# Patient Record
Sex: Female | Born: 1980
Health system: Southern US, Community
[De-identification: ages and names within clinical notes are randomized; demographics above are authoritative.]

## PROBLEM LIST (undated history)

## (undated) ENCOUNTER — Inpatient Hospital Stay (HOSPITAL_COMMUNITY): Payer: Self-pay

## (undated) DIAGNOSIS — B1920 Unspecified viral hepatitis C without hepatic coma: Secondary | ICD-10-CM

## (undated) DIAGNOSIS — O09529 Supervision of elderly multigravida, unspecified trimester: Secondary | ICD-10-CM

## (undated) DIAGNOSIS — J45909 Unspecified asthma, uncomplicated: Secondary | ICD-10-CM

## (undated) DIAGNOSIS — A4902 Methicillin resistant Staphylococcus aureus infection, unspecified site: Secondary | ICD-10-CM

## (undated) DIAGNOSIS — Z954 Presence of other heart-valve replacement: Secondary | ICD-10-CM

## (undated) DIAGNOSIS — S065X9A Traumatic subdural hemorrhage with loss of consciousness of unspecified duration, initial encounter: Secondary | ICD-10-CM

## (undated) DIAGNOSIS — D649 Anemia, unspecified: Secondary | ICD-10-CM

## (undated) DIAGNOSIS — Z3009 Encounter for other general counseling and advice on contraception: Secondary | ICD-10-CM

## (undated) DIAGNOSIS — F1990 Other psychoactive substance use, unspecified, uncomplicated: Secondary | ICD-10-CM

## (undated) DIAGNOSIS — T4145XA Adverse effect of unspecified anesthetic, initial encounter: Secondary | ICD-10-CM

## (undated) DIAGNOSIS — F1120 Opioid dependence, uncomplicated: Secondary | ICD-10-CM

## (undated) HISTORY — DX: Unspecified viral hepatitis C without hepatic coma: B19.20

## (undated) HISTORY — DX: Adverse effect of unspecified anesthetic, initial encounter: T41.45XA

## (undated) HISTORY — PX: CARDIAC SURGERY: SHX584

## (undated) HISTORY — PX: BUNIONECTOMY: SHX129

## (undated) HISTORY — PX: TUBAL LIGATION: SHX77

## (undated) HISTORY — DX: Presence of other heart-valve replacement: Z95.4

## (undated) HISTORY — DX: Methicillin resistant Staphylococcus aureus infection, unspecified site: A49.02

## (undated) HISTORY — DX: Other psychoactive substance use, unspecified, uncomplicated: F19.90

## (undated) HISTORY — DX: Traumatic subdural hemorrhage with loss of consciousness of unspecified duration, initial encounter: S06.5X9A

---
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
## 2004-10-28 ENCOUNTER — Emergency Department: Payer: Self-pay | Admitting: General Practice

## 2005-07-30 ENCOUNTER — Emergency Department: Payer: Self-pay | Admitting: Emergency Medicine

## 2005-08-05 ENCOUNTER — Emergency Department: Payer: Self-pay | Admitting: Emergency Medicine

## 2008-03-17 ENCOUNTER — Emergency Department: Payer: Self-pay | Admitting: Emergency Medicine

## 2008-06-14 ENCOUNTER — Emergency Department: Payer: Self-pay | Admitting: Emergency Medicine

## 2008-06-19 ENCOUNTER — Ambulatory Visit: Payer: Self-pay | Admitting: Unknown Physician Specialty

## 2008-06-25 ENCOUNTER — Ambulatory Visit: Payer: Self-pay | Admitting: Unknown Physician Specialty

## 2008-11-20 ENCOUNTER — Emergency Department: Payer: Self-pay | Admitting: Emergency Medicine

## 2008-12-18 ENCOUNTER — Emergency Department: Payer: Self-pay | Admitting: Emergency Medicine

## 2009-06-15 ENCOUNTER — Emergency Department: Payer: Self-pay | Admitting: Emergency Medicine

## 2009-06-25 ENCOUNTER — Emergency Department: Payer: Self-pay | Admitting: Emergency Medicine

## 2010-10-26 ENCOUNTER — Emergency Department: Payer: Self-pay | Admitting: Internal Medicine

## 2011-02-06 ENCOUNTER — Inpatient Hospital Stay: Payer: Self-pay | Admitting: Psychiatry

## 2011-04-26 ENCOUNTER — Observation Stay: Payer: Self-pay | Admitting: Obstetrics and Gynecology

## 2011-05-05 ENCOUNTER — Ambulatory Visit: Payer: Self-pay

## 2011-05-06 ENCOUNTER — Inpatient Hospital Stay: Payer: Self-pay

## 2011-07-19 ENCOUNTER — Emergency Department: Payer: Self-pay | Admitting: Emergency Medicine

## 2011-10-21 ENCOUNTER — Emergency Department: Payer: Self-pay | Admitting: Emergency Medicine

## 2012-06-06 ENCOUNTER — Emergency Department: Payer: Self-pay | Admitting: Emergency Medicine

## 2012-06-06 LAB — URINALYSIS, COMPLETE
Bacteria: NONE SEEN
Bilirubin,UR: NEGATIVE
Glucose,UR: NEGATIVE mg/dL (ref 0–75)
Ketone: NEGATIVE
Nitrite: NEGATIVE
Ph: 6 (ref 4.5–8.0)
Protein: 30
RBC,UR: 45 /HPF (ref 0–5)
Specific Gravity: 1.023 (ref 1.003–1.030)
Squamous Epithelial: 1
Transitional Epi: <1
WBC UR: 80 /HPF (ref 0–5)

## 2012-06-06 LAB — PREGNANCY, URINE: Pregnancy Test, Urine: NEGATIVE m[IU]/mL

## 2012-06-08 LAB — URINE CULTURE

## 2012-09-11 ENCOUNTER — Emergency Department: Payer: Self-pay | Admitting: Emergency Medicine

## 2012-10-01 ENCOUNTER — Emergency Department: Payer: Self-pay | Admitting: Emergency Medicine

## 2012-12-19 ENCOUNTER — Emergency Department: Payer: Self-pay | Admitting: Emergency Medicine

## 2013-02-16 ENCOUNTER — Emergency Department: Payer: Self-pay | Admitting: Internal Medicine

## 2013-02-16 LAB — URINALYSIS, COMPLETE
Bacteria: NONE SEEN
Bilirubin,UR: NEGATIVE
Glucose,UR: NEGATIVE mg/dL (ref 0–75)
Ketone: NEGATIVE
Leukocyte Esterase: NEGATIVE
Nitrite: NEGATIVE
Ph: 6 (ref 4.5–8.0)
Protein: 30
RBC,UR: 19 /HPF (ref 0–5)
Specific Gravity: 1.024 (ref 1.003–1.030)
Squamous Epithelial: 8
WBC UR: 1 /HPF (ref 0–5)

## 2013-02-16 LAB — CBC
HCT: 46.6 % (ref 35.0–47.0)
HGB: 15.6 g/dL (ref 12.0–16.0)
MCH: 32.9 pg (ref 26.0–34.0)
MCHC: 33.6 g/dL (ref 32.0–36.0)
MCV: 98 fL (ref 80–100)
Platelet: 253 10*3/uL (ref 150–440)
RBC: 4.74 10*6/uL (ref 3.80–5.20)
RDW: 13.9 % (ref 11.5–14.5)
WBC: 13.4 10*3/uL — ABNORMAL HIGH (ref 3.6–11.0)

## 2013-02-16 LAB — HCG, QUANTITATIVE, PREGNANCY: Beta Hcg, Quant.: 206 m[IU]/mL — ABNORMAL HIGH

## 2013-10-24 ENCOUNTER — Encounter (HOSPITAL_COMMUNITY): Payer: Self-pay | Admitting: Emergency Medicine

## 2013-10-24 ENCOUNTER — Emergency Department (INDEPENDENT_AMBULATORY_CARE_PROVIDER_SITE_OTHER)
Admission: EM | Admit: 2013-10-24 | Discharge: 2013-10-24 | Disposition: A | Discharge status: Home / Self Care | Payer: Self-pay | Source: Home / Self Care | Attending: Emergency Medicine | Admitting: Emergency Medicine

## 2013-10-24 DIAGNOSIS — L0291 Cutaneous abscess, unspecified: Secondary | ICD-10-CM

## 2013-10-24 DIAGNOSIS — L0390 Cellulitis, unspecified: Secondary | ICD-10-CM

## 2013-10-24 DIAGNOSIS — N390 Urinary tract infection, site not specified: Secondary | ICD-10-CM

## 2013-10-24 LAB — POCT URINALYSIS DIP (DEVICE)
Bilirubin Urine: NEGATIVE
Glucose, UA: NEGATIVE mg/dL
Ketones, ur: NEGATIVE mg/dL
Nitrite: NEGATIVE
Protein, ur: NEGATIVE mg/dL
Specific Gravity, Urine: 1.025 (ref 1.005–1.030)
Urobilinogen, UA: 0.2 mg/dL (ref 0.0–1.0)
pH: 6.5 (ref 5.0–8.0)

## 2013-10-24 MED ORDER — FLUCONAZOLE 150 MG PO TABS: 150.0000 mg | ORAL_TABLET | Freq: Once | ORAL | Status: DC

## 2013-10-24 MED ORDER — SULFAMETHOXAZOLE-TMP DS 800-160 MG PO TABS: 2.0000 | ORAL_TABLET | Freq: Two times a day (BID) | ORAL | Status: DC

## 2013-10-24 NOTE — ED Notes (Signed)
C/o uti symptoms x 2 days.  Pt is also concerned about a knot on above the right antecubital space.  States that 5 days ago she was injecting cocaine for the first time.  Area is red and swollen, hot to the touch.  Pain/tenderness radiates up arm.

## 2013-10-24 NOTE — Discharge Instructions (Signed)

## 2013-10-24 NOTE — ED Provider Notes (Signed)
Chief Complaint   Chief Complaint  Patient presents with  . Urinary Tract Infection  . Abscess    History of Present Illness   Tiffany Salazar is a 33 year old female who a week ago injected some cocaine into her right antecubital fossa. Ever since then there has been redness, swelling, and pain at the injection site. It has not drained any pus and she's not had any fever or chills. The patient states that she does not usually use cocaine and this was a one-time only thing for her. She has had issues with drug dependence however, and is on Suboxone. The second problem has been a two-day history of urinary frequency and dysuria. She denies any hematuria, back pain, or fever. She has had UTIs before. She denies any GYN complaints. She does not think she needs referral to a drug treatment program.  Review of Systems   Other than as noted above, the patient denies any of the following symptoms: Systemic:  No fever, chills or sweats. Skin:  No rash or itching.  PMFSH   Past medical history, family history, social history, meds, and allergies were reviewed.  No history of diabetes or prior history of abscesses or MRSA.   Physical Examination     Vital signs:  BP 134/69  Pulse 67  Temp(Src) 98.8 F (37.1 C) (Oral)  Resp 16  SpO2 100%  LMP 10/11/2013 Skin:  There is a 2 cm raised, red, tender, fluctuant mass in the right antecubital fossa.  Skin exam was otherwise normal.  No rash. Ext:  Distal pulses were full, patient has full ROM of all joints.   Labs   Results for orders placed during the hospital encounter of 10/24/13  POCT URINALYSIS DIP (DEVICE)      Result Value Ref Range   Glucose, UA NEGATIVE  NEGATIVE mg/dL   Bilirubin Urine NEGATIVE  NEGATIVE   Ketones, ur NEGATIVE  NEGATIVE mg/dL   Specific Gravity, Urine 1.025  1.005 - 1.030   Hgb urine dipstick TRACE (*) NEGATIVE   pH 6.5  5.0 - 8.0   Protein, ur NEGATIVE  NEGATIVE mg/dL   Urobilinogen, UA 0.2  0.0 - 1.0 mg/dL   Nitrite NEGATIVE  NEGATIVE   Leukocytes, UA SMALL (*) NEGATIVE   Urine was cultured.  Results are pending at this time and we will call about any positive results.  Procedure   Verbal informed consent was obtained.  The patient was informed of the risks and benefits of the procedure and understands and accepts.  A time out was called and the identity of the patient and correct procedure was confirmed.   The abscess area described above was prepped with Betadine and alcohol and anesthetized with ethyl chloride spray than 5 mL of 2% Xylocaine with epinephrine.  Using a #11 scalpel blade, a singe straight incision was made into the area of fluctulence, yielding a moderate amount of prurulent drainage.  Routine cultures were obtained.  Blunt dissection was used to break up loculations and the resulting wound cavity was packed with 1/4 inch Iodoform gauze.  A sterile pressure dressing was applied.  Assessment   The primary encounter diagnosis was Abscess. A diagnosis of UTI (lower urinary tract infection) was also pertinent to this visit.  Plan     1.  Meds:  The following meds were prescribed:   Discharge Medication List as of 10/24/2013  9:07 PM    START taking these medications   Details  fluconazole (DIFLUCAN) 150 MG tablet  Take 1 tablet (150 mg total) by mouth once., Starting 10/24/2013, Normal    sulfamethoxazole-trimethoprim (BACTRIM DS) 800-160 MG per tablet Take 2 tablets by mouth 2 (two) times daily., Starting 10/24/2013, Until Discontinued, Normal        2.  Patient Education/Counseling:  The patient was given appropriate handouts, self care instructions, and instructed in symptomatic relief.    3.  Follow up:  The patient was instructed to leave the dressing in place and return here again in 48 hours for packing removal, or sooner if becoming worse in any way, and given some red flag symptoms such as fever which would prompt immediate return.       Reuben Likes, MD 10/24/13  [id number]

## 2013-10-26 ENCOUNTER — Encounter (HOSPITAL_COMMUNITY): Payer: Self-pay | Admitting: Emergency Medicine

## 2013-10-26 ENCOUNTER — Emergency Department (INDEPENDENT_AMBULATORY_CARE_PROVIDER_SITE_OTHER)
Admission: EM | Admit: 2013-10-26 | Discharge: 2013-10-26 | Disposition: A | Discharge status: Home / Self Care | Payer: Self-pay | Source: Home / Self Care | Attending: Family Medicine | Admitting: Family Medicine

## 2013-10-26 DIAGNOSIS — L02419 Cutaneous abscess of limb, unspecified: Secondary | ICD-10-CM

## 2013-10-26 DIAGNOSIS — IMO0002 Reserved for concepts with insufficient information to code with codable children: Secondary | ICD-10-CM

## 2013-10-26 MED ORDER — IBUPROFEN 800 MG PO TABS
800.0000 mg | ORAL_TABLET | Freq: Once | ORAL | Status: AC
  Administered 2013-10-26: 800 mg via ORAL

## 2013-10-26 MED ORDER — IBUPROFEN 800 MG PO TABS
ORAL_TABLET | ORAL | Status: AC
  Filled 2013-10-26: qty 1

## 2013-10-26 NOTE — ED Provider Notes (Signed)
Tiffany Salazar is a 33 y.o. female who presents to Urgent Care today for abscess followup. Patient was seen on March 2 for right antecubital fossa abscess. This results in about one week after an attempted IV cocaine use. She was seen and had incision drainage packing and culture of the abscess. She was placed on empiric Bactrim antibiotics. She's here today notes that she feels somewhat better. No fevers or chills nausea vomiting or diarrhea. No further cocaine use.   History reviewed. No pertinent past medical history. History  Substance Use Topics  . Smoking status: Current Every Day Smoker -- 1.00 packs/day    Types: Cigarettes  . Smokeless tobacco: Not on file  . Alcohol Use: Yes   ROS as above Medications: No current facility-administered medications for this encounter.   Current Outpatient Prescriptions  Medication Sig Dispense Refill  . fluconazole (DIFLUCAN) 150 MG tablet Take 1 tablet (150 mg total) by mouth once.  1 tablet  0  . sulfamethoxazole-trimethoprim (BACTRIM DS) 800-160 MG per tablet Take 2 tablets by mouth 2 (two) times daily.  40 tablet  0    Exam:  BP 100/53  Pulse 70  Temp(Src) 97.7 F (36.5 C)  Resp 14  SpO2 100%  LMP 10/11/2013 Gen: Well NAD SKIN: Well appearing abscess with intact packing. Minimal surrounding erythema or induration. No expressible pus. The packing was removed. No streaking erythema.   Assessment and Plan: 33 y.o. female with followup abscess. Appears to be doing well. Not currently fully resolved. Plan to continue Bactrim antibiotics. Awaiting culture results. No growth to date.  Discussed warning signs or symptoms. Please see discharge instructions. Patient expresses understanding.    Rodolph BongEvan S Macarius Ruark, MD 10/26/13 [phone]

## 2013-10-26 NOTE — Discharge Instructions (Signed)
Thank you for coming in today. Continue the antibiotics until they run out. In 2 days if not better please return. If getting worse go to the emergency room. If getting dramatically better or you do not need to come back.  Please do not inject drugs into your body anymore.  Abscess An abscess is an infected area that contains a collection of pus and debris.It can occur in almost any part of the body. An abscess is also known as a furuncle or boil. CAUSES  An abscess occurs when tissue gets infected. This can occur from blockage of oil or sweat glands, infection of hair follicles, or a minor injury to the skin. As the body tries to fight the infection, pus collects in the area and creates pressure under the skin. This pressure causes pain. People with weakened immune systems have difficulty fighting infections and get certain abscesses more often.  SYMPTOMS Usually an abscess develops on the skin and becomes a painful mass that is red, warm, and tender. If the abscess forms under the skin, you may feel a moveable soft area under the skin. Some abscesses break open (rupture) on their own, but most will continue to get worse without care. The infection can spread deeper into the body and eventually into the bloodstream, causing you to feel ill.  DIAGNOSIS  Your caregiver will take your medical history and perform a physical exam. A sample of fluid may also be taken from the abscess to determine what is causing your infection. TREATMENT  Your caregiver may prescribe antibiotic medicines to fight the infection. However, taking antibiotics alone usually does not cure an abscess. Your caregiver may need to make a small cut (incision) in the abscess to drain the pus. In some cases, gauze is packed into the abscess to reduce pain and to continue draining the area. HOME CARE INSTRUCTIONS   Only take over-the-counter or prescription medicines for pain, discomfort, or fever as directed by your  caregiver.  If you were prescribed antibiotics, take them as directed. Finish them even if you start to feel better.  If gauze is used, follow your caregiver's directions for changing the gauze.  To avoid spreading the infection:  Keep your draining abscess covered with a bandage.  Wash your hands well.  Do not share personal care items, towels, or whirlpools with others.  Avoid skin contact with others.  Keep your skin and clothes clean around the abscess.  Keep all follow-up appointments as directed by your caregiver. SEEK MEDICAL CARE IF:   You have increased pain, swelling, redness, fluid drainage, or bleeding.  You have muscle aches, chills, or a general ill feeling.  You have a fever. MAKE SURE YOU:   Understand these instructions.  Will watch your condition.  Will get help right away if you are not doing well or get worse. Document Released: 05/21/2005 Document Revised: 02/10/2012 Document Reviewed: 10/24/2011 [facility] Patient Information 2014 [place], [place].

## 2013-10-26 NOTE — ED Notes (Signed)
Recheck of wound right AC area, had I&D of abscess. Pt right handed , and reportedly had [facility] , resulting in infection. Area remains swollen, packing in place, no drainage at present

## 2013-10-31 LAB — CULTURE, ROUTINE-ABSCESS
Gram Stain: NONE SEEN
Special Requests: NORMAL

## 2013-12-26 ENCOUNTER — Emergency Department: Payer: Self-pay | Admitting: Emergency Medicine

## 2013-12-26 ENCOUNTER — Encounter (HOSPITAL_COMMUNITY): Payer: Self-pay | Admitting: Emergency Medicine

## 2013-12-26 ENCOUNTER — Inpatient Hospital Stay (HOSPITAL_COMMUNITY)
Admission: EM | Admit: 2013-12-26 | Discharge: 2013-12-27 | DRG: 963 | Discharge status: Against Medical Advice | Payer: Self-pay | Attending: General Surgery | Admitting: General Surgery

## 2013-12-26 DIAGNOSIS — S065X9A Traumatic subdural hemorrhage with loss of consciousness of unspecified duration, initial encounter: Secondary | ICD-10-CM

## 2013-12-26 DIAGNOSIS — S065XAA Traumatic subdural hemorrhage with loss of consciousness status unknown, initial encounter: Secondary | ICD-10-CM

## 2013-12-26 DIAGNOSIS — R4182 Altered mental status, unspecified: Secondary | ICD-10-CM

## 2013-12-26 DIAGNOSIS — IMO0002 Reserved for concepts with insufficient information to code with codable children: Secondary | ICD-10-CM

## 2013-12-26 DIAGNOSIS — F1410 Cocaine abuse, uncomplicated: Secondary | ICD-10-CM | POA: Diagnosis present

## 2013-12-26 DIAGNOSIS — F1990 Other psychoactive substance use, unspecified, uncomplicated: Secondary | ICD-10-CM | POA: Diagnosis present

## 2013-12-26 DIAGNOSIS — J9383 Other pneumothorax: Secondary | ICD-10-CM | POA: Diagnosis present

## 2013-12-26 DIAGNOSIS — S3600XA Unspecified injury of spleen, initial encounter: Secondary | ICD-10-CM | POA: Diagnosis present

## 2013-12-26 DIAGNOSIS — T07XXXA Unspecified multiple injuries, initial encounter: Secondary | ICD-10-CM | POA: Diagnosis present

## 2013-12-26 DIAGNOSIS — F1210 Cannabis abuse, uncomplicated: Secondary | ICD-10-CM | POA: Diagnosis present

## 2013-12-26 DIAGNOSIS — F1910 Other psychoactive substance abuse, uncomplicated: Secondary | ICD-10-CM

## 2013-12-26 DIAGNOSIS — G9340 Encephalopathy, unspecified: Secondary | ICD-10-CM | POA: Diagnosis present

## 2013-12-26 DIAGNOSIS — S20219A Contusion of unspecified front wall of thorax, initial encounter: Secondary | ICD-10-CM | POA: Diagnosis present

## 2013-12-26 DIAGNOSIS — F17200 Nicotine dependence, unspecified, uncomplicated: Secondary | ICD-10-CM | POA: Diagnosis present

## 2013-12-26 DIAGNOSIS — I514 Myocarditis, unspecified: Secondary | ICD-10-CM | POA: Diagnosis present

## 2013-12-26 DIAGNOSIS — S2691XA Contusion of heart, unspecified with or without hemopericardium, initial encounter: Secondary | ICD-10-CM | POA: Diagnosis present

## 2013-12-26 DIAGNOSIS — T148XXA Other injury of unspecified body region, initial encounter: Secondary | ICD-10-CM

## 2013-12-26 HISTORY — DX: Traumatic subdural hemorrhage with loss of consciousness status unknown, initial encounter: S06.5XAA

## 2013-12-26 HISTORY — DX: Traumatic subdural hemorrhage with loss of consciousness of unspecified duration, initial encounter: S06.5X9A

## 2013-12-26 LAB — URINALYSIS, COMPLETE
Bacteria: NONE SEEN
Bilirubin,UR: NEGATIVE
Glucose,UR: NEGATIVE mg/dL (ref 0–75)
Ketone: NEGATIVE
Leukocyte Esterase: NEGATIVE
Nitrite: NEGATIVE
Ph: 8 (ref 4.5–8.0)
Protein: NEGATIVE
RBC,UR: 16 /HPF (ref 0–5)
Specific Gravity: 1.055 (ref 1.003–1.030)
Squamous Epithelial: 2
WBC UR: NONE SEEN /HPF (ref 0–5)

## 2013-12-26 LAB — CBC WITH DIFFERENTIAL/PLATELET
Basophils Absolute: 0 10*3/uL (ref 0.0–0.1)
Basophils Relative: 0 % (ref 0–1)
Eosinophils Absolute: 0 10*3/uL (ref 0.0–0.7)
Eosinophils Relative: 0 % (ref 0–5)
HCT: 43.7 % (ref 36.0–46.0)
Hemoglobin: 14.3 g/dL (ref 12.0–15.0)
Lymphocytes Relative: 12 % (ref 12–46)
Lymphs Abs: 1.7 10*3/uL (ref 0.7–4.0)
MCH: 32.2 pg (ref 26.0–34.0)
MCHC: 32.7 g/dL (ref 30.0–36.0)
MCV: 98.4 fL (ref 78.0–100.0)
Monocytes Absolute: 0.6 10*3/uL (ref 0.1–1.0)
Monocytes Relative: 4 % (ref 3–12)
Neutro Abs: 11.5 10*3/uL — ABNORMAL HIGH (ref 1.7–7.7)
Neutrophils Relative %: 84 % — ABNORMAL HIGH (ref 43–77)
Platelets: 178 10*3/uL (ref 150–400)
RBC: 4.44 MIL/uL (ref 3.87–5.11)
RDW: 14.2 % (ref 11.5–15.5)
WBC: 13.8 10*3/uL — ABNORMAL HIGH (ref 4.0–10.5)

## 2013-12-26 LAB — COMPREHENSIVE METABOLIC PANEL
ALT: 23 U/L (ref 0–35)
AST: 29 U/L (ref 0–37)
Albumin: 3.5 g/dL (ref 3.4–5.0)
Albumin: 3.7 g/dL (ref 3.5–5.2)
Alkaline Phosphatase: 74 U/L
Alkaline Phosphatase: 67 U/L (ref 39–117)
Anion Gap: 5 — ABNORMAL LOW (ref 7–16)
BUN: 16 mg/dL (ref 7–18)
BUN: 13 mg/dL (ref 6–23)
Bilirubin,Total: 0.2 mg/dL (ref 0.2–1.0)
CO2: 23 mEq/L (ref 19–32)
Calcium, Total: 8.6 mg/dL (ref 8.5–10.1)
Calcium: 9.1 mg/dL (ref 8.4–10.5)
Chloride: 108 mmol/L — ABNORMAL HIGH (ref 98–107)
Chloride: 102 mEq/L (ref 96–112)
Co2: 28 mmol/L (ref 21–32)
Creatinine, Ser: 0.79 mg/dL (ref 0.50–1.10)
Creatinine: 1.08 mg/dL (ref 0.60–1.30)
EGFR (African American): >60
EGFR (Non-African Amer.): >60
GFR calc Af Amer: >90 mL/min (ref >90)
GFR calc non Af Amer: >90 mL/min (ref >90)
Glucose, Bld: 126 mg/dL — ABNORMAL HIGH (ref 70–99)
Glucose: 116 mg/dL — ABNORMAL HIGH (ref 65–99)
Osmolality: 283 (ref 275–301)
Potassium: 4 mmol/L (ref 3.5–5.1)
Potassium: 3.9 mEq/L (ref 3.7–5.3)
SGOT(AST): 39 U/L — ABNORMAL HIGH (ref 15–37)
SGPT (ALT): 30 U/L (ref 12–78)
Sodium: 141 mmol/L (ref 136–145)
Sodium: 140 mEq/L (ref 137–147)
Total Bilirubin: 0.3 mg/dL (ref 0.3–1.2)
Total Protein: 6.9 g/dL (ref 6.4–8.2)
Total Protein: 7.1 g/dL (ref 6.0–8.3)

## 2013-12-26 LAB — CBC
HCT: 43.3 % (ref 35.0–47.0)
HGB: 13.8 g/dL (ref 12.0–16.0)
MCH: 31.6 pg (ref 26.0–34.0)
MCHC: 31.9 g/dL — ABNORMAL LOW (ref 32.0–36.0)
MCV: 99 fL (ref 80–100)
Platelet: 172 10*3/uL (ref 150–440)
RBC: 4.38 10*6/uL (ref 3.80–5.20)
RDW: 14.6 % — ABNORMAL HIGH (ref 11.5–14.5)
WBC: 12.1 10*3/uL — ABNORMAL HIGH (ref 3.6–11.0)

## 2013-12-26 LAB — URINALYSIS, ROUTINE W REFLEX MICROSCOPIC
Bilirubin Urine: NEGATIVE
Glucose, UA: NEGATIVE mg/dL
Ketones, ur: NEGATIVE mg/dL
Leukocytes, UA: NEGATIVE
Nitrite: NEGATIVE
Protein, ur: NEGATIVE mg/dL
Specific Gravity, Urine: 1.01 (ref 1.005–1.030)
Urobilinogen, UA: 0.2 mg/dL (ref 0.0–1.0)
pH: 8.5 — ABNORMAL HIGH (ref 5.0–8.0)

## 2013-12-26 LAB — RAPID URINE DRUG SCREEN, HOSP PERFORMED
Amphetamines: NOT DETECTED
Barbiturates: NOT DETECTED
Benzodiazepines: NOT DETECTED
Cocaine: POSITIVE — AB
Opiates: POSITIVE — AB
Tetrahydrocannabinol: POSITIVE — AB

## 2013-12-26 LAB — ETHANOL
Ethanol %: <0.003 % (ref 0.000–0.080)
Ethanol: <3 mg/dL

## 2013-12-26 LAB — HCG, QUANTITATIVE, PREGNANCY: Beta Hcg, Quant.: <1 m[IU]/mL — ABNORMAL LOW

## 2013-12-26 LAB — DRUG SCREEN, URINE
Amphetamines, Ur Screen: NEGATIVE (ref ?–1000)
Barbiturates, Ur Screen: NEGATIVE (ref ?–200)
Benzodiazepine, Ur Scrn: NEGATIVE (ref ?–200)
Cannabinoid 50 Ng, Ur ~~LOC~~: POSITIVE (ref ?–50)
Cocaine Metabolite,Ur ~~LOC~~: POSITIVE (ref ?–300)
MDMA (Ecstasy)Ur Screen: NEGATIVE (ref ?–500)
Methadone, Ur Screen: NEGATIVE (ref ?–300)
Opiate, Ur Screen: POSITIVE (ref ?–300)
Phencyclidine (PCP) Ur S: NEGATIVE (ref ?–25)
Tricyclic, Ur Screen: NEGATIVE (ref ?–1000)

## 2013-12-26 LAB — MRSA PCR SCREENING: MRSA by PCR: NEGATIVE

## 2013-12-26 LAB — URINE MICROSCOPIC-ADD ON

## 2013-12-26 LAB — I-STAT CG4 LACTIC ACID, ED: Lactic Acid, Venous: 1.97 mmol/L (ref 0.5–2.2)

## 2013-12-26 LAB — POC URINE PREG, ED: Preg Test, Ur: NEGATIVE

## 2013-12-26 MED ORDER — BISACODYL 10 MG RE SUPP: 10.0000 mg | Freq: Every day | RECTAL | Status: DC | PRN

## 2013-12-26 MED ORDER — ONDANSETRON HCL 4 MG PO TABS: 4.0000 mg | ORAL_TABLET | Freq: Four times a day (QID) | ORAL | Status: DC | PRN

## 2013-12-26 MED ORDER — POTASSIUM CHLORIDE IN NACL 20-0.9 MEQ/L-% IV SOLN
INTRAVENOUS | Status: DC
  Administered 2013-12-26: 16:00:00 via INTRAVENOUS
  Filled 2013-12-26 (×3): qty 1000

## 2013-12-26 MED ORDER — FENTANYL CITRATE 0.05 MG/ML IJ SOLN
50.0000 ug | Freq: Once | INTRAMUSCULAR | Status: AC
  Administered 2013-12-26: 50 ug via INTRAVENOUS
  Filled 2013-12-26: qty 2

## 2013-12-26 MED ORDER — ONDANSETRON HCL 4 MG/2ML IJ SOLN: 4.0000 mg | Freq: Four times a day (QID) | INTRAMUSCULAR | Status: DC | PRN

## 2013-12-26 MED ORDER — LACTATED RINGERS IV BOLUS (SEPSIS)
1000.0000 mL | Freq: Once | INTRAVENOUS | Status: AC
  Administered 2013-12-26: 1000 mL via INTRAVENOUS

## 2013-12-26 MED ORDER — METHOCARBAMOL 750 MG PO TABS
1500.0000 mg | ORAL_TABLET | Freq: Four times a day (QID) | ORAL | Status: DC
  Administered 2013-12-26 – 2013-12-27 (×3): 1500 mg via ORAL
  Filled 2013-12-26 (×7): qty 2

## 2013-12-26 MED ORDER — FENTANYL CITRATE 0.05 MG/ML IJ SOLN
50.0000 ug | INTRAMUSCULAR | Status: DC | PRN
  Administered 2013-12-26 – 2013-12-27 (×3): 50 ug via INTRAVENOUS
  Filled 2013-12-26 (×3): qty 2

## 2013-12-26 MED ORDER — DOCUSATE SODIUM 100 MG PO CAPS
100.0000 mg | ORAL_CAPSULE | Freq: Two times a day (BID) | ORAL | Status: DC
  Administered 2013-12-26 – 2013-12-27 (×2): 100 mg via ORAL
  Filled 2013-12-26 (×5): qty 1

## 2013-12-26 NOTE — ED Notes (Signed)
Surgery PA at bedside.  

## 2013-12-26 NOTE — ED Notes (Signed)
Lactic acid results called to primary nurse Thayer Ohmhris on Pod A
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
## 2013-12-26 NOTE — ED Notes (Signed)
Dr. Nanivati at bedside  
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
## 2013-12-26 NOTE — ED Notes (Signed)
Pt refusing to wear collar, Pt took collar off herself.
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
## 2013-12-26 NOTE — H&P (Signed)
I have seen and examined the patient and agree with the assessment and plans. Will consult neurosurg  Binyamin Nelis A. Magnus IvanBlackman  MD, FACS
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
## 2013-12-27 ENCOUNTER — Observation Stay (HOSPITAL_COMMUNITY): Payer: Self-pay

## 2013-12-27 LAB — COMPREHENSIVE METABOLIC PANEL
ALT: 18 U/L (ref 0–35)
AST: 19 U/L (ref 0–37)
Albumin: 3.4 g/dL — ABNORMAL LOW (ref 3.5–5.2)
Alkaline Phosphatase: 66 U/L (ref 39–117)
BUN: 7 mg/dL (ref 6–23)
CO2: 24 mEq/L (ref 19–32)
Calcium: 8.8 mg/dL (ref 8.4–10.5)
Chloride: 108 mEq/L (ref 96–112)
Creatinine, Ser: 0.73 mg/dL (ref 0.50–1.10)
GFR calc Af Amer: >90 mL/min (ref >90)
GFR calc non Af Amer: >90 mL/min (ref >90)
Glucose, Bld: 114 mg/dL — ABNORMAL HIGH (ref 70–99)
Potassium: 4.1 mEq/L (ref 3.7–5.3)
Sodium: 144 mEq/L (ref 137–147)
Total Bilirubin: 0.5 mg/dL (ref 0.3–1.2)
Total Protein: 6.8 g/dL (ref 6.0–8.3)

## 2013-12-27 LAB — CBC
HCT: 40.5 % (ref 36.0–46.0)
Hemoglobin: 13.5 g/dL (ref 12.0–15.0)
MCH: 32.1 pg (ref 26.0–34.0)
MCHC: 33.3 g/dL (ref 30.0–36.0)
MCV: 96.2 fL (ref 78.0–100.0)
Platelets: 169 10*3/uL (ref 150–400)
RBC: 4.21 MIL/uL (ref 3.87–5.11)
RDW: 14.4 % (ref 11.5–15.5)
WBC: 11 10*3/uL — ABNORMAL HIGH (ref 4.0–10.5)

## 2013-12-27 MED ORDER — MUPIROCIN 2 % EX OINT
TOPICAL_OINTMENT | CUTANEOUS | Status: AC
  Filled 2013-12-27: qty 22

## 2013-12-27 MED ORDER — OXYCODONE-ACETAMINOPHEN 5-325 MG PO TABS
1.0000 | ORAL_TABLET | ORAL | Status: DC | PRN
  Administered 2013-12-27: 1 via ORAL
  Filled 2013-12-27: qty 1

## 2013-12-27 NOTE — Progress Notes (Signed)
Trauma Service Note  Subjective: Patient conversant and wants to consider some type of drug rehab program.  Objective: Vital signs in last 24 hours: Temp:  [97.9 F (36.6 C)-99.5 F (37.5 C)] 98.3 F (36.8 C) (05/05 0700) Pulse Rate:  [51-85] 51 (05/05 0700) Resp:  [12-34] 22 (05/05 0700) BP: (112-142)/(59-106) 119/77 mmHg (05/05 0700) SpO2:  [95 %-100 %] 95 % (05/05 0700) Weight:  [59.1 kg (130 lb 4.7 oz)] 59.1 kg (130 lb 4.7 oz) (05/04 1500)    Intake/Output from previous day: 05/04 0701 - 05/05 0700 In: 978.8 [I.V.:978.8] Out: -  Intake/Output this shift:    General: No acute distress  Lungs: Clear to auscultation  Abd: Benign  Extremities: No changes  Neuro: Intact  Lab Results: CBC   Recent Labs  12/26/13 0918 12/27/13 0210  WBC 13.8* 11.0*  HGB 14.3 13.5  HCT 43.7 40.5  PLT 178 169   BMET  Recent Labs  12/26/13 0918 12/27/13 0210  NA 140 144  K 3.9 4.1  CL 102 108  CO2 23 24  GLUCOSE 126* 114*  BUN 13 7  CREATININE 0.79 0.73  CALCIUM 9.1 8.8   PT/INR No results found for this basename: LABPROT, INR,  in the last 72 hours ABG No results found for this basename: PHART, PCO2, PO2, HCO3,  in the last 72 hours  Studies/Results: Ct Head Wo Contrast  12/27/2013   CLINICAL DATA:  Recheck subdural hematoma.  EXAM: CT HEAD WITHOUT CONTRAST  TECHNIQUE: Contiguous axial images were obtained from the base of the skull through the vertex without intravenous contrast.  COMPARISON:  12/26/2013  FINDINGS: Re- demonstrated chronic lucency in the right petrous apex, likely arrested development of air cells. Adenoid tonsil enlargement, usually reactive. No calvarial fracture. Previously seen left tentorial subdural hematoma is less visible, consistent with clearing. No acute hemorrhage, large territory infarct, hydrocephalus, mass lesion, are shift.  IMPRESSION: Clearing left tentorial subdural hematoma.  No acute changes.   Electronically Signed   By: Tiburcio PeaJonathan   Watts M.D.   On: 12/27/2013 05:03    Anti-infectives: Anti-infectives   None      Assessment/Plan: s/p  Advance diet Transfer out of the unit Will have our social worker talk with the patient about outpatient drug rehab programs.  LOS: 1 day   Marta LamasJames O. Gae BonWyatt, III, MD, FACS [phone] Trauma Surgeon 12/27/2013

## 2013-12-27 NOTE — Progress Notes (Signed)
Stable overnight. Patient denies headache this morning. States that she is hungry.  Afebrile. Vital stable. Awake hour. Mostly appropriate. Cranial nerve function intact. Motor and sensory function intact in both upper and lower extremities.  Followup head CT scan with minimal if any left-sided residual tentorial subdural hematoma. No other issues visualized on the scan.  Status post mild closed head injury. Patient may be mobilized without restriction. Okay for discharge from my standpoint.

## 2013-12-27 NOTE — Progress Notes (Signed)
UR Completed.  Tiffany Salazar [name] [phone] 12/27/2013  

## 2013-12-27 NOTE — Progress Notes (Signed)
Patient unwilling to wait for transfer and eventual discharge orders.  Tiffany Salazar, CSW, as well as Tiffany Salazar, AD, have spoken with patient regarding need to wait for orders.  Patient heard advisement but still would like to leave.  She has signed AMA form and is leaving unit.   Tiffany Salazar

## 2013-12-30 DIAGNOSIS — F1990 Other psychoactive substance use, unspecified, uncomplicated: Secondary | ICD-10-CM | POA: Diagnosis present
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
## 2013-12-30 NOTE — Discharge Summary (Signed)
This patient left the hospital against medical advice early in that she would have been likely discharged later that day.  This patient has been seen and I agree with the findings and treatment plan.  Marta LamasJames O. Gae BonWyatt, III, MD, FACS [phone] (pager) [phone] (direct pager) Trauma Surgeon

## 2014-02-07 ENCOUNTER — Emergency Department (INDEPENDENT_AMBULATORY_CARE_PROVIDER_SITE_OTHER)
Admission: EM | Admit: 2014-02-07 | Discharge: 2014-02-07 | Disposition: A | Discharge status: Home / Self Care | Payer: Self-pay | Source: Home / Self Care

## 2014-02-07 ENCOUNTER — Encounter (HOSPITAL_COMMUNITY): Payer: Self-pay | Admitting: Emergency Medicine

## 2014-02-07 DIAGNOSIS — L0100 Impetigo, unspecified: Secondary | ICD-10-CM

## 2014-02-07 DIAGNOSIS — D899 Disorder involving the immune mechanism, unspecified: Secondary | ICD-10-CM

## 2014-02-07 DIAGNOSIS — F1110 Opioid abuse, uncomplicated: Secondary | ICD-10-CM

## 2014-02-07 DIAGNOSIS — F1410 Cocaine abuse, uncomplicated: Secondary | ICD-10-CM

## 2014-02-07 DIAGNOSIS — L0889 Other specified local infections of the skin and subcutaneous tissue: Secondary | ICD-10-CM

## 2014-02-07 MED ORDER — PERMETHRIN 5 % EX CREA: TOPICAL_CREAM | CUTANEOUS | Status: DC

## 2014-02-07 MED ORDER — CEPHALEXIN 500 MG PO CAPS: 500.0000 mg | ORAL_CAPSULE | Freq: Four times a day (QID) | ORAL | Status: DC

## 2014-02-07 MED ORDER — MUPIROCIN CALCIUM 2 % EX CREA: 1.0000 "application " | TOPICAL_CREAM | Freq: Two times a day (BID) | CUTANEOUS | Status: DC

## 2014-02-07 MED ORDER — FLUCONAZOLE 150 MG PO TABS
ORAL_TABLET | ORAL | Status: DC
Start: 2014-02-07 — End: 2014-03-31

## 2014-02-07 NOTE — ED Provider Notes (Signed)
CSN: [id number]     Arrival date & time 02/07/14  1017 History   First MD Initiated Contact with Patient 02/07/14 1049     Chief Complaint  Patient presents with  . Rash   (Consider location/radiation/quality/duration/timing/severity/associated sxs/prior Treatment) HPI Comments: [age] -year- female presents with several "sores" on her extremities. They started approximately one week ago. She believes that some of the lesions may be caused by scabies. Her lifestyle of chronic and recurrent drug use of cocaine and heroine supports poor hygiene, poor living conditions and susceptibility to infections. She believes she has seen bugs crawling under her skin as well as what she believed to be a tick in one area. She has pulled off several scabs and placed him in a plastic bag. She states these are probably some type of bug. She has continued to squeeze her lesions and scratched them and not allowing it to heal. In addition she further contaminates the wounds.    History reviewed. No pertinent past medical history. Past Surgical History  Procedure Laterality Date  . Cesarean section  x 2   No family history on file. History  Substance Use Topics  . Smoking status: Current Every Day Smoker -- 1.00 packs/day    Types: Cigarettes  . Smokeless tobacco: Not on file  . Alcohol Use: Yes   OB History   Grav Para Term Preterm Abortions TAB SAB Ect Mult Living                 Review of Systems  Constitutional: Negative for fever, chills and fatigue.  HENT: Negative.   Respiratory: Negative.   Gastrointestinal: Negative.   Musculoskeletal: Negative.   Skin: Positive for rash and wound.  Neurological: Negative.   Psychiatric/Behavioral: Positive for self-injury and agitation. The patient is hyperactive.     Allergies  Review of patient's allergies indicates no known allergies.  Home Medications   Prior to Admission medications   Medication Sig Start Date End Date Taking? Authorizing Provider   cephALEXin (KEFLEX) 500 MG capsule Take 1 capsule (500 mg total) by mouth 4 (four) times daily. 02/07/14   Hayden Rasmussenavid Mabe, NP  mupirocin cream (BACTROBAN) 2 % Apply 1 application topically 2 (two) times daily. Apply to sores on the skin tid 02/07/14   Hayden Rasmussenavid Mabe, NP  permethrin (ELIMITE) 5 % cream Apply to affected areas from face to feet. Rinse in 8 hours. Do not get into eyes. 02/07/14   Hayden Rasmussenavid Mabe, NP   BP 122/82  Pulse 86  Temp(Src) 98.4 F (36.9 C) (Oral)  Resp 14  SpO2 100%  LMP 01/16/2014 Physical Exam  Nursing note and vitals reviewed. Constitutional: No distress.  Thin young female with pale skin. Agitated with rapid pressured speech. Does not appear acutely ill.   Eyes: EOM are normal.  Neck: Normal range of motion. Neck supple.  Cardiovascular: Normal rate.   Pulmonary/Chest: Effort normal. No respiratory distress.  Musculoskeletal: She exhibits no edema and no tenderness.  Neurological: She is alert. She exhibits normal muscle tone.  Skin: Skin is warm and dry.  There are 3 similar lesions to the L proximal inner thigh. Annular, approx 3 mm in diameter, crater-like and healing by secondary retention. Evidence of scratching and manipulation. Multiple skin lesions to the face and upper extremities i various stages of healing, Some moist and open, some scaling and scabbing. Various areas of erythema. These occurs as solitary lesions and others in short and long streaky/linear patterns following venous anatomy.  Psychiatric: Her  mood appears anxious. Her affect is labile. Her speech is rapid and/or pressured and tangential. She is agitated. Thought content is paranoid.    ED Course  Procedures (including critical care time) Labs Review Labs Reviewed - No data to display  Imaging Review No results found.   MDM   1. Other specified local infections of skin and subcutaneous tissue   2. Impetigo   3. Drug abuse, opioid type   4. Drug abuse, cocaine type   5. Disorder of immune  system     Use your meds as instructed several times Do not pick or squeeze the lesions Soap and water cleansing bid Mupirocin cream, keflex and Elimite (risk for scabies, some lesions are consistent)      Hayden Rasmussenavid Mabe, NP 02/07/14 1124

## 2014-02-07 NOTE — Discharge Instructions (Signed)
Impetigo Impetigo is an infection of the skin, most common in babies and children.  CAUSES  It is caused by staphylococcal or streptococcal germs (bacteria). Impetigo can start after any damage to the skin. The damage to the skin may be from things like:   Chickenpox.  Scrapes.  Scratches.  Insect bites (common when children scratch the bite).  Cuts.  Nail biting or chewing. Impetigo is contagious. It can be spread from one person to another. Avoid close skin contact, or sharing towels or clothing. SYMPTOMS  Impetigo usually starts out as small blisters or pustules. Then they turn into tiny yellow-crusted sores (lesions).  There may also be:  Large blisters.  Itching or pain.  Pus.  Swollen lymph glands. With scratching, irritation, or non-treatment, these small areas may get larger. Scratching can cause the germs to get under the fingernails; then scratching another part of the skin can cause the infection to be spread there. DIAGNOSIS  Diagnosis of impetigo is usually made by a physical exam. A skin culture (test to grow bacteria) may be done to prove the diagnosis or to help decide the best treatment.  TREATMENT  Mild impetigo can be treated with prescription antibiotic cream. Oral antibiotic medicine may be used in more severe cases. Medicines for itching may be used. HOME CARE INSTRUCTIONS   To avoid spreading impetigo to other body areas:  Keep fingernails short and clean.  Avoid scratching.  Cover infected areas if necessary to keep from scratching.  Gently wash the infected areas with antibiotic soap and water.  Soak crusted areas in warm soapy water using antibiotic soap.  Gently rub the areas to remove crusts. Do not scrub.  Wash hands often to avoid spread this infection.  Keep children with impetigo home from school or daycare until they have used an antibiotic cream for 48 hours (2 days) or oral antibiotic medicine for 24 hours (1 day), and their skin  shows significant improvement.  Children may attend school or daycare if they only have a few sores and if the sores can be covered by a bandage or clothing. SEEK MEDICAL CARE IF:   More blisters or sores show up despite treatment.  Other family members get sores.  Rash is not improving after 48 hours (2 days) of treatment. SEEK IMMEDIATE MEDICAL CARE IF:   You see spreading redness or swelling of the skin around the sores.  You see red streaks coming from the sores.  Your child develops a fever of 100.4 F (37.2 C) or higher.  Your child develops a sore throat.  Your child is acting ill (lethargic, sick to their stomach). Document Released: 08/08/2000 Document Revised: 11/03/2011 Document Reviewed: 06/07/2008 [facility] Patient Information 2014 [place], [place].  Opiate Dependence The above names are all different names used for opiates. Opiates are any medication made from the poppy plant. It is a medication which produces a calming, sleepy effect. Because achieving this effect requires more and more of this drug to get the same result, opiates become addictive. A family history of addiction or an addictive personality increases the risk. When drug use is interfering with normal living activities, it has become abuse. This includes problems with family, friends, and your job. Psychological dependence has developed when your mind tells you that the drug is needed. This emotional dependence is the craving for the "high" that some drugs cause. Emotional addicts always want this high instead of the way they are feeling when not using the drug. This is difficult  to overcome. This is usually followed by physical dependence that has developed when continuing increases of drug are required to get the same feeling or "high". This is known as addiction or chemical dependency.  SIGNS OF CHEMICAL DEPENDENCY  Friends and family tell you there is a problem.  Fighting when using drugs.  Mood  swings and insomnia.  Forgetfulness.  Not remembering what you do while using (blackouts).  Feeling sick from using drugs but you continue using.  Lie about use or amounts of drugs (chemicals) used.  Need chemicals to get you going.  Suffer in work International aid/development worker or school because of drug use.  Need drugs to relate to people or feel comfortable in social situations.  Use drugs to forget problems.  Difficulty with attention.  Neglecting obligations. If you answered "yes" to any or some of the above signs of chemical dependency, you may have a problem. The longer the use of drugs continues, the greater the problems will become. Do not experiment with drugs.  SIGNS AND SYMPTOMS OF PHYSICAL DEPENDENCE  Sudden stopping of the narcotic is uncomfortable when tolerance has developed. Physical problems will develop. This is called withdrawal.  How bad the withdrawal is varies from person to person. Some of the smaller problems are:  Tremors in the hands or shakes and jitters.  A fast heart rate and rapid breathing.  An increase in temperature.  Anxiety and panic attacks with bad dreams.  Muscle aches and pains. You may have more serious problems. These can include:  Feeling sick to your stomach or throwing up.  Dehydration develops if you cannot keep fluids down.  Tremors and chills or fever with sweating and anxiety.  Hallucinations and cravings.  Body aching with restlessness and insomnia.  Seizures or convulsions. These problems can last for months. These uncomfortable feeling can cause you to use drugs again just to feel better. OTHER HEALTH RISKS OF NARCOTICS USE INCLUDE:  The increased possibility of getting AIDS, hepatitis, other infectious or sexually transmitted diseases.  Unplanned pregnancy and having a baby born addicted to narcotics. You then put your baby through painful withdrawal symptoms including: shaking, jerking, and crying in pain. Many babies die. Other  babies have lifelong disabilities and learning problems. TREATMENT  Effective treatment and management of narcotic addiction requires a multi-faceted, team approach that includes:  Medications to minimize the symptoms of narcotic withdrawal.  Medications to reduce the need for continued narcotic use.  Medical management of unrelated medical problems.  Pain management.  Social services.  Psychological treatment.  Behavioral therapies. Stopping your dependence is hard but may save your life. If you continue using drugs, the only possible outcome is loss of self respect and esteem, violence, and eventually prison or death. To stop abuse, you must first realize you have a problem. You control your behavior. Once you realize this, commit to quitting. Addiction is a disease. You need medical help to get well. Your caregiver can counsel you or refer you for counseling. The best way to do this is to seek out an organization for help. These include Alcoholics Anonymous, Narcotics Anonymous, or the [employer] on Alcoholism and Drug Dependence. HOW TO STAY CLEAN WHEN YOU HAVE QUIT USING  Develop healthy activities.  Form friendships with those who do not use drugs.  Stay away from all drugs. Alcohol will lessen your ability to say no.  Have ready excuses available about why you cannot use. If that is difficult, stay away from people who knew you  used. HOME CARE INSTRUCTIONS   Both prescription medicines and over-the-counter medicines are used as part of the treatment. It is critical to follow the recommendations of your caregiver at home. Any additional over-the-counter medications or changes in the recommended treatment plan should be discussed with your caregiver first.  It is important to keep fluids down. Juices, soda, Gatorade, or a mixture will help prevent dehydration.  Be prepared for the emotional swings of quitting.  Call your local emergency services if seizures (convulsions)  occur or if you are unable to keep liquids down.  Keep a written record of medications you take and times given. Overcoming addictions takes years. Over time you will have a lessening of the craving for narcotics. Talk to your caregiver or a member of your support group if you need more help.  Addiction cannot be cured but it can be stopped. Treatment centers are listed in the yellow pages under: Cocaine, Narcotics, and Alcoholics Anonymous. Most hospitals and clinics can refer you to a specialized care center. Document Released: 06/08/2007 Document Revised: 11/03/2011 Document Reviewed: 06/08/2007 [facility] Patient Information 2014 [place], [place].  Cocaine Abuse and Chemical Dependency WHEN IS DRUG USE A PROBLEM? Anytime drug use is interfering with normal living activities it has become abuse. This includes problems with family and friends. Psychological dependence has developed when your mind tells you that the drug is needed. This is usually followed by physical dependence which has developed when continuing increases of drug are required to get the same feeling or "high". This is known as addiction or chemical dependency. A person's risk is much higher if there is a history of chemical dependency in the family. SIGNS OF CHEMICAL DEPENDENCY:  Been told by friends or family that drugs have become a problem.  Fighting when using drugs.  Having blackouts (not remembering what you do while using).  Feel sick from using drugs but continue using.  Lie about use or amounts of drugs (chemicals) used.  Need chemicals to get you going.  Suffer in work International aid/development workerperformance or school because of drug use.  Get sick from use of drugs but continue to use anyway.  Need drugs to relate to people or feel comfortable in social situations.  Use drugs to forget problems. Yes answered to any of the above signs of chemical dependency indicates there are problems. The longer the use of drugs continues, the  greater the problems will become. If there is a family history of drug or alcohol use it is best not to experiment with these drugs. Experimentation leads to tolerance and needing to use more of the drug to get the same feeling. This is followed by addiction where drugs become the most important part of life. It becomes more important to take drugs than participate in the other usual activities of life including relating to friends and family. Addiction is followed by dependency where drugs are now needed not just to get high but to feel normal. Addiction cannot be cured but it can be stopped. This often requires outside help and the care of professionals. Treatment centers are listed in the yellow pages under: Cocaine, Narcotics, and Alcoholics Anonymous. Most hospitals and clinics can refer you to a specialized care center. WHAT IS COCAINE? Cocaine is a strong nervous system stimulant which speeds up the body and gives the user the feeling that they have increased energy, loss of appetite and feelings of great pleasure. This "high" which begins within several minutes and lasts for less than an hour  is followed by a "crash". The crash and depressed feelings that come with it cause a craving for the drug to regain the high. HOW IS COCANINE USED? Cocaine is snorted, injected, and smoked as free- base or crack. Because smoking the drug produces a greater high it is also associated with a greater low. It is therefore more rapidly addicting. WHAT ARE THE EFFECTS OF COCAINE? It is an anesthetic (pain killer) and a stimulant (it causes a high which gives a false feeling of well being). It increases heart and breathing rates with increases in body temperature and blood pressure. It removes appetite. It causes seizures (convulsions) along with nausea (feeling sick to your stomach), vomiting and stomach pain. This dangerous combination can lead to death. Trying to keep the high feeling leads to greater and greater  drug use and this leads to addiction. Addiction can only be helped by stopping use of all chemicals. This is hard but may save your life. If the addiction is continued, the only possible outcome is loss of self respect and self esteem, violence, death, and eventually prison if the addict is fortunate enough to be caught and able to receive help prior to this last life ending event. OTHER HEALTH RISKS OF COCAINE AND ALL DRUG USE ARE:  The increased possibility of getting AIDS or hepatitis (liver inflammation).  Having a baby born which is addicted to cocaine and must go through painful withdrawal including shaking, jerking, and crying in pain. Many of the babies die. Other babies go through life with lifelong disabilities and learning problems. HOW TO STAY DRUG FREE ONCE YOU HAVE QUIT USING:  Develop healthy activities and form friends who do not use drugs.  Stay away from the drug scene.  Tell the pusher or former friend you have other better things to do.  Have ready excuses available about why you cannot use. For more help or information contact your local physician, clinic, hospital or dial 1-800-cocaine (786)824-9489([phone]). Document Released: 08/08/2000 Document Revised: 11/03/2011 Document Reviewed: 03/29/2008 [facility] Patient Information 2014 [place], [place].

## 2014-02-07 NOTE — ED Notes (Signed)
Pt c/o rash/scabs all over body onset 1 week She believes it may be scabies She denies fevers, cold sx, new hygiene/meds Hx of cocaine and heroin abuse on a daily basis Reports scabs start out as a small bump and she'll pick them and become scabs Alert w/no signs of acute distress.

## 2014-02-10 NOTE — ED Provider Notes (Signed)
Medical screening examination/treatment/procedure(s) were performed by resident physician or non-physician practitioner and as supervising physician I was immediately available for consultation/collaboration.   KINDL,JAMES DOUGLAS MD.   James D Kindl, MD 02/10/14 1644 

## 2014-03-31 ENCOUNTER — Emergency Department (HOSPITAL_COMMUNITY)
Admission: EM | Admit: 2014-03-31 | Discharge: 2014-03-31 | Disposition: A | Discharge status: Home / Self Care | Payer: No Typology Code available for payment source | Attending: Emergency Medicine | Admitting: Emergency Medicine

## 2014-03-31 ENCOUNTER — Telehealth (HOSPITAL_BASED_OUTPATIENT_CLINIC_OR_DEPARTMENT_OTHER): Payer: Self-pay | Admitting: Emergency Medicine

## 2014-03-31 ENCOUNTER — Encounter (HOSPITAL_COMMUNITY): Payer: Self-pay | Admitting: Emergency Medicine

## 2014-03-31 DIAGNOSIS — L259 Unspecified contact dermatitis, unspecified cause: Secondary | ICD-10-CM | POA: Insufficient documentation

## 2014-03-31 DIAGNOSIS — L309 Dermatitis, unspecified: Secondary | ICD-10-CM

## 2014-03-31 DIAGNOSIS — F1410 Cocaine abuse, uncomplicated: Secondary | ICD-10-CM

## 2014-03-31 DIAGNOSIS — R21 Rash and other nonspecific skin eruption: Secondary | ICD-10-CM

## 2014-03-31 DIAGNOSIS — Z3202 Encounter for pregnancy test, result negative: Secondary | ICD-10-CM | POA: Insufficient documentation

## 2014-03-31 DIAGNOSIS — F17200 Nicotine dependence, unspecified, uncomplicated: Secondary | ICD-10-CM | POA: Insufficient documentation

## 2014-03-31 DIAGNOSIS — F1110 Opioid abuse, uncomplicated: Secondary | ICD-10-CM

## 2014-03-31 DIAGNOSIS — L98499 Non-pressure chronic ulcer of skin of other sites with unspecified severity: Secondary | ICD-10-CM | POA: Insufficient documentation

## 2014-03-31 LAB — COMPREHENSIVE METABOLIC PANEL
ALT: 11 U/L (ref 0–35)
AST: 19 U/L (ref 0–37)
Albumin: 3.4 g/dL — ABNORMAL LOW (ref 3.5–5.2)
Alkaline Phosphatase: 71 U/L (ref 39–117)
Anion gap: 11 (ref 5–15)
BUN: 16 mg/dL (ref 6–23)
CO2: 25 mEq/L (ref 19–32)
Calcium: 8.7 mg/dL (ref 8.4–10.5)
Chloride: 106 mEq/L (ref 96–112)
Creatinine, Ser: 0.93 mg/dL (ref 0.50–1.10)
GFR calc Af Amer: >90 mL/min (ref >90)
GFR calc non Af Amer: 81 mL/min — ABNORMAL LOW (ref >90)
Glucose, Bld: 105 mg/dL — ABNORMAL HIGH (ref 70–99)
Potassium: 4.2 mEq/L (ref 3.7–5.3)
Sodium: 142 mEq/L (ref 137–147)
Total Bilirubin: 0.2 mg/dL — ABNORMAL LOW (ref 0.3–1.2)
Total Protein: 6.5 g/dL (ref 6.0–8.3)

## 2014-03-31 LAB — CBC WITH DIFFERENTIAL/PLATELET
Basophils Absolute: 0 10*3/uL (ref 0.0–0.1)
Basophils Relative: 1 % (ref 0–1)
Eosinophils Absolute: 0.5 10*3/uL (ref 0.0–0.7)
Eosinophils Relative: 9 % — ABNORMAL HIGH (ref 0–5)
HCT: 38.8 % (ref 36.0–46.0)
Hemoglobin: 12.4 g/dL (ref 12.0–15.0)
Lymphocytes Relative: 50 % — ABNORMAL HIGH (ref 12–46)
Lymphs Abs: 2.7 10*3/uL (ref 0.7–4.0)
MCH: 31.7 pg (ref 26.0–34.0)
MCHC: 32 g/dL (ref 30.0–36.0)
MCV: 99.2 fL (ref 78.0–100.0)
Monocytes Absolute: 0.3 10*3/uL (ref 0.1–1.0)
Monocytes Relative: 7 % (ref 3–12)
Neutro Abs: 1.7 10*3/uL (ref 1.7–7.7)
Neutrophils Relative %: 33 % — ABNORMAL LOW (ref 43–77)
Platelets: 167 10*3/uL (ref 150–400)
RBC: 3.91 MIL/uL (ref 3.87–5.11)
RDW: 13.8 % (ref 11.5–15.5)
WBC: 5.3 10*3/uL (ref 4.0–10.5)

## 2014-03-31 LAB — RAPID URINE DRUG SCREEN, HOSP PERFORMED
Amphetamines: POSITIVE — AB
Barbiturates: NOT DETECTED
Benzodiazepines: NOT DETECTED
Cocaine: POSITIVE — AB
Opiates: POSITIVE — AB
Tetrahydrocannabinol: POSITIVE — AB

## 2014-03-31 LAB — TROPONIN I: Troponin I: <0.3 ng/mL (ref <0.30)

## 2014-03-31 LAB — POC URINE PREG, ED: Preg Test, Ur: NEGATIVE

## 2014-03-31 MED ORDER — HYDROCORTISONE 1 % EX CREA: TOPICAL_CREAM | CUTANEOUS | Status: DC

## 2014-03-31 MED ORDER — SODIUM CHLORIDE 0.9 % IV BOLUS (SEPSIS): 1000.0000 mL | Freq: Once | INTRAVENOUS | Status: DC

## 2014-03-31 NOTE — ED Notes (Addendum)
Pt. Refuses IV unless it is to treat a major infection. PA-C made aware.

## 2014-03-31 NOTE — ED Provider Notes (Signed)
CSN: [id number]     Arrival date & time 03/31/14  0356 History   First MD Initiated Contact with Patient 03/31/14 [id number]     Chief Complaint  Patient presents with  . Skin Ulcer     (Consider location/radiation/quality/duration/timing/severity/associated sxs/prior Treatment) The history is provided by the patient. No language interpreter was used.  Tiffany Salazar is a 33 y/o F with no known significant PMHx presenting to the ED with scabbed over lesions noted to her skin. Patient reported that she has been having this rash for approximately 2 months. Patient reported that a month [facility] she was started on an antibiotics regimen of Bactrim with minimal relief. Patient reported that the lesions start as red dots on the skin that itch mildly and that with time they turn into scabs. Patient reported that she has been placing peroxide on the lesions and neosporin. Patient reported that she does participate in IV drug abuse - stated that uses cocaine mainly and stated that she does use clean needles. Denied bleeding, drainage, numbness, tingling, weakness, chills, chest pain, shortness of breath, difficulty breathing, abdominal pain, nausea, vomiting. PCP none   History reviewed. No pertinent past medical history. Past Surgical History  Procedure Laterality Date  . Cesarean section  x 2   History reviewed. No pertinent family history. History  Substance Use Topics  . Smoking status: Current Every Day Smoker -- 1.00 packs/day    Types: Cigarettes  . Smokeless tobacco: Not on file  . Alcohol Use: No   OB History   Grav Para Term Preterm Abortions TAB SAB Ect Mult Living                 Review of Systems  Constitutional: Negative for fever and chills.  Respiratory: Negative for chest tightness and shortness of breath.   Cardiovascular: Negative for chest pain.  Gastrointestinal: Negative for nausea, vomiting, abdominal pain and diarrhea.   Skin: Positive for rash.  Neurological: Negative for dizziness, weakness and headaches.      Allergies  Review of patient's allergies indicates no known allergies.  Home Medications   Prior to Admission medications   Medication Sig Start Date End Date Taking? Authorizing Provider  hydrocortisone cream 1 % Apply to affected area 2 times daily 03/31/14   Kylene Zamarron, PA-C   BP 95/63  Pulse 83  Temp(Src) 98 F (36.7 C) (Oral)  Resp 13  Ht 5\' 7"  (1.702 m)  Wt 130 lb (58.968 kg)  BMI 20.36 kg/m2  SpO2 96%  LMP 03/25/2014 Physical Exam  Nursing note and vitals reviewed. Constitutional: She is oriented to person, place, and time. She appears well-developed and well-nourished.  Patient appears lethargic  HENT:  Head: Normocephalic and atraumatic.  Mouth/Throat: Oropharynx is clear and moist. No oropharyngeal exudate.  Scabbed over lesions with negative active drainage or bleeding noted   Eyes: Conjunctivae and EOM are normal. Pupils are equal, round, and reactive to light. Right eye exhibits no discharge. Left eye exhibits no discharge.  Neck: Normal range of motion. Neck supple. No tracheal deviation present.  Negative neck stiffness Negative nuchal rigidity  Negative cervical lymphadenopathy  Negative meningeal signs   Cardiovascular: Normal rate, regular rhythm and normal heart sounds.  Exam reveals no friction rub.   No murmur heard. Cap refill < 3 seconds Negative swelling or pitting edema noted to the lower extremities bilaterally   Pulmonary/Chest: Effort normal and breath sounds normal. No respiratory  distress. She has no wheezes. She has no rales. She exhibits no tenderness.  Patient is able to speak in full sentences without difficulty  Negative use of accessory muscles Negative stridor  Musculoskeletal: Normal range of motion.  Full ROM to upper and lower extremities without difficulty noted, negative ataxia noted.  Lymphadenopathy:    She has no cervical  adenopathy.  Neurological: She is alert and oriented to person, place, and time. No cranial nerve deficit. She exhibits normal muscle tone. Coordination normal.  Cranial nerves III-XII grossly intact Strength 5+/5+ to upper and lower extremities bilaterally with resistance applied, equal distribution noted Patient follows commands well  Skin: She is not diaphoretic.  Scabbed over lesions noted to the arms and legs bilaterally, as well as face. Negative active drainage or bleeding noted. Negative cellulitic findings. Negative red streaks. Track marks noted bilaterally with negative inflammation, swelling. Negative findings of phlebitis.   Psychiatric: She has a normal mood and affect. Her behavior is normal. Thought content normal.  Slurred speech     ED Course  Procedures (including critical care time)  This provider wanted to start IV on patient and give IV fluids. Patient declined and did not want an IV to be started unless it was absolutely necessary.   6:48 AM Attending physician at bedside assessing patient, Dr. Leeann Must.  Results for orders placed during the hospital encounter of 03/31/14  CBC WITH DIFFERENTIAL      Result Value Ref Range   WBC 5.3  4.0 - 10.5 K/uL   RBC 3.91  3.87 - 5.11 MIL/uL   Hemoglobin 12.4  12.0 - 15.0 g/dL   HCT 16.1  09.6 - 04.5 %   MCV 99.2  78.0 - 100.0 fL   MCH 31.7  26.0 - 34.0 pg   MCHC 32.0  30.0 - 36.0 g/dL   RDW 40.9  81.1 - 91.4 %   Platelets 167  150 - 400 K/uL   Neutrophils Relative % 33 (*) 43 - 77 %   Neutro Abs 1.7  1.7 - 7.7 K/uL   Lymphocytes Relative 50 (*) 12 - 46 %   Lymphs Abs 2.7  0.7 - 4.0 K/uL   Monocytes Relative 7  3 - 12 %   Monocytes Absolute 0.3  0.1 - 1.0 K/uL   Eosinophils Relative 9 (*) 0 - 5 %   Eosinophils Absolute 0.5  0.0 - 0.7 K/uL   Basophils Relative 1  0 - 1 %   Basophils Absolute 0.0  0.0 - 0.1 K/uL  COMPREHENSIVE METABOLIC PANEL      Result Value Ref Range   Sodium 142  137 - 147 mEq/L   Potassium 4.2   3.7 - 5.3 mEq/L   Chloride 106  96 - 112 mEq/L   CO2 25  19 - 32 mEq/L   Glucose, Bld 105 (*) 70 - 99 mg/dL   BUN 16  6 - 23 mg/dL   Creatinine, Ser 7.82  0.50 - 1.10 mg/dL   Calcium 8.7  8.4 - 95.6 mg/dL   Total Protein 6.5  6.0 - 8.3 g/dL   Albumin 3.4 (*) 3.5 - 5.2 g/dL   AST 19  0 - 37 U/L   ALT 11  0 - 35 U/L   Alkaline Phosphatase 71  39 - 117 U/L   Total Bilirubin 0.2 (*) 0.3 - 1.2 mg/dL   GFR calc non Af Amer 81 (*) >90 mL/min   GFR calc Af Amer >90  >90  mL/min   Anion gap 11  5 - 15  URINE RAPID DRUG SCREEN (HOSP PERFORMED)      Result Value Ref Range   Opiates POSITIVE (*) NONE DETECTED   Cocaine POSITIVE (*) NONE DETECTED   Benzodiazepines NONE DETECTED  NONE DETECTED   Amphetamines POSITIVE (*) NONE DETECTED   Tetrahydrocannabinol POSITIVE (*) NONE DETECTED   Barbiturates NONE DETECTED  NONE DETECTED  TROPONIN I      Result Value Ref Range   Troponin I <0.30  <0.30 ng/mL  POC URINE PREG, ED      Result Value Ref Range   Preg Test, Ur NEGATIVE  NEGATIVE    Labs Review Labs Reviewed  CBC WITH DIFFERENTIAL - Abnormal; Notable for the following:    Neutrophils Relative % 33 (*)    Lymphocytes Relative 50 (*)    Eosinophils Relative 9 (*)    All other components within normal limits  COMPREHENSIVE METABOLIC PANEL - Abnormal; Notable for the following:    Glucose, Bld 105 (*)    Albumin 3.4 (*)    Total Bilirubin 0.2 (*)    GFR calc non Af Amer 81 (*)    All other components within normal limits  URINE RAPID DRUG SCREEN (HOSP PERFORMED) - Abnormal; Notable for the following:    Opiates POSITIVE (*)    Cocaine POSITIVE (*)    Amphetamines POSITIVE (*)    Tetrahydrocannabinol POSITIVE (*)    All other components within normal limits  TROPONIN I  POC URINE PREG, ED    Imaging Review No results found.   EKG Interpretation None      MDM   Final diagnoses:  Rash and nonspecific skin eruption  Dermatitis  Cocaine abuse  Opioid abuse    Filed  Vitals:   03/31/14 0403 03/31/14 0630 03/31/14 0730  BP: 100/65 94/59 95/63   Pulse: 81 85 83  Temp: 98 F (36.7 C)    TempSrc: Oral    Resp: 16 18 13   Height: 5\' 7"  (1.702 m)    Weight: 130 lb (58.968 kg)    SpO2: 96% 96% 96%   CBC negative findings-negative elevated white blood cell count left shift. CMP unremarkable. Troponin negative elevation. Urine pregnancy negative. Urinalysis positive for opiates, cocaine, amphetamines, cannabis.Patient refused EKG.  Definitive etiology of rash unknown. Suspicion to be secondary to IV drug abuse. Doubt acute hepatitis. Doubt levamisole rash. Doubt shingles. Negative findings of cellulitic infections. Negative findings of phlebitis. Patient seen and assessed by attending physician who cleared patient for discharge - recommended patient to be placed on hydrocortisone cream. Referred to [facility] wellness center and dermatologist. Discussed with patient dangers and consequences of IV drug abuse. Discussed with patient to closely monitor symptoms and if symptoms are to worsen or change to report back to the ED - strict return instructions given.  Patient agreed to plan of care, understood, all questions answered.   Raymon MuttonMarissa Batool Majid, PA-C 03/31/14 1946

## 2014-03-31 NOTE — ED Notes (Signed)
Pt reports having a ride home, pt falls asleep very easily, pt alert to verbal stimuli

## 2014-03-31 NOTE — ED Notes (Signed)
Pt refuses EKG, [place], PA informed

## 2014-03-31 NOTE — ED Notes (Signed)
Patient here with complaint of ulcerations of the skin secondary to IV drug use. States there are a few spots on right arm, left arm, left leg, face, and ear.

## 2014-03-31 NOTE — Discharge Instructions (Signed)
Please call your doctor for a followup appointment within 24-48 hours. When you talk to your doctor please let them know that you were seen in the emergency department and have them acquire all of your records so that they can discuss the findings with you and formulate a treatment plan to fully care for your new and ongoing problems. Please call and set-up an appointment with [facility] Please rest and stay hydrated  Please refrain from using IV drugs  Please use medications as prescribed Please continue to monitor symptoms closely and if symptoms are to worsen or change (fever greater than 101, chills, sweating, nausea, vomiting, chest pain, shortness of breath, difficulty breathing, weakness, numbness, tingling, facial drooping, swelling, throat closing sensation) please report back to the ED immediately   Drug Abuse and Addiction in Sports There are many types of drugs that one may become addicted to including illegal drugs (marijuana, cocaine, amphetamines, hallucinogens, and narcotics), prescription drugs (hydrocodone, codeine, and alprazolam), and other chemicals such as alcohol or nicotine. Two types of addiction exist: physical and emotional. Physical addiction usually occurs after prolonged use of a drug. However, some drugs may only take a couple uses before addiction can occur. Physical addiction is marked by withdrawal symptoms in which the person experiences negative symptoms such as sweat, anxiety, tremors, hallucinations, or cravings in the absence of using the drug. Emotional dependence is the psychological desire for the "high" that the drugs produce when taken. SYMPTOMS   Inattentiveness.  Negligence.  Forgetfulness.  Insomnia.  Mood swings. RISK INCREASES WITH:   Family history of addiction.  Personal history of addictive personality. Studies have shown that risk takers, which many athletes are, have a higher risk of addiction. PREVENTION The only  adequate prevention of drug abuse is abstinence from drugs. TREATMENT  The first step in quitting substance abuse is recognizing the problem and realizing that one has the power to change. Quitting requires a plan and support from others. It is often necessary to seek medical assistance. Caregivers are available to offer counseling, and for certain cases, medicine to diminish the physical symptoms of withdrawal. Many organizations exist such as Alcoholics Anonymous, Narcotics Anonymous, or the [employer] that offer support for individuals who have chosen to quit their habits. Document Released: 08/11/2005 Document Revised: 12/26/2013 Document Reviewed: 11/23/2008 [facility] Patient Information 2015 [employer] [place]. This information is not intended to replace advice given to you by your health care provider. Make sure you discuss any questions you have with your health care provider.

## 2014-04-03 NOTE — ED Provider Notes (Signed)
Medical screening examination/treatment/procedure(s) were conducted as a shared visit with non-physician practitioner(s) and myself.  I personally evaluated the patient during the encounter.  Pt w hx substance abuse, c/o scabbed over lesions on trunk and extremities. No palms or soles. No mm involvement. Does not feel ill or sick. No fever or chills.      Suzi RootsKevin E Kvon Mcilhenny, MD 04/03/14 [phone]

## 2014-07-14 ENCOUNTER — Emergency Department (HOSPITAL_COMMUNITY)
Admission: EM | Admit: 2014-07-14 | Discharge: 2014-07-14 | Disposition: A | Discharge status: Home / Self Care | Payer: No Typology Code available for payment source | Attending: Emergency Medicine | Admitting: Emergency Medicine

## 2014-07-14 ENCOUNTER — Encounter (HOSPITAL_COMMUNITY): Payer: Self-pay | Admitting: Emergency Medicine

## 2014-07-14 DIAGNOSIS — L02414 Cutaneous abscess of left upper limb: Secondary | ICD-10-CM

## 2014-07-14 DIAGNOSIS — L02413 Cutaneous abscess of right upper limb: Secondary | ICD-10-CM

## 2014-07-14 DIAGNOSIS — L02415 Cutaneous abscess of right lower limb: Secondary | ICD-10-CM | POA: Insufficient documentation

## 2014-07-14 DIAGNOSIS — Z7952 Long term (current) use of systemic steroids: Secondary | ICD-10-CM | POA: Insufficient documentation

## 2014-07-14 DIAGNOSIS — R000 Tachycardia, unspecified: Secondary | ICD-10-CM | POA: Insufficient documentation

## 2014-07-14 DIAGNOSIS — L02416 Cutaneous abscess of left lower limb: Secondary | ICD-10-CM | POA: Insufficient documentation

## 2014-07-14 DIAGNOSIS — Z720 Tobacco use: Secondary | ICD-10-CM | POA: Insufficient documentation

## 2014-07-14 DIAGNOSIS — H00013 Hordeolum externum right eye, unspecified eyelid: Secondary | ICD-10-CM

## 2014-07-14 MED ORDER — LIDOCAINE-EPINEPHRINE (PF) 2 %-1:200000 IJ SOLN
20.0000 mL | Freq: Once | INTRAMUSCULAR | Status: AC
  Administered 2014-07-14: 20 mL
  Filled 2014-07-14: qty 20

## 2014-07-14 MED ORDER — FLUCONAZOLE 200 MG PO TABS: 200.0000 mg | ORAL_TABLET | Freq: Every day | ORAL | Status: AC

## 2014-07-14 MED ORDER — DOXYCYCLINE HYCLATE 100 MG PO CAPS: 100.0000 mg | ORAL_CAPSULE | Freq: Two times a day (BID) | ORAL | Status: DC

## 2014-07-14 MED ORDER — IBUPROFEN 200 MG PO TABS
600.0000 mg | ORAL_TABLET | Freq: Once | ORAL | Status: AC
  Administered 2014-07-14: 600 mg via ORAL
  Filled 2014-07-14: qty 3

## 2014-07-14 MED ORDER — IBUPROFEN 600 MG PO TABS: 600.0000 mg | ORAL_TABLET | Freq: Three times a day (TID) | ORAL | Status: DC | PRN

## 2014-07-14 NOTE — ED Notes (Signed)
Pt. reports stye at right eye and abscess ar right forearm onset last week , denies drainage / no fever or chills.

## 2014-07-14 NOTE — Discharge Instructions (Signed)

## 2014-07-14 NOTE — ED Provider Notes (Signed)
CSN: [id number]     Arrival date & time 07/14/14  0343 History   First MD Initiated Contact with Patient 07/14/14 [phone]     Chief Complaint  Patient presents with  . Abscess  . Stye      HPI Patient presents to the emergency department complaining of bilateral forearm abscesses.  She injects heroin.  She states she is currently in rehabilitation.  She also reports stye to her right upper eyelid.  She reports all the symptoms become present for the past 3-5 days.  Denies fevers or chills.  Denies abdominal pain.  No myalgias.    Past Medical History  Diagnosis Date  . Polysubstance abuse   . IV drug user    Past Surgical History  Procedure Laterality Date  . Cesarean section  x 2   No family history on file. History  Substance Use Topics  . Smoking status: Current Every Day Smoker -- 1.00 packs/day    Types: Cigarettes  . Smokeless tobacco: Not on file  . Alcohol Use: No   OB History    No data available     Review of Systems  All other systems reviewed and are negative.     Allergies  Review of patient's allergies indicates no known allergies.  Home Medications   Prior to Admission medications   Medication Sig Start Date End Date Taking? Authorizing Provider  SUBOXONE 8-2 MG FILM Place 1 Film under the tongue daily.  07/07/14  Yes Historical Provider, MD  doxycycline (VIBRAMYCIN) 100 MG capsule Take 1 capsule (100 mg total) by mouth 2 (two) times daily. 07/14/14   Lyanne CoKevin M Delara Shepheard, MD  hydrocortisone cream 1 % Apply to affected area 2 times daily 03/31/14   Marissa Sciacca, PA-C  ibuprofen (ADVIL,MOTRIN) 600 MG tablet Take 1 tablet (600 mg total) by mouth every 8 (eight) hours as needed. 07/14/14   Lyanne CoKevin M Jerry Haugen, MD   BP 109/63 mmHg  Pulse 112  Temp(Src) 99 F (37.2 C) (Oral)  Resp 17  Ht 5\' 7"  (1.702 m)  Wt 130 lb (58.968 kg)  BMI 20.36 kg/m2  SpO2 93%  LMP 06/07/2014 Physical Exam  Constitutional: She is oriented to person, place, and time. She appears  well-developed and well-nourished. No distress.  HENT:  Head: Normocephalic and atraumatic.  Small stye to the right upper eyelid without surrounding erythema.  Eyes: EOM are normal.  Neck: Normal range of motion.  Cardiovascular: Regular rhythm and normal heart sounds.   Tachycardia  Pulmonary/Chest: Effort normal and breath sounds normal.  Abdominal: Soft. She exhibits no distension. There is no tenderness.  Musculoskeletal: Normal range of motion.  Right posterior mid forearm with abscess with small surrounding erythema warmth.  There is fluctuance.  No drainage.  Left mid posterior forearm with small abscess with fluctuance and no drainage.  No surrounding erythema around the left forearm abscess.  Neurological: She is alert and oriented to person, place, and time.  Skin: Skin is warm and dry.  Psychiatric: She has a normal mood and affect. Judgment normal.  Nursing note and vitals reviewed.   ED Course  Procedures   INCISION AND DRAINAGE Performed by: Lyanne CoAMPOS,Chen Holzman M Consent: Verbal consent obtained. Risks and benefits: risks, benefits and alternatives were discussed Time out performed prior to procedure Type: abscess Body area: left forearm  Anesthesia: local infiltration Incision was made with a scalpel. Local anesthetic: lidocaine 2% with epinephrine Anesthetic total: 4 ml Complexity: complex Blunt dissection to break up loculations Drainage: purulent  Drainage amount: large Packing material: none Patient tolerance: Patient tolerated the procedure well with no immediate complications.   INCISION AND DRAINAGE Performed by: Lyanne CoAMPOS,Simmone Cape M Consent: Verbal consent obtained. Risks and benefits: risks, benefits and alternatives were discussed Time out performed prior to procedure Type: abscess Body area: right forearm Anesthesia: local infiltration Incision was made with a scalpel. Local anesthetic: lidocaine 2% with epinephrine Anesthetic total: 4 ml Complexity:  complex Blunt dissection to break up loculations Drainage: purulent Drainage amount: moderate Packing material: none Patient tolerance: Patient tolerated the procedure well with no immediate complications.    Labs Review Labs Reviewed - No data to display  Imaging Review No results found.   EKG Interpretation None      MDM   Final diagnoses:  Abscess of right forearm  Abscess of left forearm  Stye external, right    Tachycardia improving while in the emergency department without therapy.  I think this is secondary more to anxiety.  I do not believe she is toxic.  Drainage of her bilateral forearm abscesses.  Patient was placed on Doxil if she has a small amount of cellulitis surrounding her right forearm.    Lyanne CoKevin M Vear Staton, MD 07/14/14 [phone]

## 2014-07-14 NOTE — ED Notes (Signed)
Lidocaine placed at bedside.

## 2014-07-19 ENCOUNTER — Encounter (HOSPITAL_COMMUNITY): Payer: Self-pay | Admitting: Emergency Medicine

## 2014-07-19 ENCOUNTER — Emergency Department (HOSPITAL_COMMUNITY)
Admission: EM | Admit: 2014-07-19 | Discharge: 2014-07-19 | Disposition: A | Discharge status: Home / Self Care | Payer: Self-pay | Attending: Emergency Medicine | Admitting: Emergency Medicine

## 2014-07-19 DIAGNOSIS — Z792 Long term (current) use of antibiotics: Secondary | ICD-10-CM | POA: Insufficient documentation

## 2014-07-19 DIAGNOSIS — Z720 Tobacco use: Secondary | ICD-10-CM | POA: Insufficient documentation

## 2014-07-19 DIAGNOSIS — Z79899 Other long term (current) drug therapy: Secondary | ICD-10-CM | POA: Insufficient documentation

## 2014-07-19 DIAGNOSIS — Z7952 Long term (current) use of systemic steroids: Secondary | ICD-10-CM | POA: Insufficient documentation

## 2014-07-19 DIAGNOSIS — F1910 Other psychoactive substance abuse, uncomplicated: Secondary | ICD-10-CM | POA: Insufficient documentation

## 2014-07-19 DIAGNOSIS — L02413 Cutaneous abscess of right upper limb: Secondary | ICD-10-CM | POA: Insufficient documentation

## 2014-07-19 MED ORDER — LIDOCAINE-EPINEPHRINE (PF) 2 %-1:200000 IJ SOLN
20.0000 mL | Freq: Once | INTRAMUSCULAR | Status: AC
  Administered 2014-07-19: 20 mL via INTRADERMAL
  Filled 2014-07-19: qty 20

## 2014-07-19 NOTE — ED Notes (Signed)
Pt right forearm dressed by EMT. Bosie ClosJudith.

## 2014-07-19 NOTE — ED Notes (Signed)
Pt. reports persistent right forearm abscess onset last week , currently taking Doxycycline oral antibiotic prescribed here 5 days ago for abscess.

## 2014-07-19 NOTE — ED Provider Notes (Signed)
  Physical Exam  BP 95/54 mmHg  Pulse 75  Temp(Src) 98.1 F (36.7 C) (Oral)  Resp 20  SpO2 98%  LMP 06/07/2014  Physical Exam  ED Course  INCISION AND DRAINAGE Date/Time: 07/19/2014 7:18 AM Performed by: Mirian MoGENTRY, Karliah Kowalchuk Authorized by: Mirian MoGENTRY, Autrey Human Consent: Verbal consent obtained. Risks and benefits: risks, benefits and alternatives were discussed Type: abscess Body area: upper extremity Location details: right arm Anesthesia: local infiltration Local anesthetic: lidocaine 1% with epinephrine Scalpel size: 11 Incision type: dual. Complexity: complex Drainage: purulent Drainage amount: copious Wound treatment: wound left open and  drain placed Packing material: vessel loop. Patient tolerance: Patient tolerated the procedure well with no immediate complications   Assumed care of pt from Dr. Loretha StaplerWofford in stable condition.  Pt has recurrent R forearm abscess at site of recently drained abscess with continuing IVDA.  She has been compliant with doxycycline.  Abscesses have been improving with medication. Feel this is appropriate to continue, now that source control is obtained after drainage as above.  I strongly encouraged her to return for fu in 2 days for wound recheck.  She has no signs of sepsis at this time, although her BP is mildly low, this is likely due to current use of IVD and she is non septic appearing here.  Stable for dc home.   1. Polysubstance abuse   2. Abscess of forearm, right        Mirian MoMatthew Hiep Ollis, MD 07/19/14 [phone]

## 2014-07-19 NOTE — ED Provider Notes (Signed)
CSN: [id number]     Arrival date & time 07/19/14  0403 History   First MD Initiated Contact with Patient 07/19/14 0542     Chief Complaint  Patient presents with  . Abscess     (Consider location/radiation/quality/duration/timing/severity/associated sxs/prior Treatment) Patient is a 33 y.o. female presenting with abscess.  Abscess Location:  Shoulder/arm Shoulder/arm abscess location:  R forearm Size:  4cm x 2cm Abscess quality: fluctuance, painful, redness and warmth   Abscess quality: not draining   Duration:  10 days Progression:  Unchanged Pain details:    Severity:  Moderate   Timing:  Constant   Progression:  Unchanged Chronicity:  New Relieved by:  Nothing Ineffective treatments: I and D'd several days ago, on abx now. Associated symptoms: no fever   Risk factors comment:  Injects drugs   Past Medical History  Diagnosis Date  . Polysubstance abuse   . IV drug user   . Abscess    Past Surgical History  Procedure Laterality Date  . Cesarean section  x 2   No family history on file. History  Substance Use Topics  . Smoking status: Current Every Day Smoker -- 1.00 packs/day    Types: Cigarettes  . Smokeless tobacco: Not on file  . Alcohol Use: No   OB History    No data available     Review of Systems  Constitutional: Negative for fever.  All other systems reviewed and are negative.     Allergies  Review of patient's allergies indicates no known allergies.  Home Medications   Prior to Admission medications   Medication Sig Start Date End Date Taking? Authorizing Provider  doxycycline (VIBRAMYCIN) 100 MG capsule Take 1 capsule (100 mg total) by mouth 2 (two) times daily. 07/14/14  Yes Lyanne CoKevin M Campos, MD  ibuprofen (ADVIL,MOTRIN) 600 MG tablet Take 1 tablet (600 mg total) by mouth every 8 (eight) hours as needed. 07/14/14  Yes Lyanne CoKevin M Campos, MD  SUBOXONE 8-2 MG FILM Place 1 Film under the tongue daily.  07/07/14  Yes Historical Provider, MD    fluconazole (DIFLUCAN) 200 MG tablet Take 1 tablet (200 mg total) by mouth daily. 07/14/14 07/21/14  Lyanne CoKevin M Campos, MD  hydrocortisone cream 1 % Apply to affected area 2 times daily Patient not taking: Reported on 07/19/2014 03/31/14   Marissa Sciacca, PA-C   BP 106/58 mmHg  Pulse 74  Temp(Src) 98.2 F (36.8 C) (Oral)  Resp 20  SpO2 96%  LMP 06/07/2014 Physical Exam  Constitutional: She is oriented to person, place, and time. She appears well-developed and well-nourished. No distress.  HENT:  Head: Normocephalic and atraumatic.  Eyes: Conjunctivae are normal. No scleral icterus.  Neck: Neck supple.  Cardiovascular: Normal rate and intact distal pulses.   Pulmonary/Chest: Effort normal. No stridor. No respiratory distress.  Abdominal: Normal appearance. She exhibits no distension.  Neurological: She is alert and oriented to person, place, and time.  Skin: Skin is warm and dry. No rash noted.     Psychiatric: She has a normal mood and affect. Her behavior is normal.  Nursing note and vitals reviewed.   ED Course  Procedures (including critical care time) Labs Review Labs Reviewed - No data to display  Imaging Review No results found.   EKG Interpretation None      MDM   Final diagnoses:  Polysubstance abuse  Abscess of forearm, right    Right forearm abscess.  She reports that her other abscess got better, but this area  did not improve.    Care transferred to Dr. Littie DeedsGentry.    Warnell Foresterrey Decklyn Hyder, MD 07/19/14 [phone]

## 2014-07-19 NOTE — Discharge Instructions (Signed)

## 2014-07-21 ENCOUNTER — Telehealth (HOSPITAL_BASED_OUTPATIENT_CLINIC_OR_DEPARTMENT_OTHER): Payer: Self-pay | Admitting: Emergency Medicine

## 2014-07-21 LAB — WOUND CULTURE

## 2014-07-21 NOTE — Telephone Encounter (Signed)
Chart handoff to EDP + MRSA wound culture, being treated with doxycycline bid since 11/20, duration of treatment not noted

## 2014-07-24 ENCOUNTER — Telehealth (HOSPITAL_COMMUNITY): Payer: Self-pay

## 2014-07-24 NOTE — Telephone Encounter (Signed)
Post ED Visit - Positive Culture Follow-up  Culture report reviewed by antimicrobial stewardship pharmacist: []  Tiffany Salazar, Pharm.D., BCPS []  Tiffany Salazar, Pharm.D., BCPS []  Tiffany Salazar, Pharm.D., BCPS [x]  [place]h Salazar, [place]., BCPS, AAHIVP []  Tiffany Salazar, Pharm.D., BCPS, AAHIVP []  Tiffany Salazar, [address]   Positive Wound culture Pt on Doxycycline , organism sensitive to the same and no further patient follow-up is required at this time.  Tiffany Salazar, Tiffany Salazar Dorn 07/24/2014, 5:58 AM

## 2014-07-25 ENCOUNTER — Telehealth (HOSPITAL_BASED_OUTPATIENT_CLINIC_OR_DEPARTMENT_OTHER): Payer: Self-pay | Admitting: Emergency Medicine

## 2014-07-25 NOTE — Telephone Encounter (Signed)
Wound culture + MRSA , doxycycline d/c 'd, Clindamycin 300mg  po tid x 10 days to be called in, [facility]

## 2014-07-25 NOTE — Telephone Encounter (Deleted)
Post ED Visit - Positive Culture Follow-up: Successful Patient Follow-Up  Culture assessed and recommendations reviewed by: []  Wes Dulaney, Pharm.D., BCPS []  Celedonio MiyamotoJeremy Frens, Pharm.D., BCPS []  Georgina PillionElizabeth Martin, [address]Ph[address]D., BCPS []  [place]bernMinh Pham, [address]Ph[address]., BCPS, AAHIVP []  Estella HuskMichelle Turner, Pharm.D., BCPS, AAHIVP []  Red ChristiansSamson Lee, Pharm.D. []  Tennis Mustassie Stewart, [place]arm[place]D.  Positive *** culture  []  Patient discharged without antimicrobial prescription and treatment is now indicated []  Organism is resistant to prescribed ED discharge antimicrobial []  Patient with positive blood cultures  Changes discussed with ED provider: *** New antibiotic prescription *** Called to ***  Contacted patient, date ***, time ***   Berle MullMiller, Santos Hardwick 07/25/2014, 10:21 AM

## 2014-08-13 ENCOUNTER — Emergency Department (HOSPITAL_COMMUNITY)
Admission: EM | Admit: 2014-08-13 | Discharge: 2014-08-13 | Disposition: A | Discharge status: Home / Self Care | Payer: No Typology Code available for payment source | Attending: Emergency Medicine | Admitting: Emergency Medicine

## 2014-08-13 ENCOUNTER — Encounter (HOSPITAL_COMMUNITY): Payer: Self-pay | Admitting: Emergency Medicine

## 2014-08-13 ENCOUNTER — Emergency Department (HOSPITAL_COMMUNITY): Payer: No Typology Code available for payment source

## 2014-08-13 DIAGNOSIS — L03113 Cellulitis of right upper limb: Secondary | ICD-10-CM | POA: Insufficient documentation

## 2014-08-13 DIAGNOSIS — Z7952 Long term (current) use of systemic steroids: Secondary | ICD-10-CM | POA: Insufficient documentation

## 2014-08-13 DIAGNOSIS — Z8614 Personal history of Methicillin resistant Staphylococcus aureus infection: Secondary | ICD-10-CM | POA: Insufficient documentation

## 2014-08-13 DIAGNOSIS — Z792 Long term (current) use of antibiotics: Secondary | ICD-10-CM | POA: Insufficient documentation

## 2014-08-13 DIAGNOSIS — Z720 Tobacco use: Secondary | ICD-10-CM | POA: Insufficient documentation

## 2014-08-13 DIAGNOSIS — M79641 Pain in right hand: Secondary | ICD-10-CM

## 2014-08-13 LAB — CBC
HCT: 42.9 % (ref 36.0–46.0)
Hemoglobin: 13.9 g/dL (ref 12.0–15.0)
MCH: 31 pg (ref 26.0–34.0)
MCHC: 32.4 g/dL (ref 30.0–36.0)
MCV: 95.8 fL (ref 78.0–100.0)
Platelets: 185 10*3/uL (ref 150–400)
RBC: 4.48 MIL/uL (ref 3.87–5.11)
RDW: 14.2 % (ref 11.5–15.5)
WBC: 9.8 10*3/uL (ref 4.0–10.5)

## 2014-08-13 LAB — BASIC METABOLIC PANEL
Anion gap: 12 (ref 5–15)
BUN: 10 mg/dL (ref 6–23)
CO2: 27 mEq/L (ref 19–32)
Calcium: 9.1 mg/dL (ref 8.4–10.5)
Chloride: 102 mEq/L (ref 96–112)
Creatinine, Ser: 0.84 mg/dL (ref 0.50–1.10)
GFR calc Af Amer: >90 mL/min (ref >90)
GFR calc non Af Amer: >90 mL/min (ref >90)
Glucose, Bld: 83 mg/dL (ref 70–99)
Potassium: 4.6 mEq/L (ref 3.7–5.3)
Sodium: 141 mEq/L (ref 137–147)

## 2014-08-13 LAB — SEDIMENTATION RATE: Sed Rate: 2 mm/hr (ref 0–22)

## 2014-08-13 LAB — C-REACTIVE PROTEIN: CRP: 0.9 mg/dL — ABNORMAL HIGH (ref <0.60)

## 2014-08-13 MED ORDER — CLINDAMYCIN HCL 150 MG PO CAPS: 300.0000 mg | ORAL_CAPSULE | Freq: Four times a day (QID) | ORAL | Status: DC

## 2014-08-13 MED ORDER — CLINDAMYCIN PHOSPHATE 600 MG/50ML IV SOLN
600.0000 mg | Freq: Once | INTRAVENOUS | Status: AC
  Administered 2014-08-13: 600 mg via INTRAVENOUS
  Filled 2014-08-13: qty 50

## 2014-08-13 MED ORDER — KETOROLAC TROMETHAMINE 30 MG/ML IJ SOLN
30.0000 mg | Freq: Once | INTRAMUSCULAR | Status: AC
  Administered 2014-08-13: 30 mg via INTRAVENOUS
  Filled 2014-08-13: qty 1

## 2014-08-13 MED ORDER — IBUPROFEN 600 MG PO TABS: 600.0000 mg | ORAL_TABLET | Freq: Four times a day (QID) | ORAL | Status: DC | PRN

## 2014-08-13 MED ORDER — HYDROMORPHONE HCL 1 MG/ML IJ SOLN
1.0000 mg | Freq: Once | INTRAMUSCULAR | Status: AC
  Administered 2014-08-13: 1 mg via INTRAVENOUS
  Filled 2014-08-13: qty 1

## 2014-08-13 NOTE — ED Notes (Signed)
Pt appears to be asleep as well as her visitor sitting in a chair with his feet propped up in another chair by pt's stretcher; no needs at this time

## 2014-08-13 NOTE — ED Notes (Signed)
PA at bedside; stated will contact Child psychotherapistocial Worker for patient.

## 2014-08-13 NOTE — ED Notes (Signed)
Sore popped up on right hand the other day.  States it was a little sore but now it is worse.  It hurts just to breathe.  States I have had a lot of these areas and they cultured them and said it was MRSA.

## 2014-08-13 NOTE — ED Notes (Signed)
Pt resting, no needs at this time; turned lights down for pt to be more comfortable

## 2014-08-13 NOTE — ED Notes (Signed)
Boyfriend back in the ED at this time.  Patient and boyfriend together in the bathroom at this time.  Acting very suspicious.

## 2014-08-13 NOTE — ED Notes (Signed)
In room to start iv and draw blood.  States I need to use the bathroom first and I have to wait for my boyfriend to bring me a tampon.  Radiology staff here to take for xray.

## 2014-08-13 NOTE — ED Provider Notes (Signed)
CSN: [id number]     Arrival date & time 08/13/14  [id number] History   First MD Initiated Contact with Patient 08/13/14 [phone]     Chief Complaint  Patient presents with  . Arm Pain    right hand into right arm     (Consider location/radiation/quality/duration/timing/severity/associated sxs/prior Treatment) HPI  Pt is a 33yo female with hx of polysubstance abuse, IV drug use and abscesses, presenting to ED with c/o gradually worsening severe right hand pain radiating up her right arm.  Pain was initially sore and aching but now it is sharp and throbbing, 10/10, hurts to move. Pt states "it even hurts to breathe" because it causes her right arm to move, then causes pain. Denies fever, chills, n/v/d. Pt does report having multiple abscesses since March of this year. She had them cultured and was advised it was MRSA and prescribed an antibiotic but states she was unable to fill yesterday.  Last meal was around 11PM last night. No other significant PMH.   Pt states she is planning on going [facility] [place]. Reports her Kateri McUncle is sending her to detox.  Past Medical History  Diagnosis Date  . Polysubstance abuse   . IV drug user   . Abscess    Past Surgical History  Procedure Laterality Date  . Cesarean section  x 2   No family history on file. History  Substance Use Topics  . Smoking status: Current Every Day Smoker -- 1.00 packs/day    Types: Cigarettes  . Smokeless tobacco: Not on file  . Alcohol Use: No   OB History    No data available     Review of Systems  Constitutional: Negative for fever and chills.  Gastrointestinal: Negative for nausea, vomiting, abdominal pain and diarrhea.  Musculoskeletal: Positive for myalgias, joint swelling and arthralgias.       Right hand, wrist, and forearm  Skin: Positive for color change and wound. Negative for pallor and rash.  Neurological: Positive for weakness and numbness.  All other systems  reviewed and are negative.     Allergies  Review of patient's allergies indicates no known allergies.  Home Medications   Prior to Admission medications   Medication Sig Start Date End Date Taking? Authorizing Provider  clindamycin (CLEOCIN) 150 MG capsule Take 2 capsules (300 mg total) by mouth 4 (four) times daily. For 5 days 08/13/14   Junius FinnerErin O'Malley, PA-C  doxycycline (VIBRAMYCIN) 100 MG capsule Take 1 capsule (100 mg total) by mouth 2 (two) times daily. Patient not taking: Reported on 08/13/2014 07/14/14   Lyanne CoKevin M Campos, MD  hydrocortisone cream 1 % Apply to affected area 2 times daily Patient not taking: Reported on 07/19/2014 03/31/14   Marissa Sciacca, PA-C  ibuprofen (ADVIL,MOTRIN) 600 MG tablet Take 1 tablet (600 mg total) by mouth every 6 (six) hours as needed for moderate pain. 08/13/14   Junius FinnerErin O'Malley, PA-C   BP 118/70 mmHg  Pulse 81  Temp(Src) 98.4 F (36.9 C) (Oral)  Resp 20  Ht 5\' 7"  (1.702 m)  Wt 130 lb (58.968 kg)  BMI 20.36 kg/m2  SpO2 100%  LMP 06/07/2014 Physical Exam  Constitutional: She appears well-developed and well-nourished. No distress.  HENT:  Head: Normocephalic and atraumatic.  Eyes: Conjunctivae are normal. No scleral icterus.  Neck: Normal range of motion.  Cardiovascular: Normal rate, regular rhythm and normal heart sounds.   Pulses:      Radial pulses are 2+ on  the right side.  Right hand: cap refill <3 seconds  Pulmonary/Chest: Effort normal and breath sounds normal. No respiratory distress. She has no wheezes. She has no rales. She exhibits no tenderness.  Abdominal: Soft. Bowel sounds are normal. She exhibits no distension and no mass. There is no tenderness. There is no rebound and no guarding.  Musculoskeletal: She exhibits edema and tenderness.  Right hand: mild to moderate edema, worst at base of thumb. Limited ROM of wright and fingers due to severe pain.  Pt able to wiggle fingers but unable to make a fist or oppose her thumb and  fingers. Compartments are soft.   Right forearm: diffuse tenderness with palpation, compartments are soft. FROM right elbow.  No tenderness or edema or elbow.  Neurological: She is alert.  Right hand, limited movement and strength due to severe pain. Sensation in tact.  Skin: Skin is warm and dry. She is not diaphoretic. There is erythema.  Diffuse dried scaling lesion c/w small healing abscesses on bilateral hands, arms, and on face.  Nursing note and vitals reviewed.         ED Course  Procedures (including critical care time) Labs Review Labs Reviewed  CBC  BASIC METABOLIC PANEL  SEDIMENTATION RATE  C-REACTIVE PROTEIN    Imaging Review Dg Wrist Complete Right  08/13/2014   CLINICAL DATA:  Acute onset of right hand and wrist pain, with swelling along the posterior aspect of the wrist and mild bruising. Initial encounter.  EXAM: RIGHT WRIST - COMPLETE 3+ VIEW  COMPARISON:  Right wrist radiographs performed 09/11/2012  FINDINGS: There is no evidence of fracture or dislocation. No osseous erosions are seen. The carpal rows are intact, and demonstrate normal alignment. The joint spaces are preserved.  Mild dorsal soft tissue swelling is noted at the wrist. No radiopaque foreign bodies are seen.  IMPRESSION: No evidence of fracture or dislocation. No osseous erosions identified.   Electronically Signed   By: Roanna RaiderJeffery  Chang M.D.   On: 08/13/2014 07:02   Dg Hand Complete Right  08/13/2014   CLINICAL DATA:  Acute onset of right hand and wrist pain, with swelling along the posterior aspect of the wrist and mild bruising. Initial encounter.  EXAM: RIGHT HAND - COMPLETE 3+ VIEW  COMPARISON:  Right wrist radiographs performed 09/11/2012  FINDINGS: There is no evidence of fracture or dislocation. No osseous erosions are seen.  The joint spaces are preserved; mild diffuse soft tissue swelling is noted along the dorsum of the wrist and hand. The carpal rows are intact, and demonstrate normal  alignment. A metallic ring is noted overlying the third finger.  IMPRESSION: No evidence of fracture or dislocation.   Electronically Signed   By: Roanna RaiderJeffery  Chang M.D.   On: 08/13/2014 07:01     EKG Interpretation None      MDM   Final diagnoses:  Cellulitis of right hand  Right hand pain    Pt with hx of IV drug use and recent diagnosis of MRSA due to various abscesses developing on her body since March of this year, presenting to ED with c/o gradually worsening, severe right hand pain.  Right hand is mild to moderately edematous with erythema extending into right wrist.  Pt given IV dilaudid due to severity of pain, however, do not plan on discharging pt home on narcotics.  Due to severe pain with movement, erythema, edema, and tenderness, concern for septic joint of 1st MCP. However, right hand is neurovascularly in tact, compartments  are soft. Not concerned for compartment syndrome at this time.  Labs: unremarkable.  No leukocytosis, Sed rate- WNL at 2.   No evidence of emergent process taking place at this time.  Will tx for cellulitis.  IV clindamycin as well as toradol given.    Consulted with social work, pt will be able to purchase her antibiotics at [facility] or [facility] for no more than $13  9:55 AM Went to discuss coupons for Clindamycin with pt, pt sleeping comfortably in exam bed, NAD. Advised pt to f/u tomorrow with her PCP, urgent care or ED for recheck of right hand cellulitis. Stressed the importance of starting her Clindamycin ASAP as she needs antibiotics in order to treat her infection which will then help with the pain.  Pt was placed in a wrist splint for comfort. Home care instructions provided. Return precautions provided. Provided 2 coupons for Clindamycin, one for [facility] for $12.29 and a second for [facility] for $11.79 as pt reports plan to start rehab on [facility] tomorrow.    Discussed pt with Dr. Radford Pax who also examined pt, agrees with assessment and  plan.       Junius Finner, PA-C 08/13/14 1207  Nelia Shi, MD 08/13/14 [phone]

## 2014-08-29 ENCOUNTER — Telehealth (HOSPITAL_COMMUNITY): Payer: Self-pay

## 2014-12-19 ENCOUNTER — Encounter (HOSPITAL_COMMUNITY): Payer: Self-pay | Admitting: Physical Medicine and Rehabilitation

## 2014-12-19 ENCOUNTER — Emergency Department (HOSPITAL_COMMUNITY)
Admission: EM | Admit: 2014-12-19 | Discharge: 2014-12-19 | Disposition: A | Discharge status: Home / Self Care | Payer: Self-pay | Attending: Emergency Medicine | Admitting: Emergency Medicine

## 2014-12-19 DIAGNOSIS — Z872 Personal history of diseases of the skin and subcutaneous tissue: Secondary | ICD-10-CM | POA: Insufficient documentation

## 2014-12-19 DIAGNOSIS — Z720 Tobacco use: Secondary | ICD-10-CM | POA: Insufficient documentation

## 2014-12-19 DIAGNOSIS — L089 Local infection of the skin and subcutaneous tissue, unspecified: Secondary | ICD-10-CM | POA: Insufficient documentation

## 2014-12-19 MED ORDER — CLINDAMYCIN PHOSPHATE 1 % EX GEL: Freq: Two times a day (BID) | CUTANEOUS | Status: DC

## 2014-12-19 MED ORDER — CLINDAMYCIN HCL 150 MG PO CAPS: 300.0000 mg | ORAL_CAPSULE | Freq: Three times a day (TID) | ORAL | Status: DC

## 2014-12-19 NOTE — ED Provider Notes (Signed)
CSN: [id number]     Arrival date & time 12/19/14  1153 History  This chart was scribed for non-physician practitioner, Emilia Beck, working with Samuel Jester, DO by Richarda Overlie, ED Scribe. This patient was seen in room TR07C/TR07C and the patient's care was started at 12:34 PM.  Chief Complaint  Patient presents with  . Rash   The history is provided by the patient. No language interpreter was used.   HPI Comments: Tiffany Salazar is a 34 y.o. female with a history of polysubstance abuse who presents to the Emergency Department complaining of a facial rash around her mouth and jaw for the last 2 weeks. She reports that she did have other rashes on arms, ears, torso and legs. She states that she was dx with MRSA in 12/15 and was treated with clindamycin with relief. Pt states she was using IV drugs then detoxed herself about 2 weeks ago. She states that her rash flare up occurred at that time. She reports that she is currently using IV drugs but is planning on going to rehab in the near future. Pt reports no alleviating or exacerbating factors at this time.   Past Medical History  Diagnosis Date  . Polysubstance abuse   . IV drug user   . Abscess    Past Surgical History  Procedure Laterality Date  . Cesarean section  x 2   No family history on file. History  Substance Use Topics  . Smoking status: Current Every Day Smoker -- 1.00 packs/day    Types: Cigarettes  . Smokeless tobacco: Not on file  . Alcohol Use: No   OB History    No data available     Review of Systems  Skin: Positive for rash.  All other systems reviewed and are negative.  Allergies  Review of patient's allergies indicates no known allergies.  Home Medications   Prior to Admission medications   Medication Sig Start Date End Date Taking? Authorizing Provider  clindamycin (CLEOCIN) 150 MG capsule Take 2 capsules (300 mg total) by mouth 4 (four) times daily. For 5 days 08/13/14   Junius Finner,  PA-C  doxycycline (VIBRAMYCIN) 100 MG capsule Take 1 capsule (100 mg total) by mouth 2 (two) times daily. Patient not taking: Reported on 08/13/2014 07/14/14   Azalia Bilis, MD  hydrocortisone cream 1 % Apply to affected area 2 times daily Patient not taking: Reported on 07/19/2014 03/31/14   Marissa Sciacca, PA-C  ibuprofen (ADVIL,MOTRIN) 600 MG tablet Take 1 tablet (600 mg total) by mouth every 6 (six) hours as needed for moderate pain. 08/13/14   Junius Finner, PA-C   BP 136/78 mmHg  Pulse 85  Temp(Src) 98.8 F (37.1 C) (Oral)  Resp 18  SpO2 99%   Physical Exam  Constitutional: She is oriented to person, place, and time. She appears well-developed and well-nourished.  HENT:  Head: Normocephalic and atraumatic.  Eyes: Right eye exhibits no discharge. Left eye exhibits no discharge.  Neck: Neck supple. No tracheal deviation present.  Cardiovascular: Normal rate.   Pulmonary/Chest: Effort normal. No respiratory distress.  Abdominal: She exhibits no distension. There is no tenderness. There is no rebound.  Neurological: She is alert and oriented to person, place, and time. Coordination normal.  Skin: Skin is warm and dry.  Scabbed lesions with surrounding erythema of bilateral jaw line.   Psychiatric: She has a normal mood and affect. Her behavior is normal.  Nursing note and vitals reviewed.   ED Course  Procedures  DIAGNOSTIC STUDIES: Oxygen Saturation is 99% on RA, normal by my interpretation.    COORDINATION OF CARE: 12:31 PM Discussed treatment plan with pt at bedside and pt agreed to plan.   Labs Review Labs Reviewed - No data to display  Imaging Review No results found.   EKG Interpretation None      MDM   Final diagnoses:  Skin infection   Patient will have clindamycin PO and clindamycin gel. Patient advised to stop using drugs. Vitals stable and patient afebrile.   I personally performed the services described in this documentation, which was scribed in  my presence. The recorded information has been reviewed and is accurate.      Emilia BeckKaitlyn Leina Babe, PA-C 12/19/14 1306  Samuel JesterKathleen McManus, DO 12/20/14 [phone]

## 2014-12-19 NOTE — ED Notes (Addendum)
Flare up when she "detoxed myself about 2 weeks ago from heroin".  Patient admits to daily cocaine.  Patient states she wants to be admitted for antibiotic treatment.

## 2014-12-19 NOTE — Discharge Instructions (Signed)
Take clindamycin as directed until gone. Use Clindamycin gel as directed. Return to the ED with worsening or concerning symptoms.

## 2014-12-19 NOTE — ED Notes (Signed)
Pt states she was seen here in Dec rash and treated with Clindamycin . States had "blood drawn and told she had MRSA". States the rash returned 1 week ago. Pt has large open sores on her face.

## 2014-12-19 NOTE — ED Notes (Signed)
Pt to department for evaluation of rash to face. Reports history of MRSA. Currently using cocaine, history of IV drug abuse. Pt is alert and oriented x4. No signs of distress noted.

## 2015-01-20 ENCOUNTER — Emergency Department (HOSPITAL_COMMUNITY)
Admission: EM | Admit: 2015-01-20 | Discharge: 2015-01-20 | Disposition: A | Discharge status: Home / Self Care | Payer: Self-pay | Attending: Emergency Medicine | Admitting: Emergency Medicine

## 2015-01-20 ENCOUNTER — Encounter (HOSPITAL_COMMUNITY): Payer: Self-pay | Admitting: *Deleted

## 2015-01-20 DIAGNOSIS — Z8614 Personal history of Methicillin resistant Staphylococcus aureus infection: Secondary | ICD-10-CM | POA: Insufficient documentation

## 2015-01-20 DIAGNOSIS — L0201 Cutaneous abscess of face: Secondary | ICD-10-CM

## 2015-01-20 DIAGNOSIS — Z720 Tobacco use: Secondary | ICD-10-CM | POA: Insufficient documentation

## 2015-01-20 DIAGNOSIS — Z792 Long term (current) use of antibiotics: Secondary | ICD-10-CM | POA: Insufficient documentation

## 2015-01-20 MED ORDER — FLUCONAZOLE 200 MG PO TABS: 200.0000 mg | ORAL_TABLET | Freq: Every day | ORAL | Status: DC

## 2015-01-20 MED ORDER — DOXYCYCLINE HYCLATE 100 MG PO CAPS: 100.0000 mg | ORAL_CAPSULE | Freq: Two times a day (BID) | ORAL | Status: DC

## 2015-01-20 NOTE — ED Notes (Signed)
PT was seen here 1.5 years ago and she had these spots on her and she told them she used drug and was told she had MRSA.  Pt was given another antibiotic and it was spreading on face and this did not help.  She is back again with it on her face.  Pt has an abscess to left lower face that is draining.

## 2015-01-20 NOTE — ED Provider Notes (Signed)
CSN: [id number]     Arrival date & time 01/20/15  1441 History  This chart was scribed for non-physician practitioner, Junius Finner, PA-C working with Donnetta Hutching, MD, by Jarvis Morgan, ED Scribe. This patient was seen in room TR09C/TR09C and the patient's care was started at 4:12 PM.    Chief Complaint  Patient presents with  . Abscess    The history is provided by the patient. No language interpreter was used.    HPI Comments: Tiffany Salazar is a 34 y.o. female who presents to the Emergency Department complaining of an red, swollen, abscess to her left jaw for 2 months. Pt admits she is a former substance abuse user and believes she contracted MRSA during that time. She notes she has a h/o of abscesses in the past. Pt claims she has been treated previously with an antibiotic, per medical records, pt had clindamycin last month, and she reports that helped to resolve the abscess but then it came back. She states the area has been draining yellow pus. Pt denies any pain to the area. Pt states she has been using Bactrim spray with mild relief. She denies any fevers, nausea, vomiting, diarrhea.   Past Medical History  Diagnosis Date  . Polysubstance abuse   . IV drug user   . Abscess   . History of MRSA infection    Past Surgical History  Procedure Laterality Date  . Cesarean section  x 2   No family history on file. History  Substance Use Topics  . Smoking status: Current Every Day Smoker -- 1.00 packs/day    Types: Cigarettes  . Smokeless tobacco: Not on file  . Alcohol Use: No   OB History    No data available     Review of Systems  Constitutional: Negative for fever and chills.  Gastrointestinal: Negative for nausea, vomiting and diarrhea.  Skin: Positive for color change (redness to abscess) and wound (abscess to left side of jaw).      Allergies  Review of patient's allergies indicates no known allergies.  Home Medications   Prior to Admission medications    Medication Sig Start Date End Date Taking? Authorizing Provider  clindamycin (CLEOCIN) 150 MG capsule Take 2 capsules (300 mg total) by mouth 3 (three) times daily. May dispense as  capsules 12/19/14   Kaitlyn Szekalski, PA-C  clindamycin (CLINDAGEL) 1 % gel Apply topically 2 (two) times daily. 12/19/14   Emilia Beck, PA-C  doxycycline (VIBRAMYCIN) 100 MG capsule Take 1 capsule (100 mg total) by mouth 2 (two) times daily. Patient not taking: Reported on 08/13/2014 07/14/14   Azalia Bilis, MD  doxycycline (VIBRAMYCIN) 100 MG capsule Take 1 capsule (100 mg total) by mouth 2 (two) times daily. One po bid x 7 days 01/20/15   Junius Finner, PA-C  fluconazole (DIFLUCAN) 200 MG tablet Take 1 tablet (200 mg total) by mouth daily. Take 1 pill one time per yeast infection 01/20/15   Junius Finner, PA-C  hydrocortisone cream 1 % Apply to affected area 2 times daily Patient not taking: Reported on 07/19/2014 03/31/14   Marissa Sciacca, PA-C  ibuprofen (ADVIL,MOTRIN) 600 MG tablet Take 1 tablet (600 mg total) by mouth every 6 (six) hours as needed for moderate pain. 08/13/14   Junius Finner, PA-C   Triage Vitals: BP 119/82 mmHg  Pulse 104  Temp(Src) 98.5 F (36.9 C) (Oral)  Resp 18  SpO2 98%  LMP 01/06/2015  Physical Exam  Constitutional: She is oriented to person,  place, and time. She appears well-developed and well-nourished. No distress.  HENT:  Head: Normocephalic and atraumatic.  Eyes: Conjunctivae and EOM are normal.  Neck: Neck supple. No tracheal deviation present.  Cardiovascular: Normal rate.   Pulmonary/Chest: Effort normal. No respiratory distress.  Musculoskeletal: Normal range of motion.  Neurological: She is alert and oriented to person, place, and time.  Skin: Skin is warm and dry.  1 cm circular area of erythema, centralized opening, actively draining scant yellow pus, mild tenderness to touch  Psychiatric: She has a normal mood and affect. Her behavior is normal.  Nursing  note and vitals reviewed.   ED Course  Procedures (including critical care time)  DIAGNOSTIC STUDIES: Oxygen Saturation is 98% on RA, normal by my interpretation.    COORDINATION OF CARE: 4:22 PM- Will prescribe pt with doxycycline. Pt advised of plan for treatment and pt agrees.  Labs Review Labs Reviewed - No data to display  Imaging Review No results found.   EKG Interpretation None      MDM   Final diagnoses:  Facial abscess    Pt is a 34yo female presenting to ED with draining recurrent abscess to left side of face.  Abscess is actively draining.  No indication for additional I&D at this time. Pt was on clindamycin last month. Will place pt on doxycycline. Encouraged to keep cleaning wound as she has.  Also advised to f/u with PCP and likely f/u with dermatology as abscesses keep recurring. Return precautions provided. Pt verbalized understanding and agreement with tx plan.   I personally performed the services described in this documentation, which was scribed in my presence. The recorded information has been reviewed and is accurate.     Junius Finnerrin O'Malley, PA-C 01/20/15 2142  Donnetta HutchingBrian Cook, MD 01/20/15 [phone]

## 2015-01-20 NOTE — ED Notes (Addendum)
Pt c/o abscess to L jaw for unknown amount of time. Pt denies pain since "I pulled all the black s--- out." Pt with hx of MRSA, reports  "wanting the the strongest antibiotic you've got." Dime size area noted to be open, red, swollen, with yellow drainage

## 2015-03-02 ENCOUNTER — Inpatient Hospital Stay (HOSPITAL_COMMUNITY)
Admission: AD | Admit: 2015-03-02 | Discharge: 2015-03-02 | Disposition: A | Discharge status: Home / Self Care | Payer: Medicaid Other | Source: Ambulatory Visit | Attending: Obstetrics and Gynecology | Admitting: Obstetrics and Gynecology

## 2015-03-02 ENCOUNTER — Encounter (HOSPITAL_COMMUNITY): Payer: Self-pay

## 2015-03-02 DIAGNOSIS — Z3201 Encounter for pregnancy test, result positive: Secondary | ICD-10-CM

## 2015-03-02 DIAGNOSIS — N926 Irregular menstruation, unspecified: Secondary | ICD-10-CM

## 2015-03-02 DIAGNOSIS — Z3200 Encounter for pregnancy test, result unknown: Secondary | ICD-10-CM | POA: Diagnosis present

## 2015-03-02 LAB — RAPID URINE DRUG SCREEN, HOSP PERFORMED
Amphetamines: NOT DETECTED
Barbiturates: NOT DETECTED
Benzodiazepines: NOT DETECTED
Cocaine: POSITIVE — AB
Opiates: POSITIVE — AB
Tetrahydrocannabinol: POSITIVE — AB

## 2015-03-02 LAB — POCT PREGNANCY, URINE: Preg Test, Ur: POSITIVE — AB

## 2015-03-02 NOTE — MAU Provider Note (Signed)
Subjective:  [name] is a 34 y.o. female G1P0 at 632w3d presenting for a pregnancy verification letter. She denies pain or bleeding at this time.    Objective:  GENERAL: Well-developed, well-nourished female in no acute distress.  LUNGS: Effort normal SKIN: Warm, dry and without erythema PSYCH: Normal mood and affect  Filed Vitals:   03/02/15 1418  BP: 103/68  Pulse: 87  Temp: 98.3 F (36.8 C)  Resp: 16   Results for orders placed or performed during the hospital encounter of 03/02/15 (from the past 48 hour(s))  Pregnancy, urine POC     Status: Abnormal   Collection Time: 03/02/15  2:29 PM  Result Value Ref Range   Preg Test, Ur POSITIVE (A) NEGATIVE    Comment:        THE SENSITIVITY OF THIS METHODOLOGY IS >24 mIU/mL     Assessment:  1. Missed period   2. Encounter for pregnancy test, result positive     Plan:  Discharge home in stable condition First trimester warning signs discussed Start prenatal care ASAP    Tiffany LopeJennifer I Rasch, NP 03/02/2015 3:05 PM

## 2015-03-02 NOTE — MAU Note (Signed)
Pt states was told by [facility] to come here for verification letter of pregnancy. Denies bleeding or abnormal vaginal discharge.

## 2015-03-20 ENCOUNTER — Other Ambulatory Visit: Payer: Self-pay | Admitting: Obstetrics & Gynecology

## 2015-03-20 DIAGNOSIS — Z3490 Encounter for supervision of normal pregnancy, unspecified, unspecified trimester: Secondary | ICD-10-CM

## 2015-03-20 NOTE — Progress Notes (Signed)
Made initial prenatal visit for pt to be seen at [facility] 03/21/15.

## 2015-03-20 NOTE — Progress Notes (Signed)
Pt under care at [facility].  Need viability [place].  Will order and patient to seek prenatal care [facility]?  Pt lives in [place].  [facility] or [facility] will arrange.

## 2015-03-21 ENCOUNTER — Ambulatory Visit (INDEPENDENT_AMBULATORY_CARE_PROVIDER_SITE_OTHER): Payer: Medicaid Other | Admitting: Family Medicine

## 2015-03-21 ENCOUNTER — Encounter: Payer: Self-pay | Admitting: Family Medicine

## 2015-03-21 ENCOUNTER — Other Ambulatory Visit (HOSPITAL_COMMUNITY)
Admission: RE | Admit: 2015-03-21 | Discharge: 2015-03-21 | Disposition: A | Discharge status: Home / Self Care | Payer: Medicaid Other | Source: Ambulatory Visit | Attending: Family Medicine | Admitting: Family Medicine

## 2015-03-21 VITALS — BP 108/64 | HR 83 | Wt 141.0 lb

## 2015-03-21 DIAGNOSIS — O34219 Maternal care for unspecified type scar from previous cesarean delivery: Secondary | ICD-10-CM | POA: Insufficient documentation

## 2015-03-21 DIAGNOSIS — Z113 Encounter for screening for infections with a predominantly sexual mode of transmission: Secondary | ICD-10-CM | POA: Insufficient documentation

## 2015-03-21 DIAGNOSIS — F1120 Opioid dependence, uncomplicated: Secondary | ICD-10-CM | POA: Insufficient documentation

## 2015-03-21 DIAGNOSIS — O3421 Maternal care for scar from previous cesarean delivery: Secondary | ICD-10-CM | POA: Diagnosis not present

## 2015-03-21 DIAGNOSIS — A4902 Methicillin resistant Staphylococcus aureus infection, unspecified site: Secondary | ICD-10-CM | POA: Diagnosis not present

## 2015-03-21 DIAGNOSIS — B373 Candidiasis of vulva and vagina: Secondary | ICD-10-CM | POA: Diagnosis not present

## 2015-03-21 DIAGNOSIS — Z1151 Encounter for screening for human papillomavirus (HPV): Secondary | ICD-10-CM | POA: Diagnosis present

## 2015-03-21 DIAGNOSIS — O99321 Drug use complicating pregnancy, first trimester: Secondary | ICD-10-CM

## 2015-03-21 DIAGNOSIS — B3731 Acute candidiasis of vulva and vagina: Secondary | ICD-10-CM

## 2015-03-21 DIAGNOSIS — O0990 Supervision of high risk pregnancy, unspecified, unspecified trimester: Secondary | ICD-10-CM | POA: Insufficient documentation

## 2015-03-21 DIAGNOSIS — Z01419 Encounter for gynecological examination (general) (routine) without abnormal findings: Secondary | ICD-10-CM | POA: Insufficient documentation

## 2015-03-21 DIAGNOSIS — O99320 Drug use complicating pregnancy, unspecified trimester: Secondary | ICD-10-CM

## 2015-03-21 DIAGNOSIS — O0991 Supervision of high risk pregnancy, unspecified, first trimester: Secondary | ICD-10-CM

## 2015-03-21 HISTORY — DX: Methicillin resistant Staphylococcus aureus infection, unspecified site: A49.02

## 2015-03-21 MED ORDER — CLINDAMYCIN HCL 150 MG PO CAPS: 300.0000 mg | ORAL_CAPSULE | Freq: Three times a day (TID) | ORAL | Status: DC

## 2015-03-21 MED ORDER — TERCONAZOLE 0.4 % VA CREA: 1.0000 | TOPICAL_CREAM | Freq: Every day | VAGINAL | Status: DC

## 2015-03-21 MED ORDER — CLINDAMYCIN PHOSPHATE 1 % EX GEL: Freq: Two times a day (BID) | CUTANEOUS | Status: DC

## 2015-03-21 NOTE — Patient Instructions (Signed)
First Trimester of Pregnancy The first trimester of pregnancy is from week 1 until the end of week 12 (months 1 through 3). A week after a sperm fertilizes an egg, the egg will implant on the wall of the uterus. This embryo will begin to develop into a baby. Genes from you and your partner are forming the baby. The female genes determine whether the baby is a boy or a girl. At 6-8 weeks, the eyes and face are formed, and the heartbeat can be seen on ultrasound. At the end of 12 weeks, all the baby's organs are formed.  Now that you are pregnant, you will want to do everything you can to have a healthy baby. Two of the most important things are to get good prenatal care and to follow your health care provider's instructions. Prenatal care is all the medical care you receive before the baby's birth. This care will help prevent, find, and treat any problems during the pregnancy and childbirth. BODY CHANGES Your body goes through many changes during pregnancy. The changes vary from woman to woman.   You may gain or lose a couple of pounds at first.  You may feel sick to your stomach (nauseous) and throw up (vomit). If the vomiting is uncontrollable, call your health care provider.  You may tire easily.  You may develop headaches that can be relieved by medicines approved by your health care provider.  You may urinate more often. Painful urination may mean you have a bladder infection.  You may develop heartburn as a result of your pregnancy.  You may develop constipation because certain hormones are causing the muscles that push waste through your intestines to slow down.  You may develop hemorrhoids or swollen, bulging veins (varicose veins).  Your breasts may begin to grow larger and become tender. Your nipples may stick out more, and the tissue that surrounds them (areola) may become darker.  Your gums may bleed and may be sensitive to brushing and flossing.  Dark spots or blotches  (chloasma, mask of pregnancy) may develop on your face. This will likely fade after the baby is born.  Your menstrual periods will stop.  You may have a loss of appetite.  You may develop cravings for certain kinds of food.  You may have changes in your emotions from day to day, such as being excited to be pregnant or being concerned that something may go wrong with the pregnancy and baby.  You may have more vivid and strange dreams.  You may have changes in your hair. These can include thickening of your hair, rapid growth, and changes in texture. Some women also have hair loss during or after pregnancy, or hair that feels dry or thin. Your hair will most likely return to normal after your baby is born. WHAT TO EXPECT AT YOUR PRENATAL VISITS During a routine prenatal visit:  You will be weighed to make sure you and the baby are growing normally.  Your blood pressure will be taken.  Your abdomen will be measured to track your baby's growth.  The fetal heartbeat will be listened to starting around week 10 or 12 of your pregnancy.  Test results from any previous visits will be discussed. Your health care provider may ask you:  How you are feeling.  If you are feeling the baby move.  If you have had any abnormal symptoms, such as leaking fluid, bleeding, severe headaches, or abdominal cramping.  If you have any questions. Other tests   that may be performed during your first trimester include:  Blood tests to find your blood type and to check for the presence of any previous infections. They will also be used to check for low iron levels (anemia) and Rh antibodies. Later in the pregnancy, blood tests for diabetes will be done along with other tests if problems develop.  Urine tests to check for infections, diabetes, or protein in the urine.  An ultrasound to confirm the proper growth and development of the baby.  An amniocentesis to check for possible genetic problems.  Fetal  screens for spina bifida and Down syndrome.  You may need other tests to make sure you and the baby are doing well. HOME CARE INSTRUCTIONS  Medicines  Follow your health care provider's instructions regarding medicine use. Specific medicines may be either safe or unsafe to take during pregnancy.  Take your prenatal vitamins as directed.  If you develop constipation, try taking a stool softener if your health care provider approves. Diet  Eat regular, well-balanced meals. Choose a variety of foods, such as meat or vegetable-based protein, fish, milk and low-fat dairy products, vegetables, fruits, and whole grain breads and cereals. Your health care provider will help you determine the amount of weight gain that is right for you.  Avoid raw meat and uncooked cheese. These carry germs that can cause birth defects in the baby.  Eating four or five small meals rather than three large meals a day may help relieve nausea and vomiting. If you start to feel nauseous, eating a few soda crackers can be helpful. Drinking liquids between meals instead of during meals also seems to help nausea and vomiting.  If you develop constipation, eat more high-fiber foods, such as fresh vegetables or fruit and whole grains. Drink enough fluids to keep your urine clear or pale yellow. Activity and Exercise  Exercise only as directed by your health care provider. Exercising will help you:  Control your weight.  Stay in shape.  Be prepared for labor and delivery.  Experiencing pain or cramping in the lower abdomen or low back is a good sign that you should stop exercising. Check with your health care provider before continuing normal exercises.  Try to avoid standing for long periods of time. Move your legs often if you must stand in one place for a long time.  Avoid heavy lifting.  Wear low-heeled shoes, and practice good posture.  You may continue to have sex unless your health care provider directs you  otherwise. Relief of Pain or Discomfort  Wear a good support bra for breast tenderness.   Take warm sitz baths to soothe any pain or discomfort caused by hemorrhoids. Use hemorrhoid cream if your health care provider approves.   Rest with your legs elevated if you have leg cramps or low back pain.  If you develop varicose veins in your legs, wear support hose. Elevate your feet for 15 minutes, 3-4 times a day. Limit salt in your diet. Prenatal Care  Schedule your prenatal visits by the twelfth week of pregnancy. They are usually scheduled monthly at first, then more often in the last 2 months before delivery.  Write down your questions. Take them to your prenatal visits.  Keep all your prenatal visits as directed by your health care provider. Safety  Wear your seat belt at all times when driving.  Make a list of emergency phone numbers, including numbers for family, friends, the hospital, and police and fire departments. General Tips    Ask your health care provider for a referral to a local prenatal education class. Begin classes no later than at the beginning of month 6 of your pregnancy.  Ask for help if you have counseling or nutritional needs during pregnancy. Your health care provider can offer advice or refer you to specialists for help with various needs.  Do not use hot tubs, steam rooms, or saunas.  Do not douche or use tampons or scented sanitary pads.  Do not cross your legs for long periods of time.  Avoid cat litter boxes and soil used by cats. These carry germs that can cause birth defects in the baby and possibly loss of the fetus by miscarriage or stillbirth.  Avoid all smoking, herbs, alcohol, and medicines not prescribed by your health care provider. Chemicals in these affect the formation and growth of the baby.  Schedule a dentist appointment. At home, brush your teeth with a soft toothbrush and be gentle when you floss. SEEK MEDICAL CARE IF:   You have  dizziness.  You have mild pelvic cramps, pelvic pressure, or nagging pain in the abdominal area.  You have persistent nausea, vomiting, or diarrhea.  You have a bad smelling vaginal discharge.  You have pain with urination.  You notice increased swelling in your face, hands, legs, or ankles. SEEK IMMEDIATE MEDICAL CARE IF:   You have a fever.  You are leaking fluid from your vagina.  You have spotting or bleeding from your vagina.  You have severe abdominal cramping or pain.  You have rapid weight gain or loss.  You vomit blood or material that looks like coffee grounds.  You are exposed to German measles and have never had them.  You are exposed to fifth disease or chickenpox.  You develop a severe headache.  You have shortness of breath.  You have any kind of trauma, such as from a fall or a car accident. Document Released: 08/05/2001 Document Revised: 12/26/2013 Document Reviewed: 06/21/2013 [employer] Patient Information 2015 [employer]. This information is not intended to replace advice given to you by your health care provider. Make sure you discuss any questions you have with your health care provider.  Breastfeeding Deciding to breastfeed is one of the best choices you can make for you and your baby. A change in hormones during pregnancy causes your breast tissue to grow and increases the number and size of your milk ducts. These hormones also allow proteins, sugars, and fats from your blood supply to make breast milk in your milk-producing glands. Hormones prevent breast milk from being released before your baby is born as well as prompt milk flow after birth. Once breastfeeding has begun, thoughts of your baby, as well as his or her sucking or crying, can stimulate the release of milk from your milk-producing glands.  BENEFITS OF BREASTFEEDING For Your Baby  Your first milk (colostrum) helps your baby's digestive system function better.   There are antibodies  in your milk that help your baby fight off infections.   Your baby has a lower incidence of asthma, allergies, and sudden infant death syndrome.   The nutrients in breast milk are better for your baby than infant formulas and are designed uniquely for your baby's needs.   Breast milk improves your baby's brain development.   Your baby is less likely to develop other conditions, such as childhood obesity, asthma, or type 2 diabetes mellitus.  For You   Breastfeeding helps to create a very special bond between   you and your baby.   Breastfeeding is convenient. Breast milk is always available at the correct temperature and costs nothing.   Breastfeeding helps to burn calories and helps you lose the weight gained during pregnancy.   Breastfeeding makes your uterus contract to its prepregnancy size faster and slows bleeding (lochia) after you give birth.   Breastfeeding helps to lower your risk of developing type 2 diabetes mellitus, osteoporosis, and breast or ovarian cancer later in life. SIGNS THAT YOUR BABY IS HUNGRY Early Signs of Hunger  Increased alertness or activity.  Stretching.  Movement of the head from side to side.  Movement of the head and opening of the mouth when the corner of the mouth or cheek is stroked (rooting).  Increased sucking sounds, smacking lips, cooing, sighing, or squeaking.  Hand-to-mouth movements.  Increased sucking of fingers or hands. Late Signs of Hunger  Fussing.  Intermittent crying. Extreme Signs of Hunger Signs of extreme hunger will require calming and consoling before your baby will be able to breastfeed successfully. Do not wait for the following signs of extreme hunger to occur before you initiate breastfeeding:   Restlessness.  A loud, strong cry.   Screaming. BREASTFEEDING BASICS Breastfeeding Initiation  Find a comfortable place to sit or lie down, with your neck and back well supported.  Place a pillow or  rolled up blanket under your baby to bring him or her to the level of your breast (if you are seated). Nursing pillows are specially designed to help support your arms and your baby while you breastfeed.  Make sure that your baby's abdomen is facing your abdomen.   Gently massage your breast. With your fingertips, massage from your chest wall toward your nipple in a circular motion. This encourages milk flow. You may need to continue this action during the feeding if your milk flows slowly.  Support your breast with 4 fingers underneath and your thumb above your nipple. Make sure your fingers are well away from your nipple and your baby's mouth.   Stroke your baby's lips gently with your finger or nipple.   When your baby's mouth is open wide enough, quickly bring your baby to your breast, placing your entire nipple and as much of the colored area around your nipple (areola) as possible into your baby's mouth.   More areola should be visible above your baby's upper lip than below the lower lip.   Your baby's tongue should be between his or her lower gum and your breast.   Ensure that your baby's mouth is correctly positioned around your nipple (latched). Your baby's lips should create a seal on your breast and be turned out (everted).  It is common for your baby to suck about 2-3 minutes in order to start the flow of breast milk. Latching Teaching your baby how to latch on to your breast properly is very important. An improper latch can cause nipple pain and decreased milk supply for you and poor weight gain in your baby. Also, if your baby is not latched onto your nipple properly, he or she may swallow some air during feeding. This can make your baby fussy. Burping your baby when you switch breasts during the feeding can help to get rid of the air. However, teaching your baby to latch on properly is still the best way to prevent fussiness from swallowing air while breastfeeding. Signs  that your baby has successfully latched on to your nipple:    Silent tugging or silent   sucking, without causing you pain.   Swallowing heard between every 3-4 sucks.    Muscle movement above and in front of his or her ears while sucking.  Signs that your baby has not successfully latched on to nipple:   Sucking sounds or smacking sounds from your baby while breastfeeding.  Nipple pain. If you think your baby has not latched on correctly, slip your finger into the corner of your baby's mouth to break the suction and place it between your baby's gums. Attempt breastfeeding initiation again. Signs of Successful Breastfeeding Signs from your baby:   A gradual decrease in the number of sucks or complete cessation of sucking.   Falling asleep.   Relaxation of his or her body.   Retention of a small amount of milk in his or her mouth.   Letting go of your breast by himself or herself. Signs from you:  Breasts that have increased in firmness, weight, and size 1-3 hours after feeding.   Breasts that are softer immediately after breastfeeding.  Increased milk volume, as well as a change in milk consistency and color by the fifth day of breastfeeding.   Nipples that are not sore, cracked, or bleeding. Signs That Your Baby is Getting Enough Milk  Wetting at least 3 diapers in a 24-hour period. The urine should be clear and pale yellow by age 5 days.  At least 3 stools in a 24-hour period by age 5 days. The stool should be soft and yellow.  At least 3 stools in a 24-hour period by age 7 days. The stool should be seedy and yellow.  No loss of weight greater than 10% of birth weight during the first 3 days of age.  Average weight gain of 4-7 ounces (113-198 g) per week after age 4 days.  Consistent daily weight gain by age 5 days, without weight loss after the age of 2 weeks. After a feeding, your baby may spit up a small amount. This is common. BREASTFEEDING FREQUENCY AND  DURATION Frequent feeding will help you make more milk and can prevent sore nipples and breast engorgement. Breastfeed when you feel the need to reduce the fullness of your breasts or when your baby shows signs of hunger. This is called "breastfeeding on demand." Avoid introducing a pacifier to your baby while you are working to establish breastfeeding (the first 4-6 weeks after your baby is born). After this time you may choose to use a pacifier. Research has shown that pacifier use during the first year of a baby's life decreases the risk of sudden infant death syndrome (SIDS). Allow your baby to feed on each breast as long as he or she wants. Breastfeed until your baby is finished feeding. When your baby unlatches or falls asleep while feeding from the first breast, offer the second breast. Because newborns are often sleepy in the first few weeks of life, you may need to awaken your baby to get him or her to feed. Breastfeeding times will vary from baby to baby. However, the following rules can serve as a guide to help you ensure that your baby is properly fed:  Newborns (babies 4 weeks of age or younger) may breastfeed every 1-3 hours.  Newborns should not go longer than 3 hours during the day or 5 hours during the night without breastfeeding.  You should breastfeed your baby a minimum of 8 times in a 24-hour period until you begin to introduce solid foods to your baby at around 6   months of age. BREAST MILK PUMPING Pumping and storing breast milk allows you to ensure that your baby is exclusively fed your breast milk, even at times when you are unable to breastfeed. This is especially important if you are going back to work while you are still breastfeeding or when you are not able to be present during feedings. Your lactation consultant can give you guidelines on how long it is safe to store breast milk.  A breast pump is a machine that allows you to pump milk from your breast into a sterile bottle.  The pumped breast milk can then be stored in a refrigerator or freezer. Some breast pumps are operated by hand, while others use electricity. Ask your lactation consultant which type will work best for you. Breast pumps can be purchased, but some hospitals and breastfeeding support groups lease breast pumps on a monthly basis. A lactation consultant can teach you how to hand express breast milk, if you prefer not to use a pump.  CARING FOR YOUR BREASTS WHILE YOU BREASTFEED Nipples can become dry, cracked, and sore while breastfeeding. The following recommendations can help keep your breasts moisturized and healthy:  Avoid using soap on your nipples.   Wear a supportive bra. Although not required, special nursing bras and tank tops are designed to allow access to your breasts for breastfeeding without taking off your entire bra or top. Avoid wearing underwire-style bras or extremely tight bras.  Air dry your nipples for 3-4minutes after each feeding.   Use only cotton bra pads to absorb leaked breast milk. Leaking of breast milk between feedings is normal.   Use lanolin on your nipples after breastfeeding. Lanolin helps to maintain your skin's normal moisture barrier. If you use pure lanolin, you do not need to wash it off before feeding your baby again. Pure lanolin is not toxic to your baby. You may also hand express a few drops of breast milk and gently massage that milk into your nipples and allow the milk to air dry. In the first few weeks after giving birth, some women experience extremely full breasts (engorgement). Engorgement can make your breasts feel heavy, warm, and tender to the touch. Engorgement peaks within 3-5 days after you give birth. The following recommendations can help ease engorgement:  Completely empty your breasts while breastfeeding or pumping. You may want to start by applying warm, moist heat (in the shower or with warm water-soaked hand towels) just before feeding or  pumping. This increases circulation and helps the milk flow. If your baby does not completely empty your breasts while breastfeeding, pump any extra milk after he or she is finished.  Wear a snug bra (nursing or regular) or tank top for 1-2 days to signal your body to slightly decrease milk production.  Apply ice packs to your breasts, unless this is too uncomfortable for you.  Make sure that your baby is latched on and positioned properly while breastfeeding. If engorgement persists after 48 hours of following these recommendations, contact your health care provider or a lactation consultant. OVERALL HEALTH CARE RECOMMENDATIONS WHILE BREASTFEEDING  Eat healthy foods. Alternate between meals and snacks, eating 3 of each per day. Because what you eat affects your breast milk, some of the foods may make your baby more irritable than usual. Avoid eating these foods if you are sure that they are negatively affecting your baby.  Drink milk, fruit juice, and water to satisfy your thirst (about 10 glasses a day).   Rest   often, relax, and continue to take your prenatal vitamins to prevent fatigue, stress, and anemia.  Continue breast self-awareness checks.  Avoid chewing and smoking tobacco.  Avoid alcohol and drug use. Some medicines that may be harmful to your baby can pass through breast milk. It is important to ask your health care provider before taking any medicine, including all over-the-counter and prescription medicine as well as vitamin and herbal supplements. It is possible to become pregnant while breastfeeding. If birth control is desired, ask your health care provider about options that will be safe for your baby. SEEK MEDICAL CARE IF:   You feel like you want to stop breastfeeding or have become frustrated with breastfeeding.  You have painful breasts or nipples.  Your nipples are cracked or bleeding.  Your breasts are red, tender, or warm.  You have a swollen area on either  breast.  You have a fever or chills.  You have nausea or vomiting.  You have drainage other than breast milk from your nipples.  Your breasts do not become full before feedings by the fifth day after you give birth.  You feel sad and depressed.  Your baby is too sleepy to eat well.  Your baby is having trouble sleeping.   Your baby is wetting less than 3 diapers in a 24-hour period.  Your baby has less than 3 stools in a 24-hour period.  Your baby's skin or the white part of his or her eyes becomes yellow.   Your baby is not gaining weight by 5 days of age. SEEK IMMEDIATE MEDICAL CARE IF:   Your baby is overly tired (lethargic) and does not want to wake up and feed.  Your baby develops an unexplained fever. Document Released: 08/11/2005 Document Revised: 08/16/2013 Document Reviewed: 02/02/2013 [employer] Patient Information 2015 [employer]. This information is not intended to replace advice given to you by your health care provider. Make sure you discuss any questions you have with your health care provider.  
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
## 2015-03-22 LAB — PRENATAL PROFILE (SOLSTAS)
Antibody Screen: NEGATIVE
Basophils Absolute: 0 10*3/uL (ref 0.0–0.1)
Basophils Relative: 0 % (ref 0–1)
Eosinophils Absolute: 0.5 10*3/uL (ref 0.0–0.7)
Eosinophils Relative: 6 % — ABNORMAL HIGH (ref 0–5)
HCT: 39.2 % (ref 36.0–46.0)
HIV 1&2 Ab, 4th Generation: NONREACTIVE
Hemoglobin: 13 g/dL (ref 12.0–15.0)
Hepatitis B Surface Ag: NEGATIVE
Lymphocytes Relative: 29 % (ref 12–46)
Lymphs Abs: 2.3 10*3/uL (ref 0.7–4.0)
MCH: 31.6 pg (ref 26.0–34.0)
MCHC: 33.2 g/dL (ref 30.0–36.0)
MCV: 95.4 fL (ref 78.0–100.0)
MPV: 11 fL (ref 8.6–12.4)
Monocytes Absolute: 0.6 10*3/uL (ref 0.1–1.0)
Monocytes Relative: 7 % (ref 3–12)
Neutro Abs: 4.7 10*3/uL (ref 1.7–7.7)
Neutrophils Relative %: 58 % (ref 43–77)
Platelets: 174 10*3/uL (ref 150–400)
RBC: 4.11 MIL/uL (ref 3.87–5.11)
RDW: 13.7 % (ref 11.5–15.5)
Rh Type: POSITIVE
Rubella: 1.7 Index — ABNORMAL HIGH (ref <0.90)
WBC: 8.1 10*3/uL (ref 4.0–10.5)

## 2015-03-22 LAB — HEPATITIS PANEL, ACUTE
HCV Ab: REACTIVE — AB
Hep A IgM: NONREACTIVE
Hep B C IgM: NONREACTIVE
Hepatitis B Surface Ag: NEGATIVE

## 2015-03-23 LAB — HEPATITIS C RNA QUANTITATIVE
HCV Quantitative Log: 6.11 {Log} — ABNORMAL HIGH (ref <1.18)
HCV Quantitative: 1273980 IU/mL — ABNORMAL HIGH (ref <15)

## 2015-03-23 LAB — CYTOLOGY - PAP

## 2015-03-25 LAB — CULTURE, OB URINE: Colony Count: 30000

## 2015-03-26 ENCOUNTER — Telehealth: Payer: Self-pay | Admitting: *Deleted

## 2015-03-26 ENCOUNTER — Encounter: Payer: Self-pay | Admitting: *Deleted

## 2015-03-26 NOTE — Telephone Encounter (Signed)
Called pt to adv lab results- VM hasn't been set up so can't leave a message. Emergency contact number not in service. Will send letter to pt to call office.

## 2015-03-26 NOTE — Telephone Encounter (Signed)
Pt called back and I went over lab results. Pt expressed understanding.

## 2015-03-27 LAB — OPIATES/OPIOIDS (LC/MS-MS)
Codeine Urine: 1075 ng/mL — AB (ref <50)
Hydrocodone: NEGATIVE ng/mL (ref <50)
Hydromorphone: 427 ng/mL — AB (ref <50)
Morphine Urine: >50000 ng/mL — AB (ref <50)
Norhydrocodone, Ur: NEGATIVE ng/mL (ref <50)
Noroxycodone, Ur: NEGATIVE ng/mL (ref <50)
Oxycodone, ur: NEGATIVE ng/mL (ref <50)
Oxymorphone: NEGATIVE ng/mL (ref <50)

## 2015-03-27 LAB — COCAINE METABOLITE (GC/LC/MS), URINE: Benzoylecgonine GC/MS Conf: >75000 ng/mL — AB (ref <100)

## 2015-03-27 LAB — FENTANYL (GC/LC/MS), URINE
Fentanyl, confirm: NEGATIVE ng/mL (ref <0.5)
Norfentanyl, confirm: 23.5 ng/mL — AB (ref <0.5)

## 2015-03-28 LAB — PRESCRIPTION MONITORING PROFILE (19 PANEL)
Amphetamine/Meth: NEGATIVE ng/mL
Barbiturate Screen, Urine: NEGATIVE ng/mL
Benzodiazepine Screen, Urine: NEGATIVE ng/mL
Buprenorphine, Urine: NEGATIVE ng/mL
Cannabinoid Scrn, Ur: NEGATIVE ng/mL
Carisoprodol, Urine: NEGATIVE ng/mL
Creatinine, Urine: 237.41 mg/dL (ref >20.0)
MDMA URINE: NEGATIVE ng/mL
Meperidine, Ur: NEGATIVE ng/mL
Methadone Screen, Urine: NEGATIVE ng/mL
Methaqualone: NEGATIVE ng/mL
Nitrites, Initial: NEGATIVE ug/mL
Oxycodone Screen, Ur: NEGATIVE ng/mL
Phencyclidine, Ur: NEGATIVE ng/mL
Propoxyphene: NEGATIVE ng/mL
Tapentadol, urine: NEGATIVE ng/mL
Tramadol Scrn, Ur: NEGATIVE ng/mL
Zolpidem, Urine: NEGATIVE ng/mL
pH, Initial: 6.5 pH (ref 4.5–8.9)

## 2015-04-13 ENCOUNTER — Other Ambulatory Visit: Payer: Self-pay | Admitting: Family Medicine

## 2015-04-13 MED ORDER — FLUCONAZOLE 150 MG PO TABS: 150.0000 mg | ORAL_TABLET | Freq: Once | ORAL | Status: DC

## 2015-04-13 NOTE — Progress Notes (Signed)
Pt. On Abx for staph infection.  Has yeast infection, tried OTC monistat--needs diflucan.  Beyond 1st trimester, will send in for pt.

## 2015-04-17 ENCOUNTER — Ambulatory Visit (INDEPENDENT_AMBULATORY_CARE_PROVIDER_SITE_OTHER): Payer: Medicaid Other | Admitting: Obstetrics & Gynecology

## 2015-04-17 VITALS — BP 97/60 | HR 87 | Wt 147.0 lb

## 2015-04-17 DIAGNOSIS — B1920 Unspecified viral hepatitis C without hepatic coma: Secondary | ICD-10-CM | POA: Insufficient documentation

## 2015-04-17 DIAGNOSIS — O0991 Supervision of high risk pregnancy, unspecified, first trimester: Secondary | ICD-10-CM | POA: Diagnosis not present

## 2015-04-17 DIAGNOSIS — F1910 Other psychoactive substance abuse, uncomplicated: Secondary | ICD-10-CM | POA: Diagnosis not present

## 2015-04-17 DIAGNOSIS — O34219 Maternal care for unspecified type scar from previous cesarean delivery: Secondary | ICD-10-CM

## 2015-04-17 DIAGNOSIS — O3421 Maternal care for scar from previous cesarean delivery: Secondary | ICD-10-CM | POA: Diagnosis not present

## 2015-04-17 NOTE — Progress Notes (Signed)
Subjective:  Tiffany Salazar is a 34 y.o. SW Z6X0960 (13 and 3 yo kids) at [redacted]w[redacted]d being seen today for ongoing prenatal care.  Patient reports no complaints.   .  Vag. Bleeding: None. Movement: Absent. Denies leaking of fluid.   The following portions of the patient's history were reviewed and updated as appropriate: allergies, current medications, past family history, past medical history, past social history, past surgical history and problem list.   Objective:   Filed Vitals:   04/17/15 0951  BP: 97/60  Pulse: 87  Weight: 147 lb (66.679 kg)    Fetal Status:     Movement: Absent     General:  Alert, oriented and cooperative. Patient is in no acute distress.  Skin: Skin is warm and dry. No rash noted.   Cardiovascular: Normal heart rate noted  Respiratory: Normal respiratory effort, no problems with respiration noted  Abdomen: Soft, gravid, appropriate for gestational age. Pain/Pressure: Absent     Pelvic: Vag. Bleeding: None Vag D/C Character: Thin   Cervical exam deferred        Extremities: Normal range of motion.  Edema: None  Mental Status: Normal mood and affect. Normal behavior. Normal judgment and thought content.   Urinalysis: Urine Protein: Negative Urine Glucose: Negative  Assessment and Plan:  Pregnancy: A5W0981 at [redacted]w[redacted]d  1. Previous cesarean section complicating pregnancy, antepartum condition or complication  - repeat to be scheduled  2. Supervision of high risk pregnancy, antepartum, first trimester - refer to ID  3. Polysubstance abuse   Preterm labor symptoms and general obstetric precautions including but not limited to vaginal bleeding, contractions, leaking of fluid and fetal movement were reviewed in detail with the patient.  Please refer to After Visit Summary for other counseling recommendations.  Return in about 4 weeks (around 05/15/2015).   Allie Bossier, MD

## 2015-05-15 ENCOUNTER — Encounter: Payer: Medicaid Other | Admitting: Family Medicine

## 2015-08-31 ENCOUNTER — Encounter (HOSPITAL_COMMUNITY): Payer: Self-pay | Admitting: *Deleted

## 2015-08-31 ENCOUNTER — Emergency Department (HOSPITAL_COMMUNITY)
Admission: EM | Admit: 2015-08-31 | Discharge: 2015-08-31 | Disposition: A | Discharge status: Home / Self Care | Payer: Medicaid Other | Attending: Physician Assistant | Admitting: Physician Assistant

## 2015-08-31 DIAGNOSIS — L03116 Cellulitis of left lower limb: Secondary | ICD-10-CM | POA: Diagnosis not present

## 2015-08-31 DIAGNOSIS — Z8614 Personal history of Methicillin resistant Staphylococcus aureus infection: Secondary | ICD-10-CM | POA: Insufficient documentation

## 2015-08-31 DIAGNOSIS — Z792 Long term (current) use of antibiotics: Secondary | ICD-10-CM | POA: Diagnosis not present

## 2015-08-31 DIAGNOSIS — F17210 Nicotine dependence, cigarettes, uncomplicated: Secondary | ICD-10-CM | POA: Diagnosis not present

## 2015-08-31 DIAGNOSIS — M79605 Pain in left leg: Secondary | ICD-10-CM | POA: Diagnosis present

## 2015-08-31 MED ORDER — FLUCONAZOLE 200 MG PO TABS: 200.0000 mg | ORAL_TABLET | Freq: Once | ORAL | Status: AC

## 2015-08-31 MED ORDER — CEPHALEXIN 500 MG PO CAPS
500.0000 mg | ORAL_CAPSULE | Freq: Three times a day (TID) | ORAL | Status: DC
Start: 2015-08-31 — End: 2016-01-22

## 2015-08-31 MED ORDER — SULFAMETHOXAZOLE-TRIMETHOPRIM 800-160 MG PO TABS: 1.0000 | ORAL_TABLET | Freq: Two times a day (BID) | ORAL | Status: AC

## 2015-08-31 MED ORDER — CEPHALEXIN 250 MG PO CAPS
500.0000 mg | ORAL_CAPSULE | Freq: Once | ORAL | Status: AC
  Administered 2015-08-31: 500 mg via ORAL
  Filled 2015-08-31: qty 2

## 2015-08-31 MED ORDER — SULFAMETHOXAZOLE-TRIMETHOPRIM 800-160 MG PO TABS
1.0000 | ORAL_TABLET | Freq: Once | ORAL | Status: AC
  Administered 2015-08-31: 1 via ORAL
  Filled 2015-08-31: qty 1

## 2015-08-31 NOTE — Discharge Instructions (Signed)
Cellulitis Cellulitis is an infection of the skin and the tissue beneath it. The infected area is usually red and tender. Cellulitis occurs most often in the arms and lower legs.  CAUSES  Cellulitis is caused by bacteria that enter the skin through cracks or cuts in the skin. The most common types of bacteria that cause cellulitis are staphylococci and streptococci. SIGNS AND SYMPTOMS   Redness and warmth.  Swelling.  Tenderness or pain.  Fever. DIAGNOSIS  Your health care provider can usually determine what is wrong based on a physical exam. Blood tests may also be done. TREATMENT  Treatment usually involves taking an antibiotic medicine. HOME CARE INSTRUCTIONS   Take your antibiotic medicine as directed by your health care provider. Finish the antibiotic even if you start to feel better.  Keep the infected arm or leg elevated to reduce swelling.  Apply a warm cloth to the affected area up to 4 times per day to relieve pain.  Take medicines only as directed by your health care provider.  Keep all follow-up visits as directed by your health care provider. SEEK MEDICAL CARE IF:   You notice red streaks coming from the infected area.  Your red area gets larger or turns dark in color.  Your bone or joint underneath the infected area becomes painful after the skin has healed.  Your infection returns in the same area or another area.  You notice a swollen bump in the infected area.  You develop new symptoms.  You have a fever. SEEK IMMEDIATE MEDICAL CARE IF:   You feel very sleepy.  You develop vomiting or diarrhea.  You have a general ill feeling (malaise) with muscle aches and pains.   This information is not intended to replace advice given to you by your health care provider. Make sure you discuss any questions you have with your health care provider.   Document Released: 05/21/2005 Document Revised: 05/02/2015 Document Reviewed: 10/27/2011 Elsevier Interactive  Patient Education 2016 [employer]  Emergency Department Resource Guide 1) Find a Doctor and Pay Out of Pocket Although you won't have to find out who is covered by your insurance plan, it is a good idea to ask around and get recommendations. You will then need to call the office and see if the doctor you have chosen will accept you as a new patient and what types of options they offer for patients who are self-pay. Some doctors offer discounts or will set up payment plans for their patients who do not have insurance, but you will need to ask so you aren't surprised when you get to your appointment.  2) Contact Your Local Health Department Not all health departments have doctors that can see patients for sick visits, but many do, so it is worth a call to see if yours does. If you don't know where your local health department is, you can check in your phone book. The CDC also has a tool to help you locate your state's health department, and many state websites also have listings of all of their local health departments.  3) Find a Walk-in Clinic If your illness is not likely to be very severe or complicated, you may want to try a walk in clinic. These are popping up all over the country in pharmacies, drugstores, and shopping centers. They're usually staffed by nurse practitioners or physician assistants that have been trained to treat common illnesses and complaints. They're usually fairly quick and inexpensive. However, if you have serious  medical issues or chronic medical problems, these are probably not your best option.  No Primary Care Doctor: - Call Health Connect at  [phone] - they can help you locate a primary care doctor that  accepts your insurance, provides certain services, etc. - Physician Referral Service- [phone]  Chronic Pain Problems: Organization         Address  Phone   Notes  [facility]  [phone]) [phone] Patients need to be referred by their primary  care doctor.   Medication Assistance: Organization         Address  Phone   Notes  [facility] Medication [facility] [address] [place], [place] [address] [phone]) [phone] --Must be a resident of [place] -- Must have NO insurance coverage whatsoever (no Medicaid/ Medicare, etc.) -- The pt. MUST have a primary care doctor that directs their care regularly and follows them in the community   MedAssist  [phone]   [employer]  [phone]    Agencies that provide inexpensive medical care: Organization         Address  Phone   Notes  [facility]  [phone]   [facility]    [phone]   [facility] [address] [place], [place] [address] [phone]   [facility] [address], [place] [phone]) [phone]   Planned Parenthood    [phone]   [facility]    [phone]   [facility] and [facility]  [address], [place] Phone:  [phone]) [phone], Fax:  [phone]) [phone] Hours of Operation:  9 am - 6 pm, M-F.  Also accepts Medicaid/Medicare and self-pay.  [facility]  [address], [place] Phone: [phone](336) [phone], Fax: [phone]) [phone]. Hours of Operation:  8:30 am - 5:30 pm, M-F.  Also accepts Medicaid and self-pay.  [facility] [place] [address], [place] Phone: [phone]) [phone]   [facility] [address], [place], [place] [phone], Ext. [phone] Mondays & Thursdays: 7-9 AM.  First 15 patients are seen on a first come, first serve basis.    Medicaid-accepting [facility] Providers:  Organization         Address  Phone   Notes  [facility] [address], [place] [phone]) [phone] Also accepts self-pay patients.  [facility] [address], [place]  [phone]) [phone]   [facility] [address], [place] [phone]   [facility] [address] [place] [address], [place] [phone]) [phone]   Renaye RakersVeita Bland [address], [place]   [phone] Only accepts [place] Access [place] patients after they have their name applied to their card.   Self-Pay (no insurance) in [place] [facility]:  Organization         Address  Phone   Notes  Sickle Cell Patients, [facility] [address], [place] [phone]   [facility] [address], [place] [phone]   [facility] [place]  [address], [place] [phone]) [phone]   [facility]/Dr. Osei-Bonsu  [address] [place] [address], [place] or [id number], [place] [phone]) [phone] Phone number for both [place] and [place] locations is  the same.  [facility]gent [facility] and [facility] [address], [place] [phone]) [phone]   [facility] [place] [address] [place] [address], [place] or [address] [phone]   [facility] [address], [place] [phone](336) [phone], phone; [phone](336) [phone], fax Sees patients 1st and 3rd Saturday of every month.  Must not qualify for public or private insurance (i.e. Medicaid, Medicare, [place] Health Choice, Veterans' Benefits)  Household income should be no more than 200% of the poverty level The clinic cannot treat you if you are pregnant or think you are pregnant  Sexually transmitted diseases are not treated at the clinic.    Dental Care: Organization         Address  Phone  Notes  [facility] Department of [facility] [address], [place] [phone] Accepts children up to age 421 who are enrolled in [place]icaid or [place] Health Choice; pregnant women with a Medicaid card; and children who have applied for Medicaid or [place] Health Choice, but were declined, whose parents can pay a reduced fee at  time of service.  [facility] of [facility] [place]  [address], [place] [phone]) [phone] Accepts children up to age 33 who are enrolled in [place] or [place] Health Choice; pregnant women with a Medicaid card; and children who have applied for Medicaid or [place] Health Choice, but were declined, whose parents can pay a reduced fee at time of service.  [facility]  [address], [place] [phone]) [phone] Patients are seen by appointment only. Walk-ins are not accepted. [facility] will see patients 33 years of age and older. Monday - Tuesday (8am-5pm) Most Wednesdays (8:30-5pm) $30 per visit, cash only  [facility]  [address], [place] [facility] [phone] Patients are seen by appointment only. Walk-ins are not accepted. [facility] will see patients 33 years of age and older. One Wednesday Evening (Monthly: Volunteer Based).  $30 per visit, cash only  [facility]  [phone]) [phone] for adults; Children under age 284, call Graduate Pediatric Dentistry at [phone]) [phone]. Children aged 764-14, please call [phone] to request a pediatric application.  Dental services are provided in all areas of dental care including fillings, crowns and bridges, complete and partial dentures, implants, gum treatment, root canals, and extractions. Preventive care is also provided. Treatment is provided to both adults and children. Patients are selected via a lottery and there is often a waiting list.   [facility] [address], [place]  [phone] [email or web address]   [facility] [address], [place], [place] [phone], Ext. [phone] Second and Fourth Thursday of each month, opens at 6:30 AM; Clinic ends at 9 AM.  Patients are seen on a first-come first-served basis, and a limited number are seen during each clinic.   [facility]  [address], [place]  [place], [place] [phone]) [phone]   Eligibility Requirements You must have lived in [place], [place], or [place] counties for at least the last three months.   You cannot be eligible for state or federal sponsored National Cityhealthcare insurance, including CIGNAVeterans Administration, [place]icaid, or [employer] EntertainmentMedicare.   You generally cannot be eligible for healthcare insurance through your employer.    How to apply: Eligibility screenings are held every Tuesday and Wednesday afternoon from 1:00 pm until 4:00 pm. You do not need an appointment  for the interview!  [facility] [address] [place] [address], [place], [place]   [place]  [place] Department  [place]  [phone]    Behavioral Health Resources in the Community: Intensive Outpatient Programs Organization         Address  Phone  Notes  [place]. [address] [place], [place] [phone]   [facility] [address] [place], [place]   ADS: Alcohol & Drug S[facility]cs [address] [place], [place]   [place] [address]  [place], [place] or [phone]   Substance Abuse Resources Organization         Address  Phone  Notes  Alcohol and Drug Services  [phone]   [place]  [phone]   [facility] [place]   Chinita Pester  [phone]   Residential & Outpatient Substance Abuse Program  [phone]   Psychological Services Organization         Address  Phone  Notes  [facility] [place]  [place]  [phone]   [place] [address], [place] or [phone]    Mobile Crisis Teams Organization         Address  Phone  Notes  [facility], Mobile Crisis Care Unit  [phone]   Assertive Psychotherapeutic  Services  [address] [place], [place]   [place] Levels [address] [place] [phone]    Self-Help/Support Groups Organization         Address  Phone             Notes  [place]. of [place] - variety of support groups  [place] Call for more information  Narcotics Anonymous (NA), Caring [facility] [address], [employer] [place]  2 meetings at this location   Special educational needs teacher         Address  Phone  Notes  ASAP Residential Treatment [place],    [place]  [phone]   [facility]  [address] [place] [phone], [place], [place]   [place], [place] [phone] Admissions: 8am-3pm M-F  Incentives Substance [place] [place] [address],    [place], [place] [phone]   [facility] [address] [place], [place], [place]   [facility] [address]  [place], [place]   Insight Programs - Intensive Outpatient [address], [place], [place]   [facility] Albert.,  [place], [place] [phone] or [phone]   [facility]idential Treatment Services (RTS) [address] [place], [place] Accepts Medicaid  [facility] [address]  [place] [phone] Substance Abuse/Addiction Treatment   [facility] Organization         Address  Phone  Notes  [facility]  [phone]   Domenic Schwab, PhD [address] [place]lis [place], [place]   ([phone] or [phone]   [place], [place] [phone]   [place] [address] [place], [place] [phone] Insurance/Medicaid/sponsorship through [employer] and Families [address] [id number]  [place], [place] [phone] Therapy/tele-psych/case  °[facility] [address]  ° [place], [place] [phone]    °Dr. Arfeen  [phone]   °[facility] [place]  [employer] [place] [facility] 1) [address], [place] °2) [address], [place] °3)  [address], [place] [phone] °([phone] ° °([phone]) [phone]   °[place] Child Abuse Hotline [phone]) [phone] or ([phone] (After Hours)    ° ° ° °

## 2015-08-31 NOTE — ED Notes (Signed)
Pt reports right lower leg swelling and pain, reports redness as spread. Warm to touch.

## 2015-08-31 NOTE — ED Provider Notes (Signed)
CSN: [id number]     Arrival date & time 08/31/15  0710 History   First MD Initiated Contact with Patient 08/31/15 [phone]     Chief Complaint  Patient presents with  . Leg Pain     (Consider location/radiation/quality/duration/timing/severity/associated sxs/prior Treatment) HPI   Patient is a 35 year old IV drug user polysubstance abuse presenting today with cellulitis of her left lower extremity. Patient reports that she used some type of razor from her friend. She reports that friend has infection and abscesses all over her body. Now she has mild erythema to her left lower extremity.  She reports that she used to have a bulbous area that she  Thinks was an abscess that is since resolved and is now just warm to touch with erythema.  Past Medical History  Diagnosis Date  . Polysubstance abuse   . IV drug user   . Abscess   . History of MRSA infection    Past Surgical History  Procedure Laterality Date  . Cesarean section  x 2   History reviewed. No pertinent family history. Social History  Substance Use Topics  . Smoking status: Current Every Day Smoker -- 1.00 packs/day    Types: Cigarettes  . Smokeless tobacco: None  . Alcohol Use: No   OB History    Gravida Para Term Preterm AB TAB SAB Ectopic Multiple Living   7 2 2  4 3 1   2      Review of Systems  Constitutional: Negative for fever, activity change and fatigue.  Eyes: Negative for discharge.  Cardiovascular: Negative for chest pain.  Gastrointestinal: Negative for abdominal distention.  Genitourinary: Negative for difficulty urinating.  Skin: Negative for rash.  Allergic/Immunologic: Negative for immunocompromised state.      Allergies  Review of patient's allergies indicates no known allergies.  Home Medications   Prior to Admission medications   Medication Sig Start Date End Date Taking? Authorizing Provider  clindamycin (CLEOCIN) 150 MG capsule Take 2 capsules (300 mg total) by mouth 3 (three) times  daily. May dispense as 150mg  capsules 03/21/15   Reva Boresanya S Pratt, MD  clindamycin (CLINDAGEL) 1 % gel Apply topically 2 (two) times daily. 03/21/15   Reva Boresanya S Pratt, MD  fluconazole (DIFLUCAN) 150 MG tablet Take 1 tablet (150 mg total) by mouth once. Repeat in 24 hours if needed 04/13/15   Reva Boresanya S Pratt, MD  prenatal vitamin w/FE, FA (NATACHEW) 29-1 MG CHEW chewable tablet Chew 1 tablet by mouth daily at 12 noon.    Historical Provider, MD   BP 117/68 mmHg  Pulse 91  Temp(Src) 98.8 F (37.1 C) (Oral)  Resp 18  SpO2 98%  LMP 08/31/2015  Breastfeeding? Unknown Physical Exam  Constitutional: She is oriented to person, place, and time. She appears well-developed and well-nourished.  HENT:  Head: Normocephalic and atraumatic.  Eyes: Conjunctivae are normal. Right eye exhibits no discharge.  Neck: Neck supple.  Cardiovascular: Normal rate, regular rhythm and normal heart sounds.   No murmur heard. Musculoskeletal: Normal range of motion. She exhibits no edema.  Patient has erythema to left lower extremity. It is mild. Not well circumscribed. No palpable abscess.  Neurological: She is oriented to person, place, and time. No cranial nerve deficit.  Skin: Skin is warm and dry. No rash noted. She is not diaphoretic.  Nursing note and vitals reviewed.   ED Course  Procedures (including critical care time) Labs Review Labs Reviewed - No data to display  Imaging Review No results found.  I have personally reviewed and evaluated these images and lab results as part of my medical decision-making.   EKG Interpretation None      MDM   Final diagnoses:  None    Patient is a 35 year old female presenting with infection to left lower extremity. Patient has mild erythema to left lower extremity. It is not well circumscribed. No palpable abscess. Ultrasound was placed and no abscess was noted. We will give her Bactrim and Keflex and have her follow-up with primary care.  ULTRASOUND LIMITED SOFT  TISSUE/ MUSCULOSKELETAL:  Indication: Looking for abscesss Linear probe used to evaluate area of interest in two planes. Findings:  No abscess, + cobblestoning Performed by: Dr Corlis Leak Images saved electronically     Ulices Maack Randall An, MD 08/31/15 [phone]

## 2015-12-25 ENCOUNTER — Ambulatory Visit: Payer: Medicaid Other | Admitting: Obstetrics & Gynecology

## 2016-01-16 ENCOUNTER — Encounter: Payer: Self-pay | Admitting: Family Medicine

## 2016-01-16 ENCOUNTER — Ambulatory Visit (INDEPENDENT_AMBULATORY_CARE_PROVIDER_SITE_OTHER): Payer: Medicaid Other | Admitting: Family Medicine

## 2016-01-16 VITALS — BP 112/71 | HR 104 | Temp 98.1°F | Ht 68.0 in | Wt 140.0 lb

## 2016-01-16 DIAGNOSIS — F1910 Other psychoactive substance abuse, uncomplicated: Secondary | ICD-10-CM | POA: Diagnosis not present

## 2016-01-16 DIAGNOSIS — A4902 Methicillin resistant Staphylococcus aureus infection, unspecified site: Secondary | ICD-10-CM | POA: Diagnosis not present

## 2016-01-16 DIAGNOSIS — Z7689 Persons encountering health services in other specified circumstances: Secondary | ICD-10-CM

## 2016-01-16 DIAGNOSIS — B1920 Unspecified viral hepatitis C without hepatic coma: Secondary | ICD-10-CM | POA: Diagnosis present

## 2016-01-16 DIAGNOSIS — Z7189 Other specified counseling: Secondary | ICD-10-CM | POA: Diagnosis not present

## 2016-01-16 MED ORDER — MUPIROCIN 2 % EX OINT: 1.0000 "application " | TOPICAL_OINTMENT | Freq: Two times a day (BID) | CUTANEOUS | Status: DC
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
## 2016-01-17 ENCOUNTER — Encounter: Payer: Self-pay | Admitting: Family Medicine

## 2016-01-17 DIAGNOSIS — Z7689 Persons encountering health services in other specified circumstances: Secondary | ICD-10-CM | POA: Insufficient documentation

## 2016-01-17 NOTE — Assessment & Plan Note (Addendum)
Hx of cocaine and opiate abuse. Currently high on Heroin per patient report. She missed her last 2 Methadone doses. Currently on Methadone 40 mg daily per patient report. [facility] She states she has good support at home with her girlfriend. Discussed importance of following up with [facility] and not missing Methadone doses as this causes her to go out and use Heroin.

## 2016-01-17 NOTE — Assessment & Plan Note (Addendum)
Established care today. Uncertain of the accuracy of the review of systems as compared to baseline as patient admits she is high on heroin today. Additionally she is stating she has "pain all over", would like to wait to obtain blood work at another visit. Would like to follow up with patient as soon as possible in the future when she is not high to obtain a more accurate history.

## 2016-01-17 NOTE — Assessment & Plan Note (Addendum)
Hx of MRSA infections on face. No active lesions seen on face today. Will give prescription for Mupirocin ointment to use when she has any active lesions.

## 2016-01-17 NOTE — Assessment & Plan Note (Signed)
Hep C antibody positive on July 2016 labs with HCV quantitative [id number]. Would like to receive treatment. Will refer to Infectious Disease specialist.

## 2016-01-22 ENCOUNTER — Ambulatory Visit (INDEPENDENT_AMBULATORY_CARE_PROVIDER_SITE_OTHER): Payer: Medicaid Other | Admitting: Family Medicine

## 2016-01-22 ENCOUNTER — Encounter: Payer: Self-pay | Admitting: Family Medicine

## 2016-01-22 VITALS — BP 105/63 | HR 90 | Temp 98.0°F | Wt 158.0 lb

## 2016-01-22 DIAGNOSIS — M25421 Effusion, right elbow: Secondary | ICD-10-CM | POA: Diagnosis not present

## 2016-01-22 DIAGNOSIS — F1910 Other psychoactive substance abuse, uncomplicated: Secondary | ICD-10-CM | POA: Diagnosis not present

## 2016-01-22 DIAGNOSIS — L03113 Cellulitis of right upper limb: Secondary | ICD-10-CM

## 2016-01-22 DIAGNOSIS — M25521 Pain in right elbow: Secondary | ICD-10-CM

## 2016-01-22 MED ORDER — SULFAMETHOXAZOLE-TRIMETHOPRIM 800-160 MG PO TABS: 2.0000 | ORAL_TABLET | Freq: Two times a day (BID) | ORAL | Status: DC

## 2016-01-22 MED ORDER — CEPHALEXIN 500 MG PO CAPS: 500.0000 mg | ORAL_CAPSULE | Freq: Three times a day (TID) | ORAL | Status: DC

## 2016-01-22 NOTE — Progress Notes (Signed)
Subjective:    Patient ID: Tiffany Salazar, female    DOB: 11/16/1980, 35 y.o.   MRN: [id number]  Tiffany Salazar is a 35 y.o. female presenting on 01/22/2016 for Elbow Pain   Patient presents for a same day appointment.   HPI  RIGHT ELBOW CELLULITIS: - Reports symptoms started 4 days ago on Friday night she was unsure if "hit elbow on something" or if "insect bite", then progressive worsening over the weekend with pain and swelling, then with redness started over past 1-2 days. History of multiple MRSA abscesses on Right forearm back in Nov/Dec 2015 treated in ED with I&D and antibiotics with IV Clinda and then PO clinda (states clindamycin was not effective for her in past). Has had several cellulitis / abscess infections since then, but most recently 08/2015 treated for Left LE cellulitis with Bactrim / Keflex (seemed to be most effective antibiotic combination) - Currently on methadone, some smoking marijuana, but denies any IV drug use, past history using heroine - History of MRSA, recurrent abscess/cellulitis upper ext and lower ext - Admits subjective fever/sweaty without recorded fever - Denies any nausea, vomiting, other joint pain or redness, drainage of pus/bleeding   Social History  Substance Use Topics  . Smoking status: Current Every Day Smoker -- 1.00 packs/day    Types: Cigarettes  . Smokeless tobacco: None  . Alcohol Use: No    Review of Systems Per HPI unless specifically indicated above     Objective:    BP 105/63 mmHg  Pulse 90  Temp(Src) 98 F (36.7 C) (Oral)  Wt 158 lb (71.668 kg)  LMP 12/30/2015 (Approximate)  Wt Readings from Last 3 Encounters:  01/22/16 158 lb (71.668 kg)  01/16/16 140 lb (63.504 kg)  04/17/15 147 lb (66.679 kg)    Physical Exam  Constitutional: She is oriented to person, place, and time. She appears well-developed and well-nourished. No distress.  Well-appearing, comfortable, cooperative  HENT:  Mouth/Throat: Oropharynx is clear  and moist.  Cardiovascular: Normal rate and intact distal pulses.   Musculoskeletal: She exhibits no edema.  Right elbow - Inspection with erythema (see skin below), full active ROM elbow flex/ext not limited, no palpable joint effusion. No obvious joint deformity. No pain over olecranon or bony process.  Neurological: She is alert and oriented to person, place, and time.  Skin: Skin is warm and dry. She is not diaphoretic. There is erythema (Right dorsal elbow extending superior and inferior, length of about 10 cm as pictured).  No palpable fluctuance or induration consistent with abscess. Mild warmth, minimal tenderness. No ulceration. No drainage. Several darker hyperpigmented small scars across right upper extremity and forearm from prior abscess  Nursing note and vitals reviewed.    Right Elbow dorsal surface         Assessment & Plan:   Problem List Items Addressed This Visit    Polysubstance abuse   Pain and swelling of right elbow   Cellulitis of right upper extremity - Primary    New onset Right dorsal elbow cellulitis without evidence of purulence or abscess, spans elbow joint and extending erythema worsening x 4 days. R-elbow active ROM fully intact and no evidence joint effusion or abscess. Unlikely R-elbow septic arthritis, but remains on differential if acute worsening. - Mild systemic subjective symptoms without recorded fever or n/v, tolerating PO. Significant history with polysubstance abuse and recurrent MRSA skin infections / recurrent abscesses upper and lower ext. Last course antibiotics 08/2015 with relief bactrim/keflex, poor  results on clinda by report.  Plan: 1. Start antibiotics Bactrim-DS 2 tabs BID x 10 days (MRSA coverage) and add keflex 500 TID x 10 days (given improvement in past, but unlikely to add additional coverage) 2. Return precautions given if worsening 3. RTC 2 days to re-check R-arm cellulitis if responding to antibiotics, consider alternative  antibiotic with doxy vs may need IV antibiotics if significant worsening      Relevant Medications   cephALEXin (KEFLEX) 500 MG capsule   sulfamethoxazole-trimethoprim (BACTRIM DS,SEPTRA DS) 800-160 MG tablet      Meds ordered this encounter  Medications  . cephALEXin (KEFLEX) 500 MG capsule    Sig: Take 1 capsule (500 mg total) by mouth 3 (three) times daily. For 10 days    Dispense:  30 capsule    Refill:  0  . sulfamethoxazole-trimethoprim (BACTRIM DS,SEPTRA DS) 800-160 MG tablet    Sig: Take 2 tablets by mouth 2 (two) times daily. For 10 days    Dispense:  40 tablet    Refill:  0      Follow up plan: Return in about 2 days (around 01/24/2016) for Right elbow cellulitis.  Saralyn Pilar, DO [facility], PGY-3

## 2016-01-22 NOTE — Assessment & Plan Note (Signed)
New onset Right dorsal elbow cellulitis without evidence of purulence or abscess, spans elbow joint and extending erythema worsening x 4 days. R-elbow active ROM fully intact and no evidence joint effusion or abscess. Unlikely R-elbow septic arthritis, but remains on differential if acute worsening. - Mild systemic subjective symptoms without recorded fever or n/v, tolerating PO. Significant history with polysubstance abuse and recurrent MRSA skin infections / recurrent abscesses upper and lower ext. Last course antibiotics 08/2015 with relief bactrim/keflex, poor results on clinda by report.  Plan: 1. Start antibiotics Bactrim-DS 2 tabs BID x 10 days (MRSA coverage) and add keflex 500 TID x 10 days (given improvement in past, but unlikely to add additional coverage) 2. Return precautions given if worsening 3. RTC 2 days to re-check R-arm cellulitis if responding to antibiotics, consider alternative antibiotic with doxy vs may need IV antibiotics if significant worsening

## 2016-01-22 NOTE — Patient Instructions (Signed)
Thank you for coming in to clinic today.  1. You have early developing Cellulitis - Start with antibiotics Bactrim-double str 2 tablets twice a day for 10 days (this is best MRSA coverage for you) and Keflex 500mg  3 times a day for 10 days - May use heating pad as needed. Do not see any area that can be drained today - May take ibuprofen / tylenol as needed  If develop persistent fevers/chills,sweats, worsening redness/pain, or abscess on antibiotics then return sooner or go to ED, otherwise if unable to tolerate antibiotics with nausea / vomiting or other reasons return sooner or go to ED  Please schedule a follow-up appointment with Dr Jonathon JordanGambino within next 2-3 days to re-check Right Elbow Cellulitis to see if improving or not  If you have any other questions or concerns, please feel free to call the clinic to contact me. You may also schedule an earlier appointment if necessary.  However, if your symptoms get significantly worse, please go to the Emergency Department to seek immediate medical attention.  Saralyn PilarAlexander Karamalegos, DO [facility]

## 2016-03-12 ENCOUNTER — Other Ambulatory Visit (HOSPITAL_COMMUNITY): Payer: Self-pay | Admitting: Nurse Practitioner

## 2016-03-12 DIAGNOSIS — F1129 Opioid dependence with unspecified opioid-induced disorder: Secondary | ICD-10-CM

## 2016-03-17 ENCOUNTER — Encounter (HOSPITAL_COMMUNITY): Payer: Self-pay | Admitting: Radiology

## 2016-03-17 ENCOUNTER — Ambulatory Visit (HOSPITAL_COMMUNITY)
Admission: RE | Admit: 2016-03-17 | Discharge: 2016-03-17 | Disposition: A | Discharge status: Home / Self Care | Payer: Medicaid Other | Source: Ambulatory Visit | Attending: Nurse Practitioner | Admitting: Nurse Practitioner

## 2016-03-17 DIAGNOSIS — O3680X0 Pregnancy with inconclusive fetal viability, not applicable or unspecified: Secondary | ICD-10-CM | POA: Diagnosis not present

## 2016-03-17 DIAGNOSIS — O99320 Drug use complicating pregnancy, unspecified trimester: Secondary | ICD-10-CM | POA: Insufficient documentation

## 2016-03-17 DIAGNOSIS — F1129 Opioid dependence with unspecified opioid-induced disorder: Secondary | ICD-10-CM | POA: Diagnosis present

## 2016-03-20 ENCOUNTER — Inpatient Hospital Stay (HOSPITAL_COMMUNITY): Payer: Medicaid Other

## 2016-03-20 ENCOUNTER — Inpatient Hospital Stay (HOSPITAL_COMMUNITY)
Admission: AD | Admit: 2016-03-20 | Discharge: 2016-03-20 | Disposition: A | Discharge status: Home / Self Care | Payer: Medicaid Other | Source: Ambulatory Visit | Attending: Obstetrics and Gynecology | Admitting: Obstetrics and Gynecology

## 2016-03-20 ENCOUNTER — Encounter (HOSPITAL_COMMUNITY): Payer: Self-pay | Admitting: *Deleted

## 2016-03-20 DIAGNOSIS — Z3A01 Less than 8 weeks gestation of pregnancy: Secondary | ICD-10-CM

## 2016-03-20 DIAGNOSIS — Z3A10 10 weeks gestation of pregnancy: Secondary | ICD-10-CM | POA: Insufficient documentation

## 2016-03-20 DIAGNOSIS — A5901 Trichomonal vulvovaginitis: Secondary | ICD-10-CM | POA: Diagnosis not present

## 2016-03-20 DIAGNOSIS — B1920 Unspecified viral hepatitis C without hepatic coma: Secondary | ICD-10-CM | POA: Insufficient documentation

## 2016-03-20 DIAGNOSIS — O209 Hemorrhage in early pregnancy, unspecified: Secondary | ICD-10-CM

## 2016-03-20 DIAGNOSIS — F17210 Nicotine dependence, cigarettes, uncomplicated: Secondary | ICD-10-CM | POA: Diagnosis not present

## 2016-03-20 DIAGNOSIS — O4691 Antepartum hemorrhage, unspecified, first trimester: Secondary | ICD-10-CM | POA: Diagnosis not present

## 2016-03-20 DIAGNOSIS — O98411 Viral hepatitis complicating pregnancy, first trimester: Secondary | ICD-10-CM | POA: Insufficient documentation

## 2016-03-20 DIAGNOSIS — A599 Trichomoniasis, unspecified: Secondary | ICD-10-CM | POA: Diagnosis not present

## 2016-03-20 DIAGNOSIS — O99331 Smoking (tobacco) complicating pregnancy, first trimester: Secondary | ICD-10-CM | POA: Diagnosis not present

## 2016-03-20 DIAGNOSIS — O98311 Other infections with a predominantly sexual mode of transmission complicating pregnancy, first trimester: Secondary | ICD-10-CM | POA: Diagnosis not present

## 2016-03-20 DIAGNOSIS — O99321 Drug use complicating pregnancy, first trimester: Secondary | ICD-10-CM

## 2016-03-20 DIAGNOSIS — Z3491 Encounter for supervision of normal pregnancy, unspecified, first trimester: Secondary | ICD-10-CM

## 2016-03-20 LAB — WET PREP, GENITAL
Sperm: NONE SEEN
Yeast Wet Prep HPF POC: NONE SEEN

## 2016-03-20 LAB — URINALYSIS, ROUTINE W REFLEX MICROSCOPIC
Bilirubin Urine: NEGATIVE
Glucose, UA: NEGATIVE mg/dL
Ketones, ur: NEGATIVE mg/dL
Nitrite: NEGATIVE
Protein, ur: NEGATIVE mg/dL
Specific Gravity, Urine: 1.025 (ref 1.005–1.030)
pH: 6 (ref 5.0–8.0)

## 2016-03-20 LAB — RAPID URINE DRUG SCREEN, HOSP PERFORMED
Amphetamines: NOT DETECTED
Barbiturates: NOT DETECTED
Benzodiazepines: NOT DETECTED
Cocaine: POSITIVE — AB
Opiates: POSITIVE — AB
Tetrahydrocannabinol: POSITIVE — AB

## 2016-03-20 LAB — POCT PREGNANCY, URINE: Preg Test, Ur: POSITIVE — AB

## 2016-03-20 LAB — CBC
HCT: 35.5 % — ABNORMAL LOW (ref 36.0–46.0)
Hemoglobin: 11.7 g/dL — ABNORMAL LOW (ref 12.0–15.0)
MCH: 31.4 pg (ref 26.0–34.0)
MCHC: 33 g/dL (ref 30.0–36.0)
MCV: 95.2 fL (ref 78.0–100.0)
Platelets: 180 10*3/uL (ref 150–400)
RBC: 3.73 MIL/uL — ABNORMAL LOW (ref 3.87–5.11)
RDW: 14.3 % (ref 11.5–15.5)
WBC: 6.8 10*3/uL (ref 4.0–10.5)

## 2016-03-20 LAB — URINE MICROSCOPIC-ADD ON

## 2016-03-20 LAB — HCG, QUANTITATIVE, PREGNANCY: hCG, Beta Chain, Quant, S: 8321 m[IU]/mL — ABNORMAL HIGH (ref <5)

## 2016-03-20 MED ORDER — METRONIDAZOLE 500 MG PO TABS
2000.0000 mg | ORAL_TABLET | Freq: Once | ORAL | Status: AC
  Administered 2016-03-20: 2000 mg via ORAL
  Filled 2016-03-20: qty 4

## 2016-03-20 NOTE — MAU Note (Signed)
Pt states she is [redacted]w[redacted]d pregnant, started spotting yesterday, denies pain.  States ankles & legs are swollen, having back pain.  Pt falling asleep in triage.

## 2016-03-20 NOTE — MAU Provider Note (Signed)
History     CSN: [id number]  Arrival date and time: 03/20/16 1104  Provider at beside at 1226    Chief Complaint  Patient presents with  . Vaginal Bleeding  . Back Pain   HPI  Patient is 35 y.o. [id number] @[redacted]w[redacted]d  that presents to the MAU with vaginal bleeding in pregnancy. She is a poor historian that is xxtremely somnolent and keeps nodding off mid-sentence or not responding to questions  during interview. In the last 24-36 hours, the patient has spotted bright red to dark red blood on three panty liners. She never soaked through or filled a panty liner. Denies passing any clots, cramping, or pelvic pain. Denies hematuria, dysuria, increased frequency, vaginal pain, or discharge.  LMP: 6/18 Methadone @ [facility]  Past history of chlamydia and gonorrhea but nothing recent.   Past Medical History:  Diagnosis Date  . Abscess   . Hepatitis C   . History of MRSA infection   . IV drug user   . Polysubstance abuse     Past Surgical History:  Procedure Laterality Date  . CESAREAN SECTION  x 2    Family History  Problem Relation Age of Onset  . Cancer Mother     breast  . Thyroid disease Mother   . Alcohol abuse Father     Social History  Substance Use Topics  . Smoking status: Current Every Day Smoker    Packs/day: 1.00    Types: Cigarettes  . Smokeless tobacco: Not on file  . Alcohol use No     Comment: Pt stated she has started goint ot rehab clinic    Allergies: No Known Allergies  Prescriptions Prior to Admission  Medication Sig Dispense Refill Last Dose  . prenatal vitamin w/FE, FA (NATACHEW) 29-1 MG CHEW chewable tablet Chew 1 tablet by mouth daily at 12 noon. Reported on 01/17/2016   03/20/2016 at Unknown time  . cephALEXin (KEFLEX) 500 MG capsule Take 1 capsule (500 mg total) by mouth 3 (three) times daily. For 10 days 30 capsule 0   . clindamycin (CLINDAGEL) 1 % gel Apply topically 2 (two) times daily. (Patient not taking: Reported on 01/17/2016) 30 g 0 Not Taking   . mupirocin ointment (BACTROBAN) 2 % Place 1 application into the nose 2 (two) times daily. 22 g 0   . sulfamethoxazole-trimethoprim (BACTRIM DS,SEPTRA DS) 800-160 MG tablet Take 2 tablets by mouth 2 (two) times daily. For 10 days 40 tablet 0     Review of Systems  Constitutional: Negative.   HENT: Negative.   Respiratory: Negative.   Cardiovascular: Negative.   Gastrointestinal: Negative.   Genitourinary: Negative for dysuria, frequency, hematuria and urgency.  Skin: Negative.    All other systems negative.   Physical Exam   Blood pressure 106/67, pulse 71, temperature 97.7 F (36.5 C), temperature source Oral, resp. rate 16, height 5\' 7"  (1.702 m), weight 71.7 kg (158 lb), last menstrual period 01/10/2016, unknown if currently breastfeeding.  Physical Exam  Nursing note and vitals reviewed. Constitutional: She is oriented to person, place, and time. She appears well-developed. She appears lethargic.  Looks older than stated age and asleep on the bed when entered the room.   Cardiovascular: Normal rate, regular rhythm and normal heart sounds.   Respiratory: Effort normal and breath sounds normal.  GI: Soft. Normal appearance and bowel sounds are normal. There is no tenderness.  Genitourinary: Uterus normal. There is no rash, tenderness or lesion on the right labia. There is no rash,  tenderness or lesion on the left labia. Cervix exhibits no motion tenderness. Right adnexum displays no mass and no tenderness. Left adnexum displays no mass and no tenderness. There is bleeding in the vagina.  Genitourinary Comments: Blood noted in vaginal vault, one Fox swab used to help visualize; Cervix was not dilated, os visualized and bleeding appeared to come from os   Neurological: She is oriented to person, place, and time. She appears lethargic.  Appears under the influence   Psychiatric: Her speech is slurred. She is slowed. She is noncommunicative.    MAU Course  Procedures  Results  for orders placed or performed during the hospital encounter of 03/20/16 (from the past 24 hour(s))  Urinalysis, Routine w reflex microscopic (not at [facility])     Status: Abnormal   Collection Time: 03/20/16 11:30 AM  Result Value Ref Range   Color, Urine YELLOW YELLOW   APPearance CLEAR CLEAR   Specific Gravity, Urine 1.025 1.005 - 1.030   pH 6.0 5.0 - 8.0   Glucose, UA NEGATIVE NEGATIVE mg/dL   Hgb urine dipstick MODERATE (A) NEGATIVE   Bilirubin Urine NEGATIVE NEGATIVE   Ketones, ur NEGATIVE NEGATIVE mg/dL   Protein, ur NEGATIVE NEGATIVE mg/dL   Nitrite NEGATIVE NEGATIVE   Leukocytes, UA TRACE (A) NEGATIVE  Urine rapid drug screen (hosp performed)     Status: Abnormal   Collection Time: 03/20/16 11:30 AM  Result Value Ref Range   Opiates POSITIVE (A) NONE DETECTED   Cocaine POSITIVE (A) NONE DETECTED   Benzodiazepines NONE DETECTED NONE DETECTED   Amphetamines NONE DETECTED NONE DETECTED   Tetrahydrocannabinol POSITIVE (A) NONE DETECTED   Barbiturates NONE DETECTED NONE DETECTED  Urine microscopic-add on     Status: Abnormal   Collection Time: 03/20/16 11:30 AM  Result Value Ref Range   Squamous Epithelial / LPF 0-5 (A) NONE SEEN   WBC, UA 6-30 0 - 5 WBC/hpf   RBC / HPF 0-5 0 - 5 RBC/hpf   Bacteria, UA FEW (A) NONE SEEN  Pregnancy, urine POC     Status: Abnormal   Collection Time: 03/20/16 11:36 AM  Result Value Ref Range   Preg Test, Ur POSITIVE (A) NEGATIVE  CBC     Status: Abnormal   Collection Time: 03/20/16 12:07 PM  Result Value Ref Range   WBC 6.8 4.0 - 10.5 K/uL   RBC 3.73 (L) 3.87 - 5.11 MIL/uL   Hemoglobin 11.7 (L) 12.0 - 15.0 g/dL   HCT 16.1 (L) 09.6 - 04.5 %   MCV 95.2 78.0 - 100.0 fL   MCH 31.4 26.0 - 34.0 pg   MCHC 33.0 30.0 - 36.0 g/dL   RDW 40.9 81.1 - 91.4 %   Platelets 180 150 - 400 K/uL  hCG, quantitative, pregnancy     Status: Abnormal   Collection Time: 03/20/16 12:07 PM  Result Value Ref Range   hCG, Beta Chain, Quant, S 8,321 (H) <5 mIU/mL   Wet prep, genital     Status: Abnormal   Collection Time: 03/20/16  1:18 PM  Result Value Ref Range   Yeast Wet Prep HPF POC NONE SEEN NONE SEEN   Trich, Wet Prep PRESENT (A) NONE SEEN   Clue Cells Wet Prep HPF POC PRESENT (A) NONE SEEN   WBC, Wet Prep HPF POC MANY (A) NONE SEEN   Sperm NONE SEEN    Meds ordered this encounter  Medications  . metroNIDAZOLE (FLAGYL) tablet 2,000 mg   CLINICAL DATA:  Vaginal bleeding  in first trimester pregnancy. First trimester pregnancy with inconclusive fetal viability. Polysubstance abuse. EXAM: TRANSVAGINAL OB ULTRASOUND TECHNIQUE: Transvaginal ultrasound was performed for complete evaluation of the gestation as well as the maternal uterus, adnexal regions, and pelvic cul-de-sac. COMPARISON:  03/17/2016 FINDINGS: Intrauterine gestational sac: Single Yolk sac:  Visualized Embryo:  Not visualized MSD: 10  mm   5 w   5  d Subchorionic hemorrhage:  None visualized. Maternal uterus/adnexae: Small right ovarian corpus luteum noted. Normal appearance of left ovary. No mass or free fluid identified. IMPRESSION: Single intrauterine gestational sac measuring 5 weeks 5 days by mean sac diameter. This shows appropriate progression since recent exam. No significant maternal uterine or adnexal abnormality identified.   MDM UA, CBC, UDS collected UDS positive for cocaine, opioids, and THC Wet Prep, Chlamydia and Gonorrhea collected Transvaginal Ultrasound performed ruled out ectopic  Bleeding likely secondary to Trichomoniasis Patient stable upon discharge  Assessment and Plan  Patient is a 35 y.o. [id number]  that presents to MAU with painless, vaginal spotting x 24 hours.   Wet Prep indicated Trichomoniasis, given  2000 mg Flagyl in MAU. Bleeding likely secondary to trichomoniasis.   Patient to return to MAU if bleeding becomes heavy like a period or painful contraction like pains in the lower abdomen.  Follow-up in the clinic for  obstetric care.   Clyde Canterbury 03/20/2016, 2:05 PM

## 2016-03-20 NOTE — Discharge Instructions (Signed)
Trichomoniasis Trichomoniasis is an infection caused by an organism called Trichomonas. The infection can affect both women and men. In women, the outer female genitalia and the vagina are affected. In men, the penis is mainly affected, but the prostate and other reproductive organs can also be involved. Trichomoniasis is a sexually transmitted infection (STI) and is most often passed to another person through sexual contact.  RISK FACTORS  Having unprotected sexual intercourse.  Having sexual intercourse with an infected partner. SIGNS AND SYMPTOMS  Symptoms of trichomoniasis in women include:  Abnormal gray-green frothy vaginal discharge.  Itching and irritation of the vagina.  Itching and irritation of the area outside the vagina. Symptoms of trichomoniasis in men include:   Penile discharge with or without pain.  Pain during urination. This results from inflammation of the urethra. DIAGNOSIS  Trichomoniasis may be found during a Pap test or physical exam. Your health care provider may use one of the following methods to help diagnose this infection:  Testing the pH of the vagina with a test tape.  Using a vaginal swab test that checks for the Trichomonas organism. A test is available that provides results within a few minutes.  Examining a urine sample.  Testing vaginal secretions. Your health care provider may test you for other STIs, including HIV. TREATMENT   You may be given medicine to fight the infection. Women should inform their health care provider if they could be or are pregnant. Some medicines used to treat the infection should not be taken during pregnancy.  Your health care provider may recommend over-the-counter medicines or creams to decrease itching or irritation.  Your sexual partner will need to be treated if infected.  Your health care provider may test you for infection again 3 months after treatment. HOME CARE INSTRUCTIONS   Take medicines only as  directed by your health care provider.  Take over-the-counter medicine for itching or irritation as directed by your health care provider.  Do not have sexual intercourse while you have the infection.  Women should not douche or wear tampons while they have the infection.  Discuss your infection with your partner. Your partner may have gotten the infection from you, or you may have gotten it from your partner.  Have your sex partner get examined and treated if necessary.  Practice safe, informed, and protected sex.  See your health care provider for other STI testing. SEEK MEDICAL CARE IF:   You still have symptoms after you finish your medicine.  You develop abdominal pain.  You have pain when you urinate.  You have bleeding after sexual intercourse.  You develop a rash.  Your medicine makes you sick or makes you throw up (vomit). MAKE SURE YOU:  Understand these instructions.  Will watch your condition.  Will get help right away if you are not doing well or get worse.   This information is not intended to replace advice given to you by your health care provider. Make sure you discuss any questions you have with your health care provider.   Document Released: 02/04/2001 Document Revised: 09/01/2014 Document Reviewed: 05/23/2013 Elsevier Interactive Patient Education 2016 Elsevier Inc. Pelvic Rest Pelvic rest is sometimes recommended for women when:   The placenta is partially or completely covering the opening of the cervix (placenta previa).  There is bleeding between the uterine wall and the amniotic sac in the first trimester (subchorionic hemorrhage).  The cervix begins to open without labor starting (incompetent cervix, cervical insufficiency).  The labor  is too early (preterm labor). HOME CARE INSTRUCTIONS  Do not have sexual intercourse, stimulation, or an orgasm.  Do not use tampons, douche, or put anything in the vagina.  Do not lift anything over 10  pounds (4.5 kg).  Avoid strenuous activity or straining your pelvic muscles. SEEK MEDICAL CARE IF:  You have any vaginal bleeding during pregnancy. Treat this as a potential emergency.  You have cramping pain felt low in the stomach (stronger than menstrual cramps).  You notice vaginal discharge (watery, mucus, or bloody).  You have a low, dull backache.  There are regular contractions or uterine tightening. SEEK IMMEDIATE MEDICAL CARE IF: You have vaginal bleeding and have placenta previa.    This information is not intended to replace advice given to you by your health care provider. Make sure you discuss any questions you have with your health care provider.   Document Released: 12/06/2010 Document Revised: 11/03/2011 Document Reviewed: 02/12/2015 Elsevier Interactive Patient Education 2016 Elsevier Inc. Vaginal Bleeding During Pregnancy, First Trimester A small amount of bleeding (spotting) from the vagina is common in early pregnancy. Sometimes the bleeding is normal and is not a problem, and sometimes it is a sign of something serious. Be sure to tell your doctor about any bleeding from your vagina right away. HOME CARE  Watch your condition for any changes.  Follow your doctor's instructions about how active you can be.  If you are on bed rest:  You may need to stay in bed and only get up to use the bathroom.  You may be allowed to do some activities.  If you need help, make plans for someone to help you.  Write down:  The number of pads you use each day.  How often you change pads.  How soaked (saturated) your pads are.  Do not use tampons.  Do not douche.  Do not have sex or orgasms until your doctor says it is okay.  If you pass any tissue from your vagina, save the tissue so you can show it to your doctor.  Only take medicines as told by your doctor.  Do not take aspirin because it can make you bleed.  Keep all follow-up visits as told by your  doctor. GET HELP IF:   You bleed from your vagina.  You have cramps.  You have labor pains.  You have a fever that does not go away after you take medicine. GET HELP RIGHT AWAY IF:   You have very bad cramps in your back or belly (abdomen).  You pass large clots or tissue from your vagina.  You bleed more.  You feel light-headed or weak.  You pass out (faint).  You have chills.  You are leaking fluid or have a gush of fluid from your vagina.  You pass out while pooping (having a bowel movement). MAKE SURE YOU:  Understand these instructions.  Will watch your condition.  Will get help right away if you are not doing well or get worse.   This information is not intended to replace advice given to you by your health care provider. Make sure you discuss any questions you have with your health care provider.   Document Released: 12/26/2013 Document Reviewed: 12/26/2013 Elsevier Interactive Patient Education [employer]

## 2016-03-21 LAB — GC/CHLAMYDIA PROBE AMP (~~LOC~~) NOT AT ARMC
Chlamydia: NEGATIVE
Neisseria Gonorrhea: NEGATIVE

## 2016-03-21 LAB — HIV ANTIBODY (ROUTINE TESTING W REFLEX): HIV Screen 4th Generation wRfx: NONREACTIVE

## 2016-04-10 ENCOUNTER — Encounter: Payer: Medicaid Other | Admitting: Family

## 2016-04-19 ENCOUNTER — Encounter (HOSPITAL_COMMUNITY): Payer: Self-pay

## 2016-04-19 ENCOUNTER — Inpatient Hospital Stay (HOSPITAL_COMMUNITY): Payer: Medicaid Other

## 2016-04-19 ENCOUNTER — Inpatient Hospital Stay (HOSPITAL_COMMUNITY)
Admission: AD | Admit: 2016-04-19 | Discharge: 2016-04-19 | Disposition: A | Discharge status: Home / Self Care | Payer: Medicaid Other | Source: Ambulatory Visit | Attending: Obstetrics & Gynecology | Admitting: Obstetrics & Gynecology

## 2016-04-19 DIAGNOSIS — Z803 Family history of malignant neoplasm of breast: Secondary | ICD-10-CM | POA: Diagnosis not present

## 2016-04-19 DIAGNOSIS — A499 Bacterial infection, unspecified: Secondary | ICD-10-CM

## 2016-04-19 DIAGNOSIS — N760 Acute vaginitis: Secondary | ICD-10-CM

## 2016-04-19 DIAGNOSIS — O23591 Infection of other part of genital tract in pregnancy, first trimester: Secondary | ICD-10-CM | POA: Diagnosis not present

## 2016-04-19 DIAGNOSIS — O219 Vomiting of pregnancy, unspecified: Secondary | ICD-10-CM | POA: Diagnosis not present

## 2016-04-19 DIAGNOSIS — Z6790 Unspecified blood type, Rh positive: Secondary | ICD-10-CM | POA: Diagnosis not present

## 2016-04-19 DIAGNOSIS — O4691 Antepartum hemorrhage, unspecified, first trimester: Secondary | ICD-10-CM

## 2016-04-19 DIAGNOSIS — Z8349 Family history of other endocrine, nutritional and metabolic diseases: Secondary | ICD-10-CM | POA: Insufficient documentation

## 2016-04-19 DIAGNOSIS — O4690 Antepartum hemorrhage, unspecified, unspecified trimester: Secondary | ICD-10-CM

## 2016-04-19 DIAGNOSIS — O99321 Drug use complicating pregnancy, first trimester: Secondary | ICD-10-CM | POA: Diagnosis not present

## 2016-04-19 DIAGNOSIS — Z8619 Personal history of other infectious and parasitic diseases: Secondary | ICD-10-CM | POA: Diagnosis not present

## 2016-04-19 DIAGNOSIS — Z811 Family history of alcohol abuse and dependence: Secondary | ICD-10-CM | POA: Diagnosis not present

## 2016-04-19 DIAGNOSIS — B9689 Other specified bacterial agents as the cause of diseases classified elsewhere: Secondary | ICD-10-CM

## 2016-04-19 DIAGNOSIS — Z3A09 9 weeks gestation of pregnancy: Secondary | ICD-10-CM | POA: Insufficient documentation

## 2016-04-19 DIAGNOSIS — F17210 Nicotine dependence, cigarettes, uncomplicated: Secondary | ICD-10-CM | POA: Diagnosis not present

## 2016-04-19 DIAGNOSIS — Z3491 Encounter for supervision of normal pregnancy, unspecified, first trimester: Secondary | ICD-10-CM

## 2016-04-19 DIAGNOSIS — O99331 Smoking (tobacco) complicating pregnancy, first trimester: Secondary | ICD-10-CM | POA: Diagnosis not present

## 2016-04-19 DIAGNOSIS — O418X10 Other specified disorders of amniotic fluid and membranes, first trimester, not applicable or unspecified: Secondary | ICD-10-CM

## 2016-04-19 DIAGNOSIS — O468X1 Other antepartum hemorrhage, first trimester: Secondary | ICD-10-CM | POA: Diagnosis not present

## 2016-04-19 DIAGNOSIS — O209 Hemorrhage in early pregnancy, unspecified: Secondary | ICD-10-CM

## 2016-04-19 LAB — WET PREP, GENITAL
Sperm: NONE SEEN
Trich, Wet Prep: NONE SEEN
Yeast Wet Prep HPF POC: NONE SEEN

## 2016-04-19 LAB — CBC
HCT: 36.7 % (ref 36.0–46.0)
Hemoglobin: 12.3 g/dL (ref 12.0–15.0)
MCH: 31.6 pg (ref 26.0–34.0)
MCHC: 33.5 g/dL (ref 30.0–36.0)
MCV: 94.3 fL (ref 78.0–100.0)
Platelets: 183 10*3/uL (ref 150–400)
RBC: 3.89 MIL/uL (ref 3.87–5.11)
RDW: 13.6 % (ref 11.5–15.5)
WBC: 8.5 10*3/uL (ref 4.0–10.5)

## 2016-04-19 LAB — URINALYSIS, ROUTINE W REFLEX MICROSCOPIC
Bilirubin Urine: NEGATIVE
Glucose, UA: NEGATIVE mg/dL
Hgb urine dipstick: NEGATIVE
Ketones, ur: NEGATIVE mg/dL
Leukocytes, UA: NEGATIVE
Nitrite: NEGATIVE
Protein, ur: NEGATIVE mg/dL
Specific Gravity, Urine: >1.03 — ABNORMAL HIGH (ref 1.005–1.030)
pH: 6 (ref 5.0–8.0)

## 2016-04-19 LAB — ABO/RH: ABO/RH(D): A POS

## 2016-04-19 MED ORDER — METRONIDAZOLE 500 MG PO TABS: 500.0000 mg | ORAL_TABLET | Freq: Two times a day (BID) | ORAL | 0 refills | Status: DC

## 2016-04-19 NOTE — Discharge Instructions (Signed)
Bacterial Vaginosis Bacterial vaginosis is a vaginal infection that occurs when the normal balance of bacteria in the vagina is disrupted. It results from an overgrowth of certain bacteria. This is the most common vaginal infection in women of childbearing age. Treatment is important to prevent complications, especially in pregnant women, as it can cause a premature delivery. CAUSES  Bacterial vaginosis is caused by an increase in harmful bacteria that are normally present in smaller amounts in the vagina. Several different kinds of bacteria can cause bacterial vaginosis. However, the reason that the condition develops is not fully understood. RISK FACTORS Certain activities or behaviors can put you at an increased risk of developing bacterial vaginosis, including:  Having a new sex partner or multiple sex partners.  Douching.  Using an intrauterine device (IUD) for contraception. Women do not get bacterial vaginosis from toilet seats, bedding, swimming pools, or contact with objects around them. SIGNS AND SYMPTOMS  Some women with bacterial vaginosis have no signs or symptoms. Common symptoms include:  Grey vaginal discharge.  A fishlike odor with discharge, especially after sexual intercourse.  Itching or burning of the vagina and vulva.  Burning or pain with urination. DIAGNOSIS  Your health care provider will take a medical history and examine the vagina for signs of bacterial vaginosis. A sample of vaginal fluid may be taken. Your health care provider will look at this sample under a microscope to check for bacteria and abnormal cells. A vaginal pH test may also be done.  TREATMENT  Bacterial vaginosis may be treated with antibiotic medicines. These may be given in the form of a pill or a vaginal cream. A second round of antibiotics may be prescribed if the condition comes back after treatment. Because bacterial vaginosis increases your risk for sexually transmitted diseases, getting  treated can help reduce your risk for chlamydia, gonorrhea, HIV, and herpes. HOME CARE INSTRUCTIONS   Only take over-the-counter or prescription medicines as directed by your health care provider.  If antibiotic medicine was prescribed, take it as directed. Make sure you finish it even if you start to feel better.  Tell all sexual partners that you have a vaginal infection. They should see their health care provider and be treated if they have problems, such as a mild rash or itching.  During treatment, it is important that you follow these instructions:  Avoid sexual activity or use condoms correctly.  Do not douche.  Avoid alcohol as directed by your health care provider.  Avoid breastfeeding as directed by your health care provider. SEEK MEDICAL CARE IF:   Your symptoms are not improving after 3 days of treatment.  You have increased discharge or pain.  You have a fever. MAKE SURE YOU:   Understand these instructions.  Will watch your condition.  Will get help right away if you are not doing well or get worse. FOR MORE INFORMATION  Centers for Disease Control and Prevention, Division of STD Prevention: SolutionApps.co.zawww.cdc.gov/std American Sexual Health Association (ASHA): www.ashastd.org    This information is not intended to replace advice given to you by your health care provider. Make sure you discuss any questions you have with your health care provider.   Document Released: 08/11/2005 Document Revised: 09/01/2014 Document Reviewed: 03/23/2013 Elsevier Interactive Patient Education 2016 Elsevier Inc.  Subchorionic Hematoma A subchorionic hematoma is a gathering of blood between the outer wall of the placenta and the inner wall of the womb (uterus). The placenta is the organ that connects the fetus to the  wall of the uterus. The placenta performs the feeding, breathing (oxygen to the fetus), and waste removal (excretory work) of the fetus.  Subchorionic hematoma is the most  common abnormality found on a result from ultrasonography done during the first trimester or early second trimester of pregnancy. If there has been little or no vaginal bleeding, early small hematomas usually shrink on their own and do not affect your baby or pregnancy. The blood is gradually absorbed over 1-2 weeks. When bleeding starts later in pregnancy or the hematoma is larger or occurs in an older pregnant woman, the outcome may not be as good. Larger hematomas may get bigger, which increases the chances for miscarriage. Subchorionic hematoma also increases the risk of premature detachment of the placenta from the uterus, preterm (premature) labor, and stillbirth. HOME CARE INSTRUCTIONS Stay on bed rest if your health care provider recommends this. Although bed rest will not prevent more bleeding or prevent a miscarriage, your health care provider may recommend bed rest until you are advised otherwise. Avoid heavy lifting (more than 10 lb [4.5 kg]), exercise, sexual intercourse, or douching as directed by your health care provider. Keep track of the number of pads you use each day and how soaked (saturated) they are. Write down this information. Do not use tampons. Keep all follow-up appointments as directed by your health care provider. Your health care provider may ask you to have follow-up blood tests or ultrasound tests or both. SEEK IMMEDIATE MEDICAL CARE IF: You have severe cramps in your stomach, back, abdomen, or pelvis. You have a fever. You pass large clots or tissue. Save any tissue for your health care provider to look at. Your bleeding increases or you become lightheaded, feel weak, or have fainting episodes.   This information is not intended to replace advice given to you by your health care provider. Make sure you discuss any questions you have with your health care provider.   Document Released: 11/26/2006 Document Revised: 09/01/2014 Document Reviewed: 03/10/2013 Elsevier  Interactive Patient Education 2016 Elsevier Inc.  Subchorionic Hematoma A subchorionic hematoma is a gathering of blood between the outer wall of the placenta and the inner wall of the womb (uterus). The placenta is the organ that connects the fetus to the wall of the uterus. The placenta performs the feeding, breathing (oxygen to the fetus), and waste removal (excretory work) of the fetus.  Subchorionic hematoma is the most common abnormality found on a result from ultrasonography done during the first trimester or early second trimester of pregnancy. If there has been little or no vaginal bleeding, early small hematomas usually shrink on their own and do not affect your baby or pregnancy. The blood is gradually absorbed over 1-2 weeks. When bleeding starts later in pregnancy or the hematoma is larger or occurs in an older pregnant woman, the outcome may not be as good. Larger hematomas may get bigger, which increases the chances for miscarriage. Subchorionic hematoma also increases the risk of premature detachment of the placenta from the uterus, preterm (premature) labor, and stillbirth. HOME CARE INSTRUCTIONS  Stay on bed rest if your health care provider recommends this. Although bed rest will not prevent more bleeding or prevent a miscarriage, your health care provider may recommend bed rest until you are advised otherwise.  Avoid heavy lifting (more than 10 lb [4.5 kg]), exercise, sexual intercourse, or douching as directed by your health care provider.  Keep track of the number of pads you use each day and how soaked (  saturated) they are. Write down this information.  Do not use tampons.  Keep all follow-up appointments as directed by your health care provider. Your health care provider may ask you to have follow-up blood tests or ultrasound tests or both. SEEK IMMEDIATE MEDICAL CARE IF:  You have severe cramps in your stomach, back, abdomen, or pelvis.  You have a fever.  You pass  large clots or tissue. Save any tissue for your health care provider to look at.  Your bleeding increases or you become lightheaded, feel weak, or have fainting episodes.   This information is not intended to replace advice given to you by your health care provider. Make sure you discuss any questions you have with your health care provider.   Document Released: 11/26/2006 Document Revised: 09/01/2014 Document Reviewed: 03/10/2013 Elsevier Interactive Patient Education [employer]

## 2016-04-19 NOTE — MAU Note (Signed)
Vaginal Light pink bleeding 6 weeks ago, turned dark and then stopped.  On Thursday, bright red bleeding when wiped with weird odor. Then earlier today the blood was dark. Low abd pain dull ache x 2 days. Overly tired and nauseous.  Last vomit at 4 pm.  Just ate a corndog. Was supposed to have prenatal appt downstairs last week but was out of town and missed it.

## 2016-04-19 NOTE — MAU Provider Note (Signed)
History     CSN: [id number]  Arrival date and time: 04/19/16 11910318   None     Chief Complaint  Patient presents with  . Vaginal Bleeding  . Nausea  . Abdominal Pain   [id number] @[redacted]w[redacted]d  by LMP c/o intermittent spotting over the last several days. She describes as dark brown at times and bright red at times. She reports some odor when the blood is dark in color. She denies vaginal discharge, itching, or irritation. She denies pain. She reports daily nausea and vomiting and requests Rx for nausea. She admits to hx of drug use and recent cocaine use. She recently was on Methadone but took herself off about 6 days ago. She is planning to start Suboxone treatment in 2 days. She was seen about 1 month ago in MAU for VB and US revealed IUGS and YS but no FP, she was also treated for trich at that time. She missed her first appt for prenatal care.   OB History    Gravida Para Term Preterm AB Living   8 2 2   5 2    SAB TAB Ectopic Multiple Live Births   1 3            Past Medical History:  Diagnosis Date  . Abscess   . Hepatitis C   . History of MRSA infection   . IV drug user   . Polysubstance abuse     Past Surgical History:  Procedure Laterality Date  . CESAREAN SECTION  x 2    Family History  Problem Relation Age of Onset  . Cancer Mother     breast  . Thyroid disease Mother   . Alcohol abuse Father     Social History  Substance Use Topics  . Smoking status: Current Every Day Smoker    Packs/day: 1.00    Types: Cigarettes  . Smokeless tobacco: Never Used  . Alcohol use No     Comment: Pt stated she has started goint ot rehab clinic    Allergies: No Known Allergies  Prescriptions Prior to Admission  Medication Sig Dispense Refill Last Dose  . methadone (DOLOPHINE) 10 MG/5ML solution Take 30 mg by mouth daily.    Past Week at Unknown time  . prenatal vitamin w/FE, FA (NATACHEW) 29-1 MG CHEW chewable tablet Chew 1 tablet by mouth daily at 12 noon. Reported on  01/17/2016   04/18/2016 at Unknown time    Review of Systems  Constitutional: Negative.   Gastrointestinal: Positive for nausea and vomiting.  Genitourinary: Negative.    Physical Exam   Blood pressure 109/58, pulse 69, temperature 97.5 F (36.4 C), temperature source Oral, resp. rate 16, weight 68.2 kg (150 lb 6 oz), last menstrual period 02/10/2016, SpO2 100 %, unknown if currently breastfeeding.  Physical Exam  Constitutional: She is oriented to person, place, and time. She appears well-developed and well-nourished.  HENT:  Head: Normocephalic and atraumatic.  Neck: Normal range of motion.  Cardiovascular: Normal rate.   Respiratory: Effort normal.  GI: Soft. She exhibits no distension. There is no tenderness.  Genitourinary:  Genitourinary Comments: External: no lesions Vagina: rugated, parous, frothy brown odorous discharge Uterus: slightly enlarged, anteverted, non tender, no CMT Adnexae: no masses, no tenderness left, no tenderness right   Musculoskeletal: Normal range of motion.  Neurological: She is alert and oriented to person, place, and time.  Skin: Skin is warm and dry.  Psychiatric: She has a normal mood and affect.   Results for  orders placed or performed during the hospital encounter of 04/19/16 (from the past 24 hour(s))  Urinalysis, Routine w reflex microscopic (not at [facility])     Status: Abnormal   Collection Time: 04/19/16  3:50 AM  Result Value Ref Range   Color, Urine YELLOW YELLOW   APPearance CLEAR CLEAR   Specific Gravity, Urine >1.030 (H) 1.005 - 1.030   pH 6.0 5.0 - 8.0   Glucose, UA NEGATIVE NEGATIVE mg/dL   Hgb urine dipstick NEGATIVE NEGATIVE   Bilirubin Urine NEGATIVE NEGATIVE   Ketones, ur NEGATIVE NEGATIVE mg/dL   Protein, ur NEGATIVE NEGATIVE mg/dL   Nitrite NEGATIVE NEGATIVE   Leukocytes, UA NEGATIVE NEGATIVE  Wet prep, genital     Status: Abnormal   Collection Time: 04/19/16  4:55 AM  Result Value Ref Range   Yeast Wet Prep HPF POC  NONE SEEN NONE SEEN   Trich, Wet Prep NONE SEEN NONE SEEN   Clue Cells Wet Prep HPF POC PRESENT (A) NONE SEEN   WBC, Wet Prep HPF POC FEW (A) NONE SEEN   Sperm NONE SEEN   CBC     Status: None   Collection Time: 04/19/16  5:06 AM  Result Value Ref Range   WBC 8.5 4.0 - 10.5 K/uL   RBC 3.89 3.87 - 5.11 MIL/uL   Hemoglobin 12.3 12.0 - 15.0 g/dL   HCT 16.1 09.6 - 04.5 %   MCV 94.3 78.0 - 100.0 fL   MCH 31.6 26.0 - 34.0 pg   MCHC 33.5 30.0 - 36.0 g/dL   RDW 40.9 81.1 - 91.4 %   Platelets 183 150 - 400 K/uL  ABO/Rh     Status: None   Collection Time: 04/19/16  5:06 AM  Result Value Ref Range   ABO/RH(D) A POS    US Ob Transvaginal  Result Date: 04/19/2016 CLINICAL DATA:  Vaginal bleeding for 2 days. History of polysubstance abuse and hepatitis-C. Gestational age by prior ultrasound 5 weeks and 5 days. EXAM: TRANSVAGINAL OB ULTRASOUND TECHNIQUE: Transvaginal ultrasound was performed for complete evaluation of the gestation as well as the maternal uterus, adnexal regions, and pelvic cul-de-sac. COMPARISON:  Obstetric ultrasound March 20, 2016 FINDINGS: Intrauterine gestational sac: Present Yolk sac:  Present Embryo:  Present Cardiac Activity: Present Heart Rate: 166 bpm CRL:   28  mm   9 w 2 d                  [place] [facility]: November 20, 2016 Subchorionic hemorrhage:  Small subchorionic hemorrhage. Maternal uterus/adnexae: Normal appearance the RIGHT ovary, the LEFT ovary is not sonographically identified. No free fluid. IMPRESSION: Single live intrauterine pregnancy, gestational age by ultrasound 9 weeks and 2 days ; small subchorionic hemorrhage. Electronically Signed   By: Awilda Metro M.D.   On: 04/19/2016 05:55    MAU Course  Procedures  MDM Labs ordered and reviewed. Will treat BV. Small SCH. Stable for discharge home.   Assessment and Plan   1. Vaginal bleeding in pregnancy, first trimester   2. Vaginal bleeding in pregnancy   3. Subchorionic hemorrhage, first trimester   4. Viable  pregnancy in first trimester   5. Bacterial vaginosis   6. Rh(D) positive   7.      Nausea/vomiting of pregnancy Discharge home Flagyl 500 mg po bid x7 days Diclegis OTC regimen Avoid IC until sx resolved Bleeding precautions Follow up in [facility]-pt to call to reschedule  Donette Larry, CNM 04/19/2016, 5:00 AM

## 2016-04-21 LAB — GC/CHLAMYDIA PROBE AMP (~~LOC~~) NOT AT ARMC
Chlamydia: NEGATIVE
Neisseria Gonorrhea: NEGATIVE

## 2016-06-04 ENCOUNTER — Encounter (HOSPITAL_COMMUNITY): Payer: Self-pay | Admitting: *Deleted

## 2016-06-04 ENCOUNTER — Inpatient Hospital Stay (HOSPITAL_COMMUNITY)
Admission: AD | Admit: 2016-06-04 | Discharge: 2016-06-04 | Disposition: A | Discharge status: Home / Self Care | Payer: Medicaid Other | Source: Ambulatory Visit | Attending: Family Medicine | Admitting: Family Medicine

## 2016-06-04 DIAGNOSIS — Z3490 Encounter for supervision of normal pregnancy, unspecified, unspecified trimester: Secondary | ICD-10-CM

## 2016-06-04 DIAGNOSIS — O26892 Other specified pregnancy related conditions, second trimester: Secondary | ICD-10-CM | POA: Insufficient documentation

## 2016-06-04 DIAGNOSIS — O368120 Decreased fetal movements, second trimester, not applicable or unspecified: Secondary | ICD-10-CM | POA: Diagnosis not present

## 2016-06-04 DIAGNOSIS — Z3A18 18 weeks gestation of pregnancy: Secondary | ICD-10-CM | POA: Diagnosis not present

## 2016-06-04 DIAGNOSIS — Z3A16 16 weeks gestation of pregnancy: Secondary | ICD-10-CM | POA: Insufficient documentation

## 2016-06-04 DIAGNOSIS — Z711 Person with feared health complaint in whom no diagnosis is made: Secondary | ICD-10-CM

## 2016-06-04 NOTE — MAU Note (Signed)
Urine in lab 

## 2016-06-04 NOTE — Discharge Instructions (Signed)

## 2016-06-04 NOTE — MAU Provider Note (Signed)
S:  [name] is a 35 y.o. incarcerated female G8P2052 @ 7653w3d here with concerns about her babies heart beat. She had a Dr. Visit today and they were unable to hear the babies heart beat with doppler.   O:  GENERAL: Well-developed, well-nourished female in no acute distress.  LUNGS: Effort normal SKIN: Warm, dry and without erythema PSYCH: Normal mood and affect  Vitals:   06/04/16 1216  BP: (!) 109/52  Pulse: 79  Resp: 18  Temp: 98.4 F (36.9 C)    MDM:  + fetal hear tones via doppler without difficulty.    A:  1. Presence of fetal heart sounds, antepartum   2. Physically well but worried     P:  Discharge home in stable condition Return precautions discussed Follow up with OB as scheduled  Duane LopeJennifer I Dulcinea Kinser, NP 06/04/2016 5:30 PM

## 2016-06-04 NOTE — MAU Note (Signed)
Brought in to MAU to check FHR; has had some 1st trimester bleeding;

## 2016-06-05 ENCOUNTER — Other Ambulatory Visit (HOSPITAL_COMMUNITY)
Admission: RE | Admit: 2016-06-05 | Discharge: 2016-06-05 | Disposition: A | Discharge status: Home / Self Care | Payer: Medicaid Other | Source: Ambulatory Visit | Attending: Certified Nurse Midwife | Admitting: Certified Nurse Midwife

## 2016-06-05 ENCOUNTER — Encounter: Payer: Self-pay | Admitting: Certified Nurse Midwife

## 2016-06-05 ENCOUNTER — Encounter: Payer: Medicaid Other | Admitting: Certified Nurse Midwife

## 2016-06-05 ENCOUNTER — Ambulatory Visit (INDEPENDENT_AMBULATORY_CARE_PROVIDER_SITE_OTHER): Payer: Medicaid Other | Admitting: Certified Nurse Midwife

## 2016-06-05 VITALS — BP 96/63 | HR 67 | Temp 98.0°F

## 2016-06-05 DIAGNOSIS — O99322 Drug use complicating pregnancy, second trimester: Secondary | ICD-10-CM | POA: Diagnosis not present

## 2016-06-05 DIAGNOSIS — O219 Vomiting of pregnancy, unspecified: Secondary | ICD-10-CM

## 2016-06-05 DIAGNOSIS — F1120 Opioid dependence, uncomplicated: Secondary | ICD-10-CM

## 2016-06-05 DIAGNOSIS — Z01419 Encounter for gynecological examination (general) (routine) without abnormal findings: Secondary | ICD-10-CM | POA: Diagnosis present

## 2016-06-05 DIAGNOSIS — Z1151 Encounter for screening for human papillomavirus (HPV): Secondary | ICD-10-CM | POA: Diagnosis present

## 2016-06-05 DIAGNOSIS — Z113 Encounter for screening for infections with a predominantly sexual mode of transmission: Secondary | ICD-10-CM | POA: Diagnosis present

## 2016-06-05 DIAGNOSIS — N760 Acute vaginitis: Secondary | ICD-10-CM | POA: Diagnosis present

## 2016-06-05 DIAGNOSIS — O0990 Supervision of high risk pregnancy, unspecified, unspecified trimester: Secondary | ICD-10-CM

## 2016-06-05 MED ORDER — VITAFOL GUMMIES 3.33-0.333-34.8 MG PO CHEW: 3.0000 | CHEWABLE_TABLET | Freq: Every day | ORAL | 12 refills | Status: DC

## 2016-06-05 MED ORDER — ONDANSETRON HCL 8 MG PO TABS: 8.0000 mg | ORAL_TABLET | Freq: Three times a day (TID) | ORAL | 2 refills | Status: DC | PRN

## 2016-06-05 NOTE — Progress Notes (Addendum)
Subjective:    Tiffany Salazar is being seen today for her first obstetrical visit.  This is not a planned pregnancy. She is at [redacted]w[redacted]d gestation. Her obstetrical history is significant for on methadone for about a year, hx of cocaine abuse, recent heroin use.  Is in methadone clinic at [facility]. Relationship with FOB: not involved. Patient does intend to breast feed. Pregnancy history fully reviewed.  Current incarceration.  Kids are with family.    The information documented in the HPI was reviewed and verified.  Menstrual History: OB History    Gravida Para Term Preterm AB Living   8 2 2   5 2    SAB TAB Ectopic Multiple Live Births   1 53            Menarche age: 10 years age.  Patient's last menstrual period was 01/31/2016.    Past Medical History:  Diagnosis Date  . Abscess   . Concussion   . Hepatitis C   . History of MRSA infection   . IV drug user   . Polysubstance abuse     Past Surgical History:  Procedure Laterality Date  . CESAREAN SECTION  x 2     (Not in a hospital admission) No Known Allergies  Social History  Substance Use Topics  . Smoking status: Current Some Day Smoker    Packs/day: 1.00    Types: Cigarettes  . Smokeless tobacco: Current User  . Alcohol use No     Comment: Pt stated she has started goint ot rehab clinic    Family History  Problem Relation Age of Onset  . Cancer Mother     breast  . Thyroid disease Mother   . Alcohol abuse Father      Review of Systems Constitutional: negative for weight loss Gastrointestinal: + for vomiting, + nausea Genitourinary:negative for genital lesions and vaginal discharge and dysuria, + hx of vaginal bleeding Musculoskeletal:negative for back pain Behavioral/Psych: negative for abusive relationship, depression, illegal drug usage and tobacco use    Objective:    BP 96/63   Pulse 67   Temp 98 F (36.7 C)   LMP 01/31/2016  General Appearance:    Alert, cooperative, no distress, appears stated age   Head:    Normocephalic, without obvious abnormality, atraumatic  Eyes:    PERRL, conjunctiva/corneas clear, EOM's intact, fundi    benign, both eyes  Ears:    Normal TM's and external ear canals, both ears  Nose:   Nares normal, septum midline, mucosa normal, no drainage    or sinus tenderness  Throat:   Lips, mucosa, and tongue normal; teeth and gums normal  Neck:   Supple, symmetrical, trachea midline, no adenopathy;    thyroid:  no enlargement/tenderness/nodules; no carotid   bruit or JVD  Back:     Symmetric, no curvature, ROM normal, no CVA tenderness  Lungs:     Clear to auscultation bilaterally, respirations unlabored  Chest Wall:    No tenderness or deformity   Heart:    Regular rate and rhythm, S1 and S2 normal, no murmur, rub   or gallop  Breast Exam:    No tenderness, masses, or nipple abnormality  Abdomen:     Soft, non-tender, bowel sounds active all four quadrants,    no masses, no organomegaly  Genitalia:    Normal female without lesion, discharge or tenderness  Extremities:   Extremities normal, atraumatic, no cyanosis or edema  Pulses:   2+ and symmetric  all extremities  Skin:   Skin color, texture, turgor normal, no rashes or lesions  Lymph nodes:   Cervical, supraclavicular, and axillary nodes normal  Neurologic:   CNII-XII intact, normal strength, sensation and reflexes    throughout     Cervix: long, thick, closed and posterior.  FHR: 145 by doppler    FH: @U .      Lab Review Urine pregnancy test Labs reviewed yes Radiologic studies reviewed yes Assessment:    Pregnancy at [redacted]w[redacted]d weeks   Incarceration  Polysubstance abuse  H/O Hep C.  H/O wound cellulitis  TOLAC: consent form signed 06/05/16   Plan:     Prenatal vitamins.  Counseling provided regarding continued use of seat belts, cessation of alcohol consumption, smoking or use of illicit drugs; infection precautions i.e., influenza/TDAP immunizations, toxoplasmosis,CMV, parvovirus, listeria and  varicella; workplace safety, exercise during pregnancy; routine dental care, safe medications, sexual activity, hot tubs, saunas, pools, travel, caffeine use, fish and methlymercury, potential toxins, hair treatments, varicose veins Weight gain recommendations per IOM guidelines reviewed: underweight/BMI< 18.5--> gain 28 - 40 lbs; normal weight/BMI 18.5 - 24.9--> gain 25 - 35 lbs; overweight/BMI 25 - 29.9--> gain 15 - 25 lbs; obese/BMI >30->gain  11 - 20 lbs Problem list reviewed and updated. FIRST/CF mutation testing/NIPT/QUAD SCREEN/fragile X/Ashkenazi Jewish population testing/Spinal muscular atrophy discussed: ordered. Role of ultrasound in pregnancy discussed; fetal survey: ordered. Amniocentesis discussed: not indicated. VBAC calculator score: VBAC consent form provided Meds ordered this encounter  Medications  . DISCONTD: ondansetron (ZOFRAN) 8 MG tablet    Sig: Take 1 tablet (8 mg total) by mouth every 8 (eight) hours as needed for nausea or vomiting.    Dispense:  40 tablet    Refill:  2  . Prenatal Vit-Fe Phos-FA-Omega (VITAFOL GUMMIES) 3.33-0.333-34.8 MG CHEW    Sig: Chew 3 tablets by mouth daily.    Dispense:  90 tablet    Refill:  12  . ondansetron (ZOFRAN) 8 MG tablet    Sig: Take 1 tablet (8 mg total) by mouth every 8 (eight) hours as needed for nausea or vomiting.    Dispense:  40 tablet    Refill:  2   Orders Placed This Encounter  Procedures  . Culture, OB Urine  . [place] MFM OB DETAIL +14 WK    Standing Status:   Future    Standing Expiration Date:   08/05/2017    Order Specific Question:   Reason for Exam (SYMPTOM  OR DIAGNOSIS REQUIRED)    Answer:   fetal anatomy scan    Order Specific Question:   Preferred imaging location?    Answer:   [facility]  . TSH  . HIV antibody  . Hemoglobinopathy evaluation  . Varicella zoster antibody, IgG  . Prenatal Profile I  . ToxASSURE Select 13 (MW), Urine  . Hemoglobin A1c  . MaterniT Genome    Order Specific Question:    Is the patient insulin dependent?    Answer:   No    Order Specific Question:   Please enter gestational age. This should be expressed as weeks AND days, i.e. 16w 6d. Enter weeks here. Enter days in next question.    Answer:   58    Order Specific Question:   Please enter gestational age. This should be expressed as weeks AND days, i.e. 16w 6d. Enter days here. Enter weeks in previous question.    Answer:   0    Order Specific Question:   How was gestational  age calculated?    Answer:   Ultrasound    Order Specific Question:   Please give the date of LMP OR Ultrasound OR Estimated date of delivery.    Answer:   11/06/2016    Order Specific Question:   Number of Fetuses (Type of Pregnancy):    Answer:   1    Order Specific Question:   Indications for performing the test? (please choose all that apply):    Answer:   Routine screening    Order Specific Question:   Other Indications? (Y=Yes, N=No)    Answer:   Y    Order Specific Question:   Please specify other indications, if any:    Answer:   polysubstance abuse, hep C    Order Specific Question:   If this is a repeat specimen, please indicate the reason:    Answer:   Not indicated    Order Specific Question:   Please specify the patient's race: (C=White/Caucasion, B=Black, I=Native American, A=Asian, H=Hispanic, O=Other, U=Unknown)    Answer:   C    Order Specific Question:   Donor Egg - indicate if the egg was obtained from in vitro fertilization.    Answer:   N    Order Specific Question:   Age of Egg Donor.    Answer:   [age]    Order Specific Question:   Prior Down Syndrome/ONTD screening during current pregnancy.    Answer:   N    Order Specific Question:   Prior First Trimester Testing    Answer:   N    Order Specific Question:   Prior Second Trimester Testing    Answer:   N    Order Specific Question:   Family History of Neural Tube Defects    Answer:   N    Order Specific Question:   Prior Pregnancy with Down Syndrome     Answer:   N    Order Specific Question:   Please give the patient's weight (in pounds)    Answer:   150  . AMB referral to maternal fetal medicine    Referral Priority:   Urgent    Referral Type:   Consultation    Referral Reason:   Specialty Services Required    Number of Visits Requested:   1    Follow up in 4 weeks. 50% of 30 min visit spent on counseling and coordination of care.

## 2016-06-05 NOTE — Addendum Note (Signed)
Addended by: Samantha CrimesENNEY, Kayvion Arneson ANNE on: 06/05/2016 04:48 PM   Modules accepted: Orders

## 2016-06-07 LAB — CULTURE, OB URINE

## 2016-06-07 LAB — URINE CULTURE, OB REFLEX

## 2016-06-09 ENCOUNTER — Encounter: Payer: Medicaid Other | Admitting: Obstetrics and Gynecology

## 2016-06-09 LAB — CERVICOVAGINAL ANCILLARY ONLY
Chlamydia: NEGATIVE
Neisseria Gonorrhea: NEGATIVE
Trichomonas: NEGATIVE

## 2016-06-10 LAB — CYTOLOGY - PAP
Diagnosis: NEGATIVE
HPV: NOT DETECTED

## 2016-06-11 ENCOUNTER — Encounter (HOSPITAL_COMMUNITY): Payer: Self-pay | Admitting: Certified Nurse Midwife

## 2016-06-11 LAB — CERVICOVAGINAL ANCILLARY ONLY
Bacterial vaginitis: POSITIVE — AB
Candida vaginitis: NEGATIVE

## 2016-06-12 LAB — PRENATAL PROFILE I(LABCORP)
Antibody Screen: NEGATIVE
Basophils Absolute: 0 10*3/uL (ref 0.0–0.2)
Basos: 0 %
EOS (ABSOLUTE): 0.2 10*3/uL (ref 0.0–0.4)
Eos: 4 %
Hematocrit: 36 % (ref 34.0–46.6)
Hemoglobin: 11.7 g/dL (ref 11.1–15.9)
Hepatitis B Surface Ag: NEGATIVE
Immature Grans (Abs): 0 10*3/uL (ref 0.0–0.1)
Immature Granulocytes: 0 %
Lymphocytes Absolute: 2.1 10*3/uL (ref 0.7–3.1)
Lymphs: 38 %
MCH: 31.7 pg (ref 26.6–33.0)
MCHC: 32.5 g/dL (ref 31.5–35.7)
MCV: 98 fL — ABNORMAL HIGH (ref 79–97)
Monocytes Absolute: 0.6 10*3/uL (ref 0.1–0.9)
Monocytes: 11 %
Neutrophils Absolute: 2.5 10*3/uL (ref 1.4–7.0)
Neutrophils: 47 %
Platelets: 153 10*3/uL (ref 150–379)
RBC: 3.69 x10E6/uL — ABNORMAL LOW (ref 3.77–5.28)
RDW: 13.4 % (ref 12.3–15.4)
RPR Ser Ql: NONREACTIVE
Rh Factor: POSITIVE
Rubella Antibodies, IGG: 1.82 index (ref >0.99)
WBC: 5.4 10*3/uL (ref 3.4–10.8)

## 2016-06-12 LAB — HEMOGLOBIN A1C
Est. average glucose Bld gHb Est-mCnc: 108 mg/dL
Hgb A1c MFr Bld: 5.4 % (ref 4.8–5.6)

## 2016-06-12 LAB — HEMOGLOBINOPATHY EVALUATION
HGB C: 0 %
HGB S: 0 %
Hemoglobin A2 Quantitation: 2.6 % (ref 0.7–3.1)
Hemoglobin F Quantitation: 0 % (ref 0.0–2.0)
Hgb A: 97.4 % (ref 94.0–98.0)

## 2016-06-12 LAB — TOXASSURE SELECT 13 (MW), URINE

## 2016-06-12 LAB — HIV ANTIBODY (ROUTINE TESTING W REFLEX): HIV Screen 4th Generation wRfx: NONREACTIVE

## 2016-06-12 LAB — VARICELLA ZOSTER ANTIBODY, IGG: Varicella zoster IgG: 1040 {index}

## 2016-06-12 LAB — TSH: TSH: 4.36 u[IU]/mL (ref 0.450–4.500)

## 2016-06-17 ENCOUNTER — Other Ambulatory Visit: Payer: Self-pay | Admitting: Certified Nurse Midwife

## 2016-06-17 DIAGNOSIS — B9689 Other specified bacterial agents as the cause of diseases classified elsewhere: Secondary | ICD-10-CM

## 2016-06-17 DIAGNOSIS — N760 Acute vaginitis: Secondary | ICD-10-CM

## 2016-06-17 LAB — MATERNIT GENOME: PDF: 0

## 2016-06-17 MED ORDER — METRONIDAZOLE 500 MG PO TABS: 500.0000 mg | ORAL_TABLET | Freq: Two times a day (BID) | ORAL | 0 refills | Status: DC

## 2016-06-18 ENCOUNTER — Telehealth: Payer: Self-pay | Admitting: *Deleted

## 2016-06-18 ENCOUNTER — Other Ambulatory Visit: Payer: Self-pay | Admitting: *Deleted

## 2016-06-18 DIAGNOSIS — B9689 Other specified bacterial agents as the cause of diseases classified elsewhere: Secondary | ICD-10-CM

## 2016-06-18 DIAGNOSIS — N760 Acute vaginitis: Secondary | ICD-10-CM

## 2016-06-18 MED ORDER — METRONIDAZOLE 500 MG PO TABS: 500.0000 mg | ORAL_TABLET | Freq: Two times a day (BID) | ORAL | 0 refills | Status: DC

## 2016-06-18 NOTE — Telephone Encounter (Signed)
Attempt to contact pt regarding lab results. Pt needs to be made aware that Flagyl has been sent to her pharmacy for +BV.

## 2016-06-20 ENCOUNTER — Ambulatory Visit (HOSPITAL_COMMUNITY): Admission: RE | Admit: 2016-06-20 | Payer: Medicaid Other | Source: Ambulatory Visit

## 2016-06-20 ENCOUNTER — Ambulatory Visit (HOSPITAL_COMMUNITY): Payer: Medicaid Other

## 2016-07-01 ENCOUNTER — Ambulatory Visit (HOSPITAL_COMMUNITY)
Admission: RE | Admit: 2016-07-01 | Discharge: 2016-07-01 | Disposition: A | Discharge status: Home / Self Care | Payer: Medicaid Other | Source: Ambulatory Visit | Attending: Certified Nurse Midwife | Admitting: Certified Nurse Midwife

## 2016-07-01 ENCOUNTER — Other Ambulatory Visit: Payer: Self-pay | Admitting: Certified Nurse Midwife

## 2016-07-01 ENCOUNTER — Encounter (HOSPITAL_COMMUNITY): Payer: Self-pay

## 2016-07-01 DIAGNOSIS — Z3A19 19 weeks gestation of pregnancy: Secondary | ICD-10-CM | POA: Insufficient documentation

## 2016-07-01 DIAGNOSIS — O0932 Supervision of pregnancy with insufficient antenatal care, second trimester: Secondary | ICD-10-CM | POA: Diagnosis not present

## 2016-07-01 DIAGNOSIS — F1910 Other psychoactive substance abuse, uncomplicated: Secondary | ICD-10-CM

## 2016-07-01 DIAGNOSIS — O0990 Supervision of high risk pregnancy, unspecified, unspecified trimester: Secondary | ICD-10-CM

## 2016-07-01 DIAGNOSIS — O99322 Drug use complicating pregnancy, second trimester: Secondary | ICD-10-CM

## 2016-07-01 DIAGNOSIS — Z98891 History of uterine scar from previous surgery: Secondary | ICD-10-CM

## 2016-07-01 DIAGNOSIS — O34219 Maternal care for unspecified type scar from previous cesarean delivery: Secondary | ICD-10-CM | POA: Insufficient documentation

## 2016-07-01 DIAGNOSIS — O365920 Maternal care for other known or suspected poor fetal growth, second trimester, not applicable or unspecified: Secondary | ICD-10-CM

## 2016-07-01 NOTE — Consult Note (Signed)
Maternal Fetal Medicine Consultation  Requesting Provider(s): Valentina Lucksachelle Denny, CNM  Reason for consultation: Patient on methadone, hx of cocaine abuse  HPI: Tiffany Salazar is a 35 yo [id number], EDD 11/21/2015 (by early ultrasound) who is seen for consultation due to drug abuse.  The patient is currently incarcerated.  Ms. Courtney ParisGodwin reports a history of cocaine abuse in early pregnancy - none since her incarceration (last 2 months).  She reports a previous history of Heroine use - she is currently on Methadone 120 mg daily - [facility]  The patient reports that she was recently diagnosed with Hepatitis C - was scheduled to be evaluated and treated by Infectious disease when she found out that she was pregnant.  She is otherwise without complaints today.  OB History: OB History    Gravida Para Term Preterm AB Living   6 2 2   3 2    SAB TAB Ectopic Multiple Live Births   1 2            PMH:  Past Medical History:  Diagnosis Date  . Abscess   . Concussion   . Hepatitis C   . History of MRSA infection   . IV drug user   . Polysubstance abuse     PSH:  Past Surgical History:  Procedure Laterality Date  . CESAREAN SECTION  x 2   Meds:  Current Outpatient Prescriptions on File Prior to Encounter  Medication Sig Dispense Refill  . methadone (DOLOPHINE) 10 MG/5ML solution Take 30 mg by mouth daily.     . metroNIDAZOLE (FLAGYL) 500 MG tablet Take 1 tablet (500 mg total) by mouth 2 (two) times daily. (Patient not taking: Reported on 07/01/2016) 14 tablet 0  . ondansetron (ZOFRAN) 8 MG tablet Take 1 tablet (8 mg total) by mouth every 8 (eight) hours as needed for nausea or vomiting. 40 tablet 2  . Prenatal Vit-Fe Phos-FA-Omega (VITAFOL GUMMIES) 3.33-0.333-34.8 MG CHEW Chew 3 tablets by mouth daily. 90 tablet 12  . prenatal vitamin w/FE, FA (NATACHEW) 29-1 MG CHEW chewable tablet Chew 1 tablet by mouth daily at 12 noon. Reported on 01/17/2016     No current  facility-administered medications on file prior to encounter.    Allergies: No Known Allergies   FH:  Family History  Problem Relation Age of Onset  . Cancer Mother     breast  . Thyroid disease Mother   . Alcohol abuse Father   Denies family history of birth defects or hereditary disorders  Soc:  Social History   Social History  . Marital status: Single    Spouse name: N/A  . Number of children: N/A  . Years of education: N/A   Occupational History  . unemployed    Social History Main Topics  . Smoking status: Current Some Day Smoker    Packs/day: 1.00    Types: Cigarettes  . Smokeless tobacco: Current User  . Alcohol use No     Comment: Pt stated she has started goint ot rehab clinic  . Drug use:     Types: IV, Cocaine, Heroin, Marijuana     Comment: Cocaine last use was 04/17/2016. Heroin last use was 04/18/2016. Methadone last use was 04/12/2016. Marijuana 04/18/2016.  Marland Kitchen. Sexual activity: Yes    Partners: Female, Female    Birth control/ protection: None     Comment: Heroin    Other Topics Concern  . Not on file   Social History Narrative  .  No narrative on file    Review of Systems: no vaginal bleeding or cramping/contractions, no LOF, no nausea/vomiting. All other systems reviewed and are negative.   PE:  164.4 lbs, 106/57, 61  GEN: well-appearing female ABD: gravid, NT  Ultrasound: Single IUP at 19w 5d A small left choroid plexus cyst was noted (see comments) Limited views of the fetal spine, ductal arch obtained The remainder of the fetal anatomy appears normal Ultrasound measurements are consistent with early ultrasound Posterior placenta without previa Normal amniotic fluid volume   A/P: 1) Single IUP at 19w 5d  2) History of drug abuse - the patient is currently on Methadone 120 mg daily.  Had brief discussion regarding risk of fetal abstinence syndrome - risk of neonatal withdrawal and possible prolonged NICU admission.  Would like to avoid  withdrawal symptoms during pregnancy, but would try to maintain the lowest possible dose needed.  The patient reports that she is "clean" and off cocaine since her incarceration and plans to enroll in a rehab program once she is released from prison. Recommend serial ultrasounds for growth every 4-6 weeks throughout the course of pregnancy.  3) Hepatitis C -  Hepatitis C is associated with a 5-6% risk of maternal to fetal transmission rate, but at this time, treatment is not recommended during pregnancy.  The patient will require referral and treatment after delivery.  If not yet performed, recommend baseline LFTs given the patient's history.    Thank you for the opportunity to be a part of the care of Tiffany Shepherdracy N Thompson. Please contact our office if we can be of further assistance.   I spent approximately 30 minutes with this patient with over 50% of time spent in face-to-face counseling.  Alpha GulaPaul Berlie Hatchel, MD Maternal Fetal Medicine

## 2016-07-02 ENCOUNTER — Other Ambulatory Visit (HOSPITAL_COMMUNITY): Payer: Self-pay | Admitting: *Deleted

## 2016-07-02 DIAGNOSIS — O99320 Drug use complicating pregnancy, unspecified trimester: Principal | ICD-10-CM

## 2016-07-02 DIAGNOSIS — F1120 Opioid dependence, uncomplicated: Secondary | ICD-10-CM

## 2016-07-03 ENCOUNTER — Encounter: Payer: Medicaid Other | Admitting: Certified Nurse Midwife

## 2016-07-03 NOTE — Telephone Encounter (Signed)
Busy #- letter mailed

## 2016-07-07 ENCOUNTER — Ambulatory Visit (INDEPENDENT_AMBULATORY_CARE_PROVIDER_SITE_OTHER): Payer: Medicaid Other | Admitting: Certified Nurse Midwife

## 2016-07-07 VITALS — BP 105/60 | HR 60 | Wt 164.8 lb

## 2016-07-07 DIAGNOSIS — N760 Acute vaginitis: Secondary | ICD-10-CM

## 2016-07-07 DIAGNOSIS — O23592 Infection of other part of genital tract in pregnancy, second trimester: Secondary | ICD-10-CM

## 2016-07-07 DIAGNOSIS — B9689 Other specified bacterial agents as the cause of diseases classified elsewhere: Secondary | ICD-10-CM | POA: Diagnosis not present

## 2016-07-07 DIAGNOSIS — Z23 Encounter for immunization: Secondary | ICD-10-CM

## 2016-07-07 DIAGNOSIS — O0992 Supervision of high risk pregnancy, unspecified, second trimester: Secondary | ICD-10-CM

## 2016-07-07 DIAGNOSIS — O0990 Supervision of high risk pregnancy, unspecified, unspecified trimester: Secondary | ICD-10-CM

## 2016-07-07 MED ORDER — METRONIDAZOLE 500 MG PO TABS: 500.0000 mg | ORAL_TABLET | Freq: Two times a day (BID) | ORAL | 0 refills | Status: DC
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
## 2016-07-10 ENCOUNTER — Other Ambulatory Visit: Payer: Self-pay | Admitting: Certified Nurse Midwife

## 2016-07-15 ENCOUNTER — Other Ambulatory Visit: Payer: Self-pay | Admitting: Certified Nurse Midwife

## 2016-07-15 DIAGNOSIS — O0990 Supervision of high risk pregnancy, unspecified, unspecified trimester: Secondary | ICD-10-CM

## 2016-07-15 LAB — MATERNIT GENOME

## 2016-07-28 ENCOUNTER — Encounter (HOSPITAL_COMMUNITY): Payer: Self-pay

## 2016-07-29 ENCOUNTER — Ambulatory Visit (HOSPITAL_COMMUNITY)
Admission: RE | Admit: 2016-07-29 | Discharge: 2016-07-29 | Disposition: A | Discharge status: Home / Self Care | Payer: Medicaid Other | Source: Ambulatory Visit | Attending: Certified Nurse Midwife | Admitting: Certified Nurse Midwife

## 2016-07-29 ENCOUNTER — Other Ambulatory Visit (HOSPITAL_COMMUNITY): Payer: Self-pay | Admitting: Maternal and Fetal Medicine

## 2016-07-29 ENCOUNTER — Encounter (HOSPITAL_COMMUNITY): Payer: Self-pay

## 2016-07-29 DIAGNOSIS — O0932 Supervision of pregnancy with insufficient antenatal care, second trimester: Secondary | ICD-10-CM | POA: Insufficient documentation

## 2016-07-29 DIAGNOSIS — O34211 Maternal care for low transverse scar from previous cesarean delivery: Secondary | ICD-10-CM | POA: Insufficient documentation

## 2016-07-29 DIAGNOSIS — F1910 Other psychoactive substance abuse, uncomplicated: Secondary | ICD-10-CM | POA: Insufficient documentation

## 2016-07-29 DIAGNOSIS — O99320 Drug use complicating pregnancy, unspecified trimester: Secondary | ICD-10-CM

## 2016-07-29 DIAGNOSIS — O34219 Maternal care for unspecified type scar from previous cesarean delivery: Secondary | ICD-10-CM

## 2016-07-29 DIAGNOSIS — Z3A23 23 weeks gestation of pregnancy: Secondary | ICD-10-CM

## 2016-07-29 DIAGNOSIS — F1120 Opioid dependence, uncomplicated: Secondary | ICD-10-CM

## 2016-07-29 DIAGNOSIS — Z362 Encounter for other antenatal screening follow-up: Secondary | ICD-10-CM | POA: Insufficient documentation

## 2016-07-29 DIAGNOSIS — O99322 Drug use complicating pregnancy, second trimester: Secondary | ICD-10-CM | POA: Insufficient documentation

## 2016-08-04 ENCOUNTER — Other Ambulatory Visit: Payer: Medicaid Other

## 2016-08-04 ENCOUNTER — Encounter: Payer: Medicaid Other | Admitting: Obstetrics & Gynecology

## 2016-08-04 ENCOUNTER — Encounter: Payer: Self-pay | Admitting: Obstetrics & Gynecology

## 2016-08-04 DIAGNOSIS — O98419 Viral hepatitis complicating pregnancy, unspecified trimester: Secondary | ICD-10-CM | POA: Insufficient documentation

## 2016-08-04 DIAGNOSIS — B182 Chronic viral hepatitis C: Secondary | ICD-10-CM | POA: Insufficient documentation

## 2016-08-22 ENCOUNTER — Other Ambulatory Visit: Payer: Medicaid Other

## 2016-08-22 ENCOUNTER — Encounter: Payer: Medicaid Other | Admitting: Obstetrics

## 2016-11-13 DIAGNOSIS — F329 Major depressive disorder, single episode, unspecified: Secondary | ICD-10-CM | POA: Insufficient documentation

## 2016-11-13 DIAGNOSIS — F32A Depression, unspecified: Secondary | ICD-10-CM | POA: Insufficient documentation

## 2017-01-23 ENCOUNTER — Encounter (HOSPITAL_COMMUNITY): Payer: Self-pay

## 2017-06-11 ENCOUNTER — Emergency Department (HOSPITAL_COMMUNITY)
Admission: EM | Admit: 2017-06-11 | Discharge: 2017-06-11 | Disposition: A | Discharge status: Home / Self Care | Payer: Self-pay | Attending: Emergency Medicine | Admitting: Emergency Medicine

## 2017-06-11 ENCOUNTER — Encounter (HOSPITAL_COMMUNITY): Payer: Self-pay | Admitting: Emergency Medicine

## 2017-06-11 DIAGNOSIS — F17210 Nicotine dependence, cigarettes, uncomplicated: Secondary | ICD-10-CM | POA: Insufficient documentation

## 2017-06-11 DIAGNOSIS — Z8614 Personal history of Methicillin resistant Staphylococcus aureus infection: Secondary | ICD-10-CM | POA: Insufficient documentation

## 2017-06-11 DIAGNOSIS — L989 Disorder of the skin and subcutaneous tissue, unspecified: Secondary | ICD-10-CM | POA: Insufficient documentation

## 2017-06-11 DIAGNOSIS — Z87898 Personal history of other specified conditions: Secondary | ICD-10-CM | POA: Insufficient documentation

## 2017-06-11 MED ORDER — DOXYCYCLINE HYCLATE 100 MG PO CAPS: 100.0000 mg | ORAL_CAPSULE | Freq: Two times a day (BID) | ORAL | 0 refills | Status: AC

## 2017-06-11 MED ORDER — DOXYCYCLINE HYCLATE 100 MG PO CAPS
100.0000 mg | ORAL_CAPSULE | Freq: Two times a day (BID) | ORAL | 0 refills | Status: DC
Start: 2017-06-11 — End: 2017-06-11
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
## 2017-06-11 NOTE — ED Notes (Signed)
See providers notes

## 2017-06-11 NOTE — Discharge Instructions (Signed)
Your lesions may be from superficial skin infection.   Take antibiotic as prescribed.   Warm compresses twice daily, clean water/soap washes twice daily and topical bacitracin or any antibacterial ointment will help.   Monitor for signs of worsening infection including increased swelling, pain, redness, pus, fevers.

## 2017-06-11 NOTE — ED Triage Notes (Signed)
Pt to ed for evaluation of red, raised skin lesion on the back of her neck. Denies pain there but says it itches. Notes bloody drainage prior. No drainage noted at this time, skin warm, non-tender. Pt also endorses red raised lesion on rue states as painful, non tender, non itching. Appeared yesterday.

## 2017-10-28 ENCOUNTER — Emergency Department (HOSPITAL_COMMUNITY): Payer: Self-pay

## 2017-10-28 ENCOUNTER — Emergency Department (HOSPITAL_COMMUNITY)
Admission: EM | Admit: 2017-10-28 | Discharge: 2017-10-28 | Disposition: A | Discharge status: Home / Self Care | Payer: Self-pay | Attending: Emergency Medicine | Admitting: Emergency Medicine

## 2017-10-28 ENCOUNTER — Other Ambulatory Visit: Payer: Self-pay

## 2017-10-28 ENCOUNTER — Encounter (HOSPITAL_COMMUNITY): Payer: Self-pay | Admitting: Emergency Medicine

## 2017-10-28 DIAGNOSIS — K5901 Slow transit constipation: Secondary | ICD-10-CM | POA: Insufficient documentation

## 2017-10-28 DIAGNOSIS — F17210 Nicotine dependence, cigarettes, uncomplicated: Secondary | ICD-10-CM | POA: Insufficient documentation

## 2017-10-28 DIAGNOSIS — N3000 Acute cystitis without hematuria: Secondary | ICD-10-CM | POA: Insufficient documentation

## 2017-10-28 DIAGNOSIS — M549 Dorsalgia, unspecified: Secondary | ICD-10-CM | POA: Insufficient documentation

## 2017-10-28 LAB — COMPREHENSIVE METABOLIC PANEL
ALT: 10 U/L — ABNORMAL LOW (ref 14–54)
AST: 16 U/L (ref 15–41)
Albumin: 3.3 g/dL — ABNORMAL LOW (ref 3.5–5.0)
Alkaline Phosphatase: 70 U/L (ref 38–126)
Anion gap: 12 (ref 5–15)
BUN: 5 mg/dL — ABNORMAL LOW (ref 6–20)
CO2: 24 mmol/L (ref 22–32)
Calcium: 8.9 mg/dL (ref 8.9–10.3)
Chloride: 100 mmol/L — ABNORMAL LOW (ref 101–111)
Creatinine, Ser: 0.8 mg/dL (ref 0.44–1.00)
GFR calc Af Amer: >60 mL/min (ref >60)
GFR calc non Af Amer: >60 mL/min (ref >60)
Glucose, Bld: 111 mg/dL — ABNORMAL HIGH (ref 65–99)
Potassium: 3.9 mmol/L (ref 3.5–5.1)
Sodium: 136 mmol/L (ref 135–145)
Total Bilirubin: 0.5 mg/dL (ref 0.3–1.2)
Total Protein: 7.7 g/dL (ref 6.5–8.1)

## 2017-10-28 LAB — URINALYSIS, ROUTINE W REFLEX MICROSCOPIC
Bilirubin Urine: NEGATIVE
Glucose, UA: NEGATIVE mg/dL
Ketones, ur: NEGATIVE mg/dL
Nitrite: POSITIVE — AB
Protein, ur: 30 mg/dL — AB
Specific Gravity, Urine: 1.013 (ref 1.005–1.030)
pH: 5 (ref 5.0–8.0)

## 2017-10-28 LAB — CBC
HCT: 40 % (ref 36.0–46.0)
Hemoglobin: 12.6 g/dL (ref 12.0–15.0)
MCH: 28.7 pg (ref 26.0–34.0)
MCHC: 31.5 g/dL (ref 30.0–36.0)
MCV: 91.1 fL (ref 78.0–100.0)
Platelets: 368 10*3/uL (ref 150–400)
RBC: 4.39 MIL/uL (ref 3.87–5.11)
RDW: 16.3 % — ABNORMAL HIGH (ref 11.5–15.5)
WBC: 10.6 10*3/uL — ABNORMAL HIGH (ref 4.0–10.5)

## 2017-10-28 LAB — LIPASE, BLOOD: Lipase: 22 U/L (ref 11–51)

## 2017-10-28 LAB — I-STAT BETA HCG BLOOD, ED (MC, WL, AP ONLY): I-stat hCG, quantitative: <5 m[IU]/mL (ref <5)

## 2017-10-28 MED ORDER — HYDROMORPHONE HCL 1 MG/ML IJ SOLN
1.0000 mg | Freq: Once | INTRAMUSCULAR | Status: AC
  Administered 2017-10-28: 1 mg via INTRAMUSCULAR
  Filled 2017-10-28: qty 1

## 2017-10-28 MED ORDER — CEPHALEXIN 500 MG PO CAPS: 500.0000 mg | ORAL_CAPSULE | Freq: Two times a day (BID) | ORAL | 0 refills | Status: DC

## 2017-10-28 MED ORDER — LACTULOSE 10 GM/15ML PO SOLN: 10.0000 g | Freq: Two times a day (BID) | ORAL | 0 refills | Status: DC
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
## 2017-10-28 NOTE — ED Triage Notes (Signed)
Pt c/o 10/10 abd pain and lower back pain, pt using suppository, laxative, castor oil and nothing is helping her to have a bowel movement. No fever or chills.

## 2018-03-20 ENCOUNTER — Inpatient Hospital Stay (HOSPITAL_COMMUNITY)
Admission: EM | Admit: 2018-03-20 | Discharge: 2018-05-10 | DRG: 219 | Disposition: A | Discharge status: Home / Self Care | Payer: Medicaid Other | Attending: Surgery | Admitting: Surgery

## 2018-03-20 ENCOUNTER — Emergency Department (HOSPITAL_COMMUNITY): Payer: Medicaid Other

## 2018-03-20 ENCOUNTER — Encounter (HOSPITAL_COMMUNITY): Payer: Self-pay | Admitting: Nurse Practitioner

## 2018-03-20 DIAGNOSIS — N760 Acute vaginitis: Secondary | ICD-10-CM | POA: Diagnosis present

## 2018-03-20 DIAGNOSIS — Z811 Family history of alcohol abuse and dependence: Secondary | ICD-10-CM

## 2018-03-20 DIAGNOSIS — E860 Dehydration: Secondary | ICD-10-CM | POA: Diagnosis present

## 2018-03-20 DIAGNOSIS — I339 Acute and subacute endocarditis, unspecified: Secondary | ICD-10-CM | POA: Diagnosis not present

## 2018-03-20 DIAGNOSIS — M25512 Pain in left shoulder: Secondary | ICD-10-CM | POA: Diagnosis not present

## 2018-03-20 DIAGNOSIS — K0530 Chronic periodontitis, unspecified: Secondary | ICD-10-CM | POA: Diagnosis not present

## 2018-03-20 DIAGNOSIS — R7881 Bacteremia: Secondary | ICD-10-CM | POA: Diagnosis present

## 2018-03-20 DIAGNOSIS — D62 Acute posthemorrhagic anemia: Secondary | ICD-10-CM | POA: Diagnosis not present

## 2018-03-20 DIAGNOSIS — T50905D Adverse effect of unspecified drugs, medicaments and biological substances, subsequent encounter: Secondary | ICD-10-CM | POA: Diagnosis not present

## 2018-03-20 DIAGNOSIS — Z23 Encounter for immunization: Secondary | ICD-10-CM

## 2018-03-20 DIAGNOSIS — F1190 Opioid use, unspecified, uncomplicated: Secondary | ICD-10-CM | POA: Diagnosis not present

## 2018-03-20 DIAGNOSIS — I079 Rheumatic tricuspid valve disease, unspecified: Secondary | ICD-10-CM | POA: Diagnosis not present

## 2018-03-20 DIAGNOSIS — B9562 Methicillin resistant Staphylococcus aureus infection as the cause of diseases classified elsewhere: Secondary | ICD-10-CM | POA: Diagnosis not present

## 2018-03-20 DIAGNOSIS — I368 Other nonrheumatic tricuspid valve disorders: Secondary | ICD-10-CM | POA: Diagnosis not present

## 2018-03-20 DIAGNOSIS — G479 Sleep disorder, unspecified: Secondary | ICD-10-CM | POA: Diagnosis present

## 2018-03-20 DIAGNOSIS — G8918 Other acute postprocedural pain: Secondary | ICD-10-CM | POA: Diagnosis not present

## 2018-03-20 DIAGNOSIS — I330 Acute and subacute infective endocarditis: Principal | ICD-10-CM | POA: Diagnosis present

## 2018-03-20 DIAGNOSIS — F1110 Opioid abuse, uncomplicated: Secondary | ICD-10-CM | POA: Diagnosis present

## 2018-03-20 DIAGNOSIS — I749 Embolism and thrombosis of unspecified artery: Secondary | ICD-10-CM | POA: Diagnosis not present

## 2018-03-20 DIAGNOSIS — N179 Acute kidney failure, unspecified: Secondary | ICD-10-CM | POA: Diagnosis not present

## 2018-03-20 DIAGNOSIS — N898 Other specified noninflammatory disorders of vagina: Secondary | ICD-10-CM | POA: Diagnosis not present

## 2018-03-20 DIAGNOSIS — L270 Generalized skin eruption due to drugs and medicaments taken internally: Secondary | ICD-10-CM | POA: Diagnosis present

## 2018-03-20 DIAGNOSIS — I38 Endocarditis, valve unspecified: Secondary | ICD-10-CM | POA: Diagnosis not present

## 2018-03-20 DIAGNOSIS — Z952 Presence of prosthetic heart valve: Secondary | ICD-10-CM | POA: Diagnosis not present

## 2018-03-20 DIAGNOSIS — I071 Rheumatic tricuspid insufficiency: Secondary | ICD-10-CM | POA: Diagnosis not present

## 2018-03-20 DIAGNOSIS — Z881 Allergy status to other antibiotic agents status: Secondary | ICD-10-CM | POA: Diagnosis not present

## 2018-03-20 DIAGNOSIS — N182 Chronic kidney disease, stage 2 (mild): Secondary | ICD-10-CM | POA: Diagnosis present

## 2018-03-20 DIAGNOSIS — B373 Candidiasis of vulva and vagina: Secondary | ICD-10-CM | POA: Diagnosis not present

## 2018-03-20 DIAGNOSIS — K089 Disorder of teeth and supporting structures, unspecified: Secondary | ICD-10-CM

## 2018-03-20 DIAGNOSIS — A599 Trichomoniasis, unspecified: Secondary | ICD-10-CM | POA: Diagnosis present

## 2018-03-20 DIAGNOSIS — B182 Chronic viral hepatitis C: Secondary | ICD-10-CM | POA: Diagnosis present

## 2018-03-20 DIAGNOSIS — F1910 Other psychoactive substance abuse, uncomplicated: Secondary | ICD-10-CM | POA: Diagnosis present

## 2018-03-20 DIAGNOSIS — T368X5A Adverse effect of other systemic antibiotics, initial encounter: Secondary | ICD-10-CM | POA: Diagnosis present

## 2018-03-20 DIAGNOSIS — T368X5D Adverse effect of other systemic antibiotics, subsequent encounter: Secondary | ICD-10-CM | POA: Diagnosis not present

## 2018-03-20 DIAGNOSIS — R110 Nausea: Secondary | ICD-10-CM | POA: Diagnosis not present

## 2018-03-20 DIAGNOSIS — Z954 Presence of other heart-valve replacement: Secondary | ICD-10-CM

## 2018-03-20 DIAGNOSIS — Z09 Encounter for follow-up examination after completed treatment for conditions other than malignant neoplasm: Secondary | ICD-10-CM

## 2018-03-20 DIAGNOSIS — R001 Bradycardia, unspecified: Secondary | ICD-10-CM | POA: Diagnosis not present

## 2018-03-20 DIAGNOSIS — I361 Nonrheumatic tricuspid (valve) insufficiency: Secondary | ICD-10-CM | POA: Diagnosis present

## 2018-03-20 DIAGNOSIS — R079 Chest pain, unspecified: Secondary | ICD-10-CM | POA: Diagnosis not present

## 2018-03-20 DIAGNOSIS — K029 Dental caries, unspecified: Secondary | ICD-10-CM | POA: Diagnosis not present

## 2018-03-20 DIAGNOSIS — Z8614 Personal history of Methicillin resistant Staphylococcus aureus infection: Secondary | ICD-10-CM

## 2018-03-20 DIAGNOSIS — K5900 Constipation, unspecified: Secondary | ICD-10-CM | POA: Diagnosis present

## 2018-03-20 DIAGNOSIS — R7989 Other specified abnormal findings of blood chemistry: Secondary | ICD-10-CM | POA: Diagnosis not present

## 2018-03-20 DIAGNOSIS — R768 Other specified abnormal immunological findings in serum: Secondary | ICD-10-CM

## 2018-03-20 DIAGNOSIS — F17210 Nicotine dependence, cigarettes, uncomplicated: Secondary | ICD-10-CM | POA: Diagnosis present

## 2018-03-20 DIAGNOSIS — I2690 Septic pulmonary embolism without acute cor pulmonale: Secondary | ICD-10-CM | POA: Diagnosis present

## 2018-03-20 DIAGNOSIS — B1920 Unspecified viral hepatitis C without hepatic coma: Secondary | ICD-10-CM | POA: Diagnosis not present

## 2018-03-20 DIAGNOSIS — F1990 Other psychoactive substance use, unspecified, uncomplicated: Secondary | ICD-10-CM | POA: Diagnosis not present

## 2018-03-20 DIAGNOSIS — I76 Septic arterial embolism: Secondary | ICD-10-CM | POA: Diagnosis not present

## 2018-03-20 DIAGNOSIS — M25519 Pain in unspecified shoulder: Secondary | ICD-10-CM | POA: Diagnosis not present

## 2018-03-20 DIAGNOSIS — N390 Urinary tract infection, site not specified: Secondary | ICD-10-CM | POA: Diagnosis not present

## 2018-03-20 DIAGNOSIS — F419 Anxiety disorder, unspecified: Secondary | ICD-10-CM | POA: Diagnosis present

## 2018-03-20 DIAGNOSIS — B999 Unspecified infectious disease: Secondary | ICD-10-CM

## 2018-03-20 DIAGNOSIS — R0989 Other specified symptoms and signs involving the circulatory and respiratory systems: Secondary | ICD-10-CM | POA: Diagnosis not present

## 2018-03-20 DIAGNOSIS — Z01810 Encounter for preprocedural cardiovascular examination: Secondary | ICD-10-CM | POA: Diagnosis not present

## 2018-03-20 LAB — COMPREHENSIVE METABOLIC PANEL
ALT: 55 U/L — ABNORMAL HIGH (ref 0–44)
AST: 71 U/L — ABNORMAL HIGH (ref 15–41)
Albumin: 2.2 g/dL — ABNORMAL LOW (ref 3.5–5.0)
Alkaline Phosphatase: 68 U/L (ref 38–126)
Anion gap: 9 (ref 5–15)
BUN: 19 mg/dL (ref 6–20)
CO2: 25 mmol/L (ref 22–32)
Calcium: 8.2 mg/dL — ABNORMAL LOW (ref 8.9–10.3)
Chloride: 106 mmol/L (ref 98–111)
Creatinine, Ser: 0.99 mg/dL (ref 0.44–1.00)
GFR calc Af Amer: >60 mL/min (ref >60)
GFR calc non Af Amer: >60 mL/min (ref >60)
Glucose, Bld: 99 mg/dL (ref 70–99)
Potassium: 4.3 mmol/L (ref 3.5–5.1)
Sodium: 140 mmol/L (ref 135–145)
Total Bilirubin: 0.4 mg/dL (ref 0.3–1.2)
Total Protein: 7.3 g/dL (ref 6.5–8.1)

## 2018-03-20 LAB — URINALYSIS, ROUTINE W REFLEX MICROSCOPIC
Bilirubin Urine: NEGATIVE
Glucose, UA: NEGATIVE mg/dL
Hgb urine dipstick: NEGATIVE
Ketones, ur: NEGATIVE mg/dL
Nitrite: NEGATIVE
Protein, ur: NEGATIVE mg/dL
Specific Gravity, Urine: 1.024 (ref 1.005–1.030)
pH: 5 (ref 5.0–8.0)

## 2018-03-20 LAB — CBC WITH DIFFERENTIAL/PLATELET
Basophils Absolute: 0 10*3/uL (ref 0.0–0.1)
Basophils Relative: 0 %
Eosinophils Absolute: 0.2 10*3/uL (ref 0.0–0.7)
Eosinophils Relative: 1 %
HCT: 29 % — ABNORMAL LOW (ref 36.0–46.0)
Hemoglobin: 9.6 g/dL — ABNORMAL LOW (ref 12.0–15.0)
Lymphocytes Relative: 17 %
Lymphs Abs: 3 10*3/uL (ref 0.7–4.0)
MCH: 30.7 pg (ref 26.0–34.0)
MCHC: 33.1 g/dL (ref 30.0–36.0)
MCV: 92.7 fL (ref 78.0–100.0)
Monocytes Absolute: 0.9 10*3/uL (ref 0.1–1.0)
Monocytes Relative: 5 %
Neutro Abs: 13.3 10*3/uL — ABNORMAL HIGH (ref 1.7–7.7)
Neutrophils Relative %: 77 %
Platelets: 621 10*3/uL — ABNORMAL HIGH (ref 150–400)
RBC: 3.13 MIL/uL — ABNORMAL LOW (ref 3.87–5.11)
RDW: 16.1 % — ABNORMAL HIGH (ref 11.5–15.5)
WBC: 17.4 10*3/uL — ABNORMAL HIGH (ref 4.0–10.5)

## 2018-03-20 LAB — PROTIME-INR
INR: 1.03
Prothrombin Time: 13.4 s (ref 11.4–15.2)

## 2018-03-20 LAB — I-STAT CG4 LACTIC ACID, ED: Lactic Acid, Venous: 1.37 mmol/L (ref 0.5–1.9)

## 2018-03-20 LAB — I-STAT BETA HCG BLOOD, ED (MC, WL, AP ONLY): I-stat hCG, quantitative: <5 m[IU]/mL

## 2018-03-20 MED ORDER — LORAZEPAM 1 MG PO TABS
1.00 | ORAL_TABLET | ORAL | Status: DC
Start: ? — End: 2018-03-20

## 2018-03-20 MED ORDER — GENERIC EXTERNAL MEDICATION
100.00 | Status: DC
Start: ? — End: 2018-03-20

## 2018-03-20 MED ORDER — CHLORDIAZEPOXIDE HCL 25 MG PO CAPS
25.00 | ORAL_CAPSULE | ORAL | Status: DC
Start: ? — End: 2018-03-20

## 2018-03-20 MED ORDER — BISACODYL 10 MG RE SUPP
10.00 | RECTAL | Status: DC
Start: ? — End: 2018-03-20

## 2018-03-20 MED ORDER — ONDANSETRON HCL 4 MG PO TABS: 4.0000 mg | ORAL_TABLET | Freq: Four times a day (QID) | ORAL | Status: DC | PRN

## 2018-03-20 MED ORDER — MAGNESIUM SULFATE 2 GM/50ML IV SOLN
2.00 | INTRAVENOUS | Status: DC
Start: ? — End: 2018-03-20

## 2018-03-20 MED ORDER — SODIUM CHLORIDE 0.9 % IV SOLN
1000.00 | INTRAVENOUS | Status: DC
Start: ? — End: 2018-03-20

## 2018-03-20 MED ORDER — NICOTINE 14 MG/24HR TD PT24
14.0000 mg | MEDICATED_PATCH | Freq: Every day | TRANSDERMAL | Status: DC
  Administered 2018-03-20 – 2018-03-28 (×9): 14 mg via TRANSDERMAL
  Filled 2018-03-20 (×10): qty 1

## 2018-03-20 MED ORDER — GENERIC EXTERNAL MEDICATION
15.00 | Status: DC
Start: ? — End: 2018-03-20

## 2018-03-20 MED ORDER — ACETAMINOPHEN 325 MG PO TABS
650.00 | ORAL_TABLET | ORAL | Status: DC
Start: ? — End: 2018-03-20

## 2018-03-20 MED ORDER — ACETAMINOPHEN 500 MG PO TABS
1000.00 | ORAL_TABLET | ORAL | Status: DC
Start: ? — End: 2018-03-20

## 2018-03-20 MED ORDER — LIDOCAINE-EPINEPHRINE 1 %-1:100000 IJ SOLN
INTRAMUSCULAR | Status: DC
Start: ? — End: 2018-03-20

## 2018-03-20 MED ORDER — LORAZEPAM 2 MG/ML IJ SOLN
2.00 | INTRAMUSCULAR | Status: DC
Start: ? — End: 2018-03-20

## 2018-03-20 MED ORDER — GENERIC EXTERNAL MEDICATION
1250.00 | Status: DC
Start: ? — End: 2018-03-20

## 2018-03-20 MED ORDER — DAPTOMYCIN 500 MG IV SOLR
8.0000 mg/kg | Freq: Every day | INTRAVENOUS | Status: DC
  Administered 2018-03-20: 544 mg via INTRAVENOUS
  Filled 2018-03-20: qty 10.88

## 2018-03-20 MED ORDER — MAGNESIUM SULFATE 4 GM/100ML IV SOLN
4.00 | INTRAVENOUS | Status: DC
Start: ? — End: 2018-03-20

## 2018-03-20 MED ORDER — DIPHENHYDRAMINE HCL 50 MG/ML IJ SOLN
25.0000 mg | Freq: Once | INTRAMUSCULAR | Status: AC
  Administered 2018-03-20: 25 mg via INTRAVENOUS
  Filled 2018-03-20: qty 1

## 2018-03-20 MED ORDER — MAGNESIUM HYDROXIDE 400 MG/5ML PO SUSP
30.00 | ORAL | Status: DC
Start: ? — End: 2018-03-20

## 2018-03-20 MED ORDER — DIPHENHYDRAMINE HCL 25 MG PO CAPS: 25.0000 mg | ORAL_CAPSULE | ORAL | Status: DC

## 2018-03-20 MED ORDER — GENERIC EXTERNAL MEDICATION
1000.00 | Status: DC
Start: ? — End: 2018-03-20

## 2018-03-20 MED ORDER — NICOTINE 14 MG/24HR TD PT24
14.00 | MEDICATED_PATCH | TRANSDERMAL | Status: DC
Start: ? — End: 2018-03-20

## 2018-03-20 MED ORDER — POTASSIUM CHLORIDE CRYS ER 20 MEQ PO TBCR
20.00 | EXTENDED_RELEASE_TABLET | ORAL | Status: DC
Start: ? — End: 2018-03-20

## 2018-03-20 MED ORDER — DAPTOMYCIN 500 MG IV SOLR
400.00 | INTRAVENOUS | Status: DC
Start: ? — End: 2018-03-20

## 2018-03-20 MED ORDER — LORAZEPAM 2 MG/ML IJ SOLN
1.00 | INTRAMUSCULAR | Status: DC
Start: ? — End: 2018-03-20

## 2018-03-20 MED ORDER — BISACODYL 5 MG PO TBEC
5.00 | DELAYED_RELEASE_TABLET | ORAL | Status: DC
Start: ? — End: 2018-03-20

## 2018-03-20 MED ORDER — GENERIC EXTERNAL MEDICATION
3.38 | Status: DC
Start: ? — End: 2018-03-20

## 2018-03-20 MED ORDER — PANTOPRAZOLE SODIUM 40 MG PO TBEC
40.0000 mg | DELAYED_RELEASE_TABLET | Freq: Every day | ORAL | Status: DC
  Administered 2018-03-21 – 2018-03-28 (×7): 40 mg via ORAL
  Filled 2018-03-20 (×8): qty 1

## 2018-03-20 MED ORDER — METHYLPREDNISOLONE SODIUM SUCC 40 MG IJ SOLR
40.00 | INTRAMUSCULAR | Status: DC
Start: ? — End: 2018-03-20

## 2018-03-20 MED ORDER — ENSURE ENLIVE PO LIQD: 237.0000 mL | Freq: Two times a day (BID) | ORAL | Status: DC

## 2018-03-20 MED ORDER — MUPIROCIN 2 % EX OINT
TOPICAL_OINTMENT | CUTANEOUS | Status: DC
Start: ? — End: 2018-03-20

## 2018-03-20 MED ORDER — SODIUM CHLORIDE 0.9 % IV SOLN
2000.00 | INTRAVENOUS | Status: DC
Start: ? — End: 2018-03-20

## 2018-03-20 MED ORDER — SALINE NASAL SPRAY 0.65 % NA SOLN
1.00 | NASAL | Status: DC
Start: ? — End: 2018-03-20

## 2018-03-20 MED ORDER — GENERIC EXTERNAL MEDICATION
90.00 | Status: DC
Start: ? — End: 2018-03-20

## 2018-03-20 MED ORDER — TRAMADOL HCL 50 MG PO TABS
50.00 | ORAL_TABLET | ORAL | Status: DC
Start: ? — End: 2018-03-20

## 2018-03-20 MED ORDER — SODIUM CHLORIDE 0.9 % IV SOLN
1950.00 | INTRAVENOUS | Status: DC
Start: ? — End: 2018-03-20

## 2018-03-20 MED ORDER — CLONIDINE HCL 0.1 MG PO TABS
0.10 | ORAL_TABLET | ORAL | Status: DC
Start: ? — End: 2018-03-20

## 2018-03-20 MED ORDER — SODIUM CHLORIDE 0.9 % IV SOLN
INTRAVENOUS | Status: DC
Start: ? — End: 2018-03-20

## 2018-03-20 MED ORDER — IPRATROPIUM-ALBUTEROL 0.5-2.5 (3) MG/3ML IN SOLN
3.00 | RESPIRATORY_TRACT | Status: DC
Start: ? — End: 2018-03-20

## 2018-03-20 MED ORDER — PANTOPRAZOLE SODIUM 40 MG PO TBEC
40.00 | DELAYED_RELEASE_TABLET | ORAL | Status: DC
Start: ? — End: 2018-03-20

## 2018-03-20 MED ORDER — GENERIC EXTERNAL MEDICATION
2000.00 | Status: DC
Start: ? — End: 2018-03-20

## 2018-03-20 MED ORDER — VANCOMYCIN HCL 10 G IV SOLR
1500.0000 mg | Freq: Once | INTRAVENOUS | Status: DC
  Filled 2018-03-20: qty 1500

## 2018-03-20 MED ORDER — ACETAMINOPHEN 325 MG PO TABS
650.0000 mg | ORAL_TABLET | Freq: Once | ORAL | Status: AC
  Administered 2018-03-20: 650 mg via ORAL
  Filled 2018-03-20: qty 2

## 2018-03-20 MED ORDER — METHYLPREDNISOLONE SODIUM SUCC 40 MG IJ SOLR
40.0000 mg | Freq: Every day | INTRAMUSCULAR | Status: DC
  Administered 2018-03-20 – 2018-03-21 (×2): 40 mg via INTRAVENOUS
  Filled 2018-03-20 (×2): qty 1

## 2018-03-20 MED ORDER — GENERIC EXTERNAL MEDICATION
4.50 | Status: DC
Start: ? — End: 2018-03-20

## 2018-03-20 MED ORDER — MORPHINE SULFATE (PF) 4 MG/ML IV SOLN
4.00 | INTRAVENOUS | Status: DC
Start: ? — End: 2018-03-20

## 2018-03-20 MED ORDER — METHYLPREDNISOLONE SODIUM SUCC 40 MG IJ SOLR: 40.0000 mg | INTRAMUSCULAR | Status: DC

## 2018-03-20 MED ORDER — METHADONE HCL 10 MG PO TABS
10.00 | ORAL_TABLET | ORAL | Status: DC
Start: ? — End: 2018-03-20

## 2018-03-20 MED ORDER — SODIUM CHLORIDE 0.9 % IJ SOLN
3.00 | INTRAMUSCULAR | Status: DC
Start: ? — End: 2018-03-20

## 2018-03-20 MED ORDER — METOPROLOL TARTRATE 5 MG/5ML IV SOLN
5.00 | INTRAVENOUS | Status: DC
Start: ? — End: 2018-03-20

## 2018-03-20 MED ORDER — GENERIC EXTERNAL MEDICATION
40.00 | Status: DC
Start: ? — End: 2018-03-20

## 2018-03-20 MED ORDER — HYDROCORTISONE 1 % EX CREA
TOPICAL_CREAM | CUTANEOUS | Status: DC
Start: ? — End: 2018-03-20

## 2018-03-20 MED ORDER — ONDANSETRON HCL 4 MG/2ML IJ SOLN
4.0000 mg | Freq: Four times a day (QID) | INTRAMUSCULAR | Status: DC | PRN
  Administered 2018-03-21 – 2018-03-26 (×4): 4 mg via INTRAVENOUS
  Filled 2018-03-20 (×4): qty 2

## 2018-03-20 MED ORDER — DEXTROMETHORPHAN-GUAIFENESIN 10-100 MG/5ML PO SYRP
10.00 | ORAL_SOLUTION | ORAL | Status: DC
Start: ? — End: 2018-03-20

## 2018-03-20 MED ORDER — TRAMADOL HCL 50 MG PO TABS
50.0000 mg | ORAL_TABLET | Freq: Four times a day (QID) | ORAL | Status: DC | PRN
  Administered 2018-03-20 – 2018-03-27 (×13): 50 mg via ORAL
  Filled 2018-03-20 (×13): qty 1

## 2018-03-20 MED ORDER — DIPHENHYDRAMINE HCL 25 MG PO CAPS
25.00 | ORAL_CAPSULE | ORAL | Status: DC
Start: ? — End: 2018-03-20

## 2018-03-20 MED ORDER — DIPHENHYDRAMINE HCL 25 MG PO CAPS
25.0000 mg | ORAL_CAPSULE | Freq: Four times a day (QID) | ORAL | Status: DC | PRN
  Administered 2018-03-21: 25 mg via ORAL
  Filled 2018-03-20: qty 1

## 2018-03-20 MED ORDER — ACETAMINOPHEN 325 MG PO TABS
650.0000 mg | ORAL_TABLET | Freq: Four times a day (QID) | ORAL | Status: DC | PRN
  Administered 2018-03-25 – 2018-03-26 (×3): 650 mg via ORAL
  Filled 2018-03-20 (×3): qty 2

## 2018-03-20 MED ORDER — CALCIUM GLUCONATE 10 % IV SOLN
2.00 | INTRAVENOUS | Status: DC
Start: ? — End: 2018-03-20

## 2018-03-20 MED ORDER — IOPAMIDOL (ISOVUE-300) INJECTION 61%
20.00 | INTRAVENOUS | Status: DC
Start: ? — End: 2018-03-20

## 2018-03-20 MED ORDER — METHADONE HCL 5 MG PO TABS
5.00 | ORAL_TABLET | ORAL | Status: DC
Start: ? — End: 2018-03-20

## 2018-03-20 MED ORDER — ACETAMINOPHEN 650 MG RE SUPP: 650.0000 mg | Freq: Four times a day (QID) | RECTAL | Status: DC | PRN

## 2018-03-20 MED ORDER — SODIUM CHLORIDE 0.9 % IV SOLN
500.00 | INTRAVENOUS | Status: DC
Start: ? — End: 2018-03-20

## 2018-03-20 MED ORDER — IOPAMIDOL (ISOVUE-300) INJECTION 61%
40.00 | INTRAVENOUS | Status: DC
Start: ? — End: 2018-03-20

## 2018-03-20 MED ORDER — SODIUM CHLORIDE 0.9 % IV SOLN
8.0000 mg/kg | Freq: Every day | INTRAVENOUS | Status: DC
  Filled 2018-03-20: qty 10.88

## 2018-03-20 NOTE — Progress Notes (Signed)
Pharmacy Antibiotic Note  Tiffany Salazar is a 37 y.o. female admitted on 03/20/2018 with endocarditis.  Pharmacy has been consulted for daptomycin dosing. [place]17 to 7-26 for tricuspid endocarditis, MRSA bacteremia and pulmonary septic emboli. On 7/14 she developed a rash that was thought to be caused by Vancomycin and was changed to Daptomycin at that time.  [facility]   Plan: Daptomycin 544 mg IV q24h (~ 8 mg/kg) ID will see in am F/u scr/cultures  Height: 5\' 7"  (170.2 cm) Weight: 150 lb (68 kg) IBW/kg (Calculated) : 61.6  Temp (24hrs), Avg:98.2 F (36.8 C), Min:98.2 F (36.8 C), Max:98.2 F (36.8 C)  Recent Labs  Lab 03/20/18 1828 03/20/18 1833  WBC 17.4*  --   CREATININE 0.99  --   LATICACIDVEN  --  1.37    Estimated Creatinine Clearance: 77.1 mL/min (by C-G formula based on SCr of 0.99 mg/dL).    Allergies  Allergen Reactions  . Gentamicin Rash  . Vancomycin Rash    Antimicrobials this admission: 7/27 daptomycin >>    >>   Dose adjustments this admission:   Microbiology results:  BCx:   UCx:    Sputum:    MRSA PCR:   Thank you for allowing pharmacy to be a part of this patient's care.  Tiffany Salazar, Tiffany Salazar 03/20/2018 9:21 PM
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
## 2018-03-20 NOTE — ED Notes (Signed)
Charge RN asked to attempt US IV per patient request.

## 2018-03-20 NOTE — ED Triage Notes (Signed)
Pt narrates that she was travelling from [place] and for some reason had to check in an a hospital in [place] where after vigorous testing, she was admitted for a couple days with "heart infection" and "fluid in the lungs." had a PICC line placed and was started on heavy doses of antibiotics, endorses Vancomycin but reports she "broke out in hives." Pt has generalized rash consistent with an allergic reaction, she reports that she left AMA after the hospital told her she would need to be admitted for six weeks to finish the course of antibitiocs, she requested to come home and seek readmission close to her family and friends. All these checks out/is collaborated by [facility] chart review under [facility] [place] [facility] on 03/17/2018. Although pt is tearful and reports fatigue after driving over 12 hours to get here, pt is only c/o arm pain where PICC line was removed and the discomfort from the rash. Endorses hx of IVDU but states last heroine use was about a week and half ago while in [place].

## 2018-03-20 NOTE — ED Notes (Signed)
Pt given a coke and a [place] sandwich.

## 2018-03-21 ENCOUNTER — Other Ambulatory Visit: Payer: Self-pay

## 2018-03-21 DIAGNOSIS — L270 Generalized skin eruption due to drugs and medicaments taken internally: Secondary | ICD-10-CM

## 2018-03-21 DIAGNOSIS — T368X5A Adverse effect of other systemic antibiotics, initial encounter: Secondary | ICD-10-CM

## 2018-03-21 DIAGNOSIS — B9562 Methicillin resistant Staphylococcus aureus infection as the cause of diseases classified elsewhere: Secondary | ICD-10-CM

## 2018-03-21 DIAGNOSIS — I749 Embolism and thrombosis of unspecified artery: Secondary | ICD-10-CM

## 2018-03-21 DIAGNOSIS — I76 Septic arterial embolism: Secondary | ICD-10-CM

## 2018-03-21 DIAGNOSIS — F17210 Nicotine dependence, cigarettes, uncomplicated: Secondary | ICD-10-CM

## 2018-03-21 DIAGNOSIS — Z881 Allergy status to other antibiotic agents status: Secondary | ICD-10-CM

## 2018-03-21 DIAGNOSIS — B1920 Unspecified viral hepatitis C without hepatic coma: Secondary | ICD-10-CM

## 2018-03-21 LAB — HIV ANTIBODY (ROUTINE TESTING W REFLEX): HIV Screen 4th Generation wRfx: NONREACTIVE

## 2018-03-21 MED ORDER — SODIUM CHLORIDE 0.9 % IV SOLN
550.0000 mg | Freq: Every day | INTRAVENOUS | Status: DC
  Administered 2018-03-21 – 2018-03-24 (×4): 550 mg via INTRAVENOUS
  Filled 2018-03-21 (×5): qty 11

## 2018-03-21 MED ORDER — NICOTINE 14 MG/24HR TD PT24
14.0000 mg | MEDICATED_PATCH | Freq: Once | TRANSDERMAL | Status: AC
  Administered 2018-03-21: 14 mg via TRANSDERMAL
  Filled 2018-03-21: qty 1

## 2018-03-21 MED ORDER — SULFAMETHOXAZOLE-TRIMETHOPRIM 800-160 MG PO TABS
1.0000 | ORAL_TABLET | Freq: Two times a day (BID) | ORAL | Status: AC
  Administered 2018-03-21 – 2018-04-19 (×59): 1 via ORAL
  Filled 2018-03-21 (×60): qty 1
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
## 2018-03-21 NOTE — Plan of Care (Signed)
VSS, patient medicated for pain in abdomen x 1 with improvement.  No other complaints.
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
## 2018-03-22 DIAGNOSIS — I330 Acute and subacute infective endocarditis: Secondary | ICD-10-CM

## 2018-03-22 DIAGNOSIS — R768 Other specified abnormal immunological findings in serum: Secondary | ICD-10-CM

## 2018-03-22 DIAGNOSIS — I079 Rheumatic tricuspid valve disease, unspecified: Secondary | ICD-10-CM

## 2018-03-22 LAB — CBC
HCT: 33.2 % — ABNORMAL LOW (ref 36.0–46.0)
Hemoglobin: 10.9 g/dL — ABNORMAL LOW (ref 12.0–15.0)
MCH: 29.9 pg (ref 26.0–34.0)
MCHC: 32.8 g/dL (ref 30.0–36.0)
MCV: 91 fL (ref 78.0–100.0)
Platelets: 663 10*3/uL — ABNORMAL HIGH (ref 150–400)
RBC: 3.65 MIL/uL — ABNORMAL LOW (ref 3.87–5.11)
RDW: 15.9 % — ABNORMAL HIGH (ref 11.5–15.5)
WBC: 15.9 10*3/uL — ABNORMAL HIGH (ref 4.0–10.5)

## 2018-03-22 LAB — COMPREHENSIVE METABOLIC PANEL
ALT: 73 U/L — ABNORMAL HIGH (ref 0–44)
AST: 52 U/L — ABNORMAL HIGH (ref 15–41)
Albumin: 2.6 g/dL — ABNORMAL LOW (ref 3.5–5.0)
Alkaline Phosphatase: 80 U/L (ref 38–126)
Anion gap: 10 (ref 5–15)
BUN: 23 mg/dL — ABNORMAL HIGH (ref 6–20)
CO2: 27 mmol/L (ref 22–32)
Calcium: 8.7 mg/dL — ABNORMAL LOW (ref 8.9–10.3)
Chloride: 103 mmol/L (ref 98–111)
Creatinine, Ser: 1.11 mg/dL — ABNORMAL HIGH (ref 0.44–1.00)
GFR calc Af Amer: >60 mL/min (ref >60)
GFR calc non Af Amer: >60 mL/min (ref >60)
Glucose, Bld: 117 mg/dL — ABNORMAL HIGH (ref 70–99)
Potassium: 4.3 mmol/L (ref 3.5–5.1)
Sodium: 140 mmol/L (ref 135–145)
Total Bilirubin: 0.6 mg/dL (ref 0.3–1.2)
Total Protein: 7.5 g/dL (ref 6.5–8.1)

## 2018-03-22 LAB — IRON AND TIBC
Iron: 94 ug/dL (ref 28–170)
Saturation Ratios: 35 % — ABNORMAL HIGH (ref 10.4–31.8)
TIBC: 271 ug/dL (ref 250–450)
UIBC: 177 ug/dL

## 2018-03-22 LAB — FERRITIN: Ferritin: 288 ng/mL (ref 11–307)

## 2018-03-22 LAB — MRSA PCR SCREENING: MRSA by PCR: NEGATIVE

## 2018-03-22 MED ORDER — GI COCKTAIL ~~LOC~~
30.0000 mL | Freq: Three times a day (TID) | ORAL | Status: DC | PRN
  Administered 2018-03-24 – 2018-03-25 (×2): 30 mL via ORAL
  Filled 2018-03-22 (×2): qty 30

## 2018-03-22 MED ORDER — BOOST / RESOURCE BREEZE PO LIQD CUSTOM
1.0000 | Freq: Two times a day (BID) | ORAL | Status: DC
  Administered 2018-03-22: 1 via ORAL

## 2018-03-22 MED ORDER — DICYCLOMINE HCL 10 MG PO CAPS
10.0000 mg | ORAL_CAPSULE | Freq: Three times a day (TID) | ORAL | Status: DC
  Administered 2018-03-22 – 2018-03-28 (×18): 10 mg via ORAL
  Filled 2018-03-22 (×19): qty 1

## 2018-03-22 MED ORDER — TRAZODONE HCL 50 MG PO TABS
50.0000 mg | ORAL_TABLET | Freq: Every evening | ORAL | Status: DC | PRN
  Administered 2018-03-22 – 2018-03-28 (×7): 50 mg via ORAL
  Filled 2018-03-22 (×7): qty 1

## 2018-03-22 MED ORDER — SODIUM CHLORIDE 0.9 % IV SOLN
INTRAVENOUS | Status: DC
  Administered 2018-03-22: 23:00:00 via INTRAVENOUS

## 2018-03-22 MED ORDER — BUPRENORPHINE HCL-NALOXONE HCL 8-2 MG SL SUBL
1.0000 | SUBLINGUAL_TABLET | Freq: Every day | SUBLINGUAL | Status: DC
  Administered 2018-03-22 – 2018-03-30 (×8): 1 via SUBLINGUAL
  Filled 2018-03-22 (×8): qty 1

## 2018-03-22 NOTE — Progress Notes (Signed)
Patient transfer to [place] @this  time via care link in no acute episode, accompanied by family.

## 2018-03-22 NOTE — Progress Notes (Signed)
Transfer coordination:  The family medicine residency team was called by Dr. Caleb PoppNettey and have agreed to assume care for this patient 7/30 at 7am assuming she has been [facility] by that time.  If there are any hold-ups in transfer the morning team will try and coordinate for a later handoff as medically appropriate.  -Dr. Parke SimmersBland

## 2018-03-22 NOTE — Progress Notes (Addendum)
INFECTIOUS DISEASE PROGRESS NOTE  ID: Tiffany Salazar is a 37 y.o. female with  Principal Problem:   Endocarditis of tricuspid valve Active Problems:   Polysubstance abuse (HCC)   IVDU (intravenous drug user)   MRSA bacteremia   Acute bacterial endocarditis  Subjective: Tearful.   Abtx:  Anti-infectives (From admission, onward)   Start     Dose/Rate Route Frequency Ordered Stop   03/21/18 2200  DAPTOmycin (CUBICIN) 550 mg in sodium chloride 0.9 % IVPB     550 mg 222 mL/hr over 30 Minutes Intravenous Daily at bedtime 03/21/18 1015     03/21/18 1400  sulfamethoxazole-trimethoprim (BACTRIM DS,SEPTRA DS) 800-160 MG per tablet 1 tablet     1 tablet Oral Every 12 hours 03/21/18 1319     03/20/18 2215  DAPTOmycin (CUBICIN) 544 mg in sodium chloride 0.9 % IVPB  Status:  Discontinued     8 mg/kg  68 kg 221.8 mL/hr over 30 Minutes Intravenous Daily at bedtime 03/20/18 2200 03/21/18 1015   03/20/18 2200  vancomycin (VANCOCIN) 1,500 mg in sodium chloride 0.9 % 500 mL IVPB  Status:  Discontinued     1,500 mg 250 mL/hr over 120 Minutes Intravenous  Once 03/20/18 2156 03/20/18 2203   03/20/18 2130  DAPTOmycin (CUBICIN) 544 mg in sodium chloride 0.9 % IVPB  Status:  Discontinued     8 mg/kg  68 kg 221.8 mL/hr over 30 Minutes Intravenous Daily at bedtime 03/20/18 2125 03/20/18 2151      Medications:  Scheduled: . buprenorphine-naloxone  1 tablet Sublingual Daily  . dicyclomine  10 mg Oral TID AC  . feeding supplement  1 Container Oral BID BM  . nicotine  14 mg Transdermal Daily  . nicotine  14 mg Transdermal Once  . pantoprazole  40 mg Oral Daily  . sulfamethoxazole-trimethoprim  1 tablet Oral Q12H    Objective: Vital signs in last 24 hours: Temp:  [97.7 F (36.5 C)-98.7 F (37.1 C)] 98.2 F (36.8 C) (07/29 1337) Pulse Rate:  [67-76] 71 (07/29 1337) Resp:  [16-18] 16 (07/29 1337) BP: (106-116)/(76-87) 115/87 (07/29 1337) SpO2:  [99 %-100 %] 99 % (07/29 1337)   General  appearance: alert, cooperative and no distress Resp: clear to auscultation bilaterally Cardio: regular rate and rhythm GI: normal findings: bowel sounds normal and soft, non-tender Extremities: track marks L foot.   Lab Results Recent Labs    03/20/18 1828 03/22/18 1346  WBC 17.4* 15.9*  HGB 9.6* 10.9*  HCT 29.0* 33.2*  NA 140 140  K 4.3 4.3  CL 106 103  CO2 25 27  BUN 19 23*  CREATININE 0.99 1.11*   Liver Panel Recent Labs    03/20/18 1828 03/22/18 1346  PROT 7.3 7.5  ALBUMIN 2.2* 2.6*  AST 71* 52*  ALT 55* 73*  ALKPHOS 68 80  BILITOT 0.4 0.6   Sedimentation Rate No results for input(s): ESRSEDRATE in the last 72 hours. C-Reactive Protein No results for input(s): CRP in the last 72 hours.  Microbiology: Recent Results (from the past 240 hour(s))  Culture, blood (Routine x 2)     Status: None (Preliminary result)   Collection Time: 03/20/18  6:30 PM  Result Value Ref Range Status   Specimen Description   Final    BLOOD LEFT ARM Performed at [facility], [address] [place], [place] [id number]    Special Requests   Final    BOTTLES DRAWN AEROBIC AND ANAEROBIC Blood Culture adequate  volume Performed at [facility], [address] [place], [place] [address]    Culture   Final    NO GROWTH 1 DAY Performed at [facility], [address], [place], [place] [address]    Report Status PENDING  Incomplete  Culture, blood (Routine x 2)     Status: None (Preliminary result)   Collection Time: 03/21/18  5:51 AM  Result Value Ref Range Status   Specimen Description   Final    BLOOD RIGHT HAND Performed at [facility], [address] [place], [place] [address]    Special Requests   Final    BOTTLES DRAWN AEROBIC ONLY Blood Culture adequate volume Performed at [facility], [address], [place], [place] [address]    Culture   Final    NO GROWTH 1 DAY Performed at [facility], [address] [place], [place] [address]    Report Status PENDING  Incomplete    Studies/Results: Dg Chest 2 View  Result Date: 03/20/2018 CLINICAL DATA:  Chest pain EXAM: CHEST - 2 VIEW COMPARISON:  10/28/2017 chest radiograph. FINDINGS: Stable cardiomediastinal silhouette with normal heart size. No pneumothorax. No pleural effusion. There are numerous new focal patchy nodular opacities throughout both lungs, most of which demonstrate cavitary change. IMPRESSION: Numerous new focal patchy nodular opacities throughout both lungs, most of which demonstrate cavitary change, very likely to represent septic emboli in this patient with a history of IV drug abuse. Follow-up chest imaging recommended to document resolution. Chest CT with IV contrast may be obtained for further characterization. Electronically Signed   By: Delbert Phenix M.D.   On: 03/20/2018 17:54     Assessment/Plan: TV IE Septic pulmonary emboli Polysubs abuse Hep C vanco adr  Total days of antibiotics: 2 dapto/bactrim  Await TEE Check her Hep C studies HIV (-) Repeat BCx is ngtd  Watch drug rash She has prev been at [facility] She has prev been on methadone.  I've contacted [facility] with [facility] stop as well as [facility].  She needs safe landing from her hospitalization. I tried to convince her of importance of substitution therapy.   Addendum- Will give her name/number of Les Q at [facility] who supervises substitution programs [phone] ext [phone]        Johny Sax MD, FACP Infectious Diseases (pager) [phone] [email or web address] 03/22/2018, 3:45 PM  LOS: 2 days

## 2018-03-22 NOTE — Progress Notes (Signed)
    [facility] has been requested to perform a transesophageal echocardiogram on Tiffany Salazar for endocarditis and MRSA bacteremia.  After careful review of history and examination, the risks and benefits of transesophageal echocardiogram have been explained including risks of esophageal damage, perforation (1:10,000 risk), bleeding, pharyngeal hematoma as well as other potential complications associated with conscious sedation including aspiration, arrhythmia, respiratory failure and death. Alternatives to treatment were discussed, questions were answered. Patient is willing to proceed.   Georgie ChardJill Tena Linebaugh, NP  03/22/2018 1:24 PM

## 2018-03-22 NOTE — Progress Notes (Signed)
[facility] in the morning and to be put on family medicine practice. Report given to [place]. [facility] called and transportation has been set up.

## 2018-03-22 NOTE — Progress Notes (Addendum)
PROGRESS NOTE    Tiffany Salazar  ZOX:[id number] DOB: 07/07/1981 DOA: 03/20/2018 PCP: Cory Munch Team, MD   Brief Narrative: Tiffany Salazar is a 37 y.o. female with a history of female with a history of IVDU, polysubstance abuse. She presented from a [place] hospital for which she was admitted for the treatment of TV endocarditis and MRSA bacteremia with associated septic pulmonary emboli. Patient left AMA to return to [facility] for continued treatment.   Assessment & Plan:   Principal Problem:   Endocarditis of tricuspid valve Active Problems:   Polysubstance abuse (HCC)   IVDU (intravenous drug user)   MRSA bacteremia   TV endocarditis Tricuspid valve with RV vegetation and septic emboli to lungs. Secondary to IV drug use and MRSA bacteremia. -Infectious disease recommendations: Daptomycin and Bactrim. Plan for 6 weeks of treatment. -Cardiology consult in AM  Pulmonary septic emboli Secondary to endocarditis/bacteremia. Patient is s/p thoracenteses for parapneumonic effusions. On room air. Chest x-ray on admission without evidence of pleural effusions. -Management above  MRSA bacteremia -Management above -Repeat blood cultures (7/28) pending  IV drug use Polysubstance abuse Per chart review of prior hospitalization, it appears patient was found with an unidentified white powder in addition to syringes in her room. She was placed in a room with a Recruitment consultant. Discussed with nursing supervisor at OSH and confirmed she did have a sitter that was later discontinued. Some withdrawal symptoms. -COWS -Start Suboxone 8-2 mg daily  Rash Maculopapular. Thought to be secondary to IV antibiotics. Treated with steroids and benadryl. Improved. -Discontinue Solu-medrol -Continue benadryl prn  Anemia Unknown etiology. Normocytic. ?blood loss. Nothing currently. -Repeat CBC -Iron/TIBC/Ferritin   DVT prophylaxis: SCDs Code Status:   Code Status: Full Code Family Communication: None  at bedside Disposition Plan: Discharge pending completed antibiotic course   Consultants:   Infectious disease  Cardiology  Procedures:   7/17: Outside Hospital Transthoracic Echocardiogram  Summary:  -Clinical question: Sepsis  -Left ventricle: Size was normal. Systolic function was normal. Ejection  fraction was estimated in the range of 55 % to 60 %. There were no regional  wall motion abnormalities. Left ventricular diastolic function parameters were  normal.  -Mitral valve: There was no evidence for vegetation.  -Right ventricle: The size was normal. Systolic function was normal.  Systolic pressure was within the normal range. Estimated peak pressure was at  least 24 mmHg. There was a mobile mass in the ventricular cavity. It may  represent a vegetation.  -Tricuspid valve: There was trace regurgitation. There was echogenic mobile  mass on tricuspid valve leaflets and also in the right ventricular chamber  consistent with vegetations  Clinical question: Sepsis  Procedure: The procedure was performed at the bedside. This was a routine  study.  Left ventricle: Size was normal. Systolic function was normal. Ejection  fraction was estimated in the range of 55 % to 60 %. There were no regional  wall motion abnormalities. Wall thickness was normal. Doppler: Left  ventricular diastolic function parameters were normal.  Aortic valve: The valve was trileaflet. Leaflets exhibited normal thickness  and normal cuspal separation. Doppler: There was no stenosis. There was no  significant aortic valve regurgitation.  Aorta: The root exhibited normal size.  Mitral valve: Valve structure was normal. There was normal leaflet separation.  There was no evidence for vegetation. Doppler: There was no evidence for  stenosis. There was no significant regurgitation.  Left atrium: Size was normal.  Pulmonary veins: Not well visualized.  Right  ventricle: The size was  normal. Systolic function was normal. Wall  thickness was normal. There was a mobile mass in the ventricular cavity. It  may represent a vegetation. Doppler: Systolic pressure was within the normal  range. Estimated peak pressure was at least 24 mmHg.  Pulmonic valve: Not well visualized.  Tricuspid valve: The valve structure was normal. There was normal leaflet  separation. There was echogenic mobile mass on tricuspid valve leaflets and  also in the right ventricular chamber consistent with vegetations . Doppler:  There was no evidence for tricuspid stenosis. There was trace regurgitation.  Right atrium: Size was normal.  Systemic veins: IVC: The inferior vena cava was normal in size and course.  Respirophasic changes were normal.  Pericardium: There was no pericardial effusion.  Antimicrobials: Outside Hospitalization  Gentamycin   Vancomycin  Zosyn  Cefepime  Daptomycin  Current Hospitalization  Daptomycin (7/28>>  Bactrim (7/28>>   Subjective: No itching. Having some abdominal cramping.  Objective: Vitals:   03/21/18 0432 03/21/18 1319 03/21/18 2127 03/22/18 0605  BP: 117/86 114/81 116/78 106/76  Pulse: 78 72 76 67  Resp: 16 (!) 22 18 18   Temp: 98.1 F (36.7 C) 98.2 F (36.8 C) 98.7 F (37.1 C) 97.7 F (36.5 C)  TempSrc: Oral Oral Oral Oral  SpO2: 99% 98% 99% 100%  Weight:      Height:        Intake/Output Summary (Last 24 hours) at 03/22/2018 1143 Last data filed at 03/22/2018 0900 Gross per 24 hour  Intake 1071 ml  Output -  Net 1071 ml   Filed Weights   03/20/18 2119  Weight: 68 kg (150 lb)    Examination:  General exam: Appears calm and comfortable Respiratory system: Bibasilar rales. Respiratory effort normal. Cardiovascular system: S1 & S2 heard, RRR. No murmurs Gastrointestinal system: Abdomen is nondistended, soft and nontender. No organomegaly or masses felt. Normal bowel sounds heard. Central nervous system: Alert and oriented.  No focal neurological deficits. Extremities: No edema. No calf tenderness Skin: No cyanosis. Maculopapular rash on trunk Psychiatry: Judgement and insight appear normal. Mood & affect appropriate.     Data Reviewed: I have personally reviewed following labs and imaging studies  CBC: Recent Labs  Lab 03/20/18 1828  WBC 17.4*  NEUTROABS 13.3*  HGB 9.6*  HCT 29.0*  MCV 92.7  PLT 621*   Basic Metabolic Panel: Recent Labs  Lab 03/20/18 1828  NA 140  K 4.3  CL 106  CO2 25  GLUCOSE 99  BUN 19  CREATININE 0.99  CALCIUM 8.2*   GFR: Estimated Creatinine Clearance: 77.1 mL/min (by C-G formula based on SCr of 0.99 mg/dL). Liver Function Tests: Recent Labs  Lab 03/20/18 1828  AST 71*  ALT 55*  ALKPHOS 68  BILITOT 0.4  PROT 7.3  ALBUMIN 2.2*   No results for input(s): LIPASE, AMYLASE in the last 168 hours. No results for input(s): AMMONIA in the last 168 hours. Coagulation Profile: Recent Labs  Lab 03/20/18 1828  INR 1.03   Cardiac Enzymes: No results for input(s): CKTOTAL, CKMB, CKMBINDEX, TROPONINI in the last 168 hours. BNP (last 3 results) No results for input(s): PROBNP in the last 8760 hours. HbA1C: No results for input(s): HGBA1C in the last 72 hours. CBG: No results for input(s): GLUCAP in the last 168 hours. Lipid Profile: No results for input(s): CHOL, HDL, LDLCALC, TRIG, CHOLHDL, LDLDIRECT in the last 72 hours. Thyroid Function Tests: No results for input(s): TSH, T4TOTAL, FREET4, T3FREE, THYROIDAB  in the last 72 hours. Anemia Panel: No results for input(s): VITAMINB12, FOLATE, FERRITIN, TIBC, IRON, RETICCTPCT in the last 72 hours. Sepsis Labs: Recent Labs  Lab 03/20/18 1833  LATICACIDVEN 1.37    No results found for this or any previous visit (from the past 240 hour(s)).       Radiology Studies: Dg Chest 2 View  Result Date: 03/20/2018 CLINICAL DATA:  Chest pain EXAM: CHEST - 2 VIEW COMPARISON:  10/28/2017 chest radiograph. FINDINGS:  Stable cardiomediastinal silhouette with normal heart size. No pneumothorax. No pleural effusion. There are numerous new focal patchy nodular opacities throughout both lungs, most of which demonstrate cavitary change. IMPRESSION: Numerous new focal patchy nodular opacities throughout both lungs, most of which demonstrate cavitary change, very likely to represent septic emboli in this patient with a history of IV drug abuse. Follow-up chest imaging recommended to document resolution. Chest CT with IV contrast may be obtained for further characterization. Electronically Signed   By: Delbert PhenixJason A Poff M.D.   On: 03/20/2018 17:54        Scheduled Meds: . buprenorphine-naloxone  1 tablet Sublingual Daily  . dicyclomine  10 mg Oral TID AC  . feeding supplement  1 Container Oral BID BM  . nicotine  14 mg Transdermal Daily  . nicotine  14 mg Transdermal Once  . pantoprazole  40 mg Oral Daily  . sulfamethoxazole-trimethoprim  1 tablet Oral Q12H   Continuous Infusions: . DAPTOmycin (CUBICIN)  IV Stopped (03/21/18 2152)     LOS: 2 days     Tiffany Hawkingalph Cerissa Zeiger, MD [facility] 03/22/2018, 11:43 AM Pager: [phone]  If 7PM-7AM, please contact night-coverage [email or web address] 03/22/2018, 11:43 AM

## 2018-03-22 NOTE — Progress Notes (Signed)
Initial Nutrition Assessment  DOCUMENTATION CODES:   Not applicable  INTERVENTION:  - Will d/c Ensure Enlive per patient request/preference.  - Will trial order of Boost Breeze BID, each supplement provides 250 kcal and 9 grams of protein. - Continue to encourage PO intakes.   NUTRITION DIAGNOSIS:   Inadequate oral intake related to decreased appetite, acute illness as evidenced by per patient/family report.  GOAL:   Patient will meet greater than or equal to 90% of their needs  MONITOR:   PO intake, Supplement acceptance, Weight trends, Labs  REASON FOR ASSESSMENT:   Malnutrition Screening Tool  ASSESSMENT:   37 y.o. female with a history of female with a history of IVDU, polysubstance abuse. She presented from [place] hospital for which she was admitted for the treatment of TV endocarditis and MRSA bacteremia with associated septic pulmonary emboli. [facility].  BMI indicates normal weight. Per chart review, patient consumed 100% of breakfast yesterday (930 kcal, 18 grams of protein) and 50% of breakfast this AM (437 kcal, 7 grams of protein). Patient reports that over the past 1-2 weeks she has had decreased appetite d/t not feeling well. She states with [place] back from [place] she did not eat more than a few bites for a day. She is unable to provide a UBW or amount of weight lost or time frame for weight loss. Per chart review, [facility]. Based on current weight, this indicates 4 lb weight loss (2.6% body weight) in the past 5 days; this is significant for time frame but unsure if any of this was fluid related [facility] and mentioned thoracentesis.   Per Dr. Dennison NancyNettey's note yesterday afternoon: tricuspid valve with vegetation and septic emboli in lungs 2/2 IVDU and MRSA, Cardiology to be consulted in the AM (today), s/p thoracentesis(unable to determine amount of fluid aspirated).     Medications reviewed; 40 mg oral Protonix/day.  Labs reviewed from 7/27 evening; Ca: 8.2 mg/dL, LFTs elevated.      NUTRITION - FOCUSED PHYSICAL EXAM:  Completed; no muscle and no fat wasting, no edema at this time.   Diet Order:   Diet Order           Diet regular Room service appropriate? Yes; Fluid consistency: Thin  Diet effective now          EDUCATION NEEDS:   No education needs have been identified at this time  Skin:  Skin Assessment: Reviewed RN Assessment  Last BM:  7/29  Height:   Ht Readings from Last 1 Encounters:  03/20/18 5\' 7"  (1.702 m)    Weight:   Wt Readings from Last 1 Encounters:  03/20/18 150 lb (68 kg)    Ideal Body Weight:  61.36 kg  BMI:  Body mass index is 23.49 kg/m.  Estimated Nutritional Needs:   Kcal:  1700-1905 (25-28 kcal/kg)  Protein:  75-85 grams  Fluid:  >/= 2 L/day     Trenton GammonJessica Nasean Zapf, MS, RD, LDN, [facility] Inpatient Clinical Dietitian Pager # [phone] After hours/weekend pager # [phone]
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
## 2018-03-23 ENCOUNTER — Encounter (HOSPITAL_COMMUNITY): Payer: Self-pay | Admitting: Anesthesiology

## 2018-03-23 ENCOUNTER — Inpatient Hospital Stay (HOSPITAL_COMMUNITY): Payer: Medicaid Other | Admitting: Anesthesiology

## 2018-03-23 ENCOUNTER — Inpatient Hospital Stay (HOSPITAL_COMMUNITY): Payer: Medicaid Other

## 2018-03-23 ENCOUNTER — Encounter (HOSPITAL_COMMUNITY)
Admission: EM | Disposition: A | Discharge status: Home / Self Care | Payer: Self-pay | Source: Home / Self Care | Attending: Surgery

## 2018-03-23 DIAGNOSIS — T368X5D Adverse effect of other systemic antibiotics, subsequent encounter: Secondary | ICD-10-CM

## 2018-03-23 DIAGNOSIS — I38 Endocarditis, valve unspecified: Secondary | ICD-10-CM

## 2018-03-23 DIAGNOSIS — I361 Nonrheumatic tricuspid (valve) insufficiency: Secondary | ICD-10-CM

## 2018-03-23 HISTORY — PX: TEE WITHOUT CARDIOVERSION: SHX5443

## 2018-03-23 LAB — BASIC METABOLIC PANEL WITH GFR
Anion gap: 12 (ref 5–15)
BUN: 24 mg/dL — ABNORMAL HIGH (ref 6–20)
CO2: 25 mmol/L (ref 22–32)
Calcium: 8.3 mg/dL — ABNORMAL LOW (ref 8.9–10.3)
Chloride: 100 mmol/L (ref 98–111)
Creatinine, Ser: 1.14 mg/dL — ABNORMAL HIGH (ref 0.44–1.00)
GFR calc Af Amer: >60 mL/min
GFR calc non Af Amer: >60 mL/min
Glucose, Bld: 85 mg/dL (ref 70–99)
Potassium: 4.7 mmol/L (ref 3.5–5.1)
Sodium: 137 mmol/L (ref 135–145)

## 2018-03-23 LAB — CBC
HCT: 33.7 % — ABNORMAL LOW (ref 36.0–46.0)
Hemoglobin: 10.8 g/dL — ABNORMAL LOW (ref 12.0–15.0)
MCH: 29.8 pg (ref 26.0–34.0)
MCHC: 32 g/dL (ref 30.0–36.0)
MCV: 92.8 fL (ref 78.0–100.0)
Platelets: 614 K/uL — ABNORMAL HIGH (ref 150–400)
RBC: 3.63 MIL/uL — ABNORMAL LOW (ref 3.87–5.11)
RDW: 15.6 % — ABNORMAL HIGH (ref 11.5–15.5)
WBC: 19.5 K/uL — ABNORMAL HIGH (ref 4.0–10.5)

## 2018-03-23 LAB — PREGNANCY, URINE: Preg Test, Ur: NEGATIVE

## 2018-03-23 SURGERY — ECHOCARDIOGRAM, TRANSESOPHAGEAL
Anesthesia: Monitor Anesthesia Care

## 2018-03-23 MED ORDER — POLYETHYLENE GLYCOL 3350 17 G PO PACK
17.0000 g | PACK | Freq: Every day | ORAL | Status: DC
  Administered 2018-03-24 – 2018-03-28 (×4): 17 g via ORAL
  Filled 2018-03-23 (×5): qty 1

## 2018-03-23 MED ORDER — LACTATED RINGERS IV SOLN
INTRAVENOUS | Status: DC | PRN
  Administered 2018-03-23: 13:00:00 via INTRAVENOUS

## 2018-03-23 MED ORDER — PHENYLEPHRINE HCL 10 MG/ML IJ SOLN
INTRAMUSCULAR | Status: DC | PRN
  Administered 2018-03-23: 100 ug via INTRAVENOUS

## 2018-03-23 MED ORDER — PROPOFOL 500 MG/50ML IV EMUL
INTRAVENOUS | Status: DC | PRN
  Administered 2018-03-23: 100 ug/kg/min via INTRAVENOUS

## 2018-03-23 MED ORDER — PROPOFOL 10 MG/ML IV BOLUS
INTRAVENOUS | Status: DC | PRN
  Administered 2018-03-23 (×2): 20 mg via INTRAVENOUS

## 2018-03-23 MED ORDER — LACTATED RINGERS IV SOLN
INTRAVENOUS | Status: DC
  Administered 2018-03-23: 14:00:00 via INTRAVENOUS

## 2018-03-23 MED ORDER — PROPOFOL 500 MG/50ML IV EMUL: INTRAVENOUS | Status: DC | PRN

## 2018-03-23 MED ORDER — SENNA 8.6 MG PO TABS
1.0000 | ORAL_TABLET | Freq: Every day | ORAL | Status: DC
  Administered 2018-03-23 – 2018-03-25 (×3): 8.6 mg via ORAL
  Filled 2018-03-23 (×3): qty 1

## 2018-03-23 NOTE — Progress Notes (Signed)
Patient admitted to room 2c15. Friend accompanied patient.  Alert& oriented x4 denies pain or discomfort at this time.  Patient expressed that she had some skin peeling from reaction to medication given while at another location.

## 2018-03-23 NOTE — Progress Notes (Signed)
[facility]   Reason for visit: Follow up on endocarditis  Interval History: no new complaints, WBC 19.5, scheduled for TEE today; remains afebrile.    Physical Exam: Constitutional:  Vitals:   03/23/18 1132 03/23/18 1336  BP: 106/69 (!) 100/49  Pulse: 91 97  Resp: (!) 21 13  Temp: 98.6 F (37 C)   SpO2:  98%   patient appears in NAD Eyes: anicteric HENT: no thrush Respiratory: Normal respiratory effort; CTA B Cardiovascular: RRR GI: soft, nt, nd  Review of Systems: Constitutional: negative for fevers and chills Gastrointestinal: negative for nausea and diarrhea Musculoskeletal: negative for myalgias and arthralgias  Lab Results  Component Value Date   WBC 19.5 (H) 03/23/2018   HGB 10.8 (L) 03/23/2018   HCT 33.7 (L) 03/23/2018   MCV 92.8 03/23/2018   PLT 614 (H) 03/23/2018    Lab Results  Component Value Date   CREATININE 1.14 (H) 03/23/2018   BUN 24 (H) 03/23/2018   NA 137 03/23/2018   K 4.7 03/23/2018   CL 100 03/23/2018   CO2 25 03/23/2018    Lab Results  Component Value Date   ALT 73 (H) 03/22/2018   AST 52 (H) 03/22/2018   ALKPHOS 80 03/22/2018     Microbiology: Recent Results (from the past 240 hour(s))  Culture, blood (Routine x 2)     Status: None (Preliminary result)   Collection Time: 03/20/18  6:30 PM  Result Value Ref Range Status   Specimen Description   Final    BLOOD LEFT ARM Performed at [facility], [address] [place], [place] [address]    Special Requests   Final    BOTTLES DRAWN AEROBIC AND ANAEROBIC Blood Culture adequate volume Performed at [facility], [address] [place], [place] [address]    Culture   Final    NO GROWTH 1 DAY Performed at [facility], [address] [place], [place] [address]    Report Status PENDING  Incomplete  Culture, blood (Routine x 2)     Status: None (Preliminary result)   Collection Time: 03/21/18  5:51 AM    Result Value Ref Range Status   Specimen Description   Final    BLOOD RIGHT HAND Performed at [facility], [address] [place], [place] [address]    Special Requests   Final    BOTTLES DRAWN AEROBIC ONLY Blood Culture adequate volume Performed at [facility], [address] [place], [place] [address]    Culture   Final    NO GROWTH 1 DAY Performed at [facility], [address] [place], [place] [address]    Report Status PENDING  Incomplete  MRSA PCR Screening     Status: None   Collection Time: 03/22/18  5:52 PM  Result Value Ref Range Status   MRSA by PCR NEGATIVE NEGATIVE Final    Comment:        The GeneXpert MRSA Assay (FDA approved for NASAL specimens only), is one component of a comprehensive MRSA colonization surveillance program. It is not intended to diagnose MRSA infection nor to guide or monitor treatment for MRSA infections. Performed at [facility], [address] [place], [place] [address]     Impression/Plan:  1. Endocarditis - TEE today.  Will need prolonged course of daptomycin for 6 weeks.   2.  Allergy - rash to vancomcycin + gent.  Now off. Rash slowly resolving.   3.  Substance abuse - will need continued efforts at remaining drug-free.  Dr. Ninetta Lights has provided her with information regarding post hospital care.

## 2018-03-23 NOTE — Anesthesia Preprocedure Evaluation (Addendum)
Anesthesia Evaluation  Patient identified by MRN, date of birth, ID band Patient awake    Reviewed: Allergy & Precautions, NPO status , Patient's Chart, lab work & pertinent test results  Airway Mallampati: II  TM Distance: >3 FB Neck ROM: Full    Dental  (+)    Pulmonary Current Smoker,    Pulmonary exam normal breath sounds clear to auscultation       Cardiovascular negative cardio ROS Normal cardiovascular exam Rhythm:Regular Rate:Normal  ECG: NSR, rate 74   Neuro/Psych Subdural hematoma  negative psych ROS   GI/Hepatic negative GI ROS, (+)     substance abuse  , Hepatitis -, C  Endo/Other  negative endocrine ROS  Renal/GU negative Renal ROS     Musculoskeletal negative musculoskeletal ROS (+)   Abdominal   Peds  Hematology  (+) anemia ,   Anesthesia Other Findings ENDOCARDITIS  Reproductive/Obstetrics hcg negative                            Anesthesia Physical Anesthesia Plan  ASA: III  Anesthesia Plan: MAC   Post-op Pain Management:    Induction: Intravenous  PONV Risk Score and Plan: 1 and Propofol infusion and Treatment may vary due to age or medical condition  Airway Management Planned: Nasal Cannula  Additional Equipment:   Intra-op Plan:   Post-operative Plan:   Informed Consent: I have reviewed the patients History and Physical, chart, labs and discussed the procedure including the risks, benefits and alternatives for the proposed anesthesia with the patient or authorized representative who has indicated his/her understanding and acceptance.   Dental advisory given  Plan Discussed with: CRNA  Anesthesia Plan Comments:         Anesthesia Quick Evaluation

## 2018-03-23 NOTE — CV Procedure (Signed)
TRANSESOPHAGEAL ECHOCARDIOGRAM (TEE) NOTE  INDICATIONS: infective endocarditis  PROCEDURE:   Informed consent was obtained prior to the procedure. The risks, benefits and alternatives for the procedure were discussed and the patient comprehended these risks.  Risks include, but are not limited to, cough, sore throat, vomiting, nausea, somnolence, esophageal and stomach trauma or perforation, bleeding, low blood pressure, aspiration, pneumonia, infection, trauma to the teeth and death.    After a procedural time-out, the patient was given propofol per anesthesia for sedation.  The patient's heart rate, blood pressure, and oxygen saturation are monitored continuously during the procedure.The oropharynx was anesthetized 10 cc of topical 1% viscous lidocaine.  The transesophageal probe was inserted in the esophagus and stomach without difficulty and multiple views were obtained.  The patient was kept under observation until the patient left the procedure room.  The patient left the procedure room in stable condition.   Agitated microbubble saline contrast was not administered.  COMPLICATIONS:    There were no immediate complications.  Findings:  1. LEFT VENTRICLE: The left ventricular wall thickness is normal.  The left ventricular cavity is normal in size. Wall motion is normal.  LVEF is 55-60%.  2. RIGHT VENTRICLE:  The right ventricle is normal in structure and function without any thrombus or masses.    3. LEFT ATRIUM:  The left atrium is normal in size without any thrombus or masses.  There is not spontaneous echo contrast ("smoke") in the left atrium consistent with a low flow state.  4. LEFT ATRIAL APPENDAGE:  The small left atrial appendage is free of any thrombus or masses. The appendage has single lobes. Pulse doppler indicates high flow in the appendage.  5. ATRIAL SEPTUM:  The atrial septum appears intact and is free of thrombus and/or masses.  There is no evidence for  interatrial shunting by color doppler.  6. RIGHT ATRIUM:  The right atrium is normal in size and function without any thrombus or masses.  7. MITRAL VALVE:  The mitral valve is normal in structure and function with trivial regurgitation.  There were no vegetations or stenosis.  8. AORTIC VALVE:  The aortic valve is trileaflet, normal in structure and function with no regurgitation.  There were no vegetations or stenosis  9. TRICUSPID VALVE:  The tricuspid valve demonstrates multiple vegetations on the posterior and septal leaflets measuring about 0.5 x 1 cm and seen prolapsing in and out of the TV valve plane. There is "Wide" open, severe regurgitation regurgitation.  10.  PULMONIC VALVE:  The pulmonic valve is normal in structure and function with trivial regurgitation.  There were no vegetations or stenosis.   11. AORTIC ARCH, ASCENDING AND DESCENDING AORTA:  There was grade 1 Myrtis Ser(Katz et. Al, 1992) atherosclerosis of the ascending aorta, aortic arch, or proximal descending aorta.  12. PULMONARY VEINS: Anomalous pulmonary venous return was not noted.  13. PERICARDIUM: The pericardium appeared normal and non-thickened.  There is no pericardial effusion.  IMPRESSION:   1. Multiple TV vegetations with severe, wide-open tricuspid regurgitation. 2. No LAA thrombus 3. Negative for PFO by color doppler 4. LVEF 55-60%  RECOMMENDATIONS:    1. Recommend surgical evaluation for large (>1 cm) TV vegetations, history of embolization, high risk features for repeat embolization and severe associated TR.  Time Spent Directly with the Patient:  45 minutes   Chrystie NoseKenneth C. Niquan Charnley, MD, [facility], FACP  [place]  [facility]  Medical Director of the Advanced Lipid Disorders &  Cardiovascular Risk Reduction  Clinic Diplomate of the American Board of Clinical Lipidology Attending Cardiologist  Direct Dial: [phone]  Fax: [phone]  Website:  [email or web address]  Lisette Abu Keenan Dimitrov 03/23/2018, 2:21  PM

## 2018-03-23 NOTE — Care Management Note (Signed)
Case Management Note  Patient Details  Name: Tiffany Salazar MRN: [id number] Date of Birth: 09/03/1980  Subjective/Objective:   Pt admitted with endocarditis.                   Action/Plan:  PTA from home.  IVDA.  Pt will need 6 weeks of IV antibiotics.  CM will continue to follow    Expected Discharge Date:                  Expected Discharge Plan:  Home/Self Care  In-House Referral:  Clinical Social Work  Discharge planning Services  CM Consult  Post Acute Care Choice:    Choice offered to:     DME Arranged:    DME Agency:     HH Arranged:    HH Agency:     Status of Service:     If discussed at [employer] Meetings, dates discussed:    Additional Comments:  Cherylann ParrClaxton, Jeralynn Vaquera S, RN 03/23/2018, 4:12 PM

## 2018-03-23 NOTE — Progress Notes (Signed)
Family Medicine Teaching Service Daily Progress Note Intern Pager: [phone]  Patient name: Tiffany Salazar Medical record number: [id number] Date of birth: 11/25/1980 Age: 37 y.o. Gender: female  Primary Care Provider: Cory Munched, [facility], MD Consultants: cardiology, ID Code Status: full  Pt Overview and Major Events to Date:  7/17-26 In [place] hosp to endocarditis/MRSA bacteremia/PE/hemoptosis.  Reports neg reaction to vanc/zosyn and thoracentesis from PE. 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 Secondary to IV drug use and MRSA bacteremia.  Adverse reaction to vanc/zosyn. -accept transfer from [facility], Dr. Lum BabeEniola attending -ID consulted: -cardiology consulted -cont Daptomycin (7/27- 9/7?) 6wks per ID -cont Bactrim (7/27- 9/7?) 6wks per ID  -Cardiology to perform TEE 7/30 (NPO until then) -tylenol prn for fever  Pulmonary septic emboli, stable respiration on RA Secondary to endocarditis/bacteremia. Patient is s/p thoracenteses for parapneumonic effusions. Chest x-ray on admission without evidence of pleural effusions. -Management above  MRSA bacteremia -Management above -Repeat blood cultures (7/28) pending  IV drug use Polysubstance abuse, patient expresses desire to stay sober.  COWS score 11>10>0>1>2 Per chart review of prior hospitalization, it appears patient was found with an unidentified white powder in addition to syringes in her room.  Withdrawl symptoms feeling improved on suboxone, plans to move to [place] with FOB to avoid "wrong crowd" upon discharge. -COWS -cont Suboxone 8-2 mg daily -nicotine path 14mg  -tylenol prn for pain  Rash Maculopapular. Thought to be secondary to IV antibiotics. Treated with steroids and  benadryl. Improved. -Discontinue Solu-medrol -Continue benadryl prn  Anemia Unknown etiology. Normocytic. ?blood loss. Nothing currently. -Repeat CBC -Iron/TIBC/Ferritin  Sleep disorder: patient still complaining of not getting good rest, blames it on "coming off all that stuff" -cont trazadone 50mg  prn  FEN/GI: NPO pending TEE, (malox prn, zofran prn,dycylomine TID, boost, gi cocktail,  PPx: SCDs (recent hx of hemoptosis while at outside hospital, will need to determine timeframe of chemical prophylaxis)  Disposition: inpatient until likely 9/7 for supervised IV abx  Subjective:  Had a pleasant conversation with patient.  She understands the basic clinical course so far and agrees with the plan including the TEE.  Her long term sobriety plan includes moving to [place] with her sober FOB to avoid the "wrong people".  No current pain complaints, has been having BM every 2 days without discomfort, no respiratory complaints currently.  Feels withdrawls are managed appropriately for now.  Objective: Temp:  [97.7 F (36.5 C)-98.9 F (37.2 C)] 98.7 F (37.1 C) (07/30 0711) Pulse Rate:  [71-89] 85 (07/30 0711) Resp:  [16-26] 26 (07/30 0711) BP: (97-115)/(55-87) 103/70 (07/30 0711) SpO2:  [95 %-99 %] 95 % (07/30 0711) Physical Exam: General: thin, NAD, pleasant Cardiovascular: RRR, 2/6 mumur Respiratory: CTAB, no IWB on room air, no wheeze/crackles Abdomen: no pain, soft Extremities: no pitting edema, sat up without assistance, all limbs moving without deficit noted  Laboratory: Recent Labs  Lab 03/20/18 1828 03/22/18 1346  WBC 17.4* 15.9*  HGB 9.6* 10.9*  HCT 29.0* 33.2*  PLT 621* 663*   Recent Labs  Lab 03/20/18 1828 03/22/18 1346  NA 140 140  K 4.3 4.3  CL 106 103  CO2 25 27  BUN 19 23*  CREATININE 0.99 1.11*  CALCIUM 8.2* 8.7*  PROT 7.3 7.5  BILITOT 0.4 0.6  ALKPHOS 68 80  ALT 55* 73*  AST 71* 52*  GLUCOSE 99 117*    Imaging/Diagnostic Tests: Dg Chest 2  View  Result Date: 03/20/2018 CLINICAL DATA:  Chest pain EXAM: CHEST - 2 VIEW COMPARISON:  10/28/2017 chest radiograph. FINDINGS: Stable cardiomediastinal silhouette with normal heart size. No pneumothorax. No pleural effusion. There are numerous new focal patchy nodular opacities throughout both lungs, most of which demonstrate cavitary change. IMPRESSION: Numerous new focal patchy nodular opacities throughout both lungs, most of which demonstrate cavitary change, very likely to represent septic emboli in this patient with a history of IV drug abuse. Follow-up chest imaging recommended to document resolution. Chest CT with IV contrast may be obtained for further characterization. Electronically Signed   By: Delbert Phenix M.D.   On: 03/20/2018 17:54     Marthenia Rolling, DO 03/23/2018, 7:47 AM PGY-2, [place] [facility] FPTS Intern pager: [phone], text pages welcome

## 2018-03-23 NOTE — H&P (Signed)
   INTERVAL PROCEDURE H&P  History and Physical Interval Note:  03/23/2018 1:46 PM  [name] has presented today for their planned procedure. The various methods of treatment have been discussed with the patient and family. After consideration of risks, benefits and other options for treatment, the patient has consented to the procedure.  The patients' outpatient history has been reviewed, patient examined, and no change in status from most recent office note within the past 30 days. I have reviewed the patients' chart and labs and will proceed as planned. Questions were answered to the patient's satisfaction.   Chrystie NoseKenneth C. Raelynne Ludwick, MD, [facility], FACP    [facility]  Medical Director of the Advanced Lipid Disorders &  Cardiovascular Risk Reduction Clinic Diplomate of the American Board of Clinical Lipidology Attending Cardiologist  Direct Dial: [phone]  Fax: [phone]  Website:  [email or web address]  Alfreida Steffenhagen C Audreana Hancox 03/23/2018, 1:46 PM

## 2018-03-23 NOTE — Clinical Social Work Note (Signed)
Discussed case with social work team lead. Highly unlikely we would be able to find placement for IV antibiotics due to IVDA with picc, Daptomycin, and Suboxone. Will continue to follow.  Charlynn CourtSarah Nycholas Rayner, CSW [phone]

## 2018-03-23 NOTE — Transfer of Care (Signed)
Immediate Anesthesia Transfer of Care Note  Patient: Tiffany Salazar  Procedure(s) Performed: TRANSESOPHAGEAL ECHOCARDIOGRAM (TEE) (N/A )  Patient Location: PACU and Endoscopy Unit  Anesthesia Type:General  Level of Consciousness: awake  Airway & Oxygen Therapy: Patient Spontanous Breathing and Patient connected to nasal cannula oxygen  Post-op Assessment: Report given to RN  Post vital signs: Reviewed and stable  Last Vitals:  Vitals Value Taken Time  BP 55/26 03/23/2018  2:29 PM  Temp    Pulse 84 03/23/2018  2:30 PM  Resp 16 03/23/2018  2:30 PM  SpO2 97 % 03/23/2018  2:30 PM  Vitals shown include unvalidated device data.  Last Pain:  Vitals:   03/23/18 1336  TempSrc: Oral  PainSc:       Patients Stated Pain Goal: 1 (03/22/18 0919)  Complications: No apparent anesthesia complications

## 2018-03-23 NOTE — Progress Notes (Signed)
Progress Note  Patient Name: Tiffany Salazar [name] Date of Encounter: 03/23/2018  Primary Cardiologist: No primary care provider on file.   Subjective   Feeling frustrated and tired.  No chest pain or shortness of breath.  Inpatient Medications    Scheduled Meds: . buprenorphine-naloxone  1 tablet Sublingual Daily  . dicyclomine  10 mg Oral TID AC  . feeding supplement  1 Container Oral BID BM  . nicotine  14 mg Transdermal Daily  . pantoprazole  40 mg Oral Daily  . sulfamethoxazole-trimethoprim  1 tablet Oral Q12H   Continuous Infusions: . sodium chloride 20 mL/hr at 03/23/18 0630  . DAPTOmycin (CUBICIN)  IV Stopped (03/22/18 2327)   PRN Meds: acetaminophen **OR** acetaminophen, diphenhydrAMINE, gi cocktail, ondansetron **OR** ondansetron (ZOFRAN) IV, traMADol, traZODone   Vital Signs    Vitals:   03/22/18 2101 03/22/18 2300 03/23/18 0300 03/23/18 0711  BP: 108/76 104/77 (!) 97/55 103/70  Pulse:  79 89 85  Resp:  19 20 (!) 26  Temp:  98 F (36.7 C) 98.9 F (37.2 C) 98.7 F (37.1 C)  TempSrc:  Oral Oral Oral  SpO2:  98% 96% 95%  Weight:      Height:        Intake/Output Summary (Last 24 hours) at 03/23/2018 0935 Last data filed at 03/23/2018 0630 Gross per 24 hour  Intake 850.21 ml  Output -  Net 850.21 ml   Filed Weights   03/20/18 2119  Weight: 150 lb (68 kg)    Telemetry    Sinus rhythm, sinus tachycardia.  No events - Personally Reviewed  ECG    03/23/18: Sinus rhythm rate 74 bpm.  - Personally Reviewed  Physical Exam   VS:  BP 103/70 (BP Location: Right Arm)   Pulse 85   Temp 98.7 F (37.1 C) (Oral)   Resp (!) 26   Ht 5\' 7"  (1.702 m)   Wt 150 lb (68 kg)   LMP 03/15/2018   SpO2 95%   BMI 23.49 kg/m  , BMI Body mass index is 23.49 kg/m. GENERAL:  Chronically ill-appearing HEENT: Pupils equal round and reactive, fundi not visualized, oral mucosa unremarkable NECK:  No jugular venous distention, waveform within normal limits, carotid  upstroke brisk and symmetric, no bruits LUNGS:  Clear to auscultation bilaterally HEART:  RRR.  PMI not displaced or sustained,S1 and S2 within normal limits, no S3, no S4, no clicks, no rubs, no murmurs ABD:  Flat, positive bowel sounds normal in frequency in pitch, no bruits, no rebound, no guarding, no midline pulsatile mass, no hepatomegaly, no splenomegaly EXT:  2 plus pulses throughout, no edema, no cyanosis no clubbing SKIN:  Diffuse desquamation NEURO:  Cranial nerves II through XII grossly intact, motor grossly intact throughout [facility]:  Cognitively intact, oriented to person place and time   Labs    Chemistry Recent Labs  Lab 03/20/18 1828 03/22/18 1346  NA 140 140  K 4.3 4.3  CL 106 103  CO2 25 27  GLUCOSE 99 117*  BUN 19 23*  CREATININE 0.99 1.11*  CALCIUM 8.2* 8.7*  PROT 7.3 7.5  ALBUMIN 2.2* 2.6*  AST 71* 52*  ALT 55* 73*  ALKPHOS 68 80  BILITOT 0.4 0.6  GFRNONAA >60 >60  GFRAA >60 >60  ANIONGAP 9 10     Hematology Recent Labs  Lab 03/20/18 1828 03/22/18 1346 03/23/18 0852  WBC 17.4* 15.9* 19.5*  RBC 3.13* 3.65* 3.63*  HGB 9.6* 10.9* 10.8*  HCT 29.0*  33.2* 33.7*  MCV 92.7 91.0 92.8  MCH 30.7 29.9 29.8  MCHC 33.1 32.8 32.0  RDW 16.1* 15.9* 15.6*  PLT 621* 663* 614*    Cardiac EnzymesNo results for input(s): TROPONINI in the last 168 hours. No results for input(s): TROPIPOC in the last 168 hours.   BNPNo results for input(s): BNP, PROBNP in the last 168 hours.   DDimer No results for input(s): DDIMER in the last 168 hours.   Radiology    No results found.  Cardiac Studies   TEE Pending  ECHO: 03/10/2018: Summary:  -Clinical question: Sepsis  -Left ventricle: Size was normal. Systolic function was normal. Ejection  fraction was estimated in the range of 55 % to 60 %. There were no regional  wall motion abnormalities. Left ventricular diastolic function parameters were normal.  -Mitral valve: There was no evidence for  vegetation.  -Right ventricle: The size was normal. Systolic function was normal.  Systolic pressure was within the normal range. Estimated peak pressure was at  least 24 mmHg. There was a mobile mass in the ventricular cavity. It may  represent a vegetation.  -Tricuspid valve: There was trace regurgitation. There was echogenic mobile  mass on tricuspid valve leaflets and also in the right ventricular chamber  consistent with vegetations .  Pulmonary veins: Not well visualized.  Right ventricle: The size was normal. Systolic function was normal. Wall  thickness was normal. There was a mobile mass in the ventricular cavity. It  may represent a vegetation. Doppler: Systolic pressure was within the normal  range. Estimated peak pressure was at least 24 mmHg.  Pulmonic valve: Not well visualized.  Tricuspid valve: The valve structure was normal. There was normal leaflet  separation. There was echogenic mobile mass on tricuspid valve leaflets and  also in the right ventricular chamber consistent with vegetations . Doppler:  There was no evidence for tricuspid stenosis. There was trace regurgitation.  Right atrium: Size was normal.  Systemic veins: IVC: The inferior vena cava was normal in size and course.  Respirophasic changes were normal.    Patient Profile     37 y.o. female with a history of IV drug abuse here with MRSA tricuspid valve endocarditis and septic pulmonary emboli.  Assessment & Plan    # Tricuspid valve endocarditis: # Septic pulmonary emboli: Outside hospital blood cultures were positive for MRSA.  She was noted to have a vegetation on her tricuspid valve and of the right atrium.  She also has septic pulmonary emboli.  TEE is pending today.  She developed an allergic reaction to vancomycin and gentamicin and is currently on daptomycin and Bactrim.  Blood cultures remain no growth to date and nasal swab was negative for MRSA.  Plan is currently for at least 6  weeks of supervised outpatient IV antibiotics.  She currently has no clinical evidence of heart failure and EKG is without conduction abnormalities.  # IVDA: # Polysubstance abuse:  Patient eager to quit.  Started suboxone this hospitalization and she has a nicotine patch.      For questions or updates, please contact [facility] Please consult [email or web address] for contact info under Cardiology/STEMI.      Signed, Chilton Si, MD  03/23/2018, 9:35 AM

## 2018-03-23 NOTE — Anesthesia Procedure Notes (Signed)
Procedure Name: MAC Date/Time: 03/23/2018 1:50 PM Performed by: Eligha Bridegroom, CRNA Pre-anesthesia Checklist: Patient identified, Emergency Drugs available, Suction available, Patient being monitored and Timeout performed Patient Re-evaluated:Patient Re-evaluated prior to induction Oxygen Delivery Method: Nasal cannula Preoxygenation: Pre-oxygenation with 100% oxygen Induction Type: IV induction

## 2018-03-23 NOTE — Progress Notes (Signed)
  Echocardiogram Echocardiogram Transesophageal has been performed.  Delcie RochENNINGTON, Kemar Pandit 03/23/2018, 2:45 PM

## 2018-03-24 ENCOUNTER — Inpatient Hospital Stay (HOSPITAL_COMMUNITY): Payer: Medicaid Other

## 2018-03-24 ENCOUNTER — Encounter (HOSPITAL_COMMUNITY): Payer: Self-pay | Admitting: Dentistry

## 2018-03-24 ENCOUNTER — Other Ambulatory Visit: Payer: Self-pay | Admitting: *Deleted

## 2018-03-24 DIAGNOSIS — I339 Acute and subacute endocarditis, unspecified: Secondary | ICD-10-CM

## 2018-03-24 DIAGNOSIS — Z01810 Encounter for preprocedural cardiovascular examination: Secondary | ICD-10-CM

## 2018-03-24 DIAGNOSIS — F1910 Other psychoactive substance abuse, uncomplicated: Secondary | ICD-10-CM

## 2018-03-24 DIAGNOSIS — I361 Nonrheumatic tricuspid (valve) insufficiency: Secondary | ICD-10-CM

## 2018-03-24 DIAGNOSIS — I2690 Septic pulmonary embolism without acute cor pulmonale: Secondary | ICD-10-CM

## 2018-03-24 DIAGNOSIS — R7881 Bacteremia: Secondary | ICD-10-CM

## 2018-03-24 DIAGNOSIS — I071 Rheumatic tricuspid insufficiency: Secondary | ICD-10-CM

## 2018-03-24 DIAGNOSIS — M25519 Pain in unspecified shoulder: Secondary | ICD-10-CM

## 2018-03-24 DIAGNOSIS — F1990 Other psychoactive substance use, unspecified, uncomplicated: Secondary | ICD-10-CM

## 2018-03-24 DIAGNOSIS — I368 Other nonrheumatic tricuspid valve disorders: Secondary | ICD-10-CM

## 2018-03-24 DIAGNOSIS — R768 Other specified abnormal immunological findings in serum: Secondary | ICD-10-CM

## 2018-03-24 LAB — BASIC METABOLIC PANEL
Anion gap: 9 (ref 5–15)
BUN: 17 mg/dL (ref 6–20)
CO2: 26 mmol/L (ref 22–32)
Calcium: 8.2 mg/dL — ABNORMAL LOW (ref 8.9–10.3)
Chloride: 100 mmol/L (ref 98–111)
Creatinine, Ser: 1.13 mg/dL — ABNORMAL HIGH (ref 0.44–1.00)
GFR calc Af Amer: >60 mL/min (ref >60)
GFR calc non Af Amer: >60 mL/min (ref >60)
Glucose, Bld: 90 mg/dL (ref 70–99)
Potassium: 5.2 mmol/L — ABNORMAL HIGH (ref 3.5–5.1)
Sodium: 135 mmol/L (ref 135–145)

## 2018-03-24 LAB — CBC
HCT: 31.4 % — ABNORMAL LOW (ref 36.0–46.0)
Hemoglobin: 10.2 g/dL — ABNORMAL LOW (ref 12.0–15.0)
MCH: 30.2 pg (ref 26.0–34.0)
MCHC: 32.5 g/dL (ref 30.0–36.0)
MCV: 92.9 fL (ref 78.0–100.0)
Platelets: 557 10*3/uL — ABNORMAL HIGH (ref 150–400)
RBC: 3.38 MIL/uL — ABNORMAL LOW (ref 3.87–5.11)
RDW: 15.4 % (ref 11.5–15.5)
WBC: 20.3 10*3/uL — ABNORMAL HIGH (ref 4.0–10.5)

## 2018-03-24 MED ORDER — ADULT MULTIVITAMIN W/MINERALS CH
1.0000 | ORAL_TABLET | Freq: Every day | ORAL | Status: DC
  Administered 2018-03-24 – 2018-03-28 (×5): 1 via ORAL
  Filled 2018-03-24 (×5): qty 1

## 2018-03-24 MED ORDER — IOPAMIDOL (ISOVUE-370) INJECTION 76%
100.0000 mL | Freq: Once | INTRAVENOUS | Status: AC | PRN
  Administered 2018-03-24: 100 mL via INTRAVENOUS

## 2018-03-24 MED ORDER — IOPAMIDOL (ISOVUE-370) INJECTION 76%
INTRAVENOUS | Status: AC
  Filled 2018-03-24: qty 100

## 2018-03-24 NOTE — Progress Notes (Signed)
Family Medicine Teaching Service Daily Progress Note Intern Pager: [phone]  Patient name: Tiffany Salazar Medical record number: [id number] Date of birth: [birth year] Age: 37 y.o. Gender: female  Primary Care Provider: Cory Munch Team, MD Consultants: cardiology, ID, CVTS Code Status: full  Pt Overview and Major Events to Date:  7/17-26 In [place] hosp to endocarditis/MRSA bacteremia/PE/hemoptosis.  Reports neg reaction to vanc/zosyn and thoracentesis from PE. 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 Secondary to IV drug use and MRSA bacteremia.  Adverse reaction to vanc/zosyn. Echo showed multiple TV vegetations with severe, wide-open tricuspid regurg, high thrombic potential, no LAA thrombus, negative for PFO, LVEF 55-60%. Cardiology recommended surgical consult. Spoke with CVTS, ordering CT chest to evaluate for pulmonary abscess, consult Dental for orthopanogram, and tentatively take to OR next Monday for valve replacement.  -ID consulted  -cont Daptomycin (7/27- 9/7?) 6wks  -cont Bactrim (7/27- 9/7?) 6wks -cardiology consulted -tylenol prn for fever -f/u CVTS recs TV replacement 8/5 -f/u dental  -Orthopantogram/panoramic  -dental does not recommend surgery at this time -CT chest  - Multiple bilateral septic emboli.  Small pleural effusions.  - No pulmonary embolus. -will restart IVF as needed  Pulmonary septic emboli: Stable on RA Secondary to endocarditis/bacteremia. Patient is s/p thoracenteses for parapneumonic effusions. Chest x-ray evidence of minimal bilateral pleural effusions.  -Management with antibiotics above  MRSA bacteremia -Management above -Repeat blood cultures (7/28) showed NGTD  IV drug use/Polysubstance abuse -cont Suboxone 8-2 mg  daily -nicotine patch 14mg  -tylenol prn for pain -tramadol 50mg  q6 PRN for pain  Rash: Improved Maculopapular. Thought to be secondary to IV antibiotics. Treated with steroids and benadryl.  -Continuebenadryl prn  Anemia: Stable Normocytic, Hgb 10.8 this AM. -Repeat CBC -Iron/TIBC/Ferritin  Leukocytosis  WBC reduced to 15.5 this AM. Afebrile. -cont to monitor with CBC -cont antibiotics  Sleep disorder: Stable Sleeping well on trazodone. -cont trazadone 50mg  prn  FEN/GI: Heart Healthy  PPx: SCDs (recent hx of hemoptosis while at outside hospital, will need to determine timeframe of chemical prophylaxis)   Disposition: stable. continued inpatient until likely 9/7 for supervised IV abx   Subjective:  Patient states she is doing well this morning.  Complains of continuous interruptions from blood draw and medical staff.  Patient complains of left shoulder pain that began yesterday and has been constant worsened by deep breaths.  States she has had this pain before which is caused by bowel gas.  Patient admits to no bowel movements for 5 days.  Is not passing gas.  She states this is normal for her to go up to a month without bowel movement.  She was hospitalized a few months ago for small bowel obstruction.  She states obstruction resolved with medication.  She is requesting increased medication for constipation.  I recommended her to increase her activity level and prescribe a suppository.   Objective: Blood pressure 100/77, pulse (!) 103, temperature 98.7 F (37.1 C), temperature source Oral, resp. rate 15, height 5\' 7"  (1.702 m), weight 148 lb (67.1 kg), last menstrual period 03/15/2018, SpO2 94 %, unknown if currently breastfeeding.    Physical Exam: General: Appears well in no acute distress or pain Cardiovascular: Regular rate and rhythm, murmur auscultated. Respiratory: CBTA Extremities: No swelling in lower extremities.  Left shoulder not  tender to palpation.  No  bruising or deformities noted.  Full range of motion.  Back: Macular erythematous rash  Laboratory: Sodium 132 Potassium 4.7 Chloride 97 Creatinine 1.11 Calcium 8.6  Imaging/Diagnostic Tests:  2 view chest xray 7/27 Numerous new focal patchy nodular opacities throughout both lungs, most of which demonstrate cavitary change, very likely to represent septic emboli in this patient with a history of IV drug abuse. Follow-up chest imaging recommended to document resolution. Chest CT with IV contrast may be obtained for further characterization.  Orthopantogram/panoramic 03/24/2018 Results: Equivocal lucency/periapical abscess along the lateral root of tooth #3  CT angio chest 7/31 1. Multiple bilateral septic emboli.  Small pleural effusions. 2. No pulmonary embolus.  Leeroy BockAnderson, Chelsey L, DO PGY-1, [facility] FPTS Intern pager: [phone], text pages welcome

## 2018-03-24 NOTE — Progress Notes (Signed)
Nutrition Follow-up  DOCUMENTATION CODES:   Not applicable  INTERVENTION:   -D/c Boost Breeze po BID, each supplement provides 250 kcal and 9 grams of protein -MVI with minerals daily -Magic Cup BID with meals, each supplement provides 290 kcals and 9 grams protein  NUTRITION DIAGNOSIS:   Inadequate oral intake related to decreased appetite, acute illness as evidenced by per patient/family report.  Progressing  GOAL:   Patient will meet greater than or equal to 90% of their needs  Progressing  MONITOR:   PO intake, Supplement acceptance, Weight trends, Labs  REASON FOR ASSESSMENT:   Malnutrition Screening Tool    ASSESSMENT:   37 y.o. female with a history of female with a history of IVDU, polysubstance abuse. [facility] [facility] she [facility] admitted for the treatment of TV endocarditis and MRSA bacteremia with associated septic pulmonary emboli. [facility]  [facility] 7[facility]30- s/p TEE which revealed multiple vegetations with severe wide open tricuspid regurgitation  Pt sleeping soundly at time of visit. RD did not wake pt.   Meal intake remains fair to poor; PO: 50-100%. Pt has been refusing Boost Breeze supplements (also refused Ensure during prior visit).   Reviewed MD notes; per CVTS will order CT of chest for pulmonary abscess, dental consult for orthopanogram, and potential OR next Monday (03/29/18) for valve replacement. Pt will likely be hospitalized to 05/01/18 for completion of IV antibiotics.   Labs reviewed.   Diet Order:   Diet Order           Diet Heart Room service appropriate? Yes; Fluid consistency: Thin  Diet effective now          EDUCATION NEEDS:   No education needs have been identified at this time  Skin:  Skin Assessment: Reviewed RN Assessment  Last BM:  03/20/18  Height:   Ht Readings from Last 1 Encounters:  03/23/18 5\' 7"  (1.702 m)     Weight:   Wt Readings from Last 1 Encounters:  03/23/18 148 lb (67.1 kg)    Ideal Body Weight:  61.36 kg  BMI:  Body mass index is 23.18 kg/m.  Estimated Nutritional Needs:   Kcal:  1700-1905 (25-28 kcal/kg)  Protein:  75-85 grams  Fluid:  >/= 2 L/day    Messiah Rovira A. Mayford KnifeWilliams, RD, LDN, CDE Pager: [phone] After hours Pager: [phone]

## 2018-03-24 NOTE — Progress Notes (Signed)
[facility]   Reason for visit: Follow up on endocarditis  Interval History: complaint of new shoulder pain, TEE notable for multiple vegetations on TV and severe, wide-open regurg and have recommended surgery evaluation.   Day 5 daptomycin Day 4 Bactrim  Physical Exam: Constitutional:  Vitals:   03/24/18 0343 03/24/18 0757  BP: (!) 101/58 94/69  Pulse: (!) 111 (!) 105  Resp: (!) 22 (!) 24  Temp: 99.5 F (37.5 C) 99.6 F (37.6 C)  SpO2:  96%   patient appears in NAD Eyes: anicteric Respiratory: Normal respiratory effort; CTA B Cardiovascular: RRR GI: soft, nt, nd  Review of Systems: Constitutional: negative for fevers and chills Gastrointestinal: negative for nausea and diarrhea  Lab Results  Component Value Date   WBC 19.5 (H) 03/23/2018   HGB 10.8 (L) 03/23/2018   HCT 33.7 (L) 03/23/2018   MCV 92.8 03/23/2018   PLT 614 (H) 03/23/2018    Lab Results  Component Value Date   CREATININE 1.14 (H) 03/23/2018   BUN 24 (H) 03/23/2018   NA 137 03/23/2018   K 4.7 03/23/2018   CL 100 03/23/2018   CO2 25 03/23/2018    Lab Results  Component Value Date   ALT 73 (H) 03/22/2018   AST 52 (H) 03/22/2018   ALKPHOS 80 03/22/2018     Microbiology: Recent Results (from the past 240 hour(s))  Culture, blood (Routine x 2)     Status: None (Preliminary result)   Collection Time: 03/20/18  6:30 PM  Result Value Ref Range Status   Specimen Description   Final    BLOOD LEFT ARM Performed at [facility], [address] [place], [place] [address]    Special Requests   Final    BOTTLES DRAWN AEROBIC AND ANAEROBIC Blood Culture adequate volume Performed at [facility], [address] [place], [place] [address]    Culture   Final    NO GROWTH 2 DAYS Performed at [facility], [address] [place], [place] [address]    Report Status PENDING  Incomplete  Culture, blood (Routine x 2)     Status:  None (Preliminary result)   Collection Time: 03/21/18  5:51 AM  Result Value Ref Range Status   Specimen Description   Final    BLOOD RIGHT HAND Performed at [facility], [address] [place], [place] [address]    Special Requests   Final    BOTTLES DRAWN AEROBIC ONLY Blood Culture adequate volume Performed at [facility], [address] [place], [place] [address]    Culture   Final    NO GROWTH 2 DAYS Performed at [facility], [address] [place], [place] [address]    Report Status PENDING  Incomplete  MRSA PCR Screening     Status: None   Collection Time: 03/22/18  5:52 PM  Result Value Ref Range Status   MRSA by PCR NEGATIVE NEGATIVE Final    Comment:        The GeneXpert MRSA Assay (FDA approved for NASAL specimens only), is one component of a comprehensive MRSA colonization surveillance program. It is not intended to diagnose MRSA infection nor to guide or monitor treatment for MRSA infections. Performed at [facility], [address] [place], [place] [address]     Impression/Plan:  1. Endocarditis - will continue with daptomycin for a prolonged period.  Multiple vegetations noted  as well and requesting CVTS consultation.    2.  Allergy - rash to vancomcycin + gent.  Now off. Rash continues to resolve.   3.  Substance abuse - will need continued efforts at remaining drug-free.  She has been provided with information regarding post hospital care.    4. Septic pulmonary emboli - on Bactrim with decreased activity of daptomycin in lungs

## 2018-03-24 NOTE — Progress Notes (Signed)
Pre-op Cardiac Surgery  Carotid Findings:  Bilateral - No evidence of carotid stenosis. Vertebral artery flow is antegrade.  Upper Extremity Right Left  Brachial Pressures 102 Triphasic IV access Triphasic  Radial Waveforms Triphasic Triphasic  Ulnar Waveforms Triphasic Triphasic  Palmar Arch (Allen's Test) Normal Normal   Findings:  Palmar arch evaluation - Doppler waveforms remained normal bilaterally with both radial and ulnar compressions.  [employer] Kendre Sires, RVS 03/24/2018, 4:43 PM

## 2018-03-24 NOTE — Plan of Care (Signed)
  Problem: Health Behavior/Discharge Planning: Goal: Ability to manage health-related needs will improve Outcome: Progressing   Problem: Clinical Measurements: Goal: Ability to maintain clinical measurements within normal limits will improve Outcome: Progressing Goal: Will remain free from infection Outcome: Progressing Goal: Diagnostic test results will improve Outcome: Progressing Goal: Respiratory complications will improve Outcome: Progressing   Problem: Activity: Goal: Risk for activity intolerance will decrease Outcome: Progressing   Problem: Nutrition: Goal: Adequate nutrition will be maintained Outcome: Progressing   Problem: Elimination: Goal: Will not experience complications related to bowel motility Outcome: Progressing Goal: Will not experience complications related to urinary retention Outcome: Progressing   Problem: Pain Managment: Goal: General experience of comfort will improve Outcome: Progressing   Problem: Safety: Goal: Ability to remain free from injury will improve Outcome: Progressing   Problem: Skin Integrity: Goal: Risk for impaired skin integrity will decrease Outcome: Progressing

## 2018-03-24 NOTE — Progress Notes (Signed)
Progress Note  Patient Name: Tiffany Salazar [name] Date of Encounter: 03/24/2018  Primary Cardiologist: new- Dr. Duke Salviaandolph  Subjective   Some left shoulder pain, hurts more with deep breath.   Inpatient Medications    Scheduled Meds: . buprenorphine-naloxone  1 tablet Sublingual Daily  . dicyclomine  10 mg Oral TID AC  . multivitamin with minerals  1 tablet Oral Daily  . nicotine  14 mg Transdermal Daily  . pantoprazole  40 mg Oral Daily  . polyethylene glycol  17 g Oral Daily  . senna  1 tablet Oral Daily  . sulfamethoxazole-trimethoprim  1 tablet Oral Q12H   Continuous Infusions: . DAPTOmycin (CUBICIN)  IV Stopped (03/23/18 2335)  . lactated ringers Stopped (03/23/18 1501)   PRN Meds: acetaminophen **OR** acetaminophen, diphenhydrAMINE, gi cocktail, ondansetron **OR** ondansetron (ZOFRAN) IV, traMADol, traZODone   Vital Signs    Vitals:   03/23/18 2005 03/23/18 2331 03/24/18 0343 03/24/18 0757  BP: 99/61 119/67 (!) 101/58 94/69  Pulse: 88 91 (!) 111 (!) 105  Resp: 19 (!) 21 (!) 22 (!) 24  Temp: 99.2 F (37.3 C) 99.5 F (37.5 C) 99.5 F (37.5 C) 99.6 F (37.6 C)  TempSrc: Oral Oral Oral Oral  SpO2:    96%  Weight:      Height:        Intake/Output Summary (Last 24 hours) at 03/24/2018 1053 Last data filed at 03/24/2018 0800 Gross per 24 hour  Intake 944.46 ml  Output -  Net 944.46 ml   Filed Weights   03/20/18 2119 03/23/18 1336  Weight: 150 lb (68 kg) 148 lb (67.1 kg)    Telemetry    NSR without significant ventricular ectopy - Personally Reviewed  ECG    NSR without significant ST-T wave changes - Personally Reviewed  Physical Exam   GEN: No acute distress.   Neck: No JVD Cardiac: RRR, no rubs, or gallops.  3/6 systolic murmur near the RUSB Respiratory: Clear to auscultation bilaterally. GI: Soft, nontender, non-distended  MS: No edema; No deformity. Neuro:  Nonfocal  Psych: Normal affect   Labs    Chemistry Recent Labs  Lab  03/20/18 1828 03/22/18 1346 03/23/18 0852  NA 140 140 137  K 4.3 4.3 4.7  CL 106 103 100  CO2 25 27 25   GLUCOSE 99 117* 85  BUN 19 23* 24*  CREATININE 0.99 1.11* 1.14*  CALCIUM 8.2* 8.7* 8.3*  PROT 7.3 7.5  --   ALBUMIN 2.2* 2.6*  --   AST 71* 52*  --   ALT 55* 73*  --   ALKPHOS 68 80  --   BILITOT 0.4 0.6  --   GFRNONAA >60 >60 >60  GFRAA >60 >60 >60  ANIONGAP 9 10 12      Hematology Recent Labs  Lab 03/22/18 1346 03/23/18 0852 03/24/18 1019  WBC 15.9* 19.5* 20.3*  RBC 3.65* 3.63* 3.38*  HGB 10.9* 10.8* 10.2*  HCT 33.2* 33.7* 31.4*  MCV 91.0 92.8 92.9  MCH 29.9 29.8 30.2  MCHC 32.8 32.0 32.5  RDW 15.9* 15.6* 15.4  PLT 663* 614* 557*    Cardiac EnzymesNo results for input(s): TROPONINI in the last 168 hours. No results for input(s): TROPIPOC in the last 168 hours.   BNPNo results for input(s): BNP, PROBNP in the last 168 hours.   DDimer No results for input(s): DDIMER in the last 168 hours.   Radiology    No results found.  Cardiac Studies   TEE 03/23/2018 LV  EF: 55% -   60% Study Conclusions  - Left ventricle: The cavity size was normal. Wall thickness was   normal. Systolic function was normal. The estimated ejection   fraction was in the range of 55% to 60%. Wall motion was normal;   there were no regional wall motion abnormalities. - Aortic valve: No evidence of vegetation. - Mitral valve: No evidence of vegetation. - Left atrium: No evidence of thrombus in the atrial cavity or   appendage. - Right atrium: No evidence of thrombus in the atrial cavity or   appendage. - Atrial septum: No defect or patent foramen ovale was identified. - Tricuspid valve: Large 0.5 x 1 cm vegetations of the septal and   posterior leafelts (seen in 2D and 3D views) with associated   severe, wide-open TR. Peak RV-RA gradient (S): 88 mm Hg.   Regurgitant VTI: 88 cm. - Pulmonic valve: No evidence of vegetation.  Impressions:  - 2 large mobile vegetations of the  TV with severe TR and high   embolic potential. Consider CT surgical consult for debridement   and TV replacement.   Patient Profile     37 y.o. female with PMH of IVDU who was recently admitted at outside facility with tricuspid valve endocarditis and MRSA bactermia with associated septic PE. TEE obtained at [facility] shows large vegetation on the tricuspid valve. Seen by CT surgery, recommend tricuspid repair.   Assessment & Plan    1. TV endocarditis with resultant severe TR:   - followed by ID, underwent TEE on 03/23/2018 which showed Large 0.5 x 1 cm vegetations of the septal and posterior leafelts (seen in 2D and 3D views) with associated severe, wide-open TR  - evaluated by CT surgery, plan for TV repair  2. Septic PE related to #1  3. IVDU: patient is willing to quit.   4. Left shoulder pain: worse with deep inspiration, likely musculoskeletal,      For questions or updates, please contact [facility] Please consult [email or web address] for contact info under Cardiology/STEMI.      Ramond Dial, PA  03/24/2018, 10:53 AM
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
## 2018-03-24 NOTE — Progress Notes (Signed)
Pharmacy Antibiotic Note  Tiffany Salazar N Tiffany Salazar is a 37 y.o. female admitted on 03/20/2018 with endocarditis.  Pharmacy has been consulted for daptomycin dosing. [place] was [place] at [place] in [place] from 7/17 to 7-26 for tricuspid endocarditis, MRSA bacteremia and pulmonary septic emboli. On 7/14 she developed a rash that was thought to be caused by Vancomycin and was changed to Daptomycin at that time.  [facility]   Tmax 99.5, wbc 20 trending up, scr normal at 1.1. CK was ordered for yesterday but never done (cancelled in transfer?), will order for tomorrow.    Continue daptomycin for endocarditis, septra for septic pulmonary emboli. No dose adjustments warranted at this time.   Plan: Daptomycin 544 mg IV q24h (~ 8 mg/kg) CK in am then weekly F/u scr/cultures  Height: 5\' 7"  (170.2 cm) Weight: 148 lb (67.1 kg) IBW/kg (Calculated) : 61.6  Temp (24hrs), Avg:99 F (37.2 C), Min:98.2 F (36.8 C), Max:99.6 F (37.6 C)  Recent Labs  Lab 03/20/18 1828 03/20/18 1833 03/22/18 1346 03/23/18 0852 03/24/18 1019  WBC 17.4*  --  15.9* 19.5* 20.3*  CREATININE 0.99  --  1.11* 1.14*  --   LATICACIDVEN  --  1.37  --   --   --     Estimated Creatinine Clearance: 67 mL/min (A) (by C-G formula based on SCr of 1.14 mg/dL (H)).    Allergies  Allergen Reactions  . Gentamicin Rash  . Vancomycin Rash    Antimicrobials this admission: Daptomycin 7/27 >>  Bacrtim 7/28 >>    Microbiology results: 7/28 BCx: ngtd   Thank you for allowing pharmacy to be a part of this patient's care.  Sheppard CoilFrank Wilson PharmD., BCPS Clinical Pharmacist 03/24/2018 11:16 AM

## 2018-03-24 NOTE — Anesthesia Postprocedure Evaluation (Signed)
Anesthesia Post Note  Patient: ELINE GENG  Procedure(s) Performed: TRANSESOPHAGEAL ECHOCARDIOGRAM (TEE) (N/A )     Patient location during evaluation: PACU Anesthesia Type: MAC Level of consciousness: awake and alert Pain management: pain level controlled Vital Signs Assessment: post-procedure vital signs reviewed and stable Respiratory status: spontaneous breathing, nonlabored ventilation, respiratory function stable and patient connected to nasal cannula oxygen Cardiovascular status: stable and blood pressure returned to baseline Postop Assessment: no apparent nausea or vomiting Anesthetic complications: no    Last Vitals:  Vitals:   03/24/18 0343 03/24/18 0757  BP: (!) 101/58 94/69  Pulse: (!) 111 (!) 105  Resp: (!) 22 (!) 24  Temp: 37.5 C 37.6 C  SpO2:  96%    Last Pain:  Vitals:   03/24/18 0757  TempSrc: Oral  PainSc: 0-No pain                 Ryan P Ellender
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
## 2018-03-24 NOTE — Progress Notes (Addendum)
Family Medicine Teaching Service Daily Progress Note Intern Pager: [phone]  Patient name: Tiffany Salazar [name] Medical record number: [id number] Date of birth: 02/24/1981 Age: 37 y.o. Gender: female  Primary Care Provider: Cory Munched, [facility], MD Consultants: cardiology, ID Code Status: full  Pt Overview and Major Events to Date:  7/17-26 In [place] hosp to endocarditis/MRSA bacteremia/PE/hemoptosis.  Reports neg reaction to vanc/zosyn and thoracentesis from PE. 7[facility] dapt/bactrim [facility]/30 TEE   Assessment and Plan: TV endocarditis Tricuspid valve with RV vegetation and septic emboli to lungs. Secondary to IV drug use and MRSA bacteremia.  Adverse reaction to vanc/zosyn. Echo showed multiple TV vegetations with severe, wide-open tricuspid regurg, high thrombic potential, no LAA thrombus, negative for PFO, LVEF 55-60%. Cardiology recommended surgical consult. Spoke with CVTS, ordering CT chest to evaluate for pulmonary abscess, consult Dental for orthopanogram, and tentatively take to OR next Monday for valve replacement.  -ID consulted -cardiology consulted -cont Daptomycin (7/27- 9/7?) 6wks per ID -cont Bactrim (7/27- 9/7?) 6wks per ID  -tylenol prn for fever -f/u CVTS recs -f/u dental -f/u CT chest -recheck BMP now, will restart IVF as needed  Pulmonary septic emboli: Stable on RA Secondary to endocarditis/bacteremia. Patient is s/p thoracenteses for parapneumonic effusions. Chest x-ray on admission without evidence of pleural effusions. -Management above  MRSA bacteremia -Management above -Repeat blood cultures (7/28) NGTD  IV drug use/Polysubstance abuse COWS score 0>0>2 overnight.  On suboxone.  States that she is comfortable on suboxone. -COWS -cont Suboxone 8-2 mg daily -nicotine path 14mg  -tylenol prn for pain  Rash: Improved Maculopapular.  Thought to be secondary to IV antibiotics. Treated with steroids and benadryl.  -Continue benadryl prn  Anemia: Stable Normocytic, Hgb 10.8 this AM. -Repeat CBC -Iron/TIBC/Ferritin  Leukocytosis  WBC 20.3 this AM.  Afebrile. Likely 2/2 known infection. -cont to monitor with CBC -cont antibiotics  Sleep disorder: Stable Sleeping well on trazodone. -cont trazadone 50mg  prn  FEN/GI: Heart Healthy  PPx: SCDs (recent hx of hemoptosis while at outside hospital, will need to determine timeframe of chemical prophylaxis)  Disposition: continued inpatient until likely 9/7 for supervised IV abx  Subjective:  Patient notes that occasionally her chest feels "funny," but is unable to explain further.  Denies chest pain, denies shortness of breath, denies palpitations.  Notes, "this is all a lot and I'm just trying to process it.  I'm coming to terms with the fact that I've done this to myself."  Objective: Temp:  [98.2 F (36.8 C)-99.6 F (37.6 C)] 99.6 F (37.6 C) (07/31 0757) Pulse Rate:  [75-111] 105 (07/31 0757) Resp:  [13-26] 24 (07/31 0757) BP: (55-119)/(26-69) 94/69 (07/31 0757) SpO2:  [96 %-100 %] 96 % (07/31 0757) Weight:  [148 lb (67.1 kg)] 148 lb (67.1 kg) (07/30 1336)  Physical Exam: General: 37 y.o. female in NAD, pleasant Cardio: RRR, 2/6 murmur Lungs: CTAB, no wheezing, no rhonchi, no crackles Abdomen: Soft, non-tender to palpation, positive bowel sounds Skin: warm and dry, peeling, non-erythematous skin on back Extremities: No edema   Laboratory: Recent Labs  Lab 03/22/18 1346 03/23/18 0852 03/24/18 1019  WBC 15.9* 19.5* 20.3*  HGB 10.9* 10.8* 10.2*  HCT 33.2* 33.7* 31.4*  PLT 663* 614* 557*   Recent Labs  Lab 03/20/18 1828 03/22/18 1346 03/23/18 0852  NA 140 140 137  K 4.3 4.3 4.7  CL 106 103 100  CO2  25 27 25   BUN 19 23* 24*  CREATININE 0.99 1.11* 1.14*  CALCIUM 8.2* 8.7* 8.3*  PROT 7.3 7.5  --   BILITOT 0.4 0.6  --   ALKPHOS 68 80  --    ALT 55* 73*  --   AST 71* 52*  --   GLUCOSE 99 117* 85    Imaging/Diagnostic Tests: Dg Chest 2 View  Result Date: 03/20/2018 CLINICAL DATA:  Chest pain EXAM: CHEST - 2 VIEW COMPARISON:  10/28/2017 chest radiograph. FINDINGS: Stable cardiomediastinal silhouette with normal heart size. No pneumothorax. No pleural effusion. There are numerous new focal patchy nodular opacities throughout both lungs, most of which demonstrate cavitary change. IMPRESSION: Numerous new focal patchy nodular opacities throughout both lungs, most of which demonstrate cavitary change, very likely to represent septic emboli in this patient with a history of IV drug abuse. Follow-up chest imaging recommended to document resolution. Chest CT with IV contrast may be obtained for further characterization. Electronically Signed   By: Delbert Phenix M.D.   On: 03/20/2018 17:54     Devyn Griffing, Solmon Ice, DO 03/24/2018, 10:52 AM PGY-2, [facility] FPTS Intern pager: [phone], text pages welcome

## 2018-03-24 NOTE — Progress Notes (Signed)
Received call from CT that patient needs a new IV for ct scan. Put order in for IV team because patient is a difficult iv stick and ultrasound is needed. Waiting for IV team to place. Will continue to monitor.

## 2018-03-25 DIAGNOSIS — R110 Nausea: Secondary | ICD-10-CM

## 2018-03-25 DIAGNOSIS — F1910 Other psychoactive substance abuse, uncomplicated: Secondary | ICD-10-CM

## 2018-03-25 DIAGNOSIS — T50905D Adverse effect of unspecified drugs, medicaments and biological substances, subsequent encounter: Secondary | ICD-10-CM

## 2018-03-25 DIAGNOSIS — R7881 Bacteremia: Secondary | ICD-10-CM

## 2018-03-25 DIAGNOSIS — I330 Acute and subacute infective endocarditis: Principal | ICD-10-CM

## 2018-03-25 DIAGNOSIS — R768 Other specified abnormal immunological findings in serum: Secondary | ICD-10-CM

## 2018-03-25 DIAGNOSIS — F1990 Other psychoactive substance use, unspecified, uncomplicated: Secondary | ICD-10-CM

## 2018-03-25 DIAGNOSIS — K089 Disorder of teeth and supporting structures, unspecified: Secondary | ICD-10-CM

## 2018-03-25 LAB — CBC
HCT: 33.3 % — ABNORMAL LOW (ref 36.0–46.0)
Hemoglobin: 10.8 g/dL — ABNORMAL LOW (ref 12.0–15.0)
MCH: 30.1 pg (ref 26.0–34.0)
MCHC: 32.4 g/dL (ref 30.0–36.0)
MCV: 92.8 fL (ref 78.0–100.0)
Platelets: 536 10*3/uL — ABNORMAL HIGH (ref 150–400)
RBC: 3.59 MIL/uL — ABNORMAL LOW (ref 3.87–5.11)
RDW: 15.3 % (ref 11.5–15.5)
WBC: 15.5 10*3/uL — ABNORMAL HIGH (ref 4.0–10.5)

## 2018-03-25 LAB — TROPONIN I: Troponin I: <0.03 ng/mL (ref <0.03)

## 2018-03-25 LAB — BASIC METABOLIC PANEL
Anion gap: 12 (ref 5–15)
BUN: 16 mg/dL (ref 6–20)
CO2: 23 mmol/L (ref 22–32)
Calcium: 8.6 mg/dL — ABNORMAL LOW (ref 8.9–10.3)
Chloride: 97 mmol/L — ABNORMAL LOW (ref 98–111)
Creatinine, Ser: 1.11 mg/dL — ABNORMAL HIGH (ref 0.44–1.00)
GFR calc Af Amer: >60 mL/min (ref >60)
GFR calc non Af Amer: >60 mL/min (ref >60)
Glucose, Bld: 107 mg/dL — ABNORMAL HIGH (ref 70–99)
Potassium: 4.7 mmol/L (ref 3.5–5.1)
Sodium: 132 mmol/L — ABNORMAL LOW (ref 135–145)

## 2018-03-25 LAB — CK: Total CK: 18 U/L — ABNORMAL LOW (ref 38–234)

## 2018-03-25 MED ORDER — BISACODYL 10 MG RE SUPP
10.0000 mg | Freq: Once | RECTAL | Status: AC
  Administered 2018-03-25: 10 mg via RECTAL
  Filled 2018-03-25: qty 1

## 2018-03-25 MED ORDER — SODIUM CHLORIDE 0.9 % IV SOLN
460.0000 mg | INTRAVENOUS | Status: DC
  Administered 2018-03-25: 460 mg via INTRAVENOUS
  Filled 2018-03-25 (×2): qty 9.2

## 2018-03-25 MED ORDER — KETOROLAC TROMETHAMINE 15 MG/ML IJ SOLN
15.0000 mg | Freq: Once | INTRAMUSCULAR | Status: AC
  Administered 2018-03-25: 15 mg via INTRAVENOUS
  Filled 2018-03-25: qty 1

## 2018-03-25 MED ORDER — SODIUM CHLORIDE 0.9 % IV SOLN: 550.0000 mg | Freq: Every day | INTRAVENOUS | Status: DC

## 2018-03-25 MED ORDER — SODIUM CHLORIDE 0.9 % IV SOLN
500.0000 mg | Freq: Every day | INTRAVENOUS | Status: DC
  Administered 2018-03-26 – 2018-03-28 (×3): 500 mg via INTRAVENOUS
  Filled 2018-03-25 (×4): qty 10

## 2018-03-25 MED ORDER — SENNA 8.6 MG PO TABS
1.0000 | ORAL_TABLET | Freq: Two times a day (BID) | ORAL | Status: DC
  Administered 2018-03-25 – 2018-03-28 (×7): 8.6 mg via ORAL
  Filled 2018-03-25 (×7): qty 1

## 2018-03-25 MED ORDER — DOCUSATE SODIUM 100 MG PO CAPS
100.0000 mg | ORAL_CAPSULE | Freq: Every day | ORAL | Status: DC
  Administered 2018-03-25 – 2018-03-28 (×4): 100 mg via ORAL
  Filled 2018-03-25 (×4): qty 1

## 2018-03-25 MED ORDER — BUSPIRONE HCL 5 MG PO TABS
15.0000 mg | ORAL_TABLET | Freq: Two times a day (BID) | ORAL | Status: DC
  Administered 2018-03-25 – 2018-05-10 (×92): 15 mg via ORAL
  Filled 2018-03-25 (×94): qty 1

## 2018-03-25 MED ORDER — MUSCLE RUB 10-15 % EX CREA
TOPICAL_CREAM | CUTANEOUS | Status: DC | PRN
  Administered 2018-03-26 (×2): via TOPICAL
  Filled 2018-03-25: qty 85

## 2018-03-25 NOTE — Progress Notes (Signed)
Visited with patient per spiritual care consult to provide support but upon arrival patient was visiting with other family and request visit later.  Patient will have nurse page when ready.  Chaplain available as needed.  Venida Jarvis[place]lington, Meklit Cotta, [place], [facility], Pager [phone]

## 2018-03-25 NOTE — Progress Notes (Signed)
Progress Note  Patient Name: Tiffany Salazar Date of Encounter: 03/25/2018  Primary Cardiologist: Chilton Si, MD   Subjective   Complains of L shoulder pain and constipation.   Inpatient Medications    Scheduled Meds: . buprenorphine-naloxone  1 tablet Sublingual Daily  . dicyclomine  10 mg Oral TID AC  . iopamidol      . multivitamin with minerals  1 tablet Oral Daily  . nicotine  14 mg Transdermal Daily  . pantoprazole  40 mg Oral Daily  . polyethylene glycol  17 g Oral Daily  . senna  1 tablet Oral Daily  . sulfamethoxazole-trimethoprim  1 tablet Oral Q12H   Continuous Infusions: . DAPTOmycin (CUBICIN)  IV 550 mg (03/24/18 2301)  . lactated ringers Stopped (03/23/18 1501)   PRN Meds: acetaminophen **OR** acetaminophen, diphenhydrAMINE, gi cocktail, ondansetron **OR** ondansetron (ZOFRAN) IV, traMADol, traZODone   Vital Signs    Vitals:   03/24/18 1951 03/24/18 2350 03/25/18 0345 03/25/18 0755  BP: 105/83 103/73 119/65 100/77  Pulse: (!) 159 94  (!) 103  Resp: 17 (!) 21 (!) 21 15  Temp: 99.5 F (37.5 C) 99.2 F (37.3 C) 99 F (37.2 C) 98.7 F (37.1 C)  TempSrc: Oral Oral Oral Oral  SpO2:  94%  94%  Weight:      Height:        Intake/Output Summary (Last 24 hours) at 03/25/2018 0941 Last data filed at 03/24/2018 2301 Gross per 24 hour  Intake 600 ml  Output -  Net 600 ml   [facility] Weights   03/20/18 2119 03/23/18 1336  Weight: 150 lb (68 kg) 148 lb (67.1 kg)    Telemetry    Sinus rhythm, sinus tachycardia.  No events - Personally Reviewed  ECG    03/23/18: Sinus rhythm rate 74 bpm.  - Personally Reviewed  Physical Exam   VS:  BP 100/77 (BP Location: Right Wrist)   Pulse (!) 103   Temp 98.7 F (37.1 C) (Oral)   Resp 15   Ht 5\' 7"  (1.702 m)   Wt 148 lb (67.1 kg)   LMP 03/15/2018   SpO2 94%   BMI 23.18 kg/m  , BMI Body mass index is 23.18 kg/m. GENERAL:  Chronically ill-appearing HEENT: Pupils equal round and reactive, fundi not  visualized, oral mucosa unremarkable NECK:  No jugular venous distention, waveform within normal limits, carotid upstroke brisk and symmetric, no bruits LUNGS:  Clear to auscultation bilaterally HEART:  RRR.  PMI not displaced or sustained,S1 and S2 within normal limits, no S3, no S4, no clicks, no rubs, II/VI systolic murmur at LLSB. ABD:  Flat, positive bowel sounds normal in frequency in pitch, no bruits, no rebound, no guarding, no midline pulsatile mass, no hepatomegaly, no splenomegaly EXT:  2 plus pulses throughout, no edema, no cyanosis no clubbing SKIN:  Diffuse desquamation NEURO:  Cranial nerves II through XII grossly intact, motor grossly intact throughout [place]:  Cognitively intact, oriented to person place and time   Labs    Chemistry Recent Labs  Lab 03/20/18 1828 03/22/18 1346 03/23/18 0852 03/24/18 1144 03/25/18 0644  NA 140 140 137 135 132*  K 4.3 4.3 4.7 5.2* 4.7  CL 106 103 100 100 97*  CO2 25 27 25 26 23   GLUCOSE 99 117* 85 90 107*  BUN 19 23* 24* 17 16  CREATININE 0.99 1.11* 1.14* 1.13* 1.11*  CALCIUM 8.2* 8.7* 8.3* 8.2* 8.6*  PROT 7.3 7.5  --   --   --  ALBUMIN 2.2* 2.6*  --   --   --   AST 71* 52*  --   --   --   ALT 55* 73*  --   --   --   ALKPHOS 68 80  --   --   --   BILITOT 0.4 0.6  --   --   --   GFRNONAA >60 >60 >60 >60 >60  GFRAA >60 >60 >60 >60 >60  ANIONGAP 9 10 12 9 12      Hematology Recent Labs  Lab 03/23/18 0852 03/24/18 1019 03/25/18 0515  WBC 19.5* 20.3* 15.5*  RBC 3.63* 3.38* 3.59*  HGB 10.8* 10.2* 10.8*  HCT 33.7* 31.4* 33.3*  MCV 92.8 92.9 92.8  MCH 29.8 30.2 30.1  MCHC 32.0 32.5 32.4  RDW 15.6* 15.4 15.3  PLT 614* 557* 536*    Cardiac EnzymesNo results for input(s): TROPONINI in the last 168 hours. No results for input(s): TROPIPOC in the last 168 hours.   BNPNo results for input(s): BNP, PROBNP in the last 168 hours.   DDimer No results for input(s): DDIMER in the last 168 hours.   Radiology    Dg  Orthopantogram  Result Date: 03/24/2018 CLINICAL DATA:  37 year old female with endocarditis. Evaluate dentition. EXAMTresa Endo COMPARISON:  12/16/2017 CT FINDINGS: There is equivocal lucency along the lateral root of tooth #3. No other periapical lucencies identified. No gross dental caries noted. IMPRESSION: Equivocal lucency/periapical abscess along the lateral root of tooth #3 Electronically Signed   By: Harmon Pier M.D.   On: 03/24/2018 13:52   Ct Angio Chest Pe W Or Wo Contrast  Result Date: 03/24/2018 CLINICAL DATA:  Endocarditis with septic pulmonary emboli. Dyspnea. Patient reports preop for cardiac valve replacement. EXAM: CT ANGIOGRAPHY CHEST WITH CONTRAST TECHNIQUE: Multidetector CT imaging of the chest was performed using the standard protocol during bolus administration of intravenous contrast. Multiplanar CT image reconstructions and MIPs were obtained to evaluate the vascular anatomy. CONTRAST:  ISOVUE-370 IOPAMIDOL (ISOVUE-370) INJECTION 76% COMPARISON:  Chest radiograph 03/20/2018 FINDINGS: Cardiovascular: There are no filling defects within the pulmonary arteries to suggest pulmonary embolus. Thoracic aorta is normal in caliber. No aortic dissection. The heart is normal in size. No pericardial effusion. Mediastinum/Nodes: Small mediastinal nodes not enlarged by size criteria. Prominent right hilar node measures 10 mm. Prominent left axillary node measuring 10 mm short axis. Small right axillary nodes, partially obscured by dense IV contrast causing streak artifact. Esophagus is decompressed. No thyroid nodule. Lungs/Pleura: Multifocal bilateral cavitary and irregular nodules consistent with septic emboli, greater in the right lung. Some of these cavitary lesions have surrounding parenchymal ground-glass opacities that may represent hemorrhage. Small pleural effusions, right greater than left. Trachea and mainstem bronchi are patent. Upper Abdomen: No acute abnormality.  Musculoskeletal: Mild remote superior endplate L1 compression fracture. No acute osseous abnormality. Review of the MIP images confirms the above findings. IMPRESSION: 1. Multiple bilateral septic emboli.  Small pleural effusions. 2. No pulmonary embolus. Electronically Signed   By: Rubye Oaks M.D.   On: 03/24/2018 23:00    Cardiac Studies   TEE 03/23/18:  Study Conclusions  - Left ventricle: The cavity size was normal. Wall thickness was   normal. Systolic function was normal. The estimated ejection   fraction was in the range of 55% to 60%. Wall motion was normal;   there were no regional wall motion abnormalities. - Aortic valve: No evidence of vegetation. - Mitral valve: No evidence of vegetation. - Left  atrium: No evidence of thrombus in the atrial cavity or   appendage. - Right atrium: No evidence of thrombus in the atrial cavity or   appendage. - Atrial septum: No defect or patent foramen ovale was identified. - Tricuspid valve: Large 0.5 x 1 cm vegetations of the septal and   posterior leafelts (seen in 2D and 3D views) with associated   severe, wide-open TR. Peak RV-RA gradient (S): 88 mm Hg.   Regurgitant VTI: 88 cm. - Pulmonic valve: No evidence of vegetation.  Impressions:  - 2 large mobile vegetations of the TV with severe TR and high   embolic potential. Consider CT surgical consult for debridement   and TV replacement.   ECHO: 03/10/2018: Summary:  -Clinical question: Sepsis  -Left ventricle: Size was normal. Systolic function was normal. Ejection  fraction was estimated in the range of 55 % to 60 %. There were no regional  wall motion abnormalities. Left ventricular diastolic function parameters were normal.  -Mitral valve: There was no evidence for vegetation.  -Right ventricle: The size was normal. Systolic function was normal.  Systolic pressure was within the normal range. Estimated peak pressure was at  least 24 mmHg. There was a  mobile mass in the ventricular cavity. It may  represent a vegetation.  -Tricuspid valve: There was trace regurgitation. There was echogenic mobile  mass on tricuspid valve leaflets and also in the right ventricular chamber  consistent with vegetations .  Pulmonary veins: Not well visualized.  Right ventricle: The size was normal. Systolic function was normal. Wall  thickness was normal. There was a mobile mass in the ventricular cavity. It  may represent a vegetation. Doppler: Systolic pressure was within the normal  range. Estimated peak pressure was at least 24 mmHg.  Pulmonic valve: Not well visualized.  Tricuspid valve: The valve structure was normal. There was normal leaflet  separation. There was echogenic mobile mass on tricuspid valve leaflets and  also in the right ventricular chamber consistent with vegetations . Doppler:  There was no evidence for tricuspid stenosis. There was trace regurgitation.  Right atrium: Size was normal.  Systemic veins: IVC: The inferior vena cava was normal in size and course.  Respirophasic changes were normal.    Patient Profile     37 y.o. female with a history of IV drug abuse here with MRSA tricuspid valve endocarditis and septic pulmonary emboli.  Assessment & Plan    # Tricuspid valve endocarditis: # Septic pulmonary emboli: Outside hospital blood cultures were positive for MRSA.  Cultures here NGTD.  Large TV vegetations noted on TEE with severe TR.  Given this and her septic pulmonary emboli she is unlikely to clear the infection with antibiotics alone.  Plan for TV repair/replacement on 8/5.  Antibiotics per ID.  # IVDA: # Polysubstance abuse:  Patient eager to quit.  Started suboxone this hospitalization and she has a nicotine patch.      For questions or updates, please contact [facility] Please consult [email or web address] for contact info under Cardiology/STEMI.      Signed, Chilton Siiffany Seligman, MD  03/25/2018, 9:41 AM

## 2018-03-25 NOTE — Progress Notes (Signed)
[facility]   Reason for visit: Follow up on endocarditis  Interval History: some nausea, no other new complaints.  Plan for surgery 8/5.  Continues on daptomycin and bactrim.  No associated rash.   Day 6 daptomycin Day 5 Bactrim  Physical Exam: Constitutional:  Vitals:   03/25/18 0345 03/25/18 0755  BP: 119/65 100/77  Pulse:  (!) 103  Resp: (!) 21 15  Temp: 99 F (37.2 C) 98.7 F (37.1 C)  SpO2:  94%   patient appears in NAD Eyes: anicteric Respiratory: Normal respiratory effort; CTA B Cardiovascular: RRR GI: soft, nt, nd  Review of Systems: Constitutional: negative for fevers and chills Gastrointestinal: negative for nausea and diarrhea  Lab Results  Component Value Date   WBC 15.5 (H) 03/25/2018   HGB 10.8 (L) 03/25/2018   HCT 33.3 (L) 03/25/2018   MCV 92.8 03/25/2018   PLT 536 (H) 03/25/2018    Lab Results  Component Value Date   CREATININE 1.11 (H) 03/25/2018   BUN 16 03/25/2018   NA 132 (L) 03/25/2018   K 4.7 03/25/2018   CL 97 (L) 03/25/2018   CO2 23 03/25/2018    Lab Results  Component Value Date   ALT 73 (H) 03/22/2018   AST 52 (H) 03/22/2018   ALKPHOS 80 03/22/2018     Microbiology: Recent Results (from the past 240 hour(s))  Culture, blood (Routine x 2)     Status: None (Preliminary result)   Collection Time: 03/20/18  6:30 PM  Result Value Ref Range Status   Specimen Description   Final    BLOOD LEFT ARM Performed at [facility], [address] [place], [place] [address]    Special Requests   Final    BOTTLES DRAWN AEROBIC AND ANAEROBIC Blood Culture adequate volume Performed at [facility], [address], [place], [place] [address]    Culture   Final    NO GROWTH 3 DAYS Performed at [facility], [address] [place], [place] [address]    Report Status PENDING  Incomplete  Culture, blood (Routine x 2)     Status: None (Preliminary result)   Collection  Time: 03/21/18  5:51 AM  Result Value Ref Range Status   Specimen Description   Final    BLOOD RIGHT HAND Performed at [facility], [address], [place], [place] [address]    Special Requests   Final    BOTTLES DRAWN AEROBIC ONLY Blood Culture adequate volume Performed at [facility], [address] [place], [place] [address]    Culture   Final    NO GROWTH 3 DAYS Performed at [facility], [address], [place], [place] [address]    Report Status PENDING  Incomplete  MRSA PCR Screening     Status: None   Collection Time: 03/22/18  5:52 PM  Result Value Ref Range Status   MRSA by PCR NEGATIVE NEGATIVE Final    Comment:        The GeneXpert MRSA Assay (FDA approved for NASAL specimens only), is one component of a comprehensive MRSA colonization surveillance program. It is not intended to diagnose MRSA infection nor to guide or monitor treatment for MRSA infections. Performed at [facility], [address] [place], [place] [address]     Impression/Plan:  1. Endocarditis - will continue with daptomycin for a prolonged period.  Surgery next week.  Will  need to continue IV antibiotics for a prolonged period after surgery.     2.  Allergy - rash continues to improve.   3.  Substance abuse - interested in and getting suboxone therapy.  Will need to be plugged in to care at discharge.    4. Septic pulmonary emboli - on Bactrim and tolerating.

## 2018-03-25 NOTE — Progress Notes (Signed)
Complained of constipation despite senokot and colace given this am, refused miralax. MD made aware, with order.

## 2018-03-25 NOTE — Progress Notes (Signed)
FPTS Interim Progress Note  S:In reviewing note from exercise physiologist, I saw that the patient's left shoulder pain from this morning had migrated to her left breast. I went to see the patient who was laying down talking on her phone and appeared to be comfortable. She described the pain as sharp and constant under her breast. When asking specific questions of exaggeration of pain ie position and associated with breath she just kept repeating "it just hurts" without further clarification. The pain was worsened with palpation. We discussed possible causes and ruling out cardiac cause with troponins and EKG stat. She denied drawing troponins because she does not want to be stuck with needles any more.  O: BP (!) 133/111 (BP Location: Right Wrist)   Pulse (!) 118   Temp 98.7 F (37.1 C) (Oral)   Resp 19   Ht 5\' 7"  (1.702 m)   Wt 148 lb (67.1 kg)   LMP 03/15/2018   SpO2 93%   BMI 23.18 kg/m    Tenderness to palpation of left chest wall.  A/P: Left-sided chest discomfort -ordered EKG stat -canceled troponin order due to patient refusal -patient is already taking tramadol max and has tylenol PRN available  Leeroy Bocknderson, Eleonore Shippee L, DO 03/25/2018, 4:29 PM PGY-1, [facility] pager [phone]

## 2018-03-25 NOTE — Progress Notes (Signed)
Came to ambulate pt however she is crying in pain. C/o left shoulder pain that moved to her left breast. Sts it does not go away with any change in position. We gave her the [facility] booklet and care guide to begin reviewing. We will attempt to ambulate tomorrow. [phone] Ethelda ChickKristan Io Dieujuste CES, ACSM 2:06 PM 03/25/2018

## 2018-03-25 NOTE — Clinical Social Work Note (Signed)
Patient discussed in quality collaborative meeting this morning. Confirmed we will not be able to place patient in SNF for IV antibiotics.  CSW signing off. Consult again if any other social work needs arise.  Charlynn CourtSarah Ras Kollman, CSW [phone]

## 2018-03-26 ENCOUNTER — Inpatient Hospital Stay: Payer: Self-pay

## 2018-03-26 ENCOUNTER — Inpatient Hospital Stay (HOSPITAL_COMMUNITY): Payer: Medicaid Other

## 2018-03-26 DIAGNOSIS — I368 Other nonrheumatic tricuspid valve disorders: Secondary | ICD-10-CM

## 2018-03-26 LAB — BASIC METABOLIC PANEL
Anion gap: 11 (ref 5–15)
Anion gap: 9 (ref 5–15)
BUN: 22 mg/dL — ABNORMAL HIGH (ref 6–20)
BUN: 24 mg/dL — ABNORMAL HIGH (ref 6–20)
CO2: 25 mmol/L (ref 22–32)
CO2: 28 mmol/L (ref 22–32)
Calcium: 8.7 mg/dL — ABNORMAL LOW (ref 8.9–10.3)
Calcium: 8.4 mg/dL — ABNORMAL LOW (ref 8.9–10.3)
Chloride: 98 mmol/L (ref 98–111)
Chloride: 98 mmol/L (ref 98–111)
Creatinine, Ser: 1.88 mg/dL — ABNORMAL HIGH (ref 0.44–1.00)
Creatinine, Ser: 1.4 mg/dL — ABNORMAL HIGH (ref 0.44–1.00)
GFR calc Af Amer: 39 mL/min — ABNORMAL LOW (ref >60)
GFR calc Af Amer: 56 mL/min — ABNORMAL LOW (ref >60)
GFR calc non Af Amer: 34 mL/min — ABNORMAL LOW (ref >60)
GFR calc non Af Amer: 48 mL/min — ABNORMAL LOW (ref >60)
Glucose, Bld: 123 mg/dL — ABNORMAL HIGH (ref 70–99)
Glucose, Bld: 140 mg/dL — ABNORMAL HIGH (ref 70–99)
Potassium: 5.4 mmol/L — ABNORMAL HIGH (ref 3.5–5.1)
Potassium: 4.5 mmol/L (ref 3.5–5.1)
Sodium: 134 mmol/L — ABNORMAL LOW (ref 135–145)
Sodium: 135 mmol/L (ref 135–145)

## 2018-03-26 LAB — PULMONARY FUNCTION TEST
DL/VA % pred: 90 %
DL/VA: 4.65 ml/min/mmHg/L
DLCO cor % pred: 58 %
DLCO cor: 16.51 ml/min/mmHg
DLCO unc % pred: 53 %
DLCO unc: 15.02 ml/min/mmHg
FEF 25-75 Post: 1.8 L/sec
FEF 25-75 Pre: 1.15 L/sec
FEF2575-%Change-Post: 56 %
FEF2575-%Pred-Post: 51 %
FEF2575-%Pred-Pre: 33 %
FEV1-%Change-Post: 33 %
FEV1-%Pred-Post: 56 %
FEV1-%Pred-Pre: 42 %
FEV1-Post: 1.93 L
FEV1-Pre: 1.45 L
FEV1FVC-%Change-Post: 23 %
FEV1FVC-%Pred-Pre: 70 %
FEV6-%Change-Post: 0 %
FEV6-%Pred-Post: 60 %
FEV6-%Pred-Pre: 60 %
FEV6-Post: 2.47 L
FEV6-Pre: 2.46 L
FEV6FVC-%Pred-Post: 101 %
FEV6FVC-%Pred-Pre: 101 %
FVC-%Change-Post: 8 %
FVC-%Pred-Post: 64 %
FVC-%Pred-Pre: 59 %
FVC-Post: 2.67 L
FVC-Pre: 2.46 L
Post FEV1/FVC ratio: 72 %
Post FEV6/FVC ratio: 100 %
Pre FEV1/FVC ratio: 59 %
Pre FEV6/FVC Ratio: 100 %
RV % pred: 155 %
RV: 2.56 L
TLC % pred: 88 %
TLC: 4.85 L

## 2018-03-26 LAB — CULTURE, BLOOD (ROUTINE X 2)
Culture: NO GROWTH
Culture: NO GROWTH
Special Requests: ADEQUATE
Special Requests: ADEQUATE

## 2018-03-26 MED ORDER — FLEET ENEMA 7-19 GM/118ML RE ENEM
1.0000 | ENEMA | Freq: Once | RECTAL | Status: AC
  Administered 2018-03-26: 1 via RECTAL
  Filled 2018-03-26: qty 1

## 2018-03-26 MED ORDER — SODIUM CHLORIDE 0.9% FLUSH
10.0000 mL | Freq: Two times a day (BID) | INTRAVENOUS | Status: DC
  Administered 2018-03-26: 10 mL
  Administered 2018-03-26: 20 mL
  Administered 2018-03-27 – 2018-03-28 (×4): 10 mL

## 2018-03-26 MED ORDER — SODIUM CHLORIDE 0.9% FLUSH: 10.0000 mL | INTRAVENOUS | Status: DC | PRN

## 2018-03-26 MED ORDER — ALBUTEROL SULFATE (2.5 MG/3ML) 0.083% IN NEBU
2.5000 mg | INHALATION_SOLUTION | Freq: Once | RESPIRATORY_TRACT | Status: AC
  Administered 2018-03-26: 2.5 mg via RESPIRATORY_TRACT

## 2018-03-26 MED ORDER — KETOROLAC TROMETHAMINE 60 MG/2ML IM SOLN
30.0000 mg | Freq: Four times a day (QID) | INTRAMUSCULAR | Status: DC | PRN
  Administered 2018-03-26: 30 mg via INTRAMUSCULAR
  Filled 2018-03-26 (×2): qty 2

## 2018-03-26 MED ORDER — KETOROLAC TROMETHAMINE 60 MG/2ML IM SOLN
60.0000 mg | Freq: Once | INTRAMUSCULAR | Status: AC
  Administered 2018-03-26: 60 mg via INTRAMUSCULAR
  Filled 2018-03-26 (×2): qty 2

## 2018-03-26 NOTE — Progress Notes (Signed)
Progress Note  Patient Name: Tiffany Salazar Date of Encounter: 03/26/2018  Primary Cardiologist: Chilton Si, MD   Subjective   Complains of frequent lab draws and L abdominal pain.   Inpatient Medications    Scheduled Meds: . buprenorphine-naloxone  1 tablet Sublingual Daily  . busPIRone  15 mg Oral BID  . dicyclomine  10 mg Oral TID AC  . docusate sodium  100 mg Oral Daily  . multivitamin with minerals  1 tablet Oral Daily  . nicotine  14 mg Transdermal Daily  . pantoprazole  40 mg Oral Daily  . polyethylene glycol  17 g Oral Daily  . senna  1 tablet Oral BID  . sulfamethoxazole-trimethoprim  1 tablet Oral Q12H   Continuous Infusions: . DAPTOmycin (CUBICIN)  IV Stopped (03/26/18 0700)  . DAPTOmycin (CUBICIN)  IV    . lactated ringers Stopped (03/23/18 1501)   PRN Meds: acetaminophen **OR** acetaminophen, diphenhydrAMINE, gi cocktail, MUSCLE RUB, ondansetron **OR** ondansetron (ZOFRAN) IV, traMADol, traZODone   Vital Signs    Vitals:   03/25/18 2314 03/26/18 0350 03/26/18 0737 03/26/18 0746  BP: 103/74 100/70 92/61 (!) 96/52  Pulse:   94 91  Resp: 14 17 19 19   Temp: 98.7 F (37.1 C) 97.9 F (36.6 C) 98.7 F (37.1 C) 98.6 F (37 C)  TempSrc: Oral Oral Oral Oral  SpO2:   95% 96%  Weight:      Height:        Intake/Output Summary (Last 24 hours) at 03/26/2018 0955 Last data filed at 03/25/2018 2000 Gross per 24 hour  Intake 460 ml  Output -  Net 460 ml   Filed Weights   03/20/18 2119 03/23/18 1336  Weight: 150 lb (68 kg) 148 lb (67.1 kg)    Telemetry    Sinus rhythm, sinus tachycardia.  No events - Personally Reviewed  ECG    03/23/18: Sinus rhythm rate 74 bpm.  - Personally Reviewed  Physical Exam   VS:  BP (!) 96/52 (BP Location: Right Wrist)   Pulse 91   Temp 98.6 F (37 C) (Oral)   Resp 19   Ht 5\' 7"  (1.702 m)   Wt 148 lb (67.1 kg)   LMP 03/15/2018   SpO2 96%   BMI 23.18 kg/m  , BMI Body mass index is 23.18 kg/m. GENERAL:   Chronically ill-appearing HEENT: Pupils equal round and reactive, fundi not visualized, oral mucosa unremarkable NECK:  No jugular venous distention, waveform within normal limits, carotid upstroke brisk and symmetric, no bruits LUNGS:  Clear to auscultation bilaterally HEART:  RRR.  PMI not displaced or sustained,S1 and S2 within normal limits, no S3, no S4, no clicks, no rubs, II/VI systolic murmur at LLSB. ABD:  Flat, positive bowel sounds normal in frequency in pitch, no bruits, no rebound, no guarding, no midline pulsatile mass, no hepatomegaly, no splenomegaly EXT:  2 plus pulses throughout, no edema, no cyanosis no clubbing SKIN:  Diffuse desquamation NEURO:  Cranial nerves II through XII grossly intact, motor grossly intact throughout [facility]:  Cognitively intact, oriented to person place and time   Labs    Chemistry Recent Labs  Lab 03/20/18 1828 03/22/18 1346  03/24/18 1144 03/25/18 0644 03/26/18 0408  NA 140 140   < > 135 132* 134*  K 4.3 4.3   < > 5.2* 4.7 5.4*  CL 106 103   < > 100 97* 98  CO2 25 27   < > 26 23 25   GLUCOSE  99 117*   < > 90 107* 123*  BUN 19 23*   < > 17 16 22*  CREATININE 0.99 1.11*   < > 1.13* 1.11* 1.88*  CALCIUM 8.2* 8.7*   < > 8.2* 8.6* 8.7*  PROT 7.3 7.5  --   --   --   --   ALBUMIN 2.2* 2.6*  --   --   --   --   AST 71* 52*  --   --   --   --   ALT 55* 73*  --   --   --   --   ALKPHOS 68 80  --   --   --   --   BILITOT 0.4 0.6  --   --   --   --   GFRNONAA >60 >60   < > >60 >60 34*  GFRAA >60 >60   < > >60 >60 39*  ANIONGAP 9 10   < > 9 12 11    < > = values in this interval not displayed.     Hematology Recent Labs  Lab 03/23/18 0852 03/24/18 1019 03/25/18 0515  WBC 19.5* 20.3* 15.5*  RBC 3.63* 3.38* 3.59*  HGB 10.8* 10.2* 10.8*  HCT 33.7* 31.4* 33.3*  MCV 92.8 92.9 92.8  MCH 29.8 30.2 30.1  MCHC 32.0 32.5 32.4  RDW 15.6* 15.4 15.3  PLT 614* 557* 536*    Cardiac Enzymes Recent Labs  Lab 03/25/18 1919  TROPONINI <0.03    No results for input(s): TROPIPOC in the last 168 hours.   BNPNo results for input(s): BNP, PROBNP in the last 168 hours.   DDimer No results for input(s): DDIMER in the last 168 hours.   Radiology    Dg Orthopantogram  Result Date: 03/24/2018 CLINICAL DATA:  37 year old female with endocarditis. Evaluate dentition. EXAMTresa Endo COMPARISON:  12/16/2017 CT FINDINGS: There is equivocal lucency along the lateral root of tooth #3. No other periapical lucencies identified. No gross dental caries noted. IMPRESSION: Equivocal lucency/periapical abscess along the lateral root of tooth #3 Electronically Signed   By: Harmon Pier M.D.   On: 03/24/2018 13:52   Ct Angio Chest Pe W Or Wo Contrast  Result Date: 03/24/2018 CLINICAL DATA:  Endocarditis with septic pulmonary emboli. Dyspnea. Patient reports preop for cardiac valve replacement. EXAM: CT ANGIOGRAPHY CHEST WITH CONTRAST TECHNIQUE: Multidetector CT imaging of the chest was performed using the standard protocol during bolus administration of intravenous contrast. Multiplanar CT image reconstructions and MIPs were obtained to evaluate the vascular anatomy. CONTRAST:  ISOVUE-370 IOPAMIDOL (ISOVUE-370) INJECTION 76% COMPARISON:  Chest radiograph 03/20/2018 FINDINGS: Cardiovascular: There are no filling defects within the pulmonary arteries to suggest pulmonary embolus. Thoracic aorta is normal in caliber. No aortic dissection. The heart is normal in size. No pericardial effusion. Mediastinum/Nodes: Small mediastinal nodes not enlarged by size criteria. Prominent right hilar node measures 10 mm. Prominent left axillary node measuring 10 mm short axis. Small right axillary nodes, partially obscured by dense IV contrast causing streak artifact. Esophagus is decompressed. No thyroid nodule. Lungs/Pleura: Multifocal bilateral cavitary and irregular nodules consistent with septic emboli, greater in the right lung. Some of these cavitary  lesions have surrounding parenchymal ground-glass opacities that may represent hemorrhage. Small pleural effusions, right greater than left. Trachea and mainstem bronchi are patent. Upper Abdomen: No acute abnormality. Musculoskeletal: Mild remote superior endplate L1 compression fracture. No acute osseous abnormality. Review of the MIP images confirms the above findings. IMPRESSION: 1. Multiple  bilateral septic emboli.  Small pleural effusions. 2. No pulmonary embolus. Electronically Signed   By: Rubye OaksMelanie  Ehinger M.D.   On: 03/24/2018 23:00    Cardiac Studies   TEE 03/23/18:  Study Conclusions  - Left ventricle: The cavity size was normal. Wall thickness was   normal. Systolic function was normal. The estimated ejection   fraction was in the range of 55% to 60%. Wall motion was normal;   there were no regional wall motion abnormalities. - Aortic valve: No evidence of vegetation. - Mitral valve: No evidence of vegetation. - Left atrium: No evidence of thrombus in the atrial cavity or   appendage. - Right atrium: No evidence of thrombus in the atrial cavity or   appendage. - Atrial septum: No defect or patent foramen ovale was identified. - Tricuspid valve: Large 0.5 x 1 cm vegetations of the septal and   posterior leafelts (seen in 2D and 3D views) with associated   severe, wide-open TR. Peak RV-RA gradient (S): 88 mm Hg.   Regurgitant VTI: 88 cm. - Pulmonic valve: No evidence of vegetation.  Impressions:  - 2 large mobile vegetations of the TV with severe TR and high   embolic potential. Consider CT surgical consult for debridement   and TV replacement.   ECHO: 03/10/2018: Summary:  -Clinical question: Sepsis  -Left ventricle: Size was normal. Systolic function was normal. Ejection  fraction was estimated in the range of 55 % to 60 %. There were no regional  wall motion abnormalities. Left ventricular diastolic function parameters were normal.  -Mitral valve: There  was no evidence for vegetation.  -Right ventricle: The size was normal. Systolic function was normal.  Systolic pressure was within the normal range. Estimated peak pressure was at  least 24 mmHg. There was a mobile mass in the ventricular cavity. It may  represent a vegetation.  -Tricuspid valve: There was trace regurgitation. There was echogenic mobile  mass on tricuspid valve leaflets and also in the right ventricular chamber  consistent with vegetations .  Pulmonary veins: Not well visualized.  Right ventricle: The size was normal. Systolic function was normal. Wall  thickness was normal. There was a mobile mass in the ventricular cavity. It  may represent a vegetation. Doppler: Systolic pressure was within the normal  range. Estimated peak pressure was at least 24 mmHg.  Pulmonic valve: Not well visualized.  Tricuspid valve: The valve structure was normal. There was normal leaflet  separation. There was echogenic mobile mass on tricuspid valve leaflets and  also in the right ventricular chamber consistent with vegetations . Doppler:  There was no evidence for tricuspid stenosis. There was trace regurgitation.  Right atrium: Size was normal.  Systemic veins: IVC: The inferior vena cava was normal in size and course.  Respirophasic changes were normal.   Patient Profile     37 y.o. female with a history of IV drug abuse here with MRSA tricuspid valve endocarditis and septic pulmonary emboli.  Assessment & Plan    # Tricuspid valve endocarditis: # Septic pulmonary emboli: Outside hospital blood cultures were positive for MRSA.  Cultures here NGTD.  Large TV vegetations noted on TEE with severe TR.  Given this and her septic pulmonary emboli she is unlikely to clear the infection with antibiotics alone.  Plan for TV repair/replacement on 8/5.  Antibiotics per ID.  # IVDA: # Polysubstance abuse:  Patient eager to quit.  Started suboxone this hospitalization and she has  a nicotine patch.  # ?  AKI: Creatinine today was 1.88.  There have been a lot of problems with her labs clotting.  She is getting a PICC.  Repeat BMP after PICC is placed.  If still elevated would get imaging of her kidneys given her L sided pain.  She doesn't have any known L sided valvular disease and no PFO noted on TEE.  Unlikely to be embolic disease.     For questions or updates, please contact [facility] Please consult [email or web address] for contact info under Cardiology/STEMI.      Signed, Chilton Si, MD  03/26/2018, 9:55 AM

## 2018-03-26 NOTE — Progress Notes (Signed)
Family Medicine Teaching Service Daily Progress Note Intern Pager: [phone]  Patient name: Tiffany Salazar [name] Medical record number: [id number] Date of birth: 01/09/1981 Age: 37 y.o. Gender: female  Primary Care Provider: Red, [facility], MD  Code Status: full Consultations: cards, cvts, chaplan Admission date:7/27  Pt Overview and Major Events to Date:  Admitted for treatment of endocarditis with IV abx x6 weeks plus TV replacement 8/5  Subjective: Patient sitting up and energetic today.  She states she had increased shoulder chest pain overnight which is better this morning.  Improved with Toradol injection.  Her main concern is constipation.  She states she has abdominal discomfort discouraging her from walking or moving.  She denies nausea and vomiting.  She has had excessive gas from her bowels.  Sat on toilet and passed gas and suppository but she has had no bowel movement in 6 days. patient states that BuSpar medication helped with her anxiety.  She still states that she is frustrated with always getting her blood drawn.   Objective: Temp:  [97.9 F (36.6 C)-99.6 F (37.6 C)] 97.9 F (36.6 C) (08/02 0350) Pulse Rate:  [102-118] 102 (08/01 1634) Resp:  [14-24] 17 (08/02 0350) BP: (100-133)/(70-111) 100/70 (08/02 0350) SpO2:  [89 %-94 %] 89 % (08/01 1634)  Physical Exam: General: NAD.  Cardiovascular: Rhythm Respiratory: CTA BL Abdomen: Scaphoid, nondistended no masses.  Diffusely tender to palpation.  Mild bowel sounds auscultated in all 4 quadrants.  Healing macular rash diffusely present Extremities: No lower extremity swelling.  Assessment and Plan:  Endocarditis -stable -continue daptomycin (7/27-9/7) -continue Bactrim (7/27-9/7) -f/u cardiology -f/u CVTS  -TS replacement 8/5 -f/u dental -CT chest  - Multiple bilateral septic emboli. Small pleural effusions.             - No pulmonary embolus. -will restart IVF as needed  Pulmonary septic emboli: Stable on  RA Secondary to endocarditis/bacteremia. Patient is s/p thoracenteses for parapneumonic effusions. Chest x-ray evidence of minimal bilateral pleural effusions.  -Management with antibiotics above -Repeat chest x-ray (8/2) showed no changes  MRSA bacteremia -Management above -Repeat blood cultures on (7/28) showedNGTD  Constipation -colace daily -miralax PRN - senna BID -GI cocktail PRN -dicyclomine TID -suppository x1 -Enema x1  Anxiety associated with hospital stay -chaplain consult -buspar BID  IV drug use/Polysubstance abuse -cont Suboxone 8-2 mg daily -nicotine patch 14mg  -tylenol prn for pain -tramadol 50mg  q6 PRN for pain  Rash: Improved Maculopapular. Thought to be secondary to IV antibiotics. Treated with steroids and benadryl.  -Continuebenadryl prn  Anemia: Stable -Unable to draw labs later today -PICC line ordered  Leukocytosis WBC reduced to 15.5this AM. Afebrile. -cont to monitor with CBC -cont antibiotics  Sleep disorder:Stable Sleeping well on trazodone. -cont trazadone 50mg  prn  Fluids/diet: heart healthy PPx: SCDs  Disposition: stable   Laboratory: Recent Labs  Lab 03/23/18 0852 03/24/18 1019 03/25/18 0515  WBC 19.5* 20.3* 15.5*  HGB 10.8* 10.2* 10.8*  HCT 33.7* 31.4* 33.3*  PLT 614* 557* 536*   Recent Labs  Lab 03/20/18 1828 03/22/18 1346  03/24/18 1144 03/25/18 0644 03/26/18 0408  NA 140 140   < > 135 132* 134*  K 4.3 4.3   < > 5.2* 4.7 5.4*  CL 106 103   < > 100 97* 98  CO2 25 27   < > 26 23 25   BUN 19 23*   < > 17 16 22*  CREATININE 0.99 1.11*   < > 1.13* 1.11* 1.88*  CALCIUM 8.2*  8.7*   < > 8.2* 8.6* 8.7*  PROT 7.3 7.5  --   --   --   --   BILITOT 0.4 0.6  --   --   --   --   ALKPHOS 68 80  --   --   --   --   ALT 55* 73*  --   --   --   --   AST 71* 52*  --   --   --   --   GLUCOSE 99 117*   < > 90 107* 123*   < > = values in this interval not displayed.    Imaging/Diagnostic Tests:  Chest  X-ray FINDINGS: Cardiac shadows within normal limits. Patchy densities are again identified throughout both lungs with some degree of cavitation similar to that seen on the prior exam. No new focal infiltrate or effusion is seen. No bony abnormality is noted. IMPRESSION: Stable cavitary lesions bilaterally.  Pulmonary function test FEV1 pre-1.45 FEV1 post 1.93 FEV1/FVC pre-59 FEV1 FVC post 72  Procedures: PICC line placed 03/26/2018   Leeroy Bock, DO 03/26/2018, 6:40 AM PGY-1, [facility] Family Medicine FPTS Intern pager: [phone], text pages welcome

## 2018-03-26 NOTE — Progress Notes (Signed)
CARDIAC REHAB PHASE I   PRE:  Rate/Rhythm: 95 SR    BP: sitting 101/65    SaO2: 95 RA  MODE:  Ambulation: 1200 ft   POST:  Rate/Rhythm: 126 ST    BP: sitting 105/85     SaO2: 95 RA  Pt moving well. No SOB, HR up but stable. Discussed sternal precautions, mobility, booklet/handouts, and IS use. She is only able to inspire 500 mL on IS right now. Highly encouraged her to work on IS this weekend in prep for surgery. She is agreeable. Also encouraged more walking. She was receptive.  [id number]   Harriet MassonRandi Kristan Nimah Uphoff CES, ACSM 03/26/2018 11:20 AM

## 2018-03-26 NOTE — Progress Notes (Signed)
Pharmacy Antibiotic Note  Tiffany Salazar is a [age] y.o. female admitted on 03/20/2018 with endocarditis.  Pharmacy has been consulted for daptomycin dosing. [place] in [place]17 to 7-26 for tricuspid endocarditis, MRSA bacteremia and pulmonary septic emboli. On 7/14 she developed a rash that was thought to be caused by Vancomycin and was changed to Daptomycin at that time.  She [facility] on 7/26 [facility]   Patient currently continues on Daptomycin for known tricuspid valve endocarditis. SCr is up slightly today to 1.88 and patient is afebrile. Plan for valve repair/replacement on Monday 8/5. Continue Dapto/Bactrim for pulmonary emboli.        Plan: Daptomycin 500 mg IV Q 24 hours (~7.5 mg/kg) daily  Patient given 1 X dose of 460 mg on 8/1 due to supply in pharmacy  Weekly CK on Thursdays   Height: 5\' 7"  (170.2 cm) Weight: 148 lb (67.1 kg) IBW/kg (Calculated) : 61.6  Temp (24hrs), Avg:98.7 F (37.1 C), Min:97.9 F (36.6 C), Max:99.6 F (37.6 C)  Recent Labs  Lab 03/20/18 1828 03/20/18 1833 03/22/18 1346 03/23/18 0852 03/24/18 1019 03/24/18 1144 03/25/18 0515 03/25/18 0644 03/26/18 0408  WBC 17.4*  --  15.9* 19.5* 20.3*  --  15.5*  --   --   CREATININE 0.99  --  1.11* 1.14*  --  1.13*  --  1.11* 1.88*  LATICACIDVEN  --  1.37  --   --   --   --   --   --   --     Estimated Creatinine Clearance: 40.6 mL/min (A) (by C-G formula based on SCr of 1.88 mg/dL (H)).    Allergies  Allergen Reactions  . Gentamicin Rash  . Vancomycin Rash    Antimicrobials this admission: Daptomycin 7/27 >>  Bacrtim 7/28 >>    Microbiology results: 7/28 BCx: ngtd   Thank you for allowing pharmacy to be a part of this patient's care.  Sharin MonsEmily Sinclair, PharmD, BCPS Antimicrobial Stewardship Clinical Pharmacist [phone] 03/26/2018 3:34 PM

## 2018-03-26 NOTE — Progress Notes (Signed)
Peripherally Inserted Central Catheter/Midline Placement  The IV Nurse has discussed with the patient and/or persons authorized to consent for the patient, the purpose of this procedure and the potential benefits and risks involved with this procedure.  The benefits include less needle sticks, lab draws from the catheter, and the patient may be discharged home with the catheter. Risks include, but not limited to, infection, bleeding, blood clot (thrombus formation), and puncture of an artery; nerve damage and irregular heartbeat and possibility to perform a PICC exchange if needed/ordered by physician.  Alternatives to this procedure were also discussed.  Bard Power PICC patient education guide, fact sheet on infection prevention and patient information card has been provided to patient /or left at bedside.    PICC/Midline Placement Documentation  PICC Double Lumen 03/26/18 PICC Right Brachial 38 cm 0 cm (Active)  Indication for Insertion or Continuance of Line Limited venous access - need for IV therapy >5 days (PICC only) 03/26/2018 12:38 PM  Exposed Catheter (cm) 0 cm 03/26/2018 12:38 PM  Site Assessment Clean;Dry;Intact 03/26/2018 12:38 PM  Lumen #1 Status Flushed;Blood return noted 03/26/2018 12:38 PM  Lumen #2 Status Flushed;Blood return noted 03/26/2018 12:38 PM  Dressing Type Transparent 03/26/2018 12:38 PM  Dressing Status Clean;Dry;Intact;Antimicrobial disc in place 03/26/2018 12:38 PM  Dressing Intervention New dressing 03/26/2018 12:38 PM  Dressing Change Due 04/02/18 03/26/2018 12:38 PM       Jacquilyn Seldon Albarece 03/26/2018, 12:40 PM 

## 2018-03-26 NOTE — Progress Notes (Signed)
Peripherally Inserted Central Catheter/Midline Placement  The IV Nurse has discussed with the patient and/or persons authorized to consent for the patient, the purpose of this procedure and the potential benefits and risks involved with this procedure.  The benefits include less needle sticks, lab draws from the catheter, and the patient may be discharged home with the catheter. Risks include, but not limited to, infection, bleeding, blood clot (thrombus formation), and puncture of an artery; nerve damage and irregular heartbeat and possibility to perform a PICC exchange if needed/ordered by physician.  Alternatives to this procedure were also discussed.  Bard Power PICC patient education guide, fact sheet on infection prevention and patient information card has been provided to patient /or left at bedside.    PICC/Midline Placement Documentation  PICC Double Lumen 03/26/18 PICC Right Brachial 38 cm 0 cm (Active)  Indication for Insertion or Continuance of Line Limited venous access - need for IV therapy >5 days (PICC only) 03/26/2018 12:38 PM  Exposed Catheter (cm) 0 cm 03/26/2018 12:38 PM  Site Assessment Clean;Dry;Intact 03/26/2018 12:38 PM  Lumen #1 Status Flushed;Blood return noted 03/26/2018 12:38 PM  Lumen #2 Status Flushed;Blood return noted 03/26/2018 12:38 PM  Dressing Type Transparent 03/26/2018 12:38 PM  Dressing Status Clean;Dry;Intact;Antimicrobial disc in place 03/26/2018 12:38 PM  Dressing Intervention New dressing 03/26/2018 12:38 PM  Dressing Change Due 04/02/18 03/26/2018 12:38 PM       Reginia FortsLumban, Gael Delude Albarece 03/26/2018, 12:40 PM

## 2018-03-27 DIAGNOSIS — I361 Nonrheumatic tricuspid (valve) insufficiency: Secondary | ICD-10-CM

## 2018-03-27 DIAGNOSIS — R079 Chest pain, unspecified: Secondary | ICD-10-CM

## 2018-03-27 DIAGNOSIS — I071 Rheumatic tricuspid insufficiency: Secondary | ICD-10-CM

## 2018-03-27 DIAGNOSIS — I339 Acute and subacute endocarditis, unspecified: Secondary | ICD-10-CM

## 2018-03-27 LAB — CBC
HCT: 29.6 % — ABNORMAL LOW (ref 36.0–46.0)
HCT: 27.3 % — ABNORMAL LOW (ref 36.0–46.0)
Hemoglobin: 9.1 g/dL — ABNORMAL LOW (ref 12.0–15.0)
Hemoglobin: 8.5 g/dL — ABNORMAL LOW (ref 12.0–15.0)
MCH: 29.5 pg (ref 26.0–34.0)
MCH: 30.1 pg (ref 26.0–34.0)
MCHC: 30.7 g/dL (ref 30.0–36.0)
MCHC: 31.1 g/dL (ref 30.0–36.0)
MCV: 96.1 fL (ref 78.0–100.0)
MCV: 96.8 fL (ref 78.0–100.0)
Platelets: 382 10*3/uL (ref 150–400)
Platelets: 344 10*3/uL (ref 150–400)
RBC: 3.08 MIL/uL — ABNORMAL LOW (ref 3.87–5.11)
RBC: 2.82 MIL/uL — ABNORMAL LOW (ref 3.87–5.11)
RDW: 15.3 % (ref 11.5–15.5)
RDW: 15.3 % (ref 11.5–15.5)
WBC: 12.2 10*3/uL — ABNORMAL HIGH (ref 4.0–10.5)
WBC: 12 10*3/uL — ABNORMAL HIGH (ref 4.0–10.5)

## 2018-03-27 LAB — BASIC METABOLIC PANEL
Anion gap: 9 (ref 5–15)
Anion gap: 7 (ref 5–15)
BUN: 38 mg/dL — ABNORMAL HIGH (ref 6–20)
BUN: 34 mg/dL — ABNORMAL HIGH (ref 6–20)
CO2: 28 mmol/L (ref 22–32)
CO2: 26 mmol/L (ref 22–32)
Calcium: 8.3 mg/dL — ABNORMAL LOW (ref 8.9–10.3)
Calcium: 8 mg/dL — ABNORMAL LOW (ref 8.9–10.3)
Chloride: 100 mmol/L (ref 98–111)
Chloride: 103 mmol/L (ref 98–111)
Creatinine, Ser: 2.4 mg/dL — ABNORMAL HIGH (ref 0.44–1.00)
Creatinine, Ser: 2.05 mg/dL — ABNORMAL HIGH (ref 0.44–1.00)
GFR calc Af Amer: 29 mL/min — ABNORMAL LOW (ref >60)
GFR calc Af Amer: 35 mL/min — ABNORMAL LOW (ref >60)
GFR calc non Af Amer: 25 mL/min — ABNORMAL LOW (ref >60)
GFR calc non Af Amer: 30 mL/min — ABNORMAL LOW (ref >60)
Glucose, Bld: 99 mg/dL (ref 70–99)
Glucose, Bld: 99 mg/dL (ref 70–99)
Potassium: 5 mmol/L (ref 3.5–5.1)
Potassium: 5.1 mmol/L (ref 3.5–5.1)
Sodium: 137 mmol/L (ref 135–145)
Sodium: 136 mmol/L (ref 135–145)

## 2018-03-27 LAB — URINALYSIS, ROUTINE W REFLEX MICROSCOPIC
Bilirubin Urine: NEGATIVE
Glucose, UA: NEGATIVE mg/dL
Ketones, ur: NEGATIVE mg/dL
Nitrite: NEGATIVE
Protein, ur: NEGATIVE mg/dL
Specific Gravity, Urine: 1.021 (ref 1.005–1.030)
pH: 5 (ref 5.0–8.0)

## 2018-03-27 LAB — SODIUM, URINE, RANDOM: Sodium, Ur: 106 mmol/L

## 2018-03-27 LAB — CREATININE, URINE, RANDOM: Creatinine, Urine: 130.88 mg/dL

## 2018-03-27 MED ORDER — SODIUM CHLORIDE 0.9 % IV BOLUS
1000.0000 mL | Freq: Once | INTRAVENOUS | Status: AC
  Administered 2018-03-27: 1000 mL via INTRAVENOUS

## 2018-03-27 MED ORDER — SODIUM CHLORIDE 0.9 % IV SOLN
INTRAVENOUS | Status: DC
  Administered 2018-03-27: 12:00:00 via INTRAVENOUS
  Administered 2018-03-28: 1000 mL via INTRAVENOUS
  Administered 2018-03-28: 11:00:00 via INTRAVENOUS
  Administered 2018-03-28: 100 mL via INTRAVENOUS
  Administered 2018-03-29: 11:00:00 via INTRAVENOUS

## 2018-03-27 NOTE — Progress Notes (Signed)
CARDIAC REHAB PHASE I   PRE:  Rate/Rhythm: 89 SR  BP:  Sitting: 108/69   MODE:  Ambulation: 1200 ft   POST:  Rate/Rhythm: 106 ST  BP:  Sitting: 101/66     SaO2: 97% RA  1520-1535 Pt ambulated 1200 ft. Tolerated well. Steady gait.  Encouraged patient to continue to walk.    Nikki Domverett, Margretta Zamorano G, RN 03/27/2018 3:39 PM

## 2018-03-27 NOTE — Progress Notes (Signed)
Progress Note  Patient Name: Tiffany Salazar [name] Date of Encounter: 03/27/2018  Primary Cardiologist: Chilton Siiffany Graettinger, MD   Subjective   Worried about scar from surgery   Inpatient Medications    Scheduled Meds: . buprenorphine-naloxone  1 tablet Sublingual Daily  . busPIRone  15 mg Oral BID  . dicyclomine  10 mg Oral TID AC  . docusate sodium  100 mg Oral Daily  . multivitamin with minerals  1 tablet Oral Daily  . nicotine  14 mg Transdermal Daily  . pantoprazole  40 mg Oral Daily  . polyethylene glycol  17 g Oral Daily  . senna  1 tablet Oral BID  . sodium chloride flush  10-40 mL Intracatheter Q12H  . sulfamethoxazole-trimethoprim  1 tablet Oral Q12H   Continuous Infusions: . DAPTOmycin (CUBICIN)  IV Stopped (03/27/18 0700)  . lactated ringers Stopped (03/23/18 1501)   PRN Meds: acetaminophen **OR** acetaminophen, diphenhydrAMINE, gi cocktail, MUSCLE RUB, ondansetron **OR** ondansetron (ZOFRAN) IV, sodium chloride flush, traMADol, traZODone   Vital Signs    Vitals:   03/26/18 2100 03/26/18 2311 03/27/18 0327 03/27/18 0818  BP:  (!) 84/59 (!) 82/48 (!) 80/60  Pulse:  89 76 88  Resp: (!) 23 16 12 13   Temp:  98.6 F (37 C) 97.6 F (36.4 C) 97.7 F (36.5 C)  TempSrc:  Oral Oral Oral  SpO2:  96% 97% 96%  Weight:      Height:        Intake/Output Summary (Last 24 hours) at 03/27/2018 1030 Last data filed at 03/27/2018 1017 Gross per 24 hour  Intake 20 ml  Output -  Net 20 ml   Filed Weights   03/20/18 2119 03/23/18 1336  Weight: 150 lb (68 kg) 148 lb (67.1 kg)    Telemetry    Sinus rhythm, sinus tachycardia.  No events - Personally Reviewed  ECG    03/23/18: Sinus rhythm rate 74 bpm.  - Personally Reviewed  Physical Exam   VS:  BP (!) 80/60 (BP Location: Left Arm)   Pulse 88   Temp 97.7 F (36.5 C) (Oral)   Resp 13   Ht 5\' 7"  (1.702 m)   Wt 148 lb (67.1 kg)   LMP 03/15/2018   SpO2 96%   BMI 23.18 kg/m  , BMI Body mass index is 23.18  kg/m. Affect appropriate Thin female  HEENT: normal Neck supple with no adenopathy JVP normal no bruits no thyromegaly Lungs clear with no wheezing and good diaphragmatic motion Heart:  S1/S2 TR  murmur, no rub, gallop or click PMI normal Abdomen: benighn, BS positve, no tenderness, no AAA no bruit.  No HSM or HJR Distal pulses intact with no bruits No edema Neuro non-focal Skin warm and dry No muscular weakness    Labs    Chemistry Recent Labs  Lab 03/20/18 1828 03/22/18 1346  03/26/18 0408 03/26/18 1640 03/27/18 0624  NA 140 140   < > 134* 135 137  K 4.3 4.3   < > 5.4* 4.5 5.0  CL 106 103   < > 98 98 100  CO2 25 27   < > 25 28 28   GLUCOSE 99 117*   < > 123* 140* 99  BUN 19 23*   < > 22* 24* 38*  CREATININE 0.99 1.11*   < > 1.88* 1.40* 2.40*  CALCIUM 8.2* 8.7*   < > 8.7* 8.4* 8.3*  PROT 7.3 7.5  --   --   --   --  ALBUMIN 2.2* 2.6*  --   --   --   --   AST 71* 52*  --   --   --   --   ALT 55* 73*  --   --   --   --   ALKPHOS 68 80  --   --   --   --   BILITOT 0.4 0.6  --   --   --   --   GFRNONAA >60 >60   < > 34* 48* 25*  GFRAA >60 >60   < > 39* 56* 29*  ANIONGAP 9 10   < > 11 9 9    < > = values in this interval not displayed.     Hematology Recent Labs  Lab 03/24/18 1019 03/25/18 0515 03/27/18 0624  WBC 20.3* 15.5* 12.2*  RBC 3.38* 3.59* 3.08*  HGB 10.2* 10.8* 9.1*  HCT 31.4* 33.3* 29.6*  MCV 92.9 92.8 96.1  MCH 30.2 30.1 29.5  MCHC 32.5 32.4 30.7  RDW 15.4 15.3 15.3  PLT 557* 536* 382    Cardiac Enzymes Recent Labs  Lab 03/25/18 1919  TROPONINI <0.03   No results for input(s): TROPIPOC in the last 168 hours.    Radiology    Dg Chest Port 1 View  Result Date: 03/26/2018 CLINICAL DATA:  Chest pain EXAM: PORTABLE CHEST 1 VIEW COMPARISON:  03/24/2018 CT of the chest FINDINGS: Cardiac shadows within normal limits. Patchy densities are again identified throughout both lungs with some degree of cavitation similar to that seen on the prior  exam. No new focal infiltrate or effusion is seen. No bony abnormality is noted. IMPRESSION: Stable cavitary lesions bilaterally. Electronically Signed   By: Alcide Clever M.D.   On: 03/26/2018 11:34   [place] Ekg Site Rite  Result Date: 03/26/2018 If Site Rite image not attached, placement could not be confirmed due to current cardiac rhythm.   Cardiac Studies   TEE 03/23/18:  Study Conclusions  - Left ventricle: The cavity size was normal. Wall thickness was   normal. Systolic function was normal. The estimated ejection   fraction was in the range of 55% to 60%. Wall motion was normal;   there were no regional wall motion abnormalities. - Aortic valve: No evidence of vegetation. - Mitral valve: No evidence of vegetation. - Left atrium: No evidence of thrombus in the atrial cavity or   appendage. - Right atrium: No evidence of thrombus in the atrial cavity or   appendage. - Atrial septum: No defect or patent foramen ovale was identified. - Tricuspid valve: Large 0.5 x 1 cm vegetations of the septal and   posterior leafelts (seen in 2D and 3D views) with associated   severe, wide-open TR. Peak RV-RA gradient (S): 88 mm Hg.   Regurgitant VTI: 88 cm. - Pulmonic valve: No evidence of vegetation.  Impressions:  - 2 large mobile vegetations of the TV with severe TR and high   embolic potential. Consider CT surgical consult for debridement   and TV replacement.   ECHO: 03/10/2018: Summary:  -Clinical question: Sepsis  -Left ventricle: Size was normal. Systolic function was normal. Ejection  fraction was estimated in the range of 55 % to 60 %. There were no regional  wall motion abnormalities. Left ventricular diastolic function parameters were normal.  -Mitral valve: There was no evidence for vegetation.  -Right ventricle: The size was normal. Systolic function was normal.  Systolic pressure was within the normal range. Estimated peak pressure  was at  least 24 mmHg.  There was a mobile mass in the ventricular cavity. It may  represent a vegetation.  -Tricuspid valve: There was trace regurgitation. There was echogenic mobile  mass on tricuspid valve leaflets and also in the right ventricular chamber  consistent with vegetations .  Pulmonary veins: Not well visualized.  Right ventricle: The size was normal. Systolic function was normal. Wall  thickness was normal. There was a mobile mass in the ventricular cavity. It  may represent a vegetation. Doppler: Systolic pressure was within the normal  range. Estimated peak pressure was at least 24 mmHg.  Pulmonic valve: Not well visualized.  Tricuspid valve: The valve structure was normal. There was normal leaflet  separation. There was echogenic mobile mass on tricuspid valve leaflets and  also in the right ventricular chamber consistent with vegetations . Doppler:  There was no evidence for tricuspid stenosis. There was trace regurgitation.  Right atrium: Size was normal.  Systemic veins: IVC: The inferior vena cava was normal in size and course.  Respirophasic changes were normal.   Patient Profile     37 y.o. female with a history of IV drug abuse here with MRSA tricuspid valve endocarditis and septic pulmonary emboli.  Assessment & Plan    # Tricuspid valve endocarditis: # Septic pulmonary emboli: Outside hospital blood cultures were positive for MRSA.  Cultures here NGTD.  Large TV vegetations noted on TEE with severe TR.  Given this and her septic pulmonary emboli she is unlikely to clear the infection with antibiotics alone.  Plan for TV repair/replacement on Monday .  Antibiotics per ID.  # IVDA: # Polysubstance abuse:  Patient eager to quit.  Started suboxone this hospitalization and she has a nicotine patch.  # ?AKI:  Cr up to 2.4 urinating ok will hydrate and recheck in am ? Does bactrim need to be held     For questions or updates, please contact [facility] Please consult  [email or web address] for contact info under Cardiology/STEMI.      Signed, Charlton Haws, MD  03/27/2018, 10:30 AM

## 2018-03-27 NOTE — Progress Notes (Addendum)
Family Medicine Teaching Service Daily Progress Note Intern Pager: [phone]  Patient name: Tiffany Salazar Medical record number: [id number] Date of birth: 08/11/1981 Age: 37 y.o. Gender: female  Primary Care Provider: Cory Munched, [facility] Team, MD Consultants: Cardiology, CVTS, ID Code Status: Full  Pt Overview and Major Events to Date:  [place]17-26 In [place] hosp to [place]ocarditis/MRSA bacteremia/PE/hemoptosis.  Reports neg reaction to vanc/zosyn and thoracentesis from PE. 7[facility], started on dapt/bactrim 7/29 [facility] - PICC placed 8/5 - planned TV replacement  Assessment and Plan: Tiffany Salazar is a 37yo F with PMH of IVDU who is admitted for MRSA bacteremia and TV endocarditis.  TV endocarditis Tricuspid valve with RV vegetation visualized on TEE 7/30 with septic emboli to lungs visualized on CT chest. Secondary to IVDU and MRSA bacteremia. Previous adverse reaction to Vanc/Zosyn. WBC responding to current abx regimen (17.4>20.3>>12.2), afebrile. - ID, Cardiology consulted   -cont Daptomycin (7/27- 9/7?) 6wks per ID  -cont Bactrim (7/27- 9/7?) 6wks per ID  - CVTS following, planning for TVR 8/5 - tylenol prn for fever  Pulmonary septic emboli, stable respiration on RA Secondary to endocarditis/bacteremia. Patient is s/p thoracenteses for parapneumonic effusions. Chest x-ray 7/27, 8/2 without evidence of pleural effusions. Lungs CTA this am. - Management above  AKI Received 30mg  toradol yesterday for L shoulder pain, most likely etiology although Cr slightly elevated throughout admission. If not improved with NS bolus, will consider renal imaging. - NS bolus 1L - recheck pm BMP - d/c PRN toradol  Chest/L Shoulder Pain Intermittently c/o L shoulder pain, not in pain at time of exam. Most likely referred pain from pulmonary septic emboli, with stable respirations on RA. Could also have  MSK component as improves with heating pad and toradol. Serial shoulder exams without evidence of joint instability or infection. No limitations with ROM or functional use. Chest pain reproducible with palpation, previous trop neg with EKG NSR.  - heating pad, muscle rub cream prn - tylenol, tramadol PRN - encourage OOB  MRSA bacteremia -Management above -Repeat blood cultures NG>48h  IV drug use  Polysubstance abuse   Currently with PICC line in place, will require hospitalization for duration of IV antibiotics. Remains on suboxone with desires to stay sober. Became very agitated and tearful when informed of discontinuation of toradol despite not in pain at time of exam and objective exams without limitations in mobility or appearance. Will continue to monitor for abusive potential and use pain control judiciously. - COWS - 11>3>2>0 overnight - cont Suboxone 8-2 mg daily. Will need to establish with suboxone provider outpatient. - nicotine path 14mg  - tylenol, tramadol prn for pain  Constipation  Patient reports long-standing h/o constipation, likely due to long term opioid addiction. States she usually goes about once every few weeks. Reports no BM for the past 3 weeks on admission. This am, endorsed small BM s/p fleet enema yesterday evening. Will continue to monitor. - bentyl TID - colace daily - miralax daily - senna BID  Anemia Hb 9.6>10.9>9.1 this am. Normocytic with unknown etiology. Has been stable throughout admission, although will 1 point drop this morning. No known blood loss, asymptomatic. Iron panel wnl. - Repeat pm CBC  Sleep disorder On admission, patient with difficulties sleeping and stated it was because she was "coming off all that stuff." Has improved with PRN trazodone although states she is still not getting good rest because "  people keep coming in to wake [her] up." - continue trazadone 50mg  prn  Hep C reactivity Noted positive Hep C Ab in 2016. Would  likely need to establish with hepatologist as an outpatient.  FEN/GI: regular, IV LR kvo PPx: SCDs  Disposition: continue inpatient management of TV endocarditis with IV antibiotics, planned TV replacement 8/5.  Subjective:  Patient currently not in pain. When informed of discontinuing PRN toradol due to AKI, she began crying stating, "how can y'all let me sit here like this in pain?" Calmed down with reassurance that her pain is being taken seriously and will treat as appropriate.  Objective: Temp:  [97.6 F (36.4 C)-98.8 F (37.1 C)] 97.6 F (36.4 C) (08/03 0327) Pulse Rate:  [76-105] 76 (08/03 0327) Resp:  [12-23] 12 (08/03 0327) BP: (82-113)/(48-68) 82/48 (08/03 0327) SpO2:  [82 %-97 %] 97 % (08/03 0327) Physical Exam: General: tearful on exam when informed of d/cing toradol. Awake, alert, oriented. Cardiovascular: RRR, no murmur appreciated. Respiratory: CTA, no wheezes/rales/rhonchi, appropriate saturation on RA. Abdomen: soft, NTND, normoactive BS. Extremities: warm and well perfused. L shoulder with full active ROM.  Laboratory: Recent Labs  Lab 03/24/18 1019 03/25/18 0515 03/27/18 0624  WBC 20.3* 15.5* 12.2*  HGB 10.2* 10.8* 9.1*  HCT 31.4* 33.3* 29.6*  PLT 557* 536* 382   Recent Labs  Lab 03/20/18 1828 03/22/18 1346  03/25/18 0644 03/26/18 0408 03/26/18 1640  NA 140 140   < > 132* 134* 135  K 4.3 4.3   < > 4.7 5.4* 4.5  CL 106 103   < > 97* 98 98  CO2 25 27   < > 23 25 28   BUN 19 23*   < > 16 22* 24*  CREATININE 0.99 1.11*   < > 1.11* 1.88* 1.40*  CALCIUM 8.2* 8.7*   < > 8.6* 8.7* 8.4*  PROT 7.3 7.5  --   --   --   --   BILITOT 0.4 0.6  --   --   --   --   ALKPHOS 68 80  --   --   --   --   ALT 55* 73*  --   --   --   --   AST 71* 52*  --   --   --   --   GLUCOSE 99 117*   < > 107* 123* 140*   < > = values in this interval not displayed.   TEE 7/30 - LVEF 55-60%. No WMA. Large TV vegetations with severe, wide-open TR.  Imaging/Diagnostic  Tests: Dg Chest Port 1 View  Result Date: 03/26/2018 CLINICAL DATA:  Chest pain EXAM: PORTABLE CHEST 1 VIEW COMPARISON:  03/24/2018 CT of the chest FINDINGS: Cardiac shadows within normal limits. Patchy densities are again identified throughout both lungs with some degree of cavitation similar to that seen on the prior exam. No new focal infiltrate or effusion is seen. No bony abnormality is noted. IMPRESSION: Stable cavitary lesions bilaterally. Electronically Signed   By: Alcide Clever M.D.   On: 03/26/2018 11:34   [place] Ekg Site Rite  Result Date: 03/26/2018 If Site Rite image not attached, placement could not be confirmed due to current cardiac rhythm.  Ellwood Dense, DO 03/27/2018, 7:00 AM PGY-2, [facility] FPTS Intern pager: [phone], text pages welcome

## 2018-03-27 NOTE — Progress Notes (Signed)
4 Days Post-Op Procedure(s) (LRB): TRANSESOPHAGEAL ECHOCARDIOGRAM (TEE) (N/A) Subjective: No further chest pain.  Objective: Vital signs in last 24 hours: Temp:  [97.6 F (36.4 C)-98.8 F (37.1 C)] 98.8 F (37.1 C) (08/03 1516) Pulse Rate:  [55-108] 108 (08/03 1516) Cardiac Rhythm: Normal sinus rhythm (08/03 1914) Resp:  [12-23] 14 (08/03 1516) BP: (80-108)/(48-69) 108/69 (08/03 1516) SpO2:  [82 %-97 %] 96 % (08/03 1516)  Hemodynamic parameters for last 24 hours:    Intake/Output from previous day: 08/02 0701 - 08/03 0700 In: 10 [I.V.:10] Out: -  Intake/Output this shift: No intake/output data recorded.  General appearance: alert and cooperative Heart: regular rate and rhythm, S1, S2 normal, no murmur, click, rub or gallop Lungs: few rhonchi  Lab Results: Recent Labs    03/27/18 0624 03/27/18 1637  WBC 12.2* 12.0*  HGB 9.1* 8.5*  HCT 29.6* 27.3*  PLT 382 344   BMET:  Recent Labs    03/27/18 0624 03/27/18 1637  NA 137 136  K 5.0 5.1  CL 100 103  CO2 28 26  GLUCOSE 99 99  BUN 38* 34*  CREATININE 2.40* 2.05*  CALCIUM 8.3* 8.0*    PT/INR: No results for input(s): LABPROT, INR in the last 72 hours. ABG No results found for: PHART, HCO3, TCO2, ACIDBASEDEF, O2SAT CBG (last 3)  No results for input(s): GLUCAP in the last 72 hours.  Assessment/Plan:  MRSA TV endocarditis with severe TR and septic pulmonary emboli.  She remains afebrile with WBC 12.0.   Creat has risen the last few days to peak of 2.4 this am. It was 1.1 on 8/1. Unclear why that is but could be due to Bactrim, some dehydration and then CTA of chest with contrast load. Repeat this afternoon was 2.05. Encouraged fluids and will repeat tomorrow.  Plan surgery on Monday as long as creat continues to improve.   LOS: 7 days    Alleen BorneBryan K Lyndel Sarate 03/27/2018

## 2018-03-27 NOTE — Plan of Care (Signed)
  Problem: Health Behavior/Discharge Planning: Goal: Ability to manage health-related needs will improve Outcome: Progressing   Problem: Clinical Measurements: Goal: Ability to maintain clinical measurements within normal limits will improve Outcome: Progressing Goal: Will remain free from infection Outcome: Progressing Goal: Cardiovascular complication will be avoided Outcome: Progressing   Problem: Activity: Goal: Risk for activity intolerance will decrease Outcome: Progressing   Problem: Nutrition: Goal: Adequate nutrition will be maintained Outcome: Progressing   Problem: Coping: Goal: Level of anxiety will decrease Outcome: Progressing   Problem: Elimination: Goal: Will not experience complications related to urinary retention Outcome: Progressing   Problem: Safety: Goal: Ability to remain free from injury will improve Outcome: Progressing   Problem: Skin Integrity: Goal: Risk for impaired skin integrity will decrease Outcome: Progressing

## 2018-03-28 ENCOUNTER — Encounter (HOSPITAL_COMMUNITY): Payer: Self-pay | Admitting: Certified Registered Nurse Anesthetist

## 2018-03-28 DIAGNOSIS — N898 Other specified noninflammatory disorders of vagina: Secondary | ICD-10-CM

## 2018-03-28 DIAGNOSIS — R7989 Other specified abnormal findings of blood chemistry: Secondary | ICD-10-CM

## 2018-03-28 LAB — BASIC METABOLIC PANEL
Anion gap: 6 (ref 5–15)
BUN: 24 mg/dL — ABNORMAL HIGH (ref 6–20)
CO2: 24 mmol/L (ref 22–32)
Calcium: 7.8 mg/dL — ABNORMAL LOW (ref 8.9–10.3)
Chloride: 107 mmol/L (ref 98–111)
Creatinine, Ser: 1.53 mg/dL — ABNORMAL HIGH (ref 0.44–1.00)
GFR calc Af Amer: 50 mL/min — ABNORMAL LOW (ref >60)
GFR calc non Af Amer: 43 mL/min — ABNORMAL LOW (ref >60)
Glucose, Bld: 98 mg/dL (ref 70–99)
Potassium: 5.3 mmol/L — ABNORMAL HIGH (ref 3.5–5.1)
Sodium: 137 mmol/L (ref 135–145)

## 2018-03-28 LAB — CBC
HCT: 27.8 % — ABNORMAL LOW (ref 36.0–46.0)
Hemoglobin: 8.6 g/dL — ABNORMAL LOW (ref 12.0–15.0)
MCH: 30.3 pg (ref 26.0–34.0)
MCHC: 30.9 g/dL (ref 30.0–36.0)
MCV: 97.9 fL (ref 78.0–100.0)
Platelets: 363 10*3/uL (ref 150–400)
RBC: 2.84 MIL/uL — ABNORMAL LOW (ref 3.87–5.11)
RDW: 15.4 % (ref 11.5–15.5)
WBC: 10.8 10*3/uL — ABNORMAL HIGH (ref 4.0–10.5)

## 2018-03-28 LAB — SURGICAL PCR SCREEN
MRSA, PCR: NEGATIVE
Staphylococcus aureus: POSITIVE — AB

## 2018-03-28 LAB — PREPARE RBC (CROSSMATCH)

## 2018-03-28 MED ORDER — TRANEXAMIC ACID 1000 MG/10ML IV SOLN
1.5000 mg/kg/h | INTRAVENOUS | Status: AC
  Administered 2018-03-29: 1.5 mg/kg/h via INTRAVENOUS
  Filled 2018-03-28: qty 25

## 2018-03-28 MED ORDER — TRANEXAMIC ACID (OHS) BOLUS VIA INFUSION
15.0000 mg/kg | INTRAVENOUS | Status: AC
  Administered 2018-03-29: 1006.5 mg via INTRAVENOUS
  Filled 2018-03-28: qty 1007

## 2018-03-28 MED ORDER — TRANEXAMIC ACID (OHS) PUMP PRIME SOLUTION
2.0000 mg/kg | INTRAVENOUS | Status: DC
  Filled 2018-03-28: qty 1.34

## 2018-03-28 MED ORDER — POTASSIUM CHLORIDE 2 MEQ/ML IV SOLN
80.0000 meq | INTRAVENOUS | Status: DC
  Filled 2018-03-28: qty 40

## 2018-03-28 MED ORDER — EPINEPHRINE PF 1 MG/ML IJ SOLN
0.0000 ug/min | INTRAVENOUS | Status: DC
  Filled 2018-03-28 (×2): qty 4

## 2018-03-28 MED ORDER — DOPAMINE-DEXTROSE 3.2-5 MG/ML-% IV SOLN
0.0000 ug/kg/min | INTRAVENOUS | Status: DC
  Filled 2018-03-28: qty 250

## 2018-03-28 MED ORDER — SODIUM CHLORIDE 0.9 % IV SOLN
30.0000 ug/min | INTRAVENOUS | Status: AC
  Administered 2018-03-29: 20 ug/min via INTRAVENOUS
  Filled 2018-03-28: qty 2

## 2018-03-28 MED ORDER — NITROGLYCERIN IN D5W 200-5 MCG/ML-% IV SOLN
2.0000 ug/min | INTRAVENOUS | Status: AC
  Administered 2018-03-29: 16.6 ug/min via INTRAVENOUS
  Filled 2018-03-28: qty 250

## 2018-03-28 MED ORDER — CHLORHEXIDINE GLUCONATE 0.12 % MT SOLN
15.0000 mL | Freq: Once | OROMUCOSAL | Status: AC
  Administered 2018-03-29: 15 mL via OROMUCOSAL
  Filled 2018-03-28: qty 15

## 2018-03-28 MED ORDER — SODIUM CHLORIDE 0.9 % IV SOLN
1.5000 g | INTRAVENOUS | Status: AC
  Administered 2018-03-29: 1.5 g via INTRAVENOUS
  Filled 2018-03-28: qty 1.5

## 2018-03-28 MED ORDER — PLASMA-LYTE 148 IV SOLN
INTRAVENOUS | Status: DC
  Filled 2018-03-28: qty 2.5

## 2018-03-28 MED ORDER — FLUCONAZOLE 150 MG PO TABS
150.0000 mg | ORAL_TABLET | Freq: Once | ORAL | Status: AC
  Administered 2018-03-28: 150 mg via ORAL
  Filled 2018-03-28: qty 1

## 2018-03-28 MED ORDER — SODIUM CHLORIDE 0.9 % IV SOLN
750.0000 mg | INTRAVENOUS | Status: AC
  Administered 2018-03-29: .75 g via INTRAVENOUS
  Filled 2018-03-28: qty 750

## 2018-03-28 MED ORDER — CHLORHEXIDINE GLUCONATE CLOTH 2 % EX PADS
6.0000 | MEDICATED_PAD | Freq: Once | CUTANEOUS | Status: AC
  Administered 2018-03-29: 6 via TOPICAL

## 2018-03-28 MED ORDER — TEMAZEPAM 15 MG PO CAPS: 15.0000 mg | ORAL_CAPSULE | Freq: Once | ORAL | Status: DC | PRN

## 2018-03-28 MED ORDER — DEXMEDETOMIDINE HCL IN NACL 400 MCG/100ML IV SOLN
0.1000 ug/kg/h | INTRAVENOUS | Status: AC
  Administered 2018-03-29: .3 ug/kg/h via INTRAVENOUS
  Filled 2018-03-28: qty 100

## 2018-03-28 MED ORDER — VANCOMYCIN HCL 10 G IV SOLR
1250.0000 mg | INTRAVENOUS | Status: DC
  Filled 2018-03-28: qty 1250

## 2018-03-28 MED ORDER — MAGNESIUM SULFATE 50 % IJ SOLN
40.0000 meq | INTRAMUSCULAR | Status: DC
  Filled 2018-03-28: qty 9.85

## 2018-03-28 MED ORDER — METOPROLOL TARTRATE 12.5 MG HALF TABLET
12.5000 mg | ORAL_TABLET | Freq: Once | ORAL | Status: AC
  Administered 2018-03-29: 12.5 mg via ORAL
  Filled 2018-03-28: qty 1

## 2018-03-28 MED ORDER — MILRINONE LACTATE IN DEXTROSE 20-5 MG/100ML-% IV SOLN
0.1250 ug/kg/min | INTRAVENOUS | Status: DC
  Filled 2018-03-28: qty 100

## 2018-03-28 MED ORDER — CHLORHEXIDINE GLUCONATE CLOTH 2 % EX PADS
6.0000 | MEDICATED_PAD | Freq: Once | CUTANEOUS | Status: AC
  Administered 2018-03-28: 6 via TOPICAL

## 2018-03-28 MED ORDER — SODIUM CHLORIDE 0.9 % IV SOLN
INTRAVENOUS | Status: DC
  Filled 2018-03-28: qty 30

## 2018-03-28 MED ORDER — DIAZEPAM 5 MG PO TABS
5.0000 mg | ORAL_TABLET | Freq: Once | ORAL | Status: AC
  Administered 2018-03-29: 5 mg via ORAL
  Filled 2018-03-28: qty 1

## 2018-03-28 MED ORDER — SODIUM CHLORIDE 0.9 % IV SOLN
INTRAVENOUS | Status: AC
  Administered 2018-03-29: 1 [IU]/h via INTRAVENOUS
  Filled 2018-03-28: qty 1

## 2018-03-28 MED ORDER — BISACODYL 5 MG PO TBEC
5.0000 mg | DELAYED_RELEASE_TABLET | Freq: Once | ORAL | Status: AC
  Administered 2018-03-28: 5 mg via ORAL
  Filled 2018-03-28: qty 1

## 2018-03-28 NOTE — Progress Notes (Signed)
Family Medicine Teaching Service Daily Progress Note Intern Pager: [phone]  Patient name: Tiffany Salazar Medical record number: [id number] Date of birth: 03/08/1981 Age: 37 y.o. Gender: female  Primary Care Provider: Cory Munched, [facility] Team, MD Consultants: Cardiology, CVTS, ID Code Status: Full  Pt Overview and Major Events to Date:  [place] [facility] [place]/MRSA bacteremia/PE/hemoptosis.  Reports neg reaction to vanc/zosyn and thoracentesis from PE. [facility], started on dapt/bactrim 7/29 [facility] assumed care 8/2 - PICC placed 8/5 - planned TV replacement  Subjective:  No acute events overnight. Today, patient is laying comfortably and sleeping upon arrival. Easily aroused for conversation. Patient states her shoulder/chest pain has resolved. She is sleeping well. Still has not had a BM. Is increasing activity level to walking down the hall 2x/day. She has improvement in anxiety with PICC line and decreased venipuncture. She complains of frequency of urination although she is on IV fluids 14500mL/hr. She states that she has external vaginal itching and is requesting a cream. She endorses a thick white discharge. Denies dysuria or suprapubic tenderness.  Assessment and Plan: Tiffany Salazar is a 37yo F with PMH of IVDU who is admitted for MRSA bacteremia and TV endocarditis.  TV endocarditis Tricuspid valve with RV vegetation visualized on TEE 7/30 with septic emboli to lungs visualized on CT chest. Secondary to IVDU and MRSA bacteremia. Previous adverse reaction to Vanc/Zosyn. WBC responding to current abx regimen (17.4>20.3>>12.2), afebrile. - ID, Cardiology consulted   -cont Daptomycin (7/27- 9/7?) 6wks per ID  -cont Bactrim (7/27- 9/7?) 6wks per ID  - CVTS following, planning for TVR 8/5 - tylenol prn for fever  Pulmonary septic emboli, stable respiration on RA Secondary to  endocarditis/bacteremia. Patient is s/p thoracenteses for parapneumonic effusions. Chest x-ray 7/27, 8/2 without evidence of pleural effusions. Lungs CTA this am. - Management above  AKI Received toradol for L shoulder pain, most likely etiology although Cr slightly elevated throughout admission.Cr levels improved today to 1.53 from 2.05 yesterday.  - continue maintenance fluids -encourage continued oral fluid intake - recheck pm BMP -d/c fluids if creatine returns to normal limits  Chest/L Shoulder Pain resolved Serial shoulder exams without evidence of joint instability or infection. No limitations with ROM or functional use. Chest pain reproducible with palpation, previous trop neg with EKG NSR.  - heating pad, muscle rub cream prn - tylenol, tramadol PRN - encourage OOB  MRSA bacteremia -Management above -Repeat blood cultures NG>48h  IV drug use  Polysubstance abuse   Currently with PICC line in place, will require hospitalization for duration of IV antibiotics. Remains on suboxone with desires to stay sober. Will continue to monitor for abusive potential and use pain control judiciously. - cont Suboxone 8-2 mg daily. Will need to establish with suboxone provider outpatient. - nicotine patch 14mg  - tylenol, tramadol prn for pain  Constipation  Patient reports long-standing h/o constipation, likely due to long term opioid addiction. States she usually goes about once every few weeks. Reports last BM was approximately 7 days ago prior to admission. Endorsed small BM s/p fleet enema Friday evening. Denies any further BM activity. Will continue to monitor. - bentyl TID - colace daily - miralax daily - senna BID  Anemia Hb 9.6>10.9>9.1>8.6 this am. Normocytic with unknown etiology.No known blood loss, asymptomatic. Iron panel wnl. - Repeat pm CBC  Sleep disorder On admission, patient with difficulties sleeping and stated it was  because she was "coming off all that stuff."  She is getting good rest and denies trazadone benefiting.  - continue trazadone 50mg  prn  Hep C reactivity Noted positive Hep C Ab in 2016. Would likely need to establish with hepatologist as an outpatient.  FEN/GI: regular, IV NS 172mL/hr PPx: SCDs  Disposition: continue inpatient management of TV endocarditis with IV antibiotics, planned TV replacement 8/5.  Objective: Temp:  [97.7 F (36.5 C)-99.2 F (37.3 C)] 99.2 F (37.3 C) (08/04 0400) Pulse Rate:  [55-108] 90 (08/03 2353) Resp:  [13-26] 26 (08/04 0400) BP: (80-108)/(56-69) 96/56 (08/04 0400) SpO2:  [93 %-100 %] 97 % (08/04 0400)   Physical Exam: General: Awake, alert, oriented. Cardiovascular: RRR, no murmur auscultated Respiratory: CTAB, no wheezes/rales/rhonchi Abdomen: soft, mildly tender diffusely to palpation, non distended, no masses, normoactive BS. No suprapubic tenderness to palpation Extremities: warm and well perfused  Laboratory: Recent Labs  Lab 03/27/18 0624 03/27/18 1637 03/28/18 0421  WBC 12.2* 12.0* 10.8*  HGB 9.1* 8.5* 8.6*  HCT 29.6* 27.3* 27.8*  PLT 382 344 363   Recent Labs  Lab 03/22/18 1346  03/27/18 0624 03/27/18 1637 03/28/18 0421  NA 140   < > 137 136 137  K 4.3   < > 5.0 5.1 5.3*  CL 103   < > 100 103 107  CO2 27   < > 28 26 24   BUN 23*   < > 38* 34* 24*  CREATININE 1.11*   < > 2.40* 2.05* 1.53*  CALCIUM 8.7*   < > 8.3* 8.0* 7.8*  PROT 7.5  --   --   --   --   BILITOT 0.6  --   --   --   --   ALKPHOS 80  --   --   --   --   ALT 73*  --   --   --   --   AST 52*  --   --   --   --   GLUCOSE 117*   < > 99 99 98   < > = values in this interval not displayed.   TEE 7/30 - LVEF 55-60%. No WMA. Large TV vegetations with severe, wide-open TR.  Imaging/Diagnostic Tests: No results found. Leeroy Bock, DO 03/28/2018, 6:56 AM PGY-1,  Family Medicine FPTS Intern pager: [phone], text pages welcome

## 2018-03-28 NOTE — Progress Notes (Signed)
5 Days Post-Op Procedure(s) (LRB): TRANSESOPHAGEAL ECHOCARDIOGRAM (TEE) (N/A) Subjective: No complaints  Objective: Vital signs in last 24 hours: Temp:  [98.6 F (37 C)-99.2 F (37.3 C)] 98.7 F (37.1 C) (08/04 1204) Pulse Rate:  [90-108] 93 (08/04 1204) Cardiac Rhythm: Sinus tachycardia (08/04 1100) Resp:  [14-26] 23 (08/04 1204) BP: (96-108)/(56-69) 100/62 (08/04 1204) SpO2:  [94 %-100 %] 97 % (08/04 1204)  Hemodynamic parameters for last 24 hours:    Intake/Output from previous day: 08/03 0701 - 08/04 0700 In: 2916.4 [P.O.:1440; I.V.:1476.4] Out: -  Intake/Output this shift: Total I/O In: 2888.1 [P.O.:480; I.V.:487.4; IV Piggyback:1920.7] Out: -   General appearance: alert and cooperative Heart: regular rate and rhythm, S1, S2 normal, no murmur, click, rub or gallop Lungs: clear to auscultation bilaterally  Lab Results: Recent Labs    03/27/18 1637 03/28/18 0421  WBC 12.0* 10.8*  HGB 8.5* 8.6*  HCT 27.3* 27.8*  PLT 344 363   BMET:  Recent Labs    03/27/18 1637 03/28/18 0421  NA 136 137  K 5.1 5.3*  CL 103 107  CO2 26 24  GLUCOSE 99 98  BUN 34* 24*  CREATININE 2.05* 1.53*  CALCIUM 8.0* 7.8*    PT/INR: No results for input(s): LABPROT, INR in the last 72 hours. ABG No results found for: PHART, HCO3, TCO2, ACIDBASEDEF, O2SAT CBG (last 3)  No results for input(s): GLUCAP in the last 72 hours.  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  Creat improved to 1.5 today. Should be ok for surgery tomorrow.  I discussed the operative procedure with the patient and family including alternatives, benefits and risks; including but not limited to bleeding, blood transfusion, infection, stroke, myocardial infarction, graft failure, heart block requiring a permanent pacemaker, organ dysfunction, and death.  [name] understands and agrees to proceed.  We will schedule tricuspid valve replacement for tomorrow.  LOS:  8 days    Alleen BorneBryan K Bartle 03/28/2018

## 2018-03-28 NOTE — Anesthesia Preprocedure Evaluation (Addendum)
Anesthesia Evaluation  Patient identified by MRN, date of birth, ID band Patient awake    Reviewed: Allergy & Precautions, H&P , NPO status , Patient's Chart, lab work & pertinent test results  Airway Mallampati: II  TM Distance: >3 FB Neck ROM: Full    Dental no notable dental hx. (+) Teeth Intact, Dental Advisory Given   Pulmonary neg pulmonary ROS, Current Smoker,    Pulmonary exam normal breath sounds clear to auscultation       Cardiovascular Exercise Tolerance: Good + Valvular Problems/Murmurs  Rhythm:Regular Rate:Normal     Neuro/Psych negative neurological ROS  negative psych ROS   GI/Hepatic negative GI ROS, (+)     substance abuse  cocaine use and IV drug use, Hepatitis -, C  Endo/Other  negative endocrine ROS  Renal/GU negative Renal ROS  negative genitourinary   Musculoskeletal   Abdominal   Peds  Hematology negative hematology ROS (+)   Anesthesia Other Findings   Reproductive/Obstetrics negative OB ROS                            Anesthesia Physical Anesthesia Plan  ASA: IV  Anesthesia Plan: General   Post-op Pain Management:    Induction: Intravenous  PONV Risk Score and Plan: 3 and Midazolam and Treatment may vary due to age or medical condition  Airway Management Planned: Oral ETT  Additional Equipment: Arterial line, CVP, PA Cath, TEE and Ultrasound Guidance Line Placement  Intra-op Plan:   Post-operative Plan: Post-operative intubation/ventilation  Informed Consent: I have reviewed the patients History and Physical, chart, labs and discussed the procedure including the risks, benefits and alternatives for the proposed anesthesia with the patient or authorized representative who has indicated his/her understanding and acceptance.   Dental advisory given  Plan Discussed with: CRNA  Anesthesia Plan Comments:         Anesthesia Quick Evaluation

## 2018-03-28 NOTE — Progress Notes (Signed)
Progress Note  Patient Name: Tiffany Salazar Date of Encounter: 03/28/2018  Primary Cardiologist: Chilton Si, MD   Subjective   Anxiety about surgery   Inpatient Medications    Scheduled Meds: . buprenorphine-naloxone  1 tablet Sublingual Daily  . busPIRone  15 mg Oral BID  . dicyclomine  10 mg Oral TID AC  . docusate sodium  100 mg Oral Daily  . multivitamin with minerals  1 tablet Oral Daily  . nicotine  14 mg Transdermal Daily  . pantoprazole  40 mg Oral Daily  . polyethylene glycol  17 g Oral Daily  . senna  1 tablet Oral BID  . sodium chloride flush  10-40 mL Intracatheter Q12H  . sulfamethoxazole-trimethoprim  1 tablet Oral Q12H   Continuous Infusions: . sodium chloride 100 mL/hr at 03/28/18 1101  . DAPTOmycin (CUBICIN)  IV Stopped (03/27/18 2204)   PRN Meds: acetaminophen **OR** acetaminophen, diphenhydrAMINE, gi cocktail, MUSCLE RUB, ondansetron **OR** ondansetron (ZOFRAN) IV, sodium chloride flush, traMADol, traZODone   Vital Signs    Vitals:   03/27/18 1936 03/27/18 2353 03/28/18 0400 03/28/18 0725  BP: 101/68 99/65 (!) 96/56 100/61  Pulse: (!) 102 90  95  Resp: 18 18 (!) 26 15  Temp: 99 F (37.2 C) 98.6 F (37 C) 99.2 F (37.3 C) 99.1 F (37.3 C)  TempSrc: Oral  Oral Oral  SpO2: 97% 100% 97% 94%  Weight:      Height:        Intake/Output Summary (Last 24 hours) at 03/28/2018 1122 Last data filed at 03/28/2018 1101 Gross per 24 hour  Intake 5314.47 ml  Output -  Net 5314.47 ml   Filed Weights   03/20/18 2119 03/23/18 1336  Weight: 150 lb (68 kg) 148 lb (67.1 kg)    Telemetry    Sinus rhythm, sinus tachycardia.  No events - Personally Reviewed  ECG    03/23/18: Sinus rhythm rate 74 bpm.  - Personally Reviewed  Physical Exam   VS:  BP 100/61 (BP Location: Left Arm)   Pulse 95   Temp 99.1 F (37.3 C) (Oral)   Resp 15   Ht 5\' 7"  (1.702 m)   Wt 148 lb (67.1 kg)   LMP 03/15/2018   SpO2 94%   BMI 23.18 kg/m  , BMI Body mass  index is 23.18 kg/m. Affect appropriate Thin female  HEENT: normal Neck supple with no adenopathy JVP normal no bruits no thyromegaly Lungs clear with no wheezing and good diaphragmatic motion Heart:  S1/S2 TR  murmur, no rub, gallop or click PMI normal Abdomen: benighn, BS positve, no tenderness, no AAA no bruit.  No HSM or HJR Distal pulses intact with no bruits No edema Neuro non-focal Skin warm and dry No muscular weakness    Labs    Chemistry Recent Labs  Lab 03/22/18 1346  03/27/18 0624 03/27/18 1637 03/28/18 0421  NA 140   < > 137 136 137  K 4.3   < > 5.0 5.1 5.3*  CL 103   < > 100 103 107  CO2 27   < > 28 26 24   GLUCOSE 117*   < > 99 99 98  BUN 23*   < > 38* 34* 24*  CREATININE 1.11*   < > 2.40* 2.05* 1.53*  CALCIUM 8.7*   < > 8.3* 8.0* 7.8*  PROT 7.5  --   --   --   --   ALBUMIN 2.6*  --   --   --   --  AST 52*  --   --   --   --   ALT 73*  --   --   --   --   ALKPHOS 80  --   --   --   --   BILITOT 0.6  --   --   --   --   GFRNONAA >60   < > 25* 30* 43*  GFRAA >60   < > 29* 35* 50*  ANIONGAP 10   < > 9 7 6    < > = values in this interval not displayed.     Hematology Recent Labs  Lab 03/27/18 0624 03/27/18 1637 03/28/18 0421  WBC 12.2* 12.0* 10.8*  RBC 3.08* 2.82* 2.84*  HGB 9.1* 8.5* 8.6*  HCT 29.6* 27.3* 27.8*  MCV 96.1 96.8 97.9  MCH 29.5 30.1 30.3  MCHC 30.7 31.1 30.9  RDW 15.3 15.3 15.4  PLT 382 344 363    Cardiac Enzymes Recent Labs  Lab 03/25/18 1919  TROPONINI <0.03   No results for input(s): TROPIPOC in the last 168 hours.    Radiology    Dg Chest Port 1 View  Result Date: 03/26/2018 CLINICAL DATA:  Chest pain EXAM: PORTABLE CHEST 1 VIEW COMPARISON:  03/24/2018 CT of the chest FINDINGS: Cardiac shadows within normal limits. Patchy densities are again identified throughout both lungs with some degree of cavitation similar to that seen on the prior exam. No new focal infiltrate or effusion is seen. No bony abnormality is  noted. IMPRESSION: Stable cavitary lesions bilaterally. Electronically Signed   By: Alcide CleverMark  Lukens M.D.   On: 03/26/2018 11:34    Cardiac Studies   TEE 03/23/18:  Study Conclusions  - Left ventricle: The cavity size was normal. Wall thickness was   normal. Systolic function was normal. The estimated ejection   fraction was in the range of 55% to 60%. Wall motion was normal;   there were no regional wall motion abnormalities. - Aortic valve: No evidence of vegetation. - Mitral valve: No evidence of vegetation. - Left atrium: No evidence of thrombus in the atrial cavity or   appendage. - Right atrium: No evidence of thrombus in the atrial cavity or   appendage. - Atrial septum: No defect or patent foramen ovale was identified. - Tricuspid valve: Large 0.5 x 1 cm vegetations of the septal and   posterior leafelts (seen in 2D and 3D views) with associated   severe, wide-open TR. Peak RV-RA gradient (S): 88 mm Hg.   Regurgitant VTI: 88 cm. - Pulmonic valve: No evidence of vegetation.  Impressions:  - 2 large mobile vegetations of the TV with severe TR and high   embolic potential. Consider CT surgical consult for debridement   and TV replacement.   ECHO: 03/10/2018: Summary:  -Clinical question: Sepsis  -Left ventricle: Size was normal. Systolic function was normal. Ejection  fraction was estimated in the range of 55 % to 60 %. There were no regional  wall motion abnormalities. Left ventricular diastolic function parameters were normal.  -Mitral valve: There was no evidence for vegetation.  -Right ventricle: The size was normal. Systolic function was normal.  Systolic pressure was within the normal range. Estimated peak pressure was at  least 24 mmHg. There was a mobile mass in the ventricular cavity. It may  represent a vegetation.  -Tricuspid valve: There was trace regurgitation. There was echogenic mobile  mass on tricuspid valve leaflets and also in the  right ventricular chamber  consistent  with vegetations .  Pulmonary veins: Not well visualized.  Right ventricle: The size was normal. Systolic function was normal. Wall  thickness was normal. There was a mobile mass in the ventricular cavity. It  may represent a vegetation. Doppler: Systolic pressure was within the normal  range. Estimated peak pressure was at least 24 mmHg.  Pulmonic valve: Not well visualized.  Tricuspid valve: The valve structure was normal. There was normal leaflet  separation. There was echogenic mobile mass on tricuspid valve leaflets and  also in the right ventricular chamber consistent with vegetations . Doppler:  There was no evidence for tricuspid stenosis. There was trace regurgitation.  Right atrium: Size was normal.  Systemic veins: IVC: The inferior vena cava was normal in size and course.  Respirophasic changes were normal.   Patient Profile     37 y.o. female with a history of IV drug abuse here with MRSA tricuspid valve endocarditis and septic pulmonary emboli.  Assessment & Plan    # Tricuspid valve endocarditis: # Septic pulmonary emboli: Outside hospital blood cultures were positive for MRSA.  Cultures here NGTD.  Large TV vegetations noted on TEE with severe TR.  Given this and her septic pulmonary emboli she is unlikely to clear the infection with antibiotics alone.  Plan for TV repair/replacement on Monday .  Antibiotics per ID.  # IVDA: # Polysubstance abuse:  Patient eager to quit.  Started suboxone this hospitalization and she has a nicotine patch.  # ?AKI:  Cr 1.5 improved with hydration limit Toradol use      For questions or updates, please contact [facility] Please consult [email or web address] for contact info under Cardiology/STEMI.      Signed, Charlton Haws, MD  03/28/2018, 11:22 AM

## 2018-03-29 ENCOUNTER — Encounter (HOSPITAL_COMMUNITY): Payer: Self-pay | Admitting: Anesthesiology

## 2018-03-29 ENCOUNTER — Inpatient Hospital Stay (HOSPITAL_COMMUNITY): Payer: Medicaid Other | Admitting: Certified Registered Nurse Anesthetist

## 2018-03-29 ENCOUNTER — Inpatient Hospital Stay (HOSPITAL_COMMUNITY): Payer: Medicaid Other

## 2018-03-29 ENCOUNTER — Inpatient Hospital Stay (HOSPITAL_COMMUNITY)
Admission: EM | Disposition: A | Discharge status: Home / Self Care | Payer: Self-pay | Source: Home / Self Care | Attending: Surgery

## 2018-03-29 HISTORY — PX: TEE WITHOUT CARDIOVERSION: SHX5443

## 2018-03-29 HISTORY — PX: TRICUSPID VALVE REPLACEMENT: SHX816

## 2018-03-29 LAB — POCT I-STAT 3, ART BLOOD GAS (G3+)
Acid-Base Excess: 1 mmol/L (ref 0.0–2.0)
Acid-base deficit: 3 mmol/L — ABNORMAL HIGH (ref 0.0–2.0)
Acid-base deficit: 3 mmol/L — ABNORMAL HIGH (ref 0.0–2.0)
Acid-base deficit: 2 mmol/L (ref 0.0–2.0)
Acid-base deficit: 1 mmol/L (ref 0.0–2.0)
Bicarbonate: 25.7 mmol/L (ref 20.0–28.0)
Bicarbonate: 22.9 mmol/L (ref 20.0–28.0)
Bicarbonate: 22.9 mmol/L (ref 20.0–28.0)
Bicarbonate: 23.2 mmol/L (ref 20.0–28.0)
Bicarbonate: 24.2 mmol/L (ref 20.0–28.0)
O2 Saturation: 100 %
O2 Saturation: 99 %
O2 Saturation: 98 %
O2 Saturation: 99 %
O2 Saturation: 100 %
Patient temperature: 36.6
Patient temperature: 37
Patient temperature: 37.1
TCO2: 27 mmol/L (ref 22–32)
TCO2: 24 mmol/L (ref 22–32)
TCO2: 24 mmol/L (ref 22–32)
TCO2: 24 mmol/L (ref 22–32)
TCO2: 25 mmol/L (ref 22–32)
pCO2 arterial: 40 mmHg (ref 32.0–48.0)
pCO2 arterial: 41.8 mmHg (ref 32.0–48.0)
pCO2 arterial: 41 mmHg (ref 32.0–48.0)
pCO2 arterial: 40.7 mmHg (ref 32.0–48.0)
pCO2 arterial: 42.7 mmHg (ref 32.0–48.0)
pH, Arterial: 7.417 (ref 7.350–7.450)
pH, Arterial: 7.346 — ABNORMAL LOW (ref 7.350–7.450)
pH, Arterial: 7.354 (ref 7.350–7.450)
pH, Arterial: 7.363 (ref 7.350–7.450)
pH, Arterial: 7.361 (ref 7.350–7.450)
pO2, Arterial: 304 mmHg — ABNORMAL HIGH (ref 83.0–108.0)
pO2, Arterial: 173 mmHg — ABNORMAL HIGH (ref 83.0–108.0)
pO2, Arterial: 111 mmHg — ABNORMAL HIGH (ref 83.0–108.0)
pO2, Arterial: 158 mmHg — ABNORMAL HIGH (ref 83.0–108.0)
pO2, Arterial: 196 mmHg — ABNORMAL HIGH (ref 83.0–108.0)

## 2018-03-29 LAB — BASIC METABOLIC PANEL
Anion gap: 7 (ref 5–15)
BUN: 20 mg/dL (ref 6–20)
CO2: 25 mmol/L (ref 22–32)
Calcium: 8.4 mg/dL — ABNORMAL LOW (ref 8.9–10.3)
Chloride: 106 mmol/L (ref 98–111)
Creatinine, Ser: 1.46 mg/dL — ABNORMAL HIGH (ref 0.44–1.00)
GFR calc Af Amer: 53 mL/min — ABNORMAL LOW (ref >60)
GFR calc non Af Amer: 46 mL/min — ABNORMAL LOW (ref >60)
Glucose, Bld: 94 mg/dL (ref 70–99)
Potassium: 5 mmol/L (ref 3.5–5.1)
Sodium: 138 mmol/L (ref 135–145)

## 2018-03-29 LAB — POCT I-STAT, CHEM 8
BUN: 20 mg/dL (ref 6–20)
BUN: 17 mg/dL (ref 6–20)
BUN: 19 mg/dL (ref 6–20)
BUN: 17 mg/dL (ref 6–20)
BUN: 16 mg/dL (ref 6–20)
BUN: 16 mg/dL (ref 6–20)
Calcium, Ion: 1.23 mmol/L (ref 1.15–1.40)
Calcium, Ion: 1.12 mmol/L — ABNORMAL LOW (ref 1.15–1.40)
Calcium, Ion: 1.2 mmol/L (ref 1.15–1.40)
Calcium, Ion: 1.14 mmol/L — ABNORMAL LOW (ref 1.15–1.40)
Calcium, Ion: 1.12 mmol/L — ABNORMAL LOW (ref 1.15–1.40)
Calcium, Ion: 1.17 mmol/L (ref 1.15–1.40)
Chloride: 104 mmol/L (ref 98–111)
Chloride: 102 mmol/L (ref 98–111)
Chloride: 103 mmol/L (ref 98–111)
Chloride: 104 mmol/L (ref 98–111)
Chloride: 105 mmol/L (ref 98–111)
Chloride: 103 mmol/L (ref 98–111)
Creatinine, Ser: 1.3 mg/dL — ABNORMAL HIGH (ref 0.44–1.00)
Creatinine, Ser: 1.2 mg/dL — ABNORMAL HIGH (ref 0.44–1.00)
Creatinine, Ser: 1.3 mg/dL — ABNORMAL HIGH (ref 0.44–1.00)
Creatinine, Ser: 1 mg/dL (ref 0.44–1.00)
Creatinine, Ser: 1.1 mg/dL — ABNORMAL HIGH (ref 0.44–1.00)
Creatinine, Ser: 1.1 mg/dL — ABNORMAL HIGH (ref 0.44–1.00)
Glucose, Bld: 96 mg/dL (ref 70–99)
Glucose, Bld: 106 mg/dL — ABNORMAL HIGH (ref 70–99)
Glucose, Bld: 110 mg/dL — ABNORMAL HIGH (ref 70–99)
Glucose, Bld: 161 mg/dL — ABNORMAL HIGH (ref 70–99)
Glucose, Bld: 138 mg/dL — ABNORMAL HIGH (ref 70–99)
Glucose, Bld: 110 mg/dL — ABNORMAL HIGH (ref 70–99)
HCT: 25 % — ABNORMAL LOW (ref 36.0–46.0)
HCT: 23 % — ABNORMAL LOW (ref 36.0–46.0)
HCT: 25 % — ABNORMAL LOW (ref 36.0–46.0)
HCT: 25 % — ABNORMAL LOW (ref 36.0–46.0)
HCT: 26 % — ABNORMAL LOW (ref 36.0–46.0)
HCT: 24 % — ABNORMAL LOW (ref 36.0–46.0)
Hemoglobin: 8.5 g/dL — ABNORMAL LOW (ref 12.0–15.0)
Hemoglobin: 7.8 g/dL — ABNORMAL LOW (ref 12.0–15.0)
Hemoglobin: 8.5 g/dL — ABNORMAL LOW (ref 12.0–15.0)
Hemoglobin: 8.5 g/dL — ABNORMAL LOW (ref 12.0–15.0)
Hemoglobin: 8.8 g/dL — ABNORMAL LOW (ref 12.0–15.0)
Hemoglobin: 8.2 g/dL — ABNORMAL LOW (ref 12.0–15.0)
Potassium: 5 mmol/L (ref 3.5–5.1)
Potassium: 4.6 mmol/L (ref 3.5–5.1)
Potassium: 4.9 mmol/L (ref 3.5–5.1)
Potassium: 4.7 mmol/L (ref 3.5–5.1)
Potassium: 5.2 mmol/L — ABNORMAL HIGH (ref 3.5–5.1)
Potassium: 6.4 mmol/L (ref 3.5–5.1)
Sodium: 136 mmol/L (ref 135–145)
Sodium: 136 mmol/L (ref 135–145)
Sodium: 137 mmol/L (ref 135–145)
Sodium: 137 mmol/L (ref 135–145)
Sodium: 137 mmol/L (ref 135–145)
Sodium: 134 mmol/L — ABNORMAL LOW (ref 135–145)
TCO2: 24 mmol/L (ref 22–32)
TCO2: 24 mmol/L (ref 22–32)
TCO2: 27 mmol/L (ref 22–32)
TCO2: 24 mmol/L (ref 22–32)
TCO2: 22 mmol/L (ref 22–32)
TCO2: 24 mmol/L (ref 22–32)

## 2018-03-29 LAB — PROTIME-INR
INR: 1.51
Prothrombin Time: 18.1 seconds — ABNORMAL HIGH (ref 11.4–15.2)

## 2018-03-29 LAB — CBC
HCT: 29.3 % — ABNORMAL LOW (ref 36.0–46.0)
HCT: 27.4 % — ABNORMAL LOW (ref 36.0–46.0)
HCT: 25.9 % — ABNORMAL LOW (ref 36.0–46.0)
Hemoglobin: 9.1 g/dL — ABNORMAL LOW (ref 12.0–15.0)
Hemoglobin: 8.7 g/dL — ABNORMAL LOW (ref 12.0–15.0)
Hemoglobin: 8 g/dL — ABNORMAL LOW (ref 12.0–15.0)
MCH: 30.1 pg (ref 26.0–34.0)
MCH: 30.3 pg (ref 26.0–34.0)
MCH: 29.5 pg (ref 26.0–34.0)
MCHC: 31.1 g/dL (ref 30.0–36.0)
MCHC: 31.8 g/dL (ref 30.0–36.0)
MCHC: 30.9 g/dL (ref 30.0–36.0)
MCV: 97 fL (ref 78.0–100.0)
MCV: 95.5 fL (ref 78.0–100.0)
MCV: 95.6 fL (ref 78.0–100.0)
Platelets: 342 10*3/uL (ref 150–400)
Platelets: 216 10*3/uL (ref 150–400)
Platelets: 242 K/uL (ref 150–400)
RBC: 3.02 MIL/uL — ABNORMAL LOW (ref 3.87–5.11)
RBC: 2.87 MIL/uL — ABNORMAL LOW (ref 3.87–5.11)
RBC: 2.71 MIL/uL — ABNORMAL LOW (ref 3.87–5.11)
RDW: 15.2 % (ref 11.5–15.5)
RDW: 15.1 % (ref 11.5–15.5)
RDW: 15.2 % (ref 11.5–15.5)
WBC: 12 10*3/uL — ABNORMAL HIGH (ref 4.0–10.5)
WBC: 27.1 10*3/uL — ABNORMAL HIGH (ref 4.0–10.5)
WBC: 22.2 K/uL — ABNORMAL HIGH (ref 4.0–10.5)

## 2018-03-29 LAB — MAGNESIUM: Magnesium: 3.3 mg/dL — ABNORMAL HIGH (ref 1.7–2.4)

## 2018-03-29 LAB — GLUCOSE, CAPILLARY
Glucose-Capillary: 100 mg/dL — ABNORMAL HIGH (ref 70–99)
Glucose-Capillary: 105 mg/dL — ABNORMAL HIGH (ref 70–99)
Glucose-Capillary: 112 mg/dL — ABNORMAL HIGH (ref 70–99)
Glucose-Capillary: 113 mg/dL — ABNORMAL HIGH (ref 70–99)
Glucose-Capillary: 114 mg/dL — ABNORMAL HIGH (ref 70–99)
Glucose-Capillary: 126 mg/dL — ABNORMAL HIGH (ref 70–99)

## 2018-03-29 LAB — POCT I-STAT 4, (NA,K, GLUC, HGB,HCT)
Glucose, Bld: 112 mg/dL — ABNORMAL HIGH (ref 70–99)
HCT: 27 % — ABNORMAL LOW (ref 36.0–46.0)
Hemoglobin: 9.2 g/dL — ABNORMAL LOW (ref 12.0–15.0)
Potassium: 5.1 mmol/L (ref 3.5–5.1)
Sodium: 138 mmol/L (ref 135–145)

## 2018-03-29 LAB — CERVICOVAGINAL ANCILLARY ONLY
Bacterial vaginitis: NEGATIVE
Candida vaginitis: NEGATIVE
Trichomonas: POSITIVE — AB

## 2018-03-29 LAB — CREATININE, SERUM
Creatinine, Ser: 1.14 mg/dL — ABNORMAL HIGH (ref 0.44–1.00)
GFR calc Af Amer: >60 mL/min
GFR calc non Af Amer: >60 mL/min

## 2018-03-29 LAB — HEMOGLOBIN AND HEMATOCRIT, BLOOD
HCT: 24.1 % — ABNORMAL LOW (ref 36.0–46.0)
Hemoglobin: 7.3 g/dL — ABNORMAL LOW (ref 12.0–15.0)

## 2018-03-29 LAB — PLATELET COUNT: Platelets: 227 10*3/uL (ref 150–400)

## 2018-03-29 LAB — POTASSIUM: Potassium: 6.4 mmol/L (ref 3.5–5.1)

## 2018-03-29 LAB — PREPARE RBC (CROSSMATCH)

## 2018-03-29 LAB — APTT: aPTT: 35 seconds (ref 24–36)

## 2018-03-29 LAB — ABO/RH: ABO/RH(D): A POS

## 2018-03-29 SURGERY — TRICUSPID VALVE REPAIR
Anesthesia: General | Site: Chest

## 2018-03-29 MED ORDER — FENTANYL CITRATE (PF) 250 MCG/5ML IJ SOLN
INTRAMUSCULAR | Status: AC
  Filled 2018-03-29: qty 5

## 2018-03-29 MED ORDER — DOCUSATE SODIUM 100 MG PO CAPS
200.0000 mg | ORAL_CAPSULE | Freq: Every day | ORAL | Status: DC
  Administered 2018-03-30 – 2018-03-31 (×2): 200 mg via ORAL
  Filled 2018-03-29 (×2): qty 2

## 2018-03-29 MED ORDER — ACETAMINOPHEN 500 MG PO TABS
1000.0000 mg | ORAL_TABLET | Freq: Four times a day (QID) | ORAL | Status: DC
  Administered 2018-03-29 – 2018-03-31 (×6): 1000 mg via ORAL
  Filled 2018-03-29 (×6): qty 2

## 2018-03-29 MED ORDER — MIDAZOLAM HCL 2 MG/2ML IJ SOLN
2.0000 mg | INTRAMUSCULAR | Status: DC | PRN
  Administered 2018-03-29: 2 mg via INTRAVENOUS
  Filled 2018-03-29: qty 2

## 2018-03-29 MED ORDER — ROCURONIUM BROMIDE 10 MG/ML (PF) SYRINGE
PREFILLED_SYRINGE | INTRAVENOUS | Status: DC | PRN
  Administered 2018-03-29: 20 mg via INTRAVENOUS
  Administered 2018-03-29 (×2): 50 mg via INTRAVENOUS
  Administered 2018-03-29: 100 mg via INTRAVENOUS
  Administered 2018-03-29: 30 mg via INTRAVENOUS

## 2018-03-29 MED ORDER — ORAL CARE MOUTH RINSE
15.0000 mL | OROMUCOSAL | Status: DC
  Administered 2018-03-29: 15 mL via OROMUCOSAL

## 2018-03-29 MED ORDER — BISACODYL 10 MG RE SUPP: 10.0000 mg | Freq: Every day | RECTAL | Status: DC

## 2018-03-29 MED ORDER — FENTANYL CITRATE (PF) 250 MCG/5ML IJ SOLN
INTRAMUSCULAR | Status: AC
Start: 2018-03-29 — End: ?
  Filled 2018-03-29: qty 5

## 2018-03-29 MED ORDER — SODIUM CHLORIDE 0.9 % IV SOLN
1.5000 g | Freq: Two times a day (BID) | INTRAVENOUS | Status: DC
  Administered 2018-03-29 – 2018-03-30 (×3): 1.5 g via INTRAVENOUS
  Filled 2018-03-29 (×4): qty 1.5

## 2018-03-29 MED ORDER — MIDAZOLAM HCL 5 MG/5ML IJ SOLN
INTRAMUSCULAR | Status: DC | PRN
  Administered 2018-03-29 (×2): 3 mg via INTRAVENOUS
  Administered 2018-03-29 (×3): 2 mg via INTRAVENOUS

## 2018-03-29 MED ORDER — METOPROLOL TARTRATE 5 MG/5ML IV SOLN: 2.5000 mg | INTRAVENOUS | Status: DC | PRN

## 2018-03-29 MED ORDER — INSULIN ASPART 100 UNIT/ML ~~LOC~~ SOLN
0.0000 [IU] | SUBCUTANEOUS | Status: DC
  Administered 2018-03-29 – 2018-03-30 (×2): 2 [IU] via SUBCUTANEOUS

## 2018-03-29 MED ORDER — 0.9 % SODIUM CHLORIDE (POUR BTL) OPTIME
TOPICAL | Status: DC | PRN
  Administered 2018-03-29: 4000 mL

## 2018-03-29 MED ORDER — THROMBIN 5000 UNITS EX SOLR
CUTANEOUS | Status: DC | PRN
  Administered 2018-03-29 (×2): 5000 [IU] via TOPICAL

## 2018-03-29 MED ORDER — MORPHINE SULFATE (PF) 2 MG/ML IV SOLN
2.0000 mg | INTRAVENOUS | Status: DC | PRN
  Administered 2018-03-30 (×10): 4 mg via INTRAVENOUS
  Filled 2018-03-29: qty 2
  Filled 2018-03-29 (×3): qty 1
  Filled 2018-03-29 (×8): qty 2

## 2018-03-29 MED ORDER — ARTIFICIAL TEARS OPHTHALMIC OINT
TOPICAL_OINTMENT | OPHTHALMIC | Status: DC | PRN
  Administered 2018-03-29: 1 via OPHTHALMIC

## 2018-03-29 MED ORDER — DEXMEDETOMIDINE HCL IN NACL 200 MCG/50ML IV SOLN
0.0000 ug/kg/h | INTRAVENOUS | Status: DC
  Administered 2018-03-29: 0.7 ug/kg/h via INTRAVENOUS
  Administered 2018-03-29: 0.3 ug/kg/h via INTRAVENOUS
  Filled 2018-03-29 (×3): qty 50

## 2018-03-29 MED ORDER — ASPIRIN 81 MG PO CHEW: 324.0000 mg | CHEWABLE_TABLET | Freq: Every day | ORAL | Status: DC

## 2018-03-29 MED ORDER — ORAL CARE MOUTH RINSE
15.0000 mL | Freq: Two times a day (BID) | OROMUCOSAL | Status: DC
  Administered 2018-03-29 – 2018-03-30 (×2): 15 mL via OROMUCOSAL

## 2018-03-29 MED ORDER — BISACODYL 5 MG PO TBEC
10.0000 mg | DELAYED_RELEASE_TABLET | Freq: Every day | ORAL | Status: DC
  Administered 2018-03-30 – 2018-03-31 (×2): 10 mg via ORAL
  Filled 2018-03-29 (×2): qty 2

## 2018-03-29 MED ORDER — SUCCINYLCHOLINE CHLORIDE 200 MG/10ML IV SOSY
PREFILLED_SYRINGE | INTRAVENOUS | Status: AC
  Filled 2018-03-29: qty 10

## 2018-03-29 MED ORDER — SODIUM BICARBONATE 8.4 % IV SOLN
50.0000 meq | Freq: Once | INTRAVENOUS | Status: AC
  Administered 2018-03-29: 50 meq via INTRAVENOUS

## 2018-03-29 MED ORDER — FENTANYL CITRATE (PF) 250 MCG/5ML IJ SOLN
INTRAMUSCULAR | Status: AC
  Filled 2018-03-29: qty 20

## 2018-03-29 MED ORDER — MORPHINE SULFATE (PF) 2 MG/ML IV SOLN
1.0000 mg | INTRAVENOUS | Status: AC | PRN
  Administered 2018-03-29: 4 mg via INTRAVENOUS
  Administered 2018-03-29: 2 mg via INTRAVENOUS
  Filled 2018-03-29: qty 2

## 2018-03-29 MED ORDER — PROPOFOL 10 MG/ML IV BOLUS
INTRAVENOUS | Status: AC
  Filled 2018-03-29: qty 20

## 2018-03-29 MED ORDER — METOPROLOL TARTRATE 12.5 MG HALF TABLET
12.5000 mg | ORAL_TABLET | Freq: Two times a day (BID) | ORAL | Status: DC
  Administered 2018-03-30 – 2018-03-31 (×3): 12.5 mg via ORAL
  Filled 2018-03-29 (×3): qty 1

## 2018-03-29 MED ORDER — NITROGLYCERIN IN D5W 200-5 MCG/ML-% IV SOLN: 0.0000 ug/min | INTRAVENOUS | Status: DC

## 2018-03-29 MED ORDER — SODIUM CHLORIDE 0.9 % IV SOLN: 250.0000 mL | INTRAVENOUS | Status: DC

## 2018-03-29 MED ORDER — FAMOTIDINE IN NACL 20-0.9 MG/50ML-% IV SOLN
20.0000 mg | Freq: Two times a day (BID) | INTRAVENOUS | Status: DC
  Administered 2018-03-29: 20 mg via INTRAVENOUS

## 2018-03-29 MED ORDER — MAGNESIUM SULFATE 4 GM/100ML IV SOLN
4.0000 g | Freq: Once | INTRAVENOUS | Status: AC
  Administered 2018-03-29: 4 g via INTRAVENOUS
  Filled 2018-03-29: qty 100

## 2018-03-29 MED ORDER — LACTATED RINGERS IV SOLN
INTRAVENOUS | Status: DC | PRN
  Administered 2018-03-29: 07:00:00 via INTRAVENOUS

## 2018-03-29 MED ORDER — ASPIRIN EC 325 MG PO TBEC
325.0000 mg | DELAYED_RELEASE_TABLET | Freq: Every day | ORAL | Status: DC
  Administered 2018-03-30 – 2018-03-31 (×2): 325 mg via ORAL
  Filled 2018-03-29 (×2): qty 1

## 2018-03-29 MED ORDER — SODIUM CHLORIDE 0.9% FLUSH
10.0000 mL | INTRAVENOUS | Status: DC | PRN
  Administered 2018-04-07 – 2018-05-03 (×6): 10 mL
  Filled 2018-03-29 (×6): qty 40

## 2018-03-29 MED ORDER — TRAZODONE HCL 50 MG PO TABS
50.0000 mg | ORAL_TABLET | Freq: Every evening | ORAL | Status: DC | PRN
  Administered 2018-03-30 – 2018-05-09 (×40): 50 mg via ORAL
  Filled 2018-03-29 (×41): qty 1

## 2018-03-29 MED ORDER — PROTAMINE SULFATE 10 MG/ML IV SOLN
INTRAVENOUS | Status: DC | PRN
  Administered 2018-03-29: 25 mg via INTRAVENOUS

## 2018-03-29 MED ORDER — ACETAMINOPHEN 160 MG/5ML PO SOLN: 1000.0000 mg | Freq: Four times a day (QID) | ORAL | Status: DC

## 2018-03-29 MED ORDER — ACETAMINOPHEN 650 MG RE SUPP
650.0000 mg | Freq: Once | RECTAL | Status: AC
  Administered 2018-03-29: 650 mg via RECTAL

## 2018-03-29 MED ORDER — PROTAMINE SULFATE 10 MG/ML IV SOLN
INTRAVENOUS | Status: AC
  Filled 2018-03-29: qty 25

## 2018-03-29 MED ORDER — SODIUM CHLORIDE 0.9 % IV SOLN: INTRAVENOUS | Status: DC

## 2018-03-29 MED ORDER — HEMOSTATIC AGENTS (NO CHARGE) OPTIME
TOPICAL | Status: DC | PRN
  Administered 2018-03-29: 1 via TOPICAL

## 2018-03-29 MED ORDER — METOPROLOL TARTRATE 25 MG/10 ML ORAL SUSPENSION: 12.5000 mg | Freq: Two times a day (BID) | ORAL | Status: DC

## 2018-03-29 MED ORDER — SODIUM POLYSTYRENE SULFONATE 15 GM/60ML PO SUSP
45.0000 g | Freq: Once | ORAL | Status: AC
  Administered 2018-03-29: 45 g via ORAL
  Filled 2018-03-29: qty 180

## 2018-03-29 MED ORDER — PANTOPRAZOLE SODIUM 40 MG PO TBEC
40.0000 mg | DELAYED_RELEASE_TABLET | Freq: Every day | ORAL | Status: DC
  Administered 2018-03-31: 40 mg via ORAL
  Filled 2018-03-29: qty 1

## 2018-03-29 MED ORDER — ACETAMINOPHEN 160 MG/5ML PO SOLN: 650.0000 mg | Freq: Once | ORAL | Status: AC

## 2018-03-29 MED ORDER — PHENYLEPHRINE HCL 10 MG/ML IJ SOLN
INTRAMUSCULAR | Status: DC | PRN
  Administered 2018-03-29 (×10): 40 ug via INTRAVENOUS

## 2018-03-29 MED ORDER — PROPOFOL 10 MG/ML IV BOLUS
INTRAVENOUS | Status: DC | PRN
  Administered 2018-03-29: 50 mg via INTRAVENOUS

## 2018-03-29 MED ORDER — SODIUM CHLORIDE 0.45 % IV SOLN
INTRAVENOUS | Status: DC | PRN
Start: 2018-03-29 — End: 2018-03-31
  Administered 2018-03-29: 13:00:00 via INTRAVENOUS

## 2018-03-29 MED ORDER — LACTATED RINGERS IV SOLN: 500.0000 mL | Freq: Once | INTRAVENOUS | Status: DC | PRN

## 2018-03-29 MED ORDER — SODIUM CHLORIDE 0.9% FLUSH
3.0000 mL | Freq: Two times a day (BID) | INTRAVENOUS | Status: DC
  Administered 2018-03-30: 3 mL via INTRAVENOUS

## 2018-03-29 MED ORDER — ALBUMIN HUMAN 5 % IV SOLN
INTRAVENOUS | Status: DC | PRN
  Administered 2018-03-29 (×3): via INTRAVENOUS

## 2018-03-29 MED ORDER — ROCURONIUM BROMIDE 10 MG/ML (PF) SYRINGE
PREFILLED_SYRINGE | INTRAVENOUS | Status: AC
  Filled 2018-03-29: qty 10

## 2018-03-29 MED ORDER — SODIUM CHLORIDE 0.9% FLUSH
10.0000 mL | Freq: Two times a day (BID) | INTRAVENOUS | Status: DC
  Administered 2018-03-29: 20 mL
  Administered 2018-03-29 – 2018-03-30 (×2): 10 mL
  Administered 2018-03-30 – 2018-03-31 (×2): 20 mL
  Administered 2018-03-31 – 2018-04-01 (×2): 10 mL
  Administered 2018-04-01 – 2018-04-04 (×5): 20 mL
  Administered 2018-04-04: 10 mL
  Administered 2018-04-05: 20 mL
  Administered 2018-04-05 – 2018-04-06 (×2): 10 mL
  Administered 2018-04-06: 20 mL
  Administered 2018-04-07 – 2018-04-09 (×5): 10 mL
  Administered 2018-04-10: 20 mL
  Administered 2018-04-10: 10 mL
  Administered 2018-04-11 (×2): 20 mL
  Administered 2018-04-12 – 2018-04-14 (×5): 10 mL
  Administered 2018-04-15 – 2018-04-17 (×4): 20 mL
  Administered 2018-04-18: 10 mL
  Administered 2018-04-18: 20 mL
  Administered 2018-04-19: 10 mL
  Administered 2018-04-19: 20 mL
  Administered 2018-04-20 – 2018-04-21 (×2): 10 mL
  Administered 2018-04-21: 20 mL
  Administered 2018-04-22: 10 mL
  Administered 2018-04-22: 20 mL
  Administered 2018-04-23: 30 mL
  Administered 2018-04-24: 10 mL
  Administered 2018-04-24 – 2018-04-26 (×5): 20 mL
  Administered 2018-04-27 – 2018-04-28 (×3): 10 mL
  Administered 2018-04-28: 20 mL
  Administered 2018-04-29 – 2018-04-30 (×3): 10 mL
  Administered 2018-05-01: 20 mL
  Administered 2018-05-01 – 2018-05-02 (×3): 10 mL
  Administered 2018-05-03: 20 mL
  Administered 2018-05-03: 10 mL
  Administered 2018-05-04: 40 mL
  Administered 2018-05-04 – 2018-05-05 (×3): 20 mL
  Administered 2018-05-06 – 2018-05-07 (×3): 10 mL
  Administered 2018-05-08 (×2): 20 mL
  Administered 2018-05-09: 10 mL
  Administered 2018-05-09 – 2018-05-10 (×2): 20 mL

## 2018-03-29 MED ORDER — MUPIROCIN 2 % EX OINT
1.0000 "application " | TOPICAL_OINTMENT | Freq: Two times a day (BID) | CUTANEOUS | Status: AC
  Administered 2018-03-29 – 2018-04-03 (×10): 1 via NASAL
  Filled 2018-03-29 (×4): qty 22

## 2018-03-29 MED ORDER — LACTATED RINGERS IV SOLN: INTRAVENOUS | Status: DC

## 2018-03-29 MED ORDER — INSULIN REGULAR BOLUS VIA INFUSION
0.0000 [IU] | Freq: Three times a day (TID) | INTRAVENOUS | Status: DC
  Filled 2018-03-29: qty 10

## 2018-03-29 MED ORDER — HEPARIN SODIUM (PORCINE) 1000 UNIT/ML IJ SOLN
INTRAMUSCULAR | Status: AC
  Filled 2018-03-29: qty 1

## 2018-03-29 MED ORDER — THROMBIN 5000 UNITS EX SOLR
CUTANEOUS | Status: AC
  Filled 2018-03-29: qty 20000

## 2018-03-29 MED ORDER — POTASSIUM CHLORIDE 10 MEQ/50ML IV SOLN: 10.0000 meq | INTRAVENOUS | Status: AC

## 2018-03-29 MED ORDER — HEPARIN SODIUM (PORCINE) 1000 UNIT/ML IJ SOLN
INTRAMUSCULAR | Status: DC | PRN
  Administered 2018-03-29: 24000 [IU] via INTRAVENOUS

## 2018-03-29 MED ORDER — FUROSEMIDE 10 MG/ML IJ SOLN
20.0000 mg | Freq: Once | INTRAMUSCULAR | Status: AC
  Administered 2018-03-29: 20 mg via INTRAVENOUS
  Filled 2018-03-29: qty 2

## 2018-03-29 MED ORDER — PHENYLEPHRINE HCL-NACL 20-0.9 MG/250ML-% IV SOLN
0.0000 ug/min | INTRAVENOUS | Status: DC
  Administered 2018-03-29: 20 ug/min via INTRAVENOUS
  Filled 2018-03-29 (×2): qty 250

## 2018-03-29 MED ORDER — MIDAZOLAM HCL 10 MG/2ML IJ SOLN
INTRAMUSCULAR | Status: AC
  Filled 2018-03-29: qty 2

## 2018-03-29 MED ORDER — SODIUM CHLORIDE 0.9% FLUSH: 3.0000 mL | INTRAVENOUS | Status: DC | PRN

## 2018-03-29 MED ORDER — SODIUM CHLORIDE 0.9 % IV SOLN
600.0000 mg | Freq: Every day | INTRAVENOUS | Status: AC
  Administered 2018-03-29 – 2018-05-10 (×43): 600 mg via INTRAVENOUS
  Filled 2018-03-29 (×43): qty 12

## 2018-03-29 MED ORDER — EPHEDRINE SULFATE 50 MG/ML IJ SOLN
INTRAMUSCULAR | Status: AC
  Filled 2018-03-29: qty 1

## 2018-03-29 MED ORDER — CHLORHEXIDINE GLUCONATE CLOTH 2 % EX PADS
6.0000 | MEDICATED_PAD | Freq: Every day | CUTANEOUS | Status: DC
  Administered 2018-03-30 – 2018-04-29 (×5): 6 via TOPICAL

## 2018-03-29 MED ORDER — TRAMADOL HCL 50 MG PO TABS
50.0000 mg | ORAL_TABLET | ORAL | Status: DC | PRN
  Administered 2018-03-30: 100 mg via ORAL
  Filled 2018-03-29: qty 2

## 2018-03-29 MED ORDER — ALBUMIN HUMAN 5 % IV SOLN
250.0000 mL | INTRAVENOUS | Status: DC | PRN
  Administered 2018-03-29 (×2): 250 mL via INTRAVENOUS

## 2018-03-29 MED ORDER — PHENYLEPHRINE 40 MCG/ML (10ML) SYRINGE FOR IV PUSH (FOR BLOOD PRESSURE SUPPORT)
PREFILLED_SYRINGE | INTRAVENOUS | Status: AC
  Filled 2018-03-29: qty 10

## 2018-03-29 MED ORDER — OXYCODONE HCL 5 MG PO TABS
5.0000 mg | ORAL_TABLET | ORAL | Status: DC | PRN
  Administered 2018-03-29 – 2018-03-31 (×9): 10 mg via ORAL
  Filled 2018-03-29 (×9): qty 2

## 2018-03-29 MED ORDER — LIDOCAINE 2% (20 MG/ML) 5 ML SYRINGE
INTRAMUSCULAR | Status: AC
  Filled 2018-03-29: qty 5

## 2018-03-29 MED ORDER — CHLORHEXIDINE GLUCONATE 0.12 % MT SOLN
15.0000 mL | OROMUCOSAL | Status: AC
  Administered 2018-03-29: 15 mL via OROMUCOSAL
  Filled 2018-03-29: qty 15

## 2018-03-29 MED ORDER — EPHEDRINE SULFATE 50 MG/ML IJ SOLN
INTRAMUSCULAR | Status: DC | PRN
  Administered 2018-03-29: 5 mg via INTRAVENOUS

## 2018-03-29 MED ORDER — HEMOSTATIC AGENTS (NO CHARGE) OPTIME
TOPICAL | Status: DC | PRN
  Administered 2018-03-29 (×2): 1 via TOPICAL

## 2018-03-29 MED ORDER — CHLORHEXIDINE GLUCONATE 0.12% ORAL RINSE (MEDLINE KIT): 15.0000 mL | Freq: Two times a day (BID) | OROMUCOSAL | Status: DC

## 2018-03-29 MED ORDER — ONDANSETRON HCL 4 MG/2ML IJ SOLN
4.0000 mg | Freq: Four times a day (QID) | INTRAMUSCULAR | Status: DC | PRN
  Administered 2018-03-30 – 2018-03-31 (×3): 4 mg via INTRAVENOUS
  Filled 2018-03-29 (×3): qty 2

## 2018-03-29 MED ORDER — FENTANYL CITRATE (PF) 250 MCG/5ML IJ SOLN
INTRAMUSCULAR | Status: DC | PRN
  Administered 2018-03-29: 100 ug via INTRAVENOUS
  Administered 2018-03-29: 50 ug via INTRAVENOUS
  Administered 2018-03-29 (×2): 150 ug via INTRAVENOUS
  Administered 2018-03-29: 100 ug via INTRAVENOUS
  Administered 2018-03-29: 850 ug via INTRAVENOUS
  Administered 2018-03-29 (×2): 50 ug via INTRAVENOUS
  Administered 2018-03-29: 100 ug via INTRAVENOUS
  Administered 2018-03-29: 50 ug via INTRAVENOUS
  Administered 2018-03-29: 100 ug via INTRAVENOUS

## 2018-03-29 MED ORDER — MIDAZOLAM HCL 2 MG/2ML IJ SOLN
INTRAMUSCULAR | Status: AC
  Filled 2018-03-29: qty 2

## 2018-03-29 MED ORDER — SODIUM CHLORIDE 0.9 % IV SOLN
INTRAVENOUS | Status: DC
  Filled 2018-03-29: qty 1

## 2018-03-29 SURGICAL SUPPLY — 85 items
ADAPTER CARDIO PERF ANTE/RETRO (ADAPTER) ×3 IMPLANT
ATTRACTOMAT 16X20 MAGNETIC DRP (DRAPES) ×3 IMPLANT
BAG DECANTER FOR FLEXI CONT (MISCELLANEOUS) IMPLANT
BLADE STERNUM SYSTEM 6 (BLADE) ×3 IMPLANT
BLADE SURG 15 STRL LF DISP TIS (BLADE) ×4 IMPLANT
BLADE SURG 15 STRL SS (BLADE) ×2
CANISTER SUCT 3000ML PPV (MISCELLANEOUS) ×3 IMPLANT
CANN PRFSN 3/8X14X24FR PCFC (MISCELLANEOUS) ×2
CANN PRFSN 3/8XCNCT ST RT ANG (MISCELLANEOUS)
CANNULA ARTERIAL VENT 3/8 20FR (CANNULA) ×3 IMPLANT
CANNULA GUNDRY RCSP 15FR (MISCELLANEOUS) IMPLANT
CANNULA PRFSN 3/8X14X24FR PCFC (MISCELLANEOUS) ×2 IMPLANT
CANNULA PRFSN 3/8XCNCT RT ANG (MISCELLANEOUS) IMPLANT
CANNULA VEN MTL TIP RT (MISCELLANEOUS) ×1
CANNULA VRC MALB SNGL STG 34FR (MISCELLANEOUS) ×2 IMPLANT
CATH ROBINSON RED A/P 18FR (CATHETERS) ×9 IMPLANT
CATH THORACIC 36FR (CATHETERS) ×3 IMPLANT
CATH THORACIC 36FR RT ANG (CATHETERS) ×3 IMPLANT
CONN 1/2X1/2X1/2  BEN (MISCELLANEOUS) ×1
CONN 1/2X1/2X1/2 BEN (MISCELLANEOUS) ×2 IMPLANT
CONN 3/8X1/2 ST GISH (MISCELLANEOUS) ×6 IMPLANT
CONT SPEC 4OZ CLIKSEAL STRL BL (MISCELLANEOUS) ×6 IMPLANT
COVER SURGICAL LIGHT HANDLE (MISCELLANEOUS) ×6 IMPLANT
CRADLE DONUT ADULT HEAD (MISCELLANEOUS) ×3 IMPLANT
DRAPE SLUSH/WARMER DISC (DRAPES) ×3 IMPLANT
DRSG COVADERM 4X14 (GAUZE/BANDAGES/DRESSINGS) ×3 IMPLANT
ELECT CAUTERY BLADE 6.4 (BLADE) ×3 IMPLANT
ELECT REM PT RETURN 9FT ADLT (ELECTROSURGICAL) ×6
ELECTRODE REM PT RTRN 9FT ADLT (ELECTROSURGICAL) ×4 IMPLANT
FELT TEFLON 1X6 (MISCELLANEOUS) ×3 IMPLANT
FLOSEAL 5ML (HEMOSTASIS) ×3 IMPLANT
GAUZE SPONGE 4X4 12PLY STRL (GAUZE/BANDAGES/DRESSINGS) ×6 IMPLANT
GAUZE SPONGE 4X4 12PLY STRL LF (GAUZE/BANDAGES/DRESSINGS) ×3 IMPLANT
GLOVE BIO SURGEON STRL SZ 6 (GLOVE) ×3 IMPLANT
GLOVE BIO SURGEON STRL SZ 6.5 (GLOVE) IMPLANT
GLOVE BIO SURGEON STRL SZ7 (GLOVE) IMPLANT
GLOVE BIO SURGEON STRL SZ7.5 (GLOVE) IMPLANT
GLOVE BIOGEL PI IND STRL 6.5 (GLOVE) ×2 IMPLANT
GLOVE BIOGEL PI INDICATOR 6.5 (GLOVE) ×1
GLOVE EUDERMIC 7 POWDERFREE (GLOVE) ×6 IMPLANT
GOWN STRL REUS W/ TWL LRG LVL3 (GOWN DISPOSABLE) ×8 IMPLANT
GOWN STRL REUS W/ TWL XL LVL3 (GOWN DISPOSABLE) ×2 IMPLANT
GOWN STRL REUS W/TWL LRG LVL3 (GOWN DISPOSABLE) ×4
GOWN STRL REUS W/TWL XL LVL3 (GOWN DISPOSABLE) ×1
HEMOSTAT ARISTA ABSORB 3G PWDR (MISCELLANEOUS) ×6 IMPLANT
HEMOSTAT POWDER SURGIFOAM 1G (HEMOSTASIS) IMPLANT
HEMOSTAT SURGICEL 2X14 (HEMOSTASIS) IMPLANT
INSERT FOGARTY XLG (MISCELLANEOUS) IMPLANT
KIT BASIN OR (CUSTOM PROCEDURE TRAY) ×3 IMPLANT
KIT CATH CPB BARTLE (MISCELLANEOUS) IMPLANT
KIT SUCTION CATH 14FR (SUCTIONS) ×3 IMPLANT
KIT TURNOVER KIT B (KITS) ×3 IMPLANT
LINE VENT (MISCELLANEOUS) ×3 IMPLANT
NS IRRIG 1000ML POUR BTL (IV SOLUTION) ×18 IMPLANT
PACK E OPEN HEART (SUTURE) ×3 IMPLANT
PACK OPEN HEART (CUSTOM PROCEDURE TRAY) ×3 IMPLANT
PAD ARMBOARD 7.5X6 YLW CONV (MISCELLANEOUS) ×6 IMPLANT
SENSOR MYOCARDIAL TEMP (MISCELLANEOUS) ×3 IMPLANT
SET CARDIOPLEGIA MPS 5001102 (MISCELLANEOUS) ×3 IMPLANT
SUCKER WEIGHTED FLEX (MISCELLANEOUS) IMPLANT
SUT BONE WAX W31G (SUTURE) ×6 IMPLANT
SUT ETHIBON 2 0 V 52N 30 (SUTURE) ×9 IMPLANT
SUT ETHIBOND 2 0 SH (SUTURE) IMPLANT
SUT ETHIBOND 2 0 SH 36X2 (SUTURE) IMPLANT
SUT ETHIBOND 2 0 V4 (SUTURE) IMPLANT
SUT ETHIBOND 2 0V4 GREEN (SUTURE) IMPLANT
SUT PROLENE 3 0 SH 1 (SUTURE) ×3 IMPLANT
SUT PROLENE 3 0 SH 48 (SUTURE) IMPLANT
SUT PROLENE 4 0 RB 1 (SUTURE) ×3
SUT PROLENE 4 0 SH DA (SUTURE) IMPLANT
SUT PROLENE 4-0 RB1 .5 CRCL 36 (SUTURE) ×6 IMPLANT
SUT STEEL 6MS V (SUTURE) ×6 IMPLANT
SUT STEEL SZ 6 DBL 3X14 BALL (SUTURE) IMPLANT
SUT VIC AB 1 CTX 27 (SUTURE) ×6 IMPLANT
SUT VIC AB 1 CTX 36 (SUTURE) ×1
SUT VIC AB 1 CTX36XBRD ANBCTR (SUTURE) ×2 IMPLANT
SYSTEM SAHARA CHEST DRAIN ATS (WOUND CARE) ×3 IMPLANT
TOWEL GREEN STERILE (TOWEL DISPOSABLE) ×3 IMPLANT
TOWEL GREEN STERILE FF (TOWEL DISPOSABLE) ×3 IMPLANT
TRAY FOLEY SLVR 14FR TEMP STAT (SET/KITS/TRAYS/PACK) ×3 IMPLANT
UNDERPAD 30X30 (UNDERPADS AND DIAPERS) ×3 IMPLANT
VALVE MAGNA MITRAL 27MM (Prosthesis & Implant Heart) ×3 IMPLANT
VRC MALLEABLE SINGLE STG 34FR (MISCELLANEOUS) ×3
WATER STERILE IRR 1000ML POUR (IV SOLUTION) ×9 IMPLANT
YANKAUER SUCT BULB TIP NO VENT (SUCTIONS) ×3 IMPLANT

## 2018-03-29 NOTE — Anesthesia Procedure Notes (Signed)
Arterial Line Insertion Start/End8/12/2017 6:50 AM Performed by: Marcene DuosFitzgerald, Robert, MD, Hart RobinsonsByrd, Lauren R, CRNA, CRNA  Patient location: Pre-op. Preanesthetic checklist: patient identified, IV checked, site marked, risks and benefits discussed, surgical consent, monitors and equipment checked, pre-op evaluation, timeout performed and anesthesia consent Lidocaine 1% used for infiltration and patient sedated Left, radial was placed Catheter size: 20 G Hand hygiene performed  and maximum sterile barriers used   Attempts: 2 Procedure performed without using ultrasound guided technique. Following insertion, Biopatch and dressing applied. Post procedure assessment: normal  Patient tolerated the procedure well with no immediate complications.

## 2018-03-29 NOTE — Transfer of Care (Signed)
Immediate Anesthesia Transfer of Care Note  Patient: Tiffany Salazar  Procedure(s) Performed: TRICUSPID VALVE REPLACEMENT Using 27 mm Magna Ease Mitral Valve Pericardial Bioprosthesis (N/A Chest) TRANSESOPHAGEAL ECHOCARDIOGRAM (TEE) (N/A )  Patient Location: SICU  Anesthesia Type:General  Level of Consciousness: sedated and Patient remains intubated per anesthesia plan  Airway & Oxygen Therapy: Patient remains intubated per anesthesia plan and Patient placed on Ventilator (see vital sign flow sheet for setting)  Post-op Assessment: Report given to RN and Post -op Vital signs reviewed and stable  Post vital signs: Reviewed and stable  Last Vitals:  Vitals Value Taken Time  BP 155/103 03/29/2018 12:34 PM  Temp 36.5 C 03/29/2018 12:36 PM  Pulse 98 03/29/2018 12:36 PM  Resp 15 03/29/2018 12:36 PM  SpO2 100 % 03/29/2018 12:36 PM  Vitals shown include unvalidated device data.  Last Pain:  Vitals:   03/29/18 0524  TempSrc: Oral  PainSc: 0-No pain      Patients Stated Pain Goal: 0 (03/26/18 2021)  Complications: No apparent anesthesia complications

## 2018-03-29 NOTE — Progress Notes (Signed)
Notified Md about speaking with Cytology.  Md to call Cytology about specimen collected earlier. Karena Addisonoro, Raylan Hanton T

## 2018-03-29 NOTE — Anesthesia Procedure Notes (Signed)
Central Venous Catheter Insertion Performed by: Roderic Palau, MD, anesthesiologist Start/End8/12/2017 6:45 AM, 03/29/2018 7:00 AM Patient location: Pre-op. Preanesthetic checklist: patient identified, IV checked, site marked, risks and benefits discussed, surgical consent, monitors and equipment checked, pre-op evaluation, timeout performed and anesthesia consent Position: Trendelenburg Lidocaine 1% used for infiltration and patient sedated Hand hygiene performed , maximum sterile barriers used  and Seldinger technique used Catheter size: 9 Fr Total catheter length 10. Central line was placed.MAC introducer Swan type:thermodilution Procedure performed using ultrasound guided technique. Ultrasound Notes:anatomy identified, needle tip was noted to be adjacent to the nerve/plexus identified, no ultrasound evidence of intravascular and/or intraneural injection and image(s) printed for medical record Attempts: 1 Following insertion, line sutured, dressing applied and Biopatch. Post procedure assessment: blood return through all ports, free fluid flow and no air  Patient tolerated the procedure well with no immediate complications.

## 2018-03-29 NOTE — Anesthesia Postprocedure Evaluation (Signed)
Anesthesia Post Note  Patient: Tiffany Salazar [name]  Procedure(s) Performed: TRICUSPID VALVE REPLACEMENT Using 27 mm Magna Ease Mitral Valve Pericardial Bioprosthesis (N/A Chest) TRANSESOPHAGEAL ECHOCARDIOGRAM (TEE) (N/A )     Patient location during evaluation: SICU Anesthesia Type: General Level of consciousness: sedated Pain management: pain level controlled Vital Signs Assessment: post-procedure vital signs reviewed and stable Respiratory status: patient remains intubated per anesthesia plan Cardiovascular status: stable Postop Assessment: no apparent nausea or vomiting Anesthetic complications: no    Last Vitals:  Vitals:   03/29/18 0717 03/29/18 1225  BP:  (!) 154/87  Pulse: 83 97  Resp: (!) 21 12  Temp:    SpO2: 99% 100%    Last Pain:  Vitals:   03/29/18 0524  TempSrc: Oral  PainSc: 0-No pain                 Anjannette Gauger,W. EDMOND

## 2018-03-29 NOTE — Anesthesia Procedure Notes (Signed)
Procedure Name: Intubation Date/Time: 03/29/2018 7:42 AM Performed by: Hart RobinsonsByrd, Madlynn Lundeen R, CRNA Pre-anesthesia Checklist: Patient identified, Emergency Drugs available, Suction available, Patient being monitored and Timeout performed Patient Re-evaluated:Patient Re-evaluated prior to induction Oxygen Delivery Method: Circle system utilized Preoxygenation: Pre-oxygenation with 100% oxygen Induction Type: IV induction Ventilation: Mask ventilation without difficulty Laryngoscope Size: Miller and 2 Grade View: Grade I Tube size: 8.0 mm Number of attempts: 1 Airway Equipment and Method: Stylet Placement Confirmation: ETT inserted through vocal cords under direct vision,  positive ETCO2,  CO2 detector and breath sounds checked- equal and bilateral Secured at: 22 cm Tube secured with: Tape Dental Injury: Teeth and Oropharynx as per pre-operative assessment  Comments: Dentition same as preprocedure

## 2018-03-29 NOTE — Progress Notes (Signed)
[facility] for Infectious Disease    Date of Admission:  03/20/2018   Total days of antibiotics 10           ID: Tiffany Salazar is a 37 y.o. female with  MRSA TV endocarditis c/b pulmonary septic emboli POD#0 s/p TRICUSPID VALVE REPLACEMENT Using 27 mm Magna Ease Mitral Valve Pericardial Bioprosthesis (N/A)   Principal Problem:   Endocarditis of tricuspid valve Active Problems:   Polysubstance abuse (HCC)   IVDU (intravenous drug user)   MRSA bacteremia   Acute bacterial endocarditis   Hepatitis C antibody positive in blood    Subjective: Tolerated surgery well TRICUSPID VALVE REPLACEMENT Using 27 mm Magna Ease Mitral Valve Pericardial Bioprosthesis (N/A)-remains on low dose neo post surgery    Medications:  . [START ON 03/30/2018] acetaminophen  1,000 mg Oral Q6H   Or  . [START ON 03/30/2018] acetaminophen (TYLENOL) oral liquid 160 mg/5 mL  1,000 mg Per Tube Q6H  . acetaminophen (TYLENOL) oral liquid 160 mg/5 mL  650 mg Per Tube Once   Or  . acetaminophen  650 mg Rectal Once  . [START ON 03/30/2018] aspirin EC  325 mg Oral Daily   Or  . [START ON 03/30/2018] aspirin  324 mg Per Tube Daily  . [START ON 03/30/2018] bisacodyl  10 mg Oral Daily   Or  . [START ON 03/30/2018] bisacodyl  10 mg Rectal Daily  . buprenorphine-naloxone  1 tablet Sublingual Daily  . busPIRone  15 mg Oral BID  . chlorhexidine gluconate (MEDLINE KIT)  15 mL Mouth Rinse BID  . [START ON 03/30/2018] Chlorhexidine Gluconate Cloth  6 each Topical Daily  . [START ON 03/30/2018] docusate sodium  200 mg Oral Daily  . insulin aspart  0-24 Units Subcutaneous Q4H  . mouth rinse  15 mL Mouth Rinse 10 times per day  . metoprolol tartrate  12.5 mg Oral BID   Or  . metoprolol tartrate  12.5 mg Per Tube BID  . [START ON 03/31/2018] pantoprazole  40 mg Oral Daily  . sodium chloride flush  10-40 mL Intracatheter Q12H  . [START ON 03/30/2018] sodium chloride flush  3 mL Intravenous Q12H  . sulfamethoxazole-trimethoprim  1  tablet Oral Q12H    Objective: Vital signs in last 24 hours: Temp:  [97.7 F (36.5 C)-99.3 F (37.4 C)] 98.4 F (36.9 C) (08/05 1445) Pulse Rate:  [81-102] 83 (08/05 1445) Resp:  [0-27] 12 (08/05 1445) BP: (88-155)/(60-103) 90/67 (08/05 1430) SpO2:  [95 %-100 %] 100 % (08/05 1445) Arterial Line BP: (89-163)/(44-91) 106/51 (08/05 1445) FiO2 (%):  [50 %] 50 % (08/05 1225) Weight:  [150 lb 3.2 oz (68.1 kg)] 150 lb 3.2 oz (68.1 kg) (08/05 0524) Physical Exam  Constitutional:  Opens eyes to verbal stimuli. appears well-developed and well-nourished. No distress.  HENT: [place]/AT, PERRLA, no scleral icterus Mouth/Throat: Oropharynx is clear and moist. No oropharyngeal exudate.  Neck: ij in place Cardiovascular: Normal rate, regular rhythm and normal heart sounds. Exam reveals no gallop and no friction rub.  No murmur heard.  Pulmonary/Chest: Effort normal and breath sounds[ - c/w rhonchi bilaterally Abdominal: Soft. Bowel sounds are normal.  exhibits no distension. There is no tenderness.  Neurological: alert and oriented to person, place, and time.  Skin: Skin is warm and dry. No rash noted. No erythema.  Psychiatric: sleepy from anesthesia   Lab Results Recent Labs    03/28/18 0421 03/29/18 0530  03/29/18 1021  03/29/18 1116 03/29/18 1240  03/29/18 1252  WBC 10.8* 12.0*  --   --   --   --   --  27.1*  HGB 8.6* 9.1*   < > 8.5*   < > 8.8* 9.2* 8.7*  HCT 27.8* 29.3*   < > 25.0*   < > 26.0* 27.0* 27.4*  NA 137 138   < > 137  --  137 138  --   K 5.3* 5.0   < > 4.7  --  5.2* 5.1  --   CL 107 106   < > 104  --  105  --   --   CO2 24 25  --   --   --   --   --   --   BUN 24* 20   < > 17  --  16  --   --   CREATININE 1.53* 1.46*   < > 1.00  --  1.10*  --   --    < > = values in this interval not displayed.    Microbiology: Tissue cx pending Studies/Results: Dg Chest Port 1 View  Result Date: 03/29/2018 CLINICAL DATA:  Tricuspid valve replacement. EXAM: PORTABLE CHEST 1 VIEW  COMPARISON:  Chest x-ray dated March 26, 2018. FINDINGS: Endotracheal tube in place with the tip approximately 3.3 cm above the carina. Enteric tube tip in the stomach with the proximal side port in the distal esophagus. Mediastinal chest tubes in place. Right upper extremity PICC line with tip at the cavoatrial junction. The heart size and mediastinal contours are within normal limits. Interval tricuspid valve replacement. Normal pulmonary vascularity. Patchy cavitary and non cavitary nodular densities in both lungs, similar to prior study. No pleural effusion or pneumothorax. No acute osseous abnormality. IMPRESSION: 1. Interval tricuspid valve replacement. Enteric tube proximal side port in the distal esophagus. Recommend advancement. 2. Other support apparatus appropriately positioned. 3. Relatively unchanged bilateral septic emboli. Electronically Signed   By: Titus Dubin M.D.   On: 03/29/2018 12:56     Assessment/Plan: MRSA TV endocarditis s/p TRICUSPID VALVE REPLACEMENT Using 27 mm Magna Ease Mitral Valve Pericardial Bioprosthesis   - plan on restarting antibiotic clock. Today will be day 1 of daptomycin - recommend to dose at minimum of 70m/kg  Pulmonary septic emboli = will continue with bactrim - may consider going back to vancomycin if renal function returns back to normal  ckd 2 = improved. Continue to monitor  Leukocytosis = stress from surgery. Will continue to monitor.   Opiate dependence = may have higher needs given recent surgery. Has prn meds available.   [facility] Hospital[facility] Cell: [phone]: [phone]  03/29/2018, 3:45 PM

## 2018-03-29 NOTE — Progress Notes (Signed)
Pharmacy Antibiotic Note  [name] is a 37 y.o. female admitted on 03/20/2018 with endocarditis.  Pharmacy has been consulted for daptomycin dosing. [place] to 7-26 for tricuspid endocarditis, MRSA bacteremia and pulmonary septic emboli. On 7/14 she developed a rash that was thought to be caused by Vancomycin and was changed to Daptomycin at that time.  [facility]   Patient currently continues on Daptomycin for known tricuspid valve endocarditis. SCr down to 1.10  and patient is afebrile. S/P valve replacement today. Continue Dapto/Bactrim for pulmonary emboli.        Plan: Daptomycin 600 mg IV Q 24 hours (~8.8 mg/kg) daily  Weekly CK on Thursdays   Height: 5\' 7"  (170.2 cm) Weight: 150 lb 3.2 oz (68.1 kg) IBW/kg (Calculated) : 61.6  Temp (24hrs), Avg:98.3 F (36.8 C), Min:97.7 F (36.5 C), Max:99.3 F (37.4 C)  Recent Labs  Lab 03/27/18 0624 03/27/18 1637 03/28/18 0421 03/29/18 0530 03/29/18 0754 03/29/18 0857 03/29/18 0922 03/29/18 1021 03/29/18 1116 03/29/18 1252  WBC 12.2* 12.0* 10.8* 12.0*  --   --   --   --   --  27.1*  CREATININE 2.40* 2.05* 1.53* 1.46* 1.30* 1.30* 1.20* 1.00 1.10*  --     Estimated Creatinine Clearance: 69.4 mL/min (A) (by C-G formula based on SCr of 1.1 mg/dL (H)).    Allergies  Allergen Reactions  . Gentamicin Rash  . Vancomycin Rash    Antimicrobials this admission: Daptomycin 7/27 >>  Bacrtim 7/28 >>    Microbiology results: 7/28 BCx: ngtd   Thank you for allowing pharmacy to be a part of this patient's care.  Sharin MonsEmily Rashae Rother, PharmD, BCPS Antimicrobial Stewardship Clinical Pharmacist Phone:[phone] 03/29/2018 4:34 PM

## 2018-03-29 NOTE — Op Note (Signed)
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 Median Sternotomy 2. Extracorporeal circulation 3. Tricuspid valve replacement using a 27 mm Edwards Magna-Ease pericardial valve   Anesthesia: General Endotracheal   Clinical History/Surgical Indication:  The patient is a 37 year old woman with a history of snorting and intravenous heroin abuse who said that she drove to [place] earlier this month and was starting to feel like something was wrong with tiredness and fatigue.  She was in [place] longer than she planned and then began driving home but felt progressively worse and stopped in [place] where she was admitted to the hospital from 03/10/2018 to 03/19/2018 with a diagnosis of MRSA bacteremia and tricuspid valve endocarditis with septic pulmonary emboli.  She was initially treated with vancomycin and gentamicin but developed a drug rash and vancomycin/gentamicin was switched to daptomycin.  She was treated with Benadryl and low-dose Solu-Medrol and had gradual improvement in her rash but has had her skin slough.  Follow-up blood cultures were reportedly negative.  The patient left AMA to come back to [place] for further treatment on 03/19/2018.  She was seen by infectious disease and cardiology here.  Bactrim was added to her regimen in addition to daptomycin.  She underwent TEE yesterday which showed normal left ventricular systolic function with ejection fraction of 55 to 60%.  The tricuspid valve showed multiple vegetations on the posterior and septal leaflets measuring 0.5 x 1 cm and seem to be prolapsing in and out of the tricuspid valve plane.  There was severe wide-open regurgitation.  There were no signs of vegetation on the other heart valves.  A  chest x-ray on 03/20/2018 showed numerous focal patchy nodular opacities throughout both lungs likely representing septic pulmonary emboli.  Follow-up blood cultures here have been negative. CTA of the chest showed multiple bilateral septic pumonary emboli with cavitary formation. She was seen by dental medicine and was not felt to have any acute contraindication to proceeding with surgery. I discussed the operative procedure with the patient  including alternatives, benefits and risks; including but not limited to bleeding, blood transfusion, infection, stroke, myocardial infarction, graft failure, heart block requiring a permanent pacemaker, organ dysfunction, and death.  Despina Pole understands and agrees to proceed.     Preparation:  The patient was seen in the preoperative holding area and the correct patient, correct operation were confirmed with the patient after reviewing the medical record and catheterization. The consent was signed by me. Preoperative antibiotics were given. A pulmonary arterial line was not placed so that it would not be coursing through the right atrium. A radial arterial line were placed by the anesthesia team. The patient was taken back to the operating room and positioned supine on the operating room table. After being placed under general endotracheal anesthesia by the anesthesia team a foley catheter was placed. The neck, chest, abdomen, and both legs were prepped with betadine soap and solution and draped in the usual sterile manner. A surgical time-out was taken and the correct patient and operative procedure were confirmed with the nursing and anesthesia staff.   TEE: Performed by Dr. Marguerita Merles. This showed multiple large vegetations on the tricuspid valve that were highly mobile with severe TR. There was mild LV dilation with mild global hypokinesis. The RV was also mildly  dilated and hypokinetic. No vegetations were seen on the aortic or mitral  valves.   Cardiopulmonary Bypass:  A  median sternotomy was performed. The pericardium was opened in the midline. There were very dense adhesions. Right ventricular function appeared normal. The ascending aorta was of normal size and had no palpable plaque. There were no contraindications to aortic cannulation or cross-clamping. The patient was fully systemically heparinized and the ACT was maintained > 400 sec. The proximal aortic arch was cannulated with a 20 F aortic cannula for arterial inflow. Bi-caval venous cannulation was performed using a 24 [place] metal-tipped right angle cannula placed into the superior vena cava and a 34 [place] plastic malleable cannula placed into the inferior vena cava. An antegrade cardioplegia/vent cannula was inserted into the mid-ascending aorta. Aortic occlusion was performed with a single cross-clamp. Systemic cooling to 32 degrees Centigrade and topical cooling of the heart with iced saline were used. Hyperkalemic antegrade cold blood cardioplegia was used to induce diastolic arrest and was then given at about 20 minute intervals throughout the period of arrest to maintain myocardial temperature at or below 10 degrees centigrade. A temperature probe was inserted into the interventricular septum and an insulating pad was placed in the pericardium.   Tricuspid Valve Replacement:  The patient was placed on cardiopulmonary bypass.  The aorta was clamped and cardioplegia given as noted above.  The superior vena cava and inferior vena cava were encircled with tapes.  The right atrium was opened obliquely. A Koros retractor was used to expose the valve and exposure was excellent. The tricuspid valve had large vegetations on the atrial and ventricular side with destruction of the septal and posterior leaflets.  The tricuspid valve leaflets with the attached vegetations were excised.  I was able to leave a small rim of the septal leaflet along the expected course of the AV  conduction system.  The annulus was sized and a 27 mm Edwards Magna-Ease pericardial valve was chosen. This had model number [id number] and serial number [id number]. Then pledgetted 2-0 Ethibond sutures were placed around the annulus with the pledgets in a supra-annular position.  Along the medial half of the septal leaflet and the area of the septal/anterior commissure the sutures were placed through the remnant of the leaflet tissue to avoid the conduction system.  The sutures were placed through the valve sewing ring and the valve lowered into place. The sutures were tied and the valve seated nicely.  The atrium was closed in two layers with 4-0 prolene suture.   Completion:  The patient was rewarmed to 37 degrees Centigrade. The crossclamp was removed with a time of 76 minutes. There was spontaneous return of sinus rhythm. Two temporary epicardial pacing wires were placed on the right atrium and two on the right ventricle. The patient was weaned from CPB without difficulty on no inotropes. CPB time was 95 minutes. TEE showed a normally functioning tricuspid valve with trivial central regurgitation but no perivalvular leak. Heparin was fully reversed with protamine and the aortic and venous cannulas removed. Hemostasis was achieved. Mediastinal drainage tube was placed. The sternum was closed with #6 stainless steel wires. The fascia was closed with continuous # 1 vicryl suture. The subcutaneous tissue was closed with 2-0 vicryl continuous suture. The skin was closed with 3-0 vicryl subcuticular suture. All sponge, needle, and instrument counts were reported correct at the end of the case. Dry sterile dressings were placed over the incisions and around the chest tubes which were connected  to pleurevac suction. The patient was then transported to the surgical intensive care unit in stable condition.

## 2018-03-29 NOTE — Progress Notes (Signed)
CT surgery p.m. Rounds  Patient stable after tricuspid valve replacement Sinus rhythm Extubated Minimal chest tube drainage Postop hemoglobin 8.0

## 2018-03-29 NOTE — Anesthesia Procedure Notes (Signed)
Central Venous Catheter Insertion Performed by: Gaynelle AduFitzgerald, Zacariah Belue, MD, anesthesiologist Start/End8/12/2017 6:45 AM, 03/29/2018 7:00 AM Patient location: Pre-op. Preanesthetic checklist: patient identified, IV checked, site marked, risks and benefits discussed, surgical consent, monitors and equipment checked, pre-op evaluation, timeout performed and anesthesia consent Hand hygiene performed  and maximum sterile barriers used  PA cath was placed.Swan type:thermodilution PA Cath depth:20 Procedure performed without using ultrasound guided technique. Attempts: 1 Patient tolerated the procedure well with no immediate complications.

## 2018-03-29 NOTE — Procedures (Signed)
Extubation Procedure Note  Patient Details:   Name: Tiffany Salazar DOB: 09/06/1980 MRN: [id number]   Airway Documentation:    Vent end date: 03/29/18 Vent end time: 1755   Evaluation  O2 sats: stable throughout Complications: No apparent complications Patient did tolerate procedure well. Bilateral Breath Sounds: Rhonchi   Yes   Patient was extubated to a 4L [place] without any complications, dyspnea or stridor noted. Patient was instructed on IS, highest goal achieved was 500mL. NIF: -30, VC: 1.2L.  Tiffany Salazar, Tiffany Salazar 03/29/2018, 5:55 PM

## 2018-03-29 NOTE — Brief Op Note (Signed)
03/20/2018 - 03/29/2018  2:32 PM  PATIENT:  [name]  37 y.o. female  PRE-OPERATIVE DIAGNOSIS:  SEVERE TR TV ENDOCARDITIS  POST-OPERATIVE DIAGNOSIS:  SEVERE TR TV ENDOCARDITIS  PROCEDURE:  Procedure(s): TRICUSPID VALVE REPLACEMENT Using 27 mm Magna Ease Mitral Valve Pericardial Bioprosthesis (N/A) TRANSESOPHAGEAL ECHOCARDIOGRAM (TEE) (N/A)  SURGEON:  Surgeon(s) and Role:    * Bartle, Payton DoughtyBryan K, MD - Primary  PHYSICIAN ASSISTANT: WAYNE GOLD PA-C  ANESTHESIA:   general  EBL:  750 mL   BLOOD ADMINISTERED:none  DRAINS: PLEURAL AND PERICARDIAL CHEST TUBES   LOCAL MEDICATIONS USED:  NONE  SPECIMEN:  Source of Specimen:  TRICUSPID LEAFLETS  DISPOSITION OF SPECIMEN:  PATHOLOGY  COUNTS:  YES  TOURNIQUET:  * No tourniquets in log *  DICTATION: .Dragon Dictation  PLAN OF CARE: Admit to inpatient   PATIENT DISPOSITION:  ICU - intubated and hemodynamically stable.   Delay start of Pharmacological VTE agent (>24hrs) due to surgical blood loss or risk of bleeding: yes  COMPLICATIONS: NO KNOWN

## 2018-03-30 ENCOUNTER — Encounter (HOSPITAL_COMMUNITY): Payer: Self-pay | Admitting: Surgery

## 2018-03-30 ENCOUNTER — Inpatient Hospital Stay (HOSPITAL_COMMUNITY): Payer: Medicaid Other

## 2018-03-30 LAB — CBC
HCT: 26.6 % — ABNORMAL LOW (ref 36.0–46.0)
Hemoglobin: 8.6 g/dL — ABNORMAL LOW (ref 12.0–15.0)
MCH: 30.2 pg (ref 26.0–34.0)
MCHC: 32.3 g/dL (ref 30.0–36.0)
MCV: 93.3 fL (ref 78.0–100.0)
Platelets: 233 10*3/uL (ref 150–400)
RBC: 2.85 MIL/uL — ABNORMAL LOW (ref 3.87–5.11)
RDW: 14.9 % (ref 11.5–15.5)
WBC: 19.3 10*3/uL — ABNORMAL HIGH (ref 4.0–10.5)

## 2018-03-30 LAB — POCT I-STAT, CHEM 8
BUN: 14 mg/dL (ref 6–20)
Calcium, Ion: 1.19 mmol/L (ref 1.15–1.40)
Chloride: 93 mmol/L — ABNORMAL LOW (ref 98–111)
Creatinine, Ser: 1 mg/dL (ref 0.44–1.00)
Glucose, Bld: 130 mg/dL — ABNORMAL HIGH (ref 70–99)
HCT: 26 % — ABNORMAL LOW (ref 36.0–46.0)
Hemoglobin: 8.8 g/dL — ABNORMAL LOW (ref 12.0–15.0)
Potassium: 4.6 mmol/L (ref 3.5–5.1)
Sodium: 134 mmol/L — ABNORMAL LOW (ref 135–145)
TCO2: 27 mmol/L (ref 22–32)

## 2018-03-30 LAB — BASIC METABOLIC PANEL
Anion gap: 10 (ref 5–15)
BUN: 13 mg/dL (ref 6–20)
CO2: 25 mmol/L (ref 22–32)
Calcium: 8.3 mg/dL — ABNORMAL LOW (ref 8.9–10.3)
Chloride: 96 mmol/L — ABNORMAL LOW (ref 98–111)
Creatinine, Ser: 1.12 mg/dL — ABNORMAL HIGH (ref 0.44–1.00)
GFR calc Af Amer: >60 mL/min (ref >60)
GFR calc non Af Amer: >60 mL/min (ref >60)
Glucose, Bld: 124 mg/dL — ABNORMAL HIGH (ref 70–99)
Potassium: 4.8 mmol/L (ref 3.5–5.1)
Sodium: 131 mmol/L — ABNORMAL LOW (ref 135–145)

## 2018-03-30 LAB — GLUCOSE, CAPILLARY
Glucose-Capillary: 121 mg/dL — ABNORMAL HIGH (ref 70–99)
Glucose-Capillary: 120 mg/dL — ABNORMAL HIGH (ref 70–99)
Glucose-Capillary: 110 mg/dL — ABNORMAL HIGH (ref 70–99)

## 2018-03-30 LAB — MAGNESIUM: Magnesium: 2.5 mg/dL — ABNORMAL HIGH (ref 1.7–2.4)

## 2018-03-30 LAB — POTASSIUM: Potassium: 4.8 mmol/L (ref 3.5–5.1)

## 2018-03-30 MED ORDER — MUSCLE RUB 10-15 % EX CREA
TOPICAL_CREAM | CUTANEOUS | Status: AC | PRN
  Administered 2018-03-30: 1 via TOPICAL
  Filled 2018-03-30: qty 85

## 2018-03-30 MED ORDER — NICOTINE 21 MG/24HR TD PT24
21.0000 mg | MEDICATED_PATCH | Freq: Every day | TRANSDERMAL | Status: DC
  Administered 2018-03-30 – 2018-05-10 (×42): 21 mg via TRANSDERMAL
  Filled 2018-03-30 (×41): qty 1

## 2018-03-30 MED ORDER — BUPRENORPHINE HCL-NALOXONE HCL 8-2 MG SL SUBL
1.0000 | SUBLINGUAL_TABLET | Freq: Two times a day (BID) | SUBLINGUAL | Status: DC
  Administered 2018-03-30: 1 via SUBLINGUAL
  Filled 2018-03-30: qty 1

## 2018-03-30 NOTE — Progress Notes (Signed)
[facility]    Date of Admission:  03/20/2018     ID: Tiffany Salazar is a 37 y.o. female with MRSA TV endocarditis s/p TV replacement Principal Problem:   Endocarditis of tricuspid valve Active Problems:   Polysubstance abuse (HCC)   IVDU (intravenous drug user)   MRSA bacteremia   Acute bacterial endocarditis   Hepatitis C antibody positive in blood    Subjective: Feels like she is still having significant pain.  Medications:  . acetaminophen  1,000 mg Oral Q6H   Or  . acetaminophen (TYLENOL) oral liquid 160 mg/5 mL  1,000 mg Per Tube Q6H  . aspirin EC  325 mg Oral Daily   Or  . aspirin  324 mg Per Tube Daily  . bisacodyl  10 mg Oral Daily   Or  . bisacodyl  10 mg Rectal Daily  . buprenorphine-naloxone  1 tablet Sublingual BID  . busPIRone  15 mg Oral BID  . Chlorhexidine Gluconate Cloth  6 each Topical Daily  . docusate sodium  200 mg Oral Daily  . mouth rinse  15 mL Mouth Rinse BID  . metoprolol tartrate  12.5 mg Oral BID   Or  . metoprolol tartrate  12.5 mg Per Tube BID  . mupirocin ointment  1 application Nasal BID  . [START ON 03/31/2018] pantoprazole  40 mg Oral Daily  . sodium chloride flush  10-40 mL Intracatheter Q12H  . sodium chloride flush  3 mL Intravenous Q12H  . sulfamethoxazole-trimethoprim  1 tablet Oral Q12H    Objective: Vital signs in last 24 hours: Temp:  [98.4 F (36.9 C)-101.3 F (38.5 C)] 99.1 F (37.3 C) (08/06 1200) Pulse Rate:  [73-97] 88 (08/06 1320) Resp:  [8-38] 22 (08/06 1320) BP: (94-153)/(54-93) 116/74 (08/06 1320) SpO2:  [93 %-100 %] 94 % (08/06 1320) Arterial Line BP: (86-149)/(43-88) 110/61 (08/06 0900) FiO2 (%):  [40 %-50 %] 40 % (08/05 1715) Weight:  [152 lb 1.9 oz (69 kg)] 152 lb 1.9 oz (69 kg) (08/06 0600)  Physical Exam  Constitutional:  oriented to person, place, and time. appears well-developed and well-nourished. No distress.  HENT: [place]/AT, PERRLA, no scleral icterus Mouth/Throat:  Oropharynx is clear and moist. No oropharyngeal exudate.  Cardiovascular: Normal rate, regular rhythm and normal heart sounds. Exam reveals no gallop and no friction rub.  No murmur heard. Midline incision covered Pulmonary/Chest: Effort normal and breath sounds normal. No respiratory distress.  has no wheezes.  Neck = supple, no nuchal rigidity Abdominal: Soft. Bowel sounds are normal.  exhibits no distension. There is no tenderness.  Lymphadenopathy: no cervical adenopathy. No axillary adenopathy Neurological: alert and oriented to person, place, and time.  Skin: Skin is warm and dry. No rash noted. No erythema.  Psychiatric: a normal mood and affect.  behavior is normal.    Lab Results Recent Labs    03/29/18 0530  03/29/18 1825 03/29/18 1835 03/29/18 2347 03/30/18 0341  WBC 12.0*   < > 22.2*  --   --  19.3*  HGB 9.1*   < > 8.0* 8.2*  --  8.6*  HCT 29.3*   < > 25.9* 24.0*  --  26.6*  NA 138   < >  --  134*  --  131*  K 5.0   < > 6.4* 6.4* 4.8 4.8  CL 106   < >  --  103  --  96*  CO2 25  --   --   --   --  25  BUN 20   < >  --  16  --  13  CREATININE 1.46*   < > 1.14* 1.10*  --  1.12*   < > = values in this interval not displayed.    Microbiology:  Studies/Results: Dg Chest Port 1 View  Result Date: 03/30/2018 CLINICAL DATA:  Status post tricuspid valve replacement EXAM: PORTABLE CHEST 1 VIEW COMPARISON:  Portable chest x-ray of March 29, 2018 FINDINGS: The lungs are less well inflated today. There are bibasilar infiltrates as well as patchy density along the lateral aspect of the minor fissure on the right. There is no large pleural effusion. The cardiac silhouette is enlarged. The prosthetic tricuspid valve appears to be in stable position. The pulmonary vascularity is normal. The trachea and esophagus have been extubated. The mediastinal drain, left lower chest tube,, right internal jugular Cordis sheath, and right-sided PICC line are in stable position. IMPRESSION: Bilateral  hypoinflation. Bibasilar atelectasis or pneumonia. Confluent airspace opacity peripherally in the right mid lung is stable and worrisome for pneumonia as well. The remaining support tubes are in reasonable position. Electronically Signed   By: David  [place] M.D.   On: 03/30/2018 07:28   Dg Chest Port 1 View  Result Date: 03/29/2018 CLINICAL DATA:  Tricuspid valve replacement. EXAM: PORTABLE CHEST 1 VIEW COMPARISON:  Chest x-ray dated March 26, 2018. FINDINGS: Endotracheal tube in place with the tip approximately 3.3 cm above the carina. Enteric tube tip in the stomach with the proximal side port in the distal esophagus. Mediastinal chest tubes in place. Right upper extremity PICC line with tip at the cavoatrial junction. The heart size and mediastinal contours are within normal limits. Interval tricuspid valve replacement. Normal pulmonary vascularity. Patchy cavitary and non cavitary nodular densities in both lungs, similar to prior study. No pleural effusion or pneumothorax. No acute osseous abnormality. IMPRESSION: 1. Interval tricuspid valve replacement. Enteric tube proximal side port in the distal esophagus. Recommend advancement. 2. Other support apparatus appropriately positioned. 3. Relatively unchanged bilateral septic emboli. Electronically Signed   By: Obie Dredge M.D.   On: 03/29/2018 12:56     Assessment/Plan: - will increase suboxone to twice a day. May still need Iv and addn oral opiates to bridge acute post op pain.  MRSA TV endocarditis = continue on daptomycin 600mg  daily (roughly 8.8mg /kg)  MRSA pulmonary septic emboli/cavitary lung disease= continue on bactrim   [facility] Cell: [phone] Pager: [phone]  03/30/2018, 2:31 PM

## 2018-03-30 NOTE — Progress Notes (Signed)
Patient ID: Tiffany Salazar [name], female   DOB: 10/27/1980, 37 y.o.   MRN: [id number] TCTS Evening Rounds:  Hemodynamically stable in sinus rhythm  Good urine output  BMET    Component Value Date/Time   NA 134 (L) 03/30/2018 1707   NA 141 12/26/2013 0416   K 4.6 03/30/2018 1707   K 4.0 12/26/2013 0416   CL 93 (L) 03/30/2018 1707   CL 108 (H) 12/26/2013 0416   CO2 25 03/30/2018 0341   CO2 28 12/26/2013 0416   GLUCOSE 130 (H) 03/30/2018 1707   GLUCOSE 116 (H) 12/26/2013 0416   BUN 14 03/30/2018 1707   BUN 16 12/26/2013 0416   CREATININE 1.00 03/30/2018 1707   CREATININE 1.08 12/26/2013 0416   CALCIUM 8.3 (L) 03/30/2018 0341   CALCIUM 8.6 12/26/2013 0416   GFRNONAA >60 03/30/2018 0341   GFRNONAA >60 12/26/2013 0416   GFRAA >60 03/30/2018 0341   GFRAA >60 12/26/2013 0416   CBC    Component Value Date/Time   WBC 19.3 (H) 03/30/2018 0341   RBC 2.85 (L) 03/30/2018 0341   HGB 8.8 (L) 03/30/2018 1707   HGB 11.7 06/05/2016 1313   HCT 26.0 (L) 03/30/2018 1707   HCT 36.0 06/05/2016 1313   PLT 233 03/30/2018 0341   PLT 153 06/05/2016 1313   MCV 93.3 03/30/2018 0341   MCV 98 (H) 06/05/2016 1313   MCV 99 12/26/2013 0416   MCH 30.2 03/30/2018 0341   MCHC 32.3 03/30/2018 0341   RDW 14.9 03/30/2018 0341   RDW 13.4 06/05/2016 1313   RDW 14.6 (H) 12/26/2013 0416   LYMPHSABS 3.0 03/20/2018 1828   LYMPHSABS 2.1 06/05/2016 1313   MONOABS 0.9 03/20/2018 1828   EOSABS 0.2 03/20/2018 1828   EOSABS 0.2 06/05/2016 1313   BASOSABS 0.0 03/20/2018 1828   BASOSABS 0.0 06/05/2016 1313

## 2018-03-30 NOTE — Progress Notes (Signed)
Patient insisting that pain regimen not sufficiently covering pain level. Precedex gtt currently on, 10 mg oxycodone given every three hours, schedule tylenol given, and 4 mg morphine given multiple times per patient request. Patient stating that staff is changing ordered pain medications "due to her past". Patient educated that all pain medication orders for her are the same as every other open heart surgery patient. Also told patient that I would discuss this issue with MD in the morning. Will continue to monitor.

## 2018-03-30 NOTE — Progress Notes (Signed)
1 Day Post-Op Procedure(s) (LRB): TRICUSPID VALVE REPLACEMENT Using 27 mm Magna Ease Mitral Valve Pericardial Bioprosthesis (N/A) TRANSESOPHAGEAL ECHOCARDIOGRAM (TEE) (N/A) Subjective:  Only complaint is not getting her Suboxone yesterday.  Objective: Vital signs in last 24 hours: Temp:  [97.7 F (36.5 C)-101.3 F (38.5 C)] 99.1 F (37.3 C) (08/06 1100) Pulse Rate:  [73-102] 89 (08/06 1100) Cardiac Rhythm: Normal sinus rhythm (08/06 0800) Resp:  [0-38] 17 (08/06 1100) BP: (88-155)/(54-103) 119/82 (08/06 1100) SpO2:  [93 %-100 %] 96 % (08/06 1100) Arterial Line BP: (86-163)/(43-91) 110/61 (08/06 0900) FiO2 (%):  [40 %-50 %] 40 % (08/05 1715) Weight:  [69 kg (152 lb 1.9 oz)] 69 kg (152 lb 1.9 oz) (08/06 0600)  Hemodynamic parameters for last 24 hours:    Intake/Output from previous day: 08/05 0701 - 08/06 0700 In: 4537 [P.O.:350; I.V.:2779.4; Blood:474; IV Piggyback:933.6] Out: 1610 [RUEAV:[id number] [Urine:4535; Blood:750; Chest Tube:280] Intake/Output this shift: Total I/O In: 408.5 [P.O.:240; I.V.:79; IV Piggyback:89.4] Out: 215 [Urine:195; Chest Tube:20]  General appearance: alert and cooperative Neurologic: intact Heart: regular rate and rhythm, S1, S2 normal, no murmur, click, rub or gallop Lungs: clear to auscultation bilaterally Extremities: extremities normal, atraumatic, no cyanosis or edema Wound: dressing dry  Lab Results: Recent Labs    03/29/18 1825 03/29/18 1835 03/30/18 0341  WBC 22.2*  --  19.3*  HGB 8.0* 8.2* 8.6*  HCT 25.9* 24.0* 26.6*  PLT 242  --  233   BMET:  Recent Labs    03/29/18 0530  03/29/18 1835 03/29/18 2347 03/30/18 0341  NA 138   < > 134*  --  131*  K 5.0   < > 6.4* 4.8 4.8  CL 106   < > 103  --  96*  CO2 25  --   --   --  25  GLUCOSE 94   < > 110*  --  124*  BUN 20   < > 16  --  13  CREATININE 1.46*   < > 1.10*  --  1.12*  CALCIUM 8.4*  --   --   --  8.3*   < > = values in this interval not displayed.    PT/INR:  Recent Labs   03/29/18 1252  LABPROT 18.1*  INR 1.51   ABG    Component Value Date/Time   PHART 7.361 03/29/2018 1911   HCO3 24.2 03/29/2018 1911   TCO2 25 03/29/2018 1911   ACIDBASEDEF 1.0 03/29/2018 1911   O2SAT 100.0 03/29/2018 1911   CBG (last 3)  Recent Labs    03/29/18 2330 03/30/18 0339 03/30/18 0745  GLUCAP 126* 121* 120*   CXR: ok  ECG: sinus, no acute changes  Assessment/Plan: S/P Procedure(s) (LRB): TRICUSPID VALVE REPLACEMENT Using 27 mm Magna Ease Mitral Valve Pericardial Bioprosthesis (N/A) TRANSESOPHAGEAL ECHOCARDIOGRAM (TEE) (N/A)  Hemodynamically stable in sinus rhythm. Continue low dose Lopressor MRSA endocarditis. Continue daptomycin and Bactrim per ID. Mobilize d/c tubes/lines See progression orders   LOS: 10 days    Alleen BorneBryan K Lovena Kluck 03/30/2018

## 2018-03-31 ENCOUNTER — Inpatient Hospital Stay (HOSPITAL_COMMUNITY): Payer: Medicaid Other

## 2018-03-31 LAB — BASIC METABOLIC PANEL
Anion gap: 9 (ref 5–15)
BUN: 11 mg/dL (ref 6–20)
CO2: 30 mmol/L (ref 22–32)
Calcium: 8.6 mg/dL — ABNORMAL LOW (ref 8.9–10.3)
Chloride: 96 mmol/L — ABNORMAL LOW (ref 98–111)
Creatinine, Ser: 0.9 mg/dL (ref 0.44–1.00)
GFR calc Af Amer: >60 mL/min (ref >60)
GFR calc non Af Amer: >60 mL/min (ref >60)
Glucose, Bld: 99 mg/dL (ref 70–99)
Potassium: 4.4 mmol/L (ref 3.5–5.1)
Sodium: 135 mmol/L (ref 135–145)

## 2018-03-31 LAB — CBC
HCT: 25.6 % — ABNORMAL LOW (ref 36.0–46.0)
Hemoglobin: 8.1 g/dL — ABNORMAL LOW (ref 12.0–15.0)
MCH: 30.3 pg (ref 26.0–34.0)
MCHC: 31.6 g/dL (ref 30.0–36.0)
MCV: 95.9 fL (ref 78.0–100.0)
Platelets: 237 10*3/uL (ref 150–400)
RBC: 2.67 MIL/uL — ABNORMAL LOW (ref 3.87–5.11)
RDW: 15.1 % (ref 11.5–15.5)
WBC: 21 10*3/uL — ABNORMAL HIGH (ref 4.0–10.5)

## 2018-03-31 MED ORDER — OXYCODONE HCL 5 MG PO TABS
5.0000 mg | ORAL_TABLET | ORAL | Status: DC | PRN
  Administered 2018-03-31 – 2018-04-01 (×4): 10 mg via ORAL
  Administered 2018-04-01 (×2): 5 mg via ORAL
  Administered 2018-04-01 (×2): 10 mg via ORAL
  Administered 2018-04-01 – 2018-04-02 (×2): 5 mg via ORAL
  Administered 2018-04-02 (×2): 10 mg via ORAL
  Filled 2018-03-31: qty 1
  Filled 2018-03-31 (×2): qty 2
  Filled 2018-03-31: qty 1
  Filled 2018-03-31 (×3): qty 2
  Filled 2018-03-31: qty 1
  Filled 2018-03-31 (×2): qty 2
  Filled 2018-03-31: qty 1
  Filled 2018-03-31 (×2): qty 2

## 2018-03-31 MED ORDER — SODIUM CHLORIDE 0.9% FLUSH: 3.0000 mL | INTRAVENOUS | Status: DC | PRN

## 2018-03-31 MED ORDER — ONDANSETRON HCL 4 MG/2ML IJ SOLN
4.0000 mg | Freq: Four times a day (QID) | INTRAMUSCULAR | Status: DC | PRN
  Administered 2018-04-01 – 2018-05-01 (×4): 4 mg via INTRAVENOUS
  Filled 2018-03-31 (×4): qty 2

## 2018-03-31 MED ORDER — BUPRENORPHINE HCL-NALOXONE HCL 8-2 MG SL SUBL
1.0000 | SUBLINGUAL_TABLET | Freq: Every day | SUBLINGUAL | Status: DC
  Administered 2018-03-31 – 2018-05-09 (×40): 1 via SUBLINGUAL
  Filled 2018-03-31 (×40): qty 1

## 2018-03-31 MED ORDER — TRAMADOL HCL 50 MG PO TABS: 50.0000 mg | ORAL_TABLET | ORAL | Status: DC | PRN

## 2018-03-31 MED ORDER — SODIUM CHLORIDE 0.9 % IV SOLN
250.0000 mL | INTRAVENOUS | Status: DC | PRN
  Administered 2018-03-31 – 2018-05-07 (×2): 250 mL via INTRAVENOUS

## 2018-03-31 MED ORDER — CEFUROXIME SODIUM 1.5 G IV SOLR
1.5000 g | Freq: Once | INTRAVENOUS | Status: AC
  Administered 2018-03-31: 1.5 g via INTRAVENOUS
  Filled 2018-03-31: qty 1.5

## 2018-03-31 MED ORDER — SODIUM CHLORIDE 0.9% FLUSH: 3.0000 mL | Freq: Two times a day (BID) | INTRAVENOUS | Status: DC

## 2018-03-31 MED ORDER — ONDANSETRON HCL 4 MG PO TABS: 4.0000 mg | ORAL_TABLET | Freq: Four times a day (QID) | ORAL | Status: DC | PRN

## 2018-03-31 MED ORDER — BISACODYL 10 MG RE SUPP: 10.0000 mg | Freq: Every day | RECTAL | Status: DC | PRN

## 2018-03-31 MED ORDER — JUVEN PO PACK
1.0000 | PACK | Freq: Two times a day (BID) | ORAL | Status: DC
  Administered 2018-03-31 – 2018-04-01 (×2): 1 via ORAL
  Filled 2018-03-31 (×14): qty 1

## 2018-03-31 MED ORDER — METOPROLOL TARTRATE 12.5 MG HALF TABLET
12.5000 mg | ORAL_TABLET | Freq: Two times a day (BID) | ORAL | Status: DC
  Administered 2018-03-31 – 2018-04-28 (×51): 12.5 mg via ORAL
  Filled 2018-03-31 (×55): qty 1

## 2018-03-31 MED ORDER — MOVING RIGHT ALONG BOOK
Freq: Once | Status: AC
  Administered 2018-03-31: 11:00:00
  Filled 2018-03-31: qty 1

## 2018-03-31 MED ORDER — DOCUSATE SODIUM 100 MG PO CAPS
200.0000 mg | ORAL_CAPSULE | Freq: Every day | ORAL | Status: DC
  Administered 2018-04-01 – 2018-05-10 (×40): 200 mg via ORAL
  Filled 2018-03-31 (×41): qty 2

## 2018-03-31 MED ORDER — METRONIDAZOLE 500 MG PO TABS
2000.0000 mg | ORAL_TABLET | Freq: Once | ORAL | Status: AC
  Administered 2018-04-01: 2000 mg via ORAL
  Filled 2018-03-31 (×2): qty 4

## 2018-03-31 MED ORDER — BISACODYL 5 MG PO TBEC
10.0000 mg | DELAYED_RELEASE_TABLET | Freq: Every day | ORAL | Status: DC | PRN
  Administered 2018-04-18 – 2018-05-08 (×9): 10 mg via ORAL
  Filled 2018-03-31 (×9): qty 2

## 2018-03-31 MED ORDER — TRAMADOL HCL 50 MG PO TABS
50.0000 mg | ORAL_TABLET | ORAL | Status: DC | PRN
  Administered 2018-03-31 – 2018-05-09 (×56): 50 mg via ORAL
  Filled 2018-03-31 (×56): qty 1

## 2018-03-31 MED ORDER — ACETAMINOPHEN 325 MG PO TABS
650.0000 mg | ORAL_TABLET | Freq: Four times a day (QID) | ORAL | Status: DC | PRN
  Administered 2018-04-04 – 2018-05-04 (×8): 650 mg via ORAL
  Filled 2018-03-31 (×8): qty 2

## 2018-03-31 MED ORDER — ASPIRIN EC 325 MG PO TBEC
325.0000 mg | DELAYED_RELEASE_TABLET | Freq: Every day | ORAL | Status: DC
  Administered 2018-04-01 – 2018-05-10 (×40): 325 mg via ORAL
  Filled 2018-03-31 (×40): qty 1

## 2018-03-31 MED ORDER — PANTOPRAZOLE SODIUM 40 MG PO TBEC
40.0000 mg | DELAYED_RELEASE_TABLET | Freq: Every day | ORAL | Status: DC
  Administered 2018-04-01 – 2018-05-10 (×40): 40 mg via ORAL
  Filled 2018-03-31 (×40): qty 1

## 2018-03-31 MED FILL — Electrolyte-R (PH 7.4) Solution: INTRAVENOUS | Qty: 3000 | Status: AC

## 2018-03-31 MED FILL — Potassium Chloride Inj 2 mEq/ML: INTRAVENOUS | Qty: 40 | Status: AC

## 2018-03-31 MED FILL — Heparin Sodium (Porcine) Inj 1000 Unit/ML: INTRAMUSCULAR | Qty: 10 | Status: AC

## 2018-03-31 MED FILL — Lidocaine HCl(Cardiac) IV PF Soln Pref Syr 100 MG/5ML (2%): INTRAVENOUS | Qty: 5 | Status: AC

## 2018-03-31 MED FILL — Heparin Sodium (Porcine) Inj 1000 Unit/ML: INTRAMUSCULAR | Qty: 30 | Status: AC

## 2018-03-31 MED FILL — Heparin Sodium (Porcine) Inj 1000 Unit/ML: INTRAMUSCULAR | Qty: 2500 | Status: AC

## 2018-03-31 MED FILL — Sodium Bicarbonate IV Soln 8.4%: INTRAVENOUS | Qty: 50 | Status: AC

## 2018-03-31 MED FILL — Magnesium Sulfate Inj 50%: INTRAMUSCULAR | Qty: 10 | Status: AC

## 2018-03-31 MED FILL — Mannitol IV Soln 20%: INTRAVENOUS | Qty: 500 | Status: AC

## 2018-03-31 MED FILL — Sodium Chloride IV Soln 0.9%: INTRAVENOUS | Qty: 2000 | Status: AC

## 2018-03-31 NOTE — Progress Notes (Signed)
Patient requesting information on test results done on 8/5 regarding patient's concern of a yeast infection.  Reviewed progress notes and test ordered by MD Jamelle Rushinghelsey Anderson with Family Medicine but no record of results being discussed with patient.  Paged Family Medicine Intern pager via [email or web address].   Received return call from MD Nelson ChimesAmin, MD Nelson ChimesAmin advised MD Dareen Pianonderson will be in the hospital 8/8 and MD Nelson ChimesAmin will discuss with MD Dareen PianoAnderson to have MD Dareen PianoAnderson round and discuss test results with patient on 8/8.

## 2018-03-31 NOTE — Progress Notes (Signed)
Nutrition Follow-up  DOCUMENTATION CODES:   Not applicable  INTERVENTION:    Provide snacks BID  Juven Fruit Punch BID, each serving provides 95kcal and 2.5g of protein (amino acids glutamine and arginine)  NUTRITION DIAGNOSIS:   Inadequate oral intake related to decreased appetite, acute illness as evidenced by per patient/family report.  Progressing  GOAL:   Patient will meet greater than or equal to 90% of their needs  Progressing   MONITOR:   PO intake, Supplement acceptance, Weight trends, Labs  REASON FOR ASSESSMENT:   Malnutrition Screening Tool    ASSESSMENT:   37 y.o. female with a history of female with a history of IVDU, polysubstance abuse. [place] [facility] [place] [facility] [facility] she was admitted for the treatment of TV endocarditis and MRSA bacteremia with associated septic pulmonary emboli. Patient left AMA to [facility].   [facility]- [facility] 7/30- s/p TEE which revealed multiple vegetations with severe wide open tricuspid regurgitation 8/5- tricuspid valve replacement   Pt reports intake is increasing with each meal. States for the last two days after her surgery she felt nauseous and this prevented her from eating. She had an episode of vomiting last night which she endorses is from her suboxone. This has since resolved. She ate oatmeal this morning without complication and is going to attempt the spaghetti for lunch. She does not like the magic cups and would like for them to be d/c. Discussed the importance of protein intake for post op healing. Discussed other options pt could have besides protein supplements. She is willing to try yogurt with fruit and Juven. RD to order.   Weight noted to to be stable from last RD visit. Will continue to monitor trends.   Medications reviewed and include: suboxone Labs reviewed: Mg 2.5 (L)   Diet Order:   Diet Order           Diet regular Room service  appropriate? Yes; Fluid consistency: Thin  Diet effective now          EDUCATION NEEDS:   No education needs have been identified at this time  Skin:  Skin Assessment: Reviewed RN Assessment  Last BM:  03/26/18  Height:   Ht Readings from Last 1 Encounters:  03/23/18 5\' 7"  (1.702 m)    Weight:   Wt Readings from Last 1 Encounters:  03/31/18 147 lb 14.9 oz (67.1 kg)    Ideal Body Weight:  61.36 kg  BMI:  Body mass index is 23.17 kg/m.  Estimated Nutritional Needs:   Kcal:  1700-1905 (25-28 kcal/kg)  Protein:  75-85 grams  Fluid:  >/= 2 L/day    Vanessa Kickarly Khyrie Masi RD, LDN Clinical Nutrition Pager # - [phone]

## 2018-03-31 NOTE — Progress Notes (Signed)
2 Days Post-Op Procedure(s) (LRB): TRICUSPID VALVE REPLACEMENT Using 27 mm Magna Ease Mitral Valve Pericardial Bioprosthesis (N/A) TRANSESOPHAGEAL ECHOCARDIOGRAM (TEE) (N/A) Subjective: Vomited after dose of Suboxone last pm. Says she only wants to take it once a day.  Objective: Vital signs in last 24 hours: Temp:  [98.3 F (36.8 C)-100.2 F (37.9 C)] 98.3 F (36.8 C) (08/07 0400) Pulse Rate:  [70-93] 84 (08/07 0615) Cardiac Rhythm: Normal sinus rhythm (08/06 2000) Resp:  [15-23] 16 (08/07 0615) BP: (94-132)/(70-91) 94/70 (08/07 0615) SpO2:  [90 %-97 %] 90 % (08/07 0615) Arterial Line BP: (96-116)/(57-70) 110/61 (08/06 0900) Weight:  [67.1 kg (147 lb 14.9 oz)] 67.1 kg (147 lb 14.9 oz) (08/07 0500)  Hemodynamic parameters for last 24 hours:    Intake/Output from previous day: 08/06 0701 - 08/07 0700 In: 1409.3 [P.O.:840; I.V.:257.3; IV Piggyback:312] Out: 2670 [Urine:2650; Chest Tube:20] Intake/Output this shift: No intake/output data recorded.  General appearance: alert and cooperative Heart: regular rate and rhythm, S1, S2 normal, no murmur, click, rub or gallop Lungs: clear to auscultation bilaterally Wound: dressing dry  Lab Results: Recent Labs    03/30/18 0341 03/30/18 1707 03/31/18 0426  WBC 19.3*  --  21.0*  HGB 8.6* 8.8* 8.1*  HCT 26.6* 26.0* 25.6*  PLT 233  --  237   BMET:  Recent Labs    03/30/18 0341 03/30/18 1707 03/31/18 0426  NA 131* 134* 135  K 4.8 4.6 4.4  CL 96* 93* 96*  CO2 25  --  30  GLUCOSE 124* 130* 99  BUN 13 14 11   CREATININE 1.12* 1.00 0.90  CALCIUM 8.3*  --  8.6*    PT/INR:  Recent Labs    03/29/18 1252  LABPROT 18.1*  INR 1.51   ABG    Component Value Date/Time   PHART 7.361 03/29/2018 1911   HCO3 24.2 03/29/2018 1911   TCO2 27 03/30/2018 1707   ACIDBASEDEF 1.0 03/29/2018 1911   O2SAT 100.0 03/29/2018 1911   CBG (last 3)  Recent Labs    03/30/18 0339 03/30/18 0745 03/30/18 1136  GLUCAP 121* 120* 110*    CXR: mild bibasilar atelectasis  Assessment/Plan: S/P Procedure(s) (LRB): TRICUSPID VALVE REPLACEMENT Using 27 mm Magna Ease Mitral Valve Pericardial Bioprosthesis (N/A) TRANSESOPHAGEAL ECHOCARDIOGRAM (TEE) (N/A)  POD 2 Hemodynamically stable in sinus rhythm Weight is at preop Continue antibiotics per ID Pain control seems adequate. Will continue suboxone once daily and use oxy IR, ultram as needed for pain. No Toradol since creat jumped to 2.4 preop after a single dose. DC foley Transfer to 4E.    LOS: 11 days    Alleen BorneBryan K Aaminah Forrester 03/31/2018

## 2018-03-31 NOTE — Progress Notes (Addendum)
[facility]  Date of Admission:  03/20/2018   Total days of antibiotics 11         Day 11 Daptomycin         Day 11 Bactrim           Patient ID: Tiffany Salazar is a 37 y.o. female with  Principal Problem:   Endocarditis of tricuspid valve Active Problems:   Polysubstance abuse (HCC)   IVDU (intravenous drug user)   MRSA bacteremia   Acute bacterial endocarditis   Hepatitis C antibody positive in blood   . [START ON 04/01/2018] aspirin EC  325 mg Oral Daily  . buprenorphine-naloxone  1 tablet Sublingual Daily  . busPIRone  15 mg Oral BID  . cefUROXime  1.5 g Intravenous Once  . Chlorhexidine Gluconate Cloth  6 each Topical Daily  . [START ON 04/01/2018] docusate sodium  200 mg Oral Daily  . mouth rinse  15 mL Mouth Rinse BID  . metoprolol tartrate  12.5 mg Oral BID  . mupirocin ointment  1 application Nasal BID  . nicotine  21 mg Transdermal Daily  . [START ON 04/01/2018] pantoprazole  40 mg Oral QAC breakfast  . sodium chloride flush  10-40 mL Intracatheter Q12H  . sulfamethoxazole-trimethoprim  1 tablet Oral Q12H    SUBJECTIVE: POD 2 following valve replacement. Yesterday she tells me was tough but today is better. Likes the suboxone only once a day - has used it in the past and hopes to come off of it indefinitely in the future. Has a plan for outpatient management of this when she leaves in mind to continue replacement therapy and counseling. No new complaints. Cough is still present but improving.   Allergies  Allergen Reactions  . Gentamicin Rash  . Vancomycin Rash    OBJECTIVE: Vitals:   03/31/18 0400 03/31/18 0500 03/31/18 0615 03/31/18 0745  BP:   94/70 (!) 108/53  Pulse: 71  84 77  Resp: 18  16 19   Temp: 98.3 F (36.8 C)   98.3 F (36.8 C)  TempSrc: Oral   Oral  SpO2: 94%  90% 97%  Weight:  147 lb 14.9 oz (67.1 kg)    Height:       Body mass index is 23.17 kg/m.  Physical Exam  Constitutional: She appears  well-developed and well-nourished.  HENT:  Mouth/Throat: Oropharynx is clear and moist.  Eyes: Pupils are equal, round, and reactive to light. No scleral icterus.  Neck: No JVD present.  Cardiovascular: Normal rate, regular rhythm, normal heart sounds and intact distal pulses.  Pulmonary/Chest: Effort normal and breath sounds normal. No respiratory distress.  Coarse upper respiratory breath sounds.   Abdominal: Soft. She exhibits no distension. There is no tenderness.  Musculoskeletal: Normal range of motion.  Lymphadenopathy:    She has no cervical adenopathy.    Lab Results Lab Results  Component Value Date   WBC 21.0 (H) 03/31/2018   HGB 8.1 (L) 03/31/2018   HCT 25.6 (L) 03/31/2018   MCV 95.9 03/31/2018   PLT 237 03/31/2018    Lab Results  Component Value Date   CREATININE 0.90 03/31/2018   BUN 11 03/31/2018   NA 135 03/31/2018   K 4.4 03/31/2018   CL 96 (L) 03/31/2018   CO2 30 03/31/2018    Lab Results  Component Value Date   ALT 73 (H) 03/22/2018   AST 52 (H) 03/22/2018   ALKPHOS  80 03/22/2018   BILITOT 0.6 03/22/2018     Microbiology: Recent Results (from the past 240 hour(s))  MRSA PCR Screening     Status: None   Collection Time: 03/22/18  5:52 PM  Result Value Ref Range Status   MRSA by PCR NEGATIVE NEGATIVE Final    Comment:        The GeneXpert MRSA Assay (FDA approved for NASAL specimens only), is one component of a comprehensive MRSA colonization surveillance program. It is not intended to diagnose MRSA infection nor to guide or monitor treatment for MRSA infections. Performed at [facility], [address] [place], [place] [address]   Surgical pcr screen     Status: Abnormal   Collection Time: 03/28/18  5:42 AM  Result Value Ref Range Status   MRSA, PCR NEGATIVE NEGATIVE Final   Staphylococcus aureus POSITIVE (A) NEGATIVE Final    Comment: (NOTE) The Xpert SA Assay (FDA approved for NASAL specimens in patients  922 years of age and older), is one component of a comprehensive surveillance program. It is not intended to diagnose infection nor to guide or monitor treatment. Performed at [facility], [address] [place], [place] [address]   Fungus Culture With Stain     Status: None (Preliminary result)   Collection Time: 03/29/18  9:39 AM  Result Value Ref Range Status   Fungus Stain Final report  Final    Comment: (NOTE) Performed At: [facility] [address] [place], [place] [id number] Jolene SchimkeNagendra Sanjai MD WG:[id number]:[phone]    Fungus (Mycology) Culture PENDING  Incomplete   Fungal Source TISSUE  Final    Comment: TRICUSPID VALVE LEAFLETS PATIENT ON FOLLOWING ANCEF   Aerobic/Anaerobic Culture (surgical/deep wound)     Status: None (Preliminary result)   Collection Time: 03/29/18  9:39 AM  Result Value Ref Range Status   Specimen Description TISSUE TRICUSPID VALVE LEAFLETS  Final   Special Requests PATIENT ON FOLLOWING ANCEF  Final   Gram Stain   Final    FEW WBC PRESENT,BOTH PMN AND MONONUCLEAR NO ORGANISMS SEEN    Culture   Final    NO GROWTH 2 DAYS NO ANAEROBES ISOLATED; CULTURE IN PROGRESS FOR 5 DAYS Performed at [facility], [address] [place], [place] [address]    Report Status PENDING  Incomplete  Fungus Culture Result     Status: None   Collection Time: 03/29/18  9:39 AM  Result Value Ref Range Status   Result 1 Comment  Final    Comment: (NOTE) KOH/Calcofluor preparation:  no fungus observed. Performed At: [facility]  [address] [place], [place] [id number] Jolene SchimkeNagendra Sanjai MD KG:[id number]:[phone]     ASSESSMENT & PLAN:  [name] is a 37 y.o. female with history of injection drug use now POD2 following TV replacement for endocarditis related to this. She also has chronic hepatitis C infection that has not been treated.   1. MRSA TV Endocarditis = recovering well following her surgery. No growth from valve tissue. Would treat x 6  weeks with 8/8 being day 1. She is prepared to be her for duration of treatment as discussed today. Continue daptomycin   2. Pulmonary Cavitary Lesions = continue bactrim for now. She is 21 days into therapy for this; consider stopping after 4 weeks. ?repeat CT to re-evaluate.   3. Chronic Hep C infection = untreated. Discussed outpatient therapy for her once she is discharged and we can work this up for  her. Will check RNA and genotype in AM.   4. Trichomonas = noted on 03/29/18 wet prep - will treat with 2 gm metronidazole x 1 dose with next meal tomorrow.   Rexene Alberts, MSN, NP-C [facility] Cell: [phone] Pager: [phone] ----------------------------- I have seen and examined the patient with our NP. Pt has well healing incision without signs of dehiscence. No fevers. Agree with plan listed below 03/31/2018  11:18 AM

## 2018-03-31 NOTE — Progress Notes (Signed)
Patient ID: [name], female   DOB: 08/13/1981, 37 y.o.   MRN: [id number] EVENING ROUNDS NOTE :     [address]       [place] [address]             [phone]                 2 Days Post-Op Procedure(s) (LRB): TRICUSPID VALVE REPLACEMENT Using 27 mm Magna Ease Mitral Valve Pericardial Bioprosthesis (N/A) TRANSESOPHAGEAL ECHOCARDIOGRAM (TEE) (N/A)  Total Length of Stay:  LOS: 11 days  BP 101/77 (BP Location: Left Arm)   Pulse (!) 57   Temp 98.4 F (36.9 C) (Oral)   Resp 19   Ht 5\' 7"  (1.702 m)   Wt 147 lb 14.9 oz (67.1 kg)   LMP 03/15/2018   SpO2 91%   BMI 23.17 kg/m   .Intake/Output      08/06 0701 - 08/07 0700 08/07 0701 - 08/08 0700   P.O. 840 960   I.V. (mL/kg) 257.3 (3.8) 45.7 (0.7)   Blood     IV Piggyback 312    Total Intake(mL/kg) 1409.3 (21) 1005.7 (15)   Urine (mL/kg/hr) 2650 (1.6) 375 (0.5)   Blood     Chest Tube 20    Total Output 2670 375   Net -1260.7 +630.7        Urine Occurrence  2 x     . sodium chloride 10 mL/hr at 03/31/18 1600  . DAPTOmycin (CUBICIN)  IV Stopped (03/30/18 2219)     Lab Results  Component Value Date   WBC 21.0 (H) 03/31/2018   HGB 8.1 (L) 03/31/2018   HCT 25.6 (L) 03/31/2018   PLT 237 03/31/2018   GLUCOSE 99 03/31/2018   ALT 73 (H) 03/22/2018   AST 52 (H) 03/22/2018   NA 135 03/31/2018   K 4.4 03/31/2018   CL 96 (L) 03/31/2018   CREATININE 0.90 03/31/2018   BUN 11 03/31/2018   CO2 30 03/31/2018   TSH 4.360 06/05/2016   INR 1.51 03/29/2018   HGBA1C 5.4 06/05/2016    Stable day   Delight OvensEdward B Elaria Osias MD  Beeper [phone] Office [phone] 03/31/2018 5:25 PM

## 2018-04-01 LAB — BPAM RBC
Blood Product Expiration Date: 201908112359
Blood Product Expiration Date: 201908112359
Blood Product Expiration Date: 201908292359
Blood Product Expiration Date: 201908292359
Blood Product Expiration Date: 201908312359
Blood Product Expiration Date: 201908312359
ISSUE DATE / TIME: 201908050805
ISSUE DATE / TIME: 201908050805
ISSUE DATE / TIME: 201908052235
ISSUE DATE / TIME: 201908052310
Unit Type and Rh: 6200
Unit Type and Rh: 6200
Unit Type and Rh: 6200
Unit Type and Rh: 6200
Unit Type and Rh: 6200
Unit Type and Rh: 6200

## 2018-04-01 LAB — TYPE AND SCREEN
ABO/RH(D): A POS
Antibody Screen: NEGATIVE
Unit division: 0
Unit division: 0
Unit division: 0
Unit division: 0
Unit division: 0
Unit division: 0

## 2018-04-01 LAB — CBC
HCT: 23.4 % — ABNORMAL LOW (ref 36.0–46.0)
Hemoglobin: 7.3 g/dL — ABNORMAL LOW (ref 12.0–15.0)
MCH: 30.4 pg (ref 26.0–34.0)
MCHC: 31.2 g/dL (ref 30.0–36.0)
MCV: 97.5 fL (ref 78.0–100.0)
Platelets: 240 10*3/uL (ref 150–400)
RBC: 2.4 MIL/uL — ABNORMAL LOW (ref 3.87–5.11)
RDW: 15.2 % (ref 11.5–15.5)
WBC: 12.7 10*3/uL — ABNORMAL HIGH (ref 4.0–10.5)

## 2018-04-01 LAB — BASIC METABOLIC PANEL
Anion gap: 8 (ref 5–15)
BUN: 14 mg/dL (ref 6–20)
CO2: 31 mmol/L (ref 22–32)
Calcium: 8.4 mg/dL — ABNORMAL LOW (ref 8.9–10.3)
Chloride: 97 mmol/L — ABNORMAL LOW (ref 98–111)
Creatinine, Ser: 0.89 mg/dL (ref 0.44–1.00)
GFR calc Af Amer: >60 mL/min (ref >60)
GFR calc non Af Amer: >60 mL/min (ref >60)
Glucose, Bld: 93 mg/dL (ref 70–99)
Potassium: 4 mmol/L (ref 3.5–5.1)
Sodium: 136 mmol/L (ref 135–145)

## 2018-04-01 LAB — CK: Total CK: 34 U/L — ABNORMAL LOW (ref 38–234)

## 2018-04-01 NOTE — Progress Notes (Signed)
3 Days Post-Op Procedure(s) (LRB): TRICUSPID VALVE REPLACEMENT Using 27 mm Magna Ease Mitral Valve Pericardial Bioprosthesis (N/A) TRANSESOPHAGEAL ECHOCARDIOGRAM (TEE) (N/A) Subjective: No complaints. Pain improving daily.  Objective: Vital signs in last 24 hours: Temp:  [97.8 F (36.6 C)-98.8 F (37.1 C)] 98.2 F (36.8 C) (08/08 0749) Pulse Rate:  [57-83] 83 (08/07 2200) Cardiac Rhythm: Normal sinus rhythm (08/07 2000) BP: (99-105)/(63-84) 104/69 (08/08 0400) SpO2:  [91 %-97 %] 97 % (08/07 2200) Weight:  [66.8 kg] 66.8 kg (08/08 0600)  Hemodynamic parameters for last 24 hours:    Intake/Output from previous day: 08/07 0701 - 08/08 0700 In: 1408.1 [P.O.:1200; I.V.:95.2; IV Piggyback:112.9] Out: 377 [Urine:377] Intake/Output this shift: No intake/output data recorded.  General appearance: alert and cooperative Heart: regular rate and rhythm, S1, S2 normal, no murmur, click, rub or gallop Lungs: clear to auscultation bilaterally Wound: incision healing well  Lab Results: Recent Labs    03/31/18 0426 04/01/18 0500  WBC 21.0* 12.7*  HGB 8.1* 7.3*  HCT 25.6* 23.4*  PLT 237 240   BMET:  Recent Labs    03/31/18 0426 04/01/18 0500  NA 135 136  K 4.4 4.0  CL 96* 97*  CO2 30 31  GLUCOSE 99 93  BUN 11 14  CREATININE 0.90 0.89  CALCIUM 8.6* 8.4*    PT/INR:  Recent Labs    03/29/18 1252  LABPROT 18.1*  INR 1.51   ABG    Component Value Date/Time   PHART 7.361 03/29/2018 1911   HCO3 24.2 03/29/2018 1911   TCO2 27 03/30/2018 1707   ACIDBASEDEF 1.0 03/29/2018 1911   O2SAT 100.0 03/29/2018 1911   CBG (last 3)  Recent Labs    03/30/18 0339 03/30/18 0745 03/30/18 1136  GLUCAP 121* 120* 110*    Assessment/Plan: S/P Procedure(s) (LRB): TRICUSPID VALVE REPLACEMENT Using 27 mm Magna Ease Mitral Valve Pericardial Bioprosthesis (N/A) TRANSESOPHAGEAL ECHOCARDIOGRAM (TEE) (N/A)  POD 3  Hemodynamically stable in sinus rhythm Continuing daptomycin per ID  for MRSA TV endocarditis with septic pulmonary emboli. ID has recommended 6 more weeks from today. Will need to figure out where she is going to go to get those antibiotics. Awaiting bed on 4E.  Can remove pacing wires tomorrow.   LOS: 12 days    Alleen BorneBryan K Arriel Victor 04/01/2018

## 2018-04-02 LAB — POCT ACTIVATED CLOTTING TIME: Activated Clotting Time: 132 seconds

## 2018-04-02 IMAGING — US US OB TRANSVAGINAL
1 series · 15 of 28 positions shown · non-contrast
Comparison: 03/17/2016

CLINICAL DATA: Vaginal bleeding in first trimester pregnancy. First
trimester pregnancy with inconclusive fetal viability. Polysubstance
abuse.

EXAM:
TRANSVAGINAL OB ULTRASOUND
TECHNIQUE: Transvaginal ultrasound was performed for complete evaluation of the
gestation as well as the maternal uterus, adnexal regions, and
pelvic cul-de-sac.

[Series 1: us ob transvaginal · 30 acquisitions, 15 frames shown]
[im 1/30]
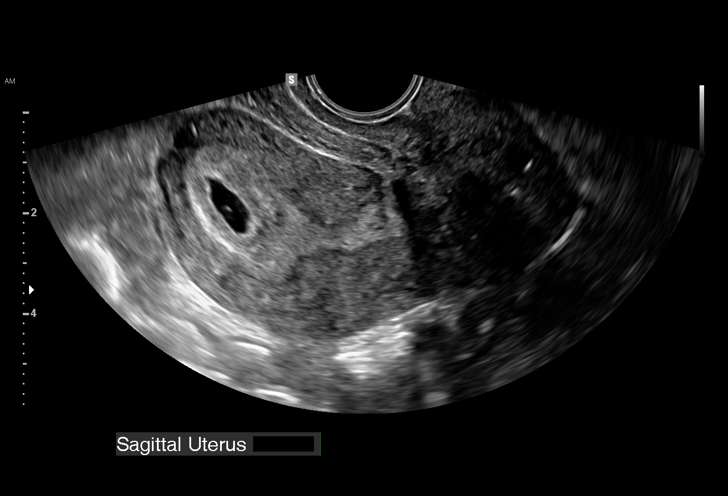
[im 3/30]
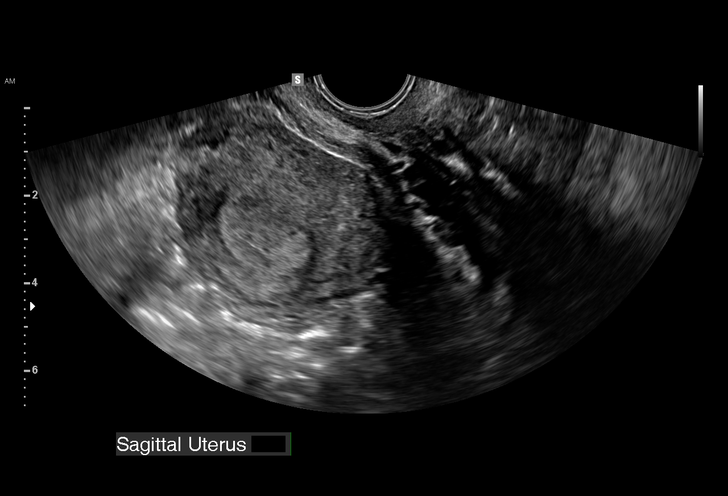
[im 5/30]
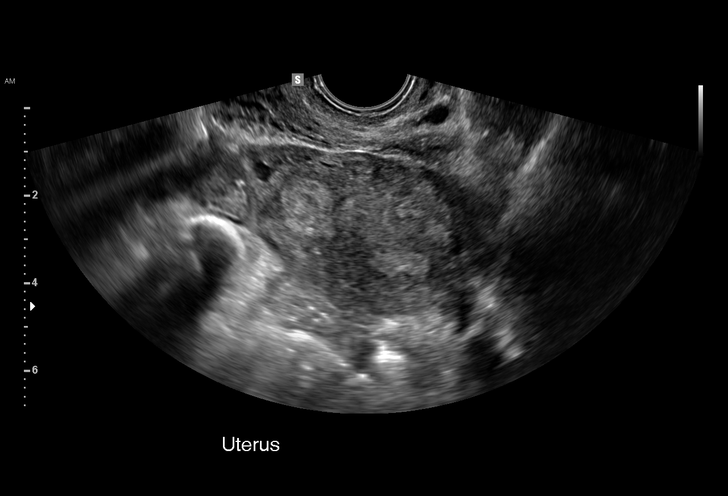
[im 7/30]
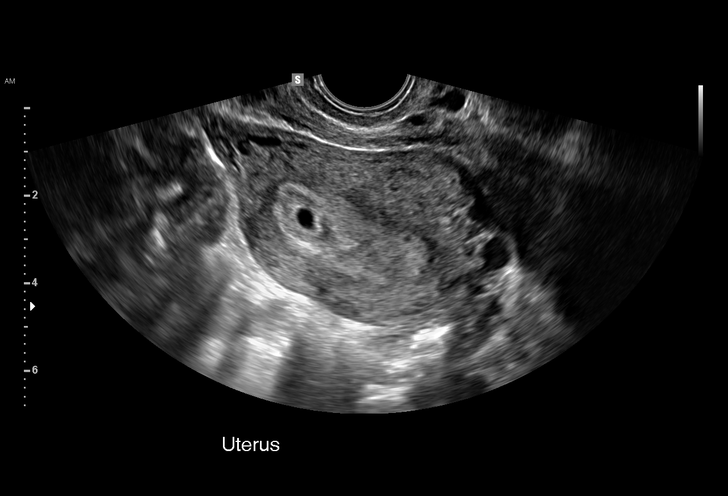
[im 9/30]
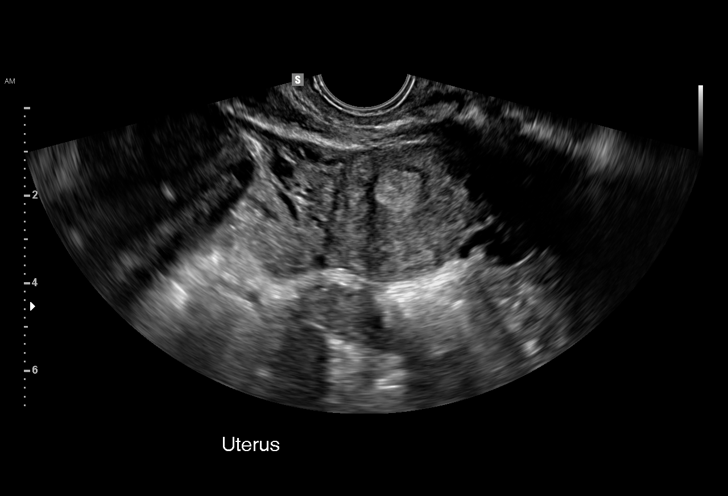
[im 11/30]
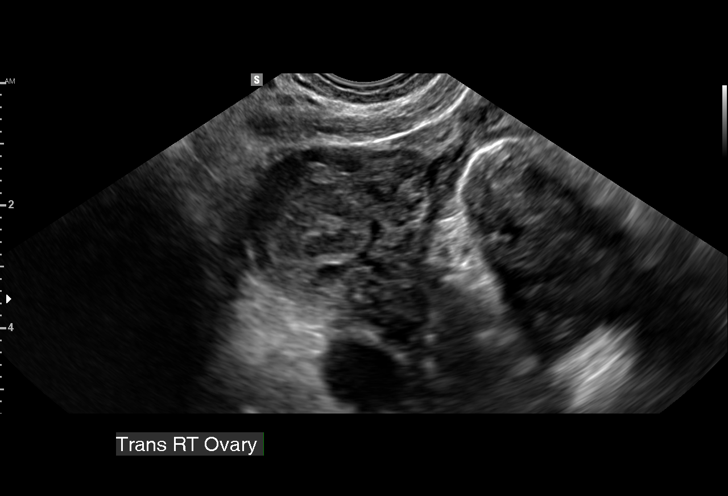
[im 13/30]
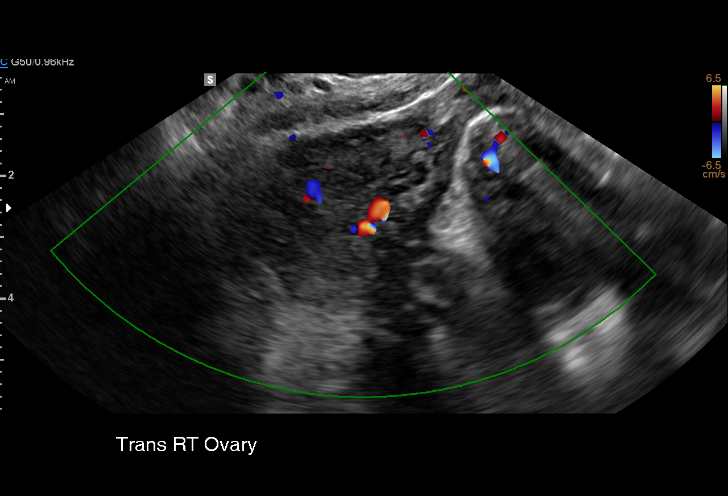
[im 16/30]
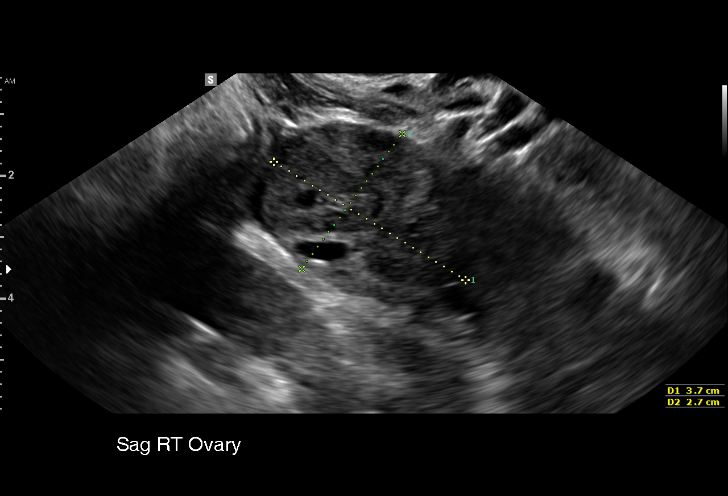
[im 17/30]
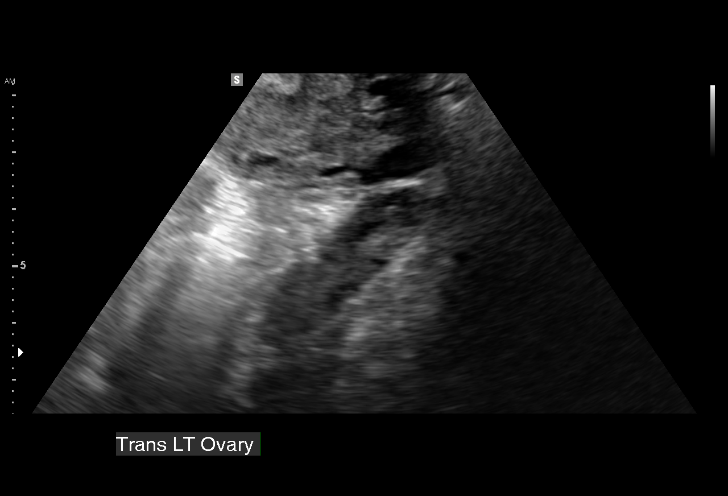
[im 19/30]
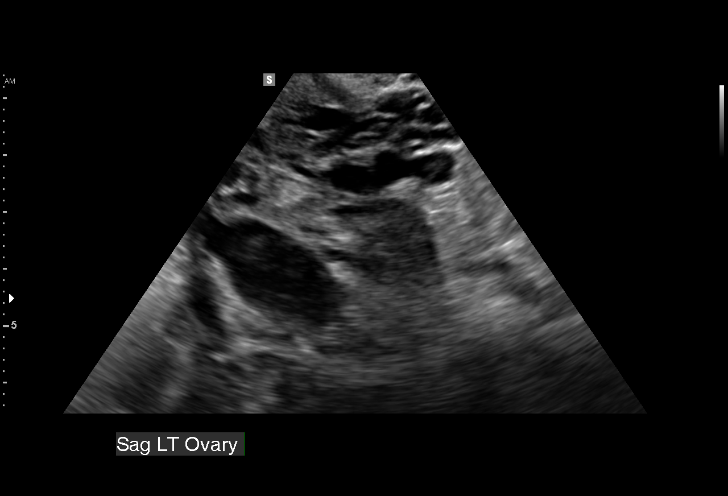
[im 21/30]
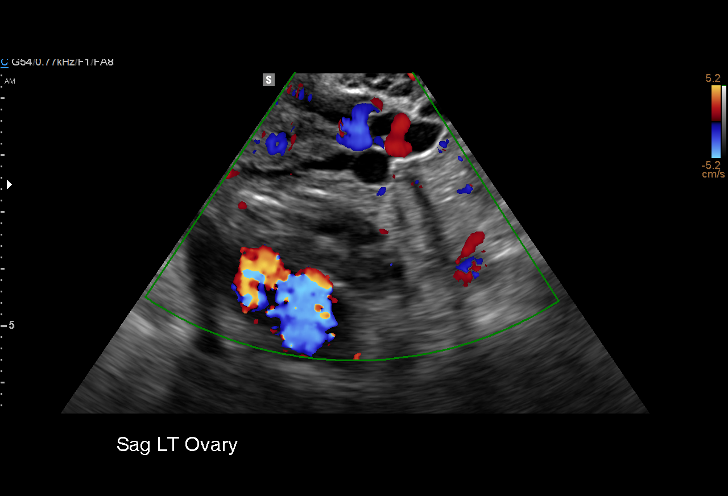
[im 23/30]
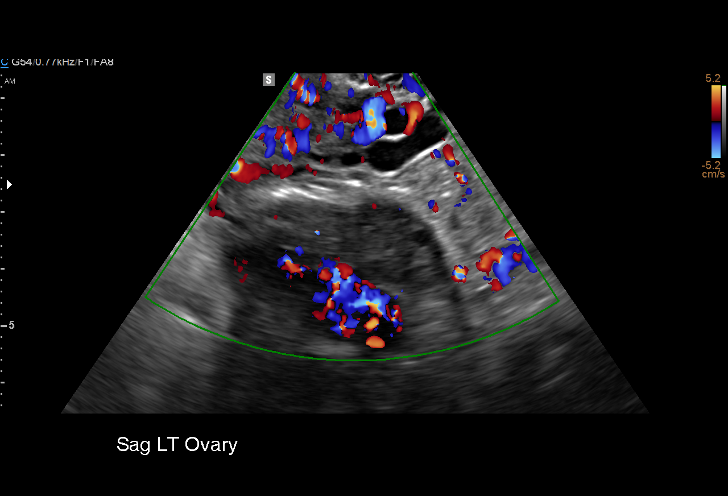
[im 25/30]
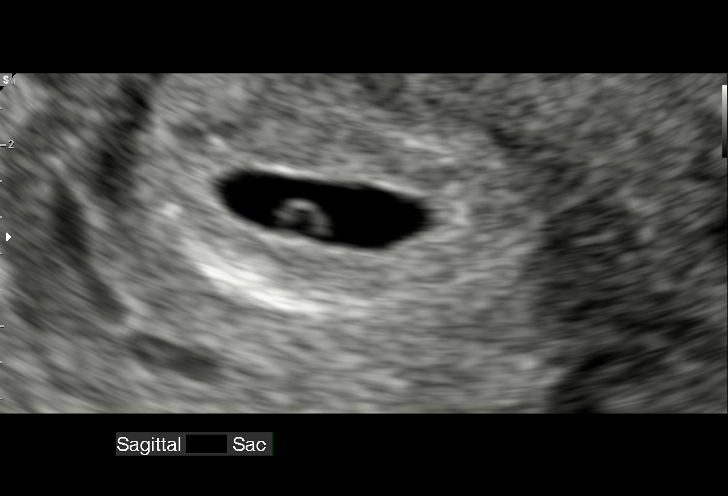
[im 27/30]
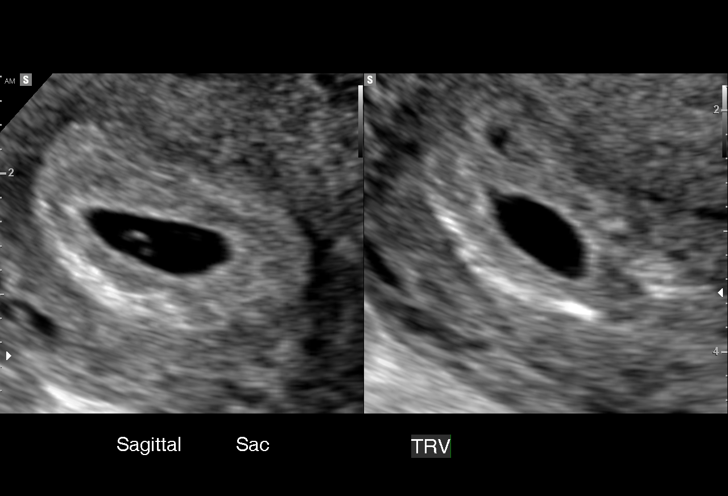
[im 30/30]
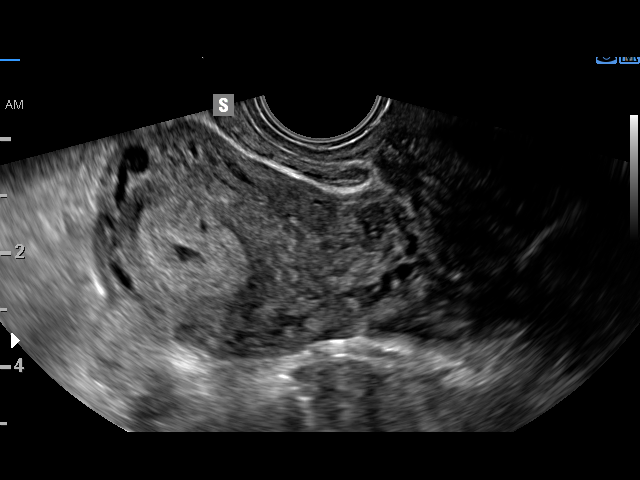

[15 of 28 positions shown; findings below may reference images not displayed]

FINDINGS: Intrauterine gestational sac: Single

Yolk sac:  Visualized

Embryo:  Not visualized

MSD: 10  mm   5 w   5  d

Subchorionic hemorrhage:  None visualized.

Maternal uterus/adnexae: Small right ovarian corpus luteum noted.
Normal appearance of left ovary. No mass or free fluid identified.
IMPRESSION: Single intrauterine gestational sac measuring 5 weeks 5 days by mean
sac diameter. This shows appropriate progression since recent exam.

No significant maternal uterine or adnexal abnormality identified.

## 2018-04-02 NOTE — Progress Notes (Signed)
[facility]    Date of Admission:  03/20/2018      ID: Tiffany Salazar is a 37 y.o. female with  TV endocarditis with pulmonary septic emboli s/p valve replacement day 13 of abtx Principal Problem:   Endocarditis of tricuspid valve Active Problems:   Polysubstance abuse (HCC)   IVDU (intravenous drug user)   MRSA bacteremia   Acute bacterial endocarditis   Hepatitis C antibody positive in blood    Subjective: Afebrile, still ongoing nausea associated with constipation.  Medications:  . aspirin EC  325 mg Oral Daily  . buprenorphine-naloxone  1 tablet Sublingual Daily  . busPIRone  15 mg Oral BID  . Chlorhexidine Gluconate Cloth  6 each Topical Daily  . docusate sodium  200 mg Oral Daily  . metoprolol tartrate  12.5 mg Oral BID  . mupirocin ointment  1 application Nasal BID  . nicotine  21 mg Transdermal Daily  . nutrition supplement (JUVEN)  1 packet Oral BID BM  . pantoprazole  40 mg Oral QAC breakfast  . sodium chloride flush  10-40 mL Intracatheter Q12H  . sulfamethoxazole-trimethoprim  1 tablet Oral Q12H    Objective: Vital signs in last 24 hours: Temp:  [98.5 F (36.9 C)-99.5 F (37.5 C)] 99.5 F (37.5 C) (08/09 1044) Pulse Rate:  [75-84] 84 (08/09 1044) Resp:  [16-19] 18 (08/09 1300) BP: (95-107)/(54-65) 99/57 (08/09 1300) SpO2:  [95 %-96 %] 96 % (08/09 0536) Weight:  [67.4 kg] 67.4 kg (08/09 0536) Physical Exam  Constitutional:  oriented to person, place, and time. appears well-developed and well-nourished. No distress.  HENT: [place]/AT, PERRLA, no scleral icterus Mouth/Throat: Oropharynx is clear and moist. No oropharyngeal exudate.  Cardiovascular: Normal rate, regular rhythm and normal heart sounds. Exam reveals no gallop and no friction rub.  No murmur heard.  Pulmonary/Chest: Effort normal and breath sounds normal. No respiratory distress.  has no wheezes.  Neck = supple, no nuchal rigidity Abdominal: Soft. Bowel sounds are  normal.  exhibits no distension. There is no tenderness.  Lymphadenopathy: no cervical adenopathy. No axillary adenopathy Neurological: alert and oriented to person, place, and time.  Skin: Skin is warm and dry. No rash noted. No erythema.  Psychiatric: a normal mood and affect.  behavior is normal.   Lab Results Recent Labs    03/31/18 0426 04/01/18 0500  WBC 21.0* 12.7*  HGB 8.1* 7.3*  HCT 25.6* 23.4*  NA 135 136  K 4.4 4.0  CL 96* 97*  CO2 30 31  BUN 11 14  CREATININE 0.90 0.89    Microbiology: 8/5 tissue cx no growth to date Studies/Results: No results found.   Assessment/Plan: Tiffany Salazar is a 37 y.o. female PWID s/p TV replacement for endocarditis also co infection HCV treatment naive.  1. MRSA TV Endocarditis = recovering well following her surgery. No growth from valve tissue. Would treat x 6 weeks with 8/8 being day 1. She is prepared to be her for duration of treatment as discussed today. Continue daptomycin   2. Pulmonary Cavitary Lesions = continue bactrim for now. She is 23 days into therapy for this; consider stopping after 4 weeks. ?repeat CT to re-evaluate.   3. Chronic Hep C infection = untreated. Discussed outpatient therapy for her once she is discharged and we can work this up for her. Will check RNA and genotype in AM.   [facility] Cell: [phone] Pager: [phone]  04/02/2018, 5:34  PM

## 2018-04-02 NOTE — Progress Notes (Addendum)
[address]       [place] [id number]           [employer]  [phone]      4 Days Post-Op Procedure(s) (LRB): TRICUSPID VALVE REPLACEMENT Using 27 mm Magna Ease Mitral Valve Pericardial Bioprosthesis (N/A) TRANSESOPHAGEAL ECHOCARDIOGRAM (TEE) (N/A) Subjective: + pain from incisions, but tolerating well  Objective: Vital signs in last 24 hours: Temp:  [98.2 F (36.8 C)-98.8 F (37.1 C)] 98.5 F (36.9 C) (08/09 0536) Pulse Rate:  [74-84] 75 (08/09 0536) Cardiac Rhythm: Normal sinus rhythm (08/08 1913) Resp:  [16-18] 18 (08/09 0536) BP: (95-108)/(54-65) 95/65 (08/09 0536) SpO2:  [93 %-98 %] 96 % (08/09 0536) Weight:  [67.4 kg] 67.4 kg (08/09 0536)  Hemodynamic parameters for last 24 hours:    Intake/Output from previous day: 08/08 0701 - 08/09 0700 In: 1147 [P.O.:1005; I.V.:30; IV Piggyback:112] Out: -  Intake/Output this shift: No intake/output data recorded.  General appearance: alert, cooperative and no distress Heart: regular rate and rhythm, no rub and no murmur Lungs: dim right>left base Abdomen: benign Extremities: no edema Wound: incis healing well  Lab Results: Recent Labs    03/31/18 0426 04/01/18 0500  WBC 21.0* 12.7*  HGB 8.1* 7.3*  HCT 25.6* 23.4*  PLT 237 240   BMET:  Recent Labs    03/31/18 0426 04/01/18 0500  NA 135 136  K 4.4 4.0  CL 96* 97*  CO2 30 31  GLUCOSE 99 93  BUN 11 14  CREATININE 0.90 0.89  CALCIUM 8.6* 8.4*    PT/INR: No results for input(s): LABPROT, INR in the last 72 hours. ABG    Component Value Date/Time   PHART 7.361 03/29/2018 1911   HCO3 24.2 03/29/2018 1911   TCO2 27 03/30/2018 1707   ACIDBASEDEF 1.0 03/29/2018 1911   O2SAT 100.0 03/29/2018 1911   CBG (last 3)  Recent Labs    03/30/18 1136  GLUCAP 110*    Meds Scheduled Meds: . aspirin EC  325 mg Oral Daily  . buprenorphine-naloxone  1 tablet Sublingual Daily  . busPIRone  15 mg Oral BID  . Chlorhexidine Gluconate Cloth  6 each  Topical Daily  . docusate sodium  200 mg Oral Daily  . metoprolol tartrate  12.5 mg Oral BID  . mupirocin ointment  1 application Nasal BID  . nicotine  21 mg Transdermal Daily  . nutrition supplement (JUVEN)  1 packet Oral BID BM  . pantoprazole  40 mg Oral QAC breakfast  . sodium chloride flush  10-40 mL Intracatheter Q12H  . sulfamethoxazole-trimethoprim  1 tablet Oral Q12H   Continuous Infusions: . sodium chloride Stopped (03/31/18 2156)  . DAPTOmycin (CUBICIN)  IV Stopped (04/01/18 2317)   PRN Meds:.sodium chloride, acetaminophen, bisacodyl **OR** bisacodyl, MUSCLE RUB, ondansetron **OR** ondansetron (ZOFRAN) IV, oxyCODONE, sodium chloride flush, traMADol, traZODone  Xrays No results found.  Assessment/Plan: S/P Procedure(s) (LRB): TRICUSPID VALVE REPLACEMENT Using 27 mm Magna Ease Mitral Valve Pericardial Bioprosthesis (N/A) TRANSESOPHAGEAL ECHOCARDIOGRAM (TEE) (N/A)  1 doing well , hemodyn stable with SBP90's to 100's, sinus rhythm 2 sats good on RA 3 ID managing abx 4 on suboxone aand oxy- hopefully wean OXY soon 5 SW consult to assist with palcement 6 leukocytosis signif improved 7 H/H is down- tolerating but close to transfusion threshold- guiac stools 9 d/c wires 10 BS ok kcontrol 11 renal fxn normal   LOS: 13 days    Rowe ClackWayne E Gold PA-C Pager [phone] 04/02/2018  Chart reviewed, patient examined, agree with above. She looks good and feels well overall. Work on placement for prolonged antibiotic course.

## 2018-04-02 NOTE — Progress Notes (Signed)
EPWs pulled per order and protocol.  Pt tolerated well, sites unremarkable, all tips intact.  Pt understands one hr of bedrest with frequent VS.  CCMD notified, will monitor closely.

## 2018-04-02 NOTE — Progress Notes (Signed)
CARDIAC REHAB PHASE I   PRE:  Rate/Rhythm: 80 SR    BP: sitting 95/51    SaO2: 95 RA  MODE:  Ambulation: 1180 ft   POST:  Rate/Rhythm: 88 SR    BP: sitting 96/64     SaO2: 95 RA  Pt moving well, independent. Aware of precautions. Walked independently at slow pace, long distance. Rest x1 due to increased SOB x1. To recliner. Encouraged more IS use as it is hard/painful for her. At 500 mL, which was what she was doing preop. Encouraged more walking. She is working safe plans for d/c. Wants to eventually go outside. [phone]   Harriet MassonRandi Kristan Azelia Reiger CES, ACSM 04/02/2018 2:29 PM

## 2018-04-03 LAB — CBC
HCT: 26.3 % — ABNORMAL LOW (ref 36.0–46.0)
Hemoglobin: 8.2 g/dL — ABNORMAL LOW (ref 12.0–15.0)
MCH: 30.4 pg (ref 26.0–34.0)
MCHC: 31.2 g/dL (ref 30.0–36.0)
MCV: 97.4 fL (ref 78.0–100.0)
Platelets: 313 10*3/uL (ref 150–400)
RBC: 2.7 MIL/uL — ABNORMAL LOW (ref 3.87–5.11)
RDW: 15.4 % (ref 11.5–15.5)
WBC: 8.5 10*3/uL (ref 4.0–10.5)

## 2018-04-03 LAB — AEROBIC/ANAEROBIC CULTURE W GRAM STAIN (SURGICAL/DEEP WOUND): Culture: NO GROWTH

## 2018-04-03 LAB — HEPATITIS C VRS RNA DETECT BY PCR-QUAL: Hepatitis C Vrs RNA by PCR-Qual: POSITIVE — AB

## 2018-04-03 MED ORDER — OXYCODONE HCL 5 MG PO TABS
5.0000 mg | ORAL_TABLET | ORAL | Status: DC | PRN
  Administered 2018-04-06 – 2018-04-15 (×4): 10 mg via ORAL
  Filled 2018-04-03 (×4): qty 2

## 2018-04-03 NOTE — Progress Notes (Signed)
Pharmacy Antibiotic Note  Tiffany Salazar [name] is a 37 y.o. female admitted on 03/20/2018 with endocarditis.  Pharmacy has been consulted for daptomycin dosing. Patient was treated at hospital in [place] from 7/17 to 7-26 for tricuspid endocarditis, MRSA bacteremia and pulmonary septic emboli. On 7/14 she developed a rash that was thought to be caused by Vancomycin and was changed to Daptomycin at that time.  [facility]   Patient currently continues on Daptomycin for known tricuspid valve endocarditis. First negative culture 8/5. WBC is WNL. S/P valve replacement 8/5.   8/1 CK - 18 u/L 8/8 CK - 34 u/L  Patient is receiving bactrim for pulmonary cavitary lesions She is 24 days into therapy for this; consider stopping after 4 weeks per ID. May repeat CT to re-evaluate.  Plan: Daptomycin 600 mg IV Q 24 hours (~8.9 mg/kg) daily  Weekly CK on Thursdays Plan to treat for 6 weeks from 8/8 per ID  Height: 5\' 7"  (170.2 cm) Weight: 148 lb 4.8 oz (67.3 kg) IBW/kg (Calculated) : 61.6  Temp (24hrs), Avg:99.3 F (37.4 C), Min:99 F (37.2 C), Max:99.6 F (37.6 C)  Recent Labs  Lab 03/29/18 1825 03/29/18 1835 03/30/18 0341 03/30/18 1707 03/31/18 0426 04/01/18 0500 04/03/18 0421  WBC 22.2*  --  19.3*  --  21.0* 12.7* 8.5  CREATININE 1.14* 1.10* 1.12* 1.00 0.90 0.89  --     Estimated Creatinine Clearance: 85.8 mL/min (by C-G formula based on SCr of 0.89 mg/dL).    Allergies  Allergen Reactions  . Gentamicin Rash  . Vancomycin Rash    Antimicrobials this admission: Daptomycin 7/27 >>  Bacrtim 7/28 >>   Microbiology results: 7/27 BCx: ng 7/28 BCx: ng 8/4 MRSA PCR: staph positive, MRSA negative 8/5 tissue valve: no anaerobes, pending fungal  Thank you for allowing pharmacy to be a part of this patient's care.  Danae OrleansMegan Andrew Blasius, PharmD PGY1 Pharmacy Resident Phone [phone] 04/03/2018       1:05 PM

## 2018-04-03 NOTE — Progress Notes (Signed)
Clinical Social Worker following pateint for support. CSW acknowledge consult received to assist patient with placement for her IV antibiotics . Patients case has been discussed with Chiropodistassistant director of social work, unfortunately patient will not be able to be placed due to the cost of IV Daptomycin and Suboxone. [facility]. CSW signing off as CSW is unable to place patient in facility.    Marrianne MoodAshley Amandalynn Pitz, MSW,  [employer]CSWA [phone]

## 2018-04-03 NOTE — Progress Notes (Addendum)
      [address]       [place] [id number]             [phone]        5 Days Post-Op Procedure(s) (LRB): TRICUSPID VALVE REPLACEMENT Using 27 mm Magna Ease Mitral Valve Pericardial Bioprosthesis (N/A) TRANSESOPHAGEAL ECHOCARDIOGRAM (TEE) (N/A)  Subjective: Patient sleeping this am. I asked her if she has any complaints and she shook her head no.  Objective: Vital signs in last 24 hours: Temp:  [99 F (37.2 C)-99.6 F (37.6 C)] 99 F (37.2 C) (08/10 0425) Pulse Rate:  [77-91] 77 (08/10 0425) Cardiac Rhythm: Normal sinus rhythm (08/09 1937) Resp:  [15-19] 15 (08/10 0425) BP: (97-106)/(54-62) 101/62 (08/10 0425) SpO2:  [96 %-97 %] 96 % (08/10 0425) Weight:  [67.3 kg] 67.3 kg (08/10 0357)  Pre op weight 68.1 kg Current Weight  04/03/18 67.3 kg       Intake/Output from previous day: 08/09 0701 - 08/10 0700 In: 957 [P.O.:845; IV Piggyback:112] Out: -    Physical Exam:  Cardiovascular: RRR, no murmur Pulmonary: Clear to auscultation bilaterally Abdomen: Soft, non tender, bowel sounds present. Extremities: No lower extremity edema. Wounds: Clean and dry.  No erythema or signs of infection.  Lab Results: CBC: Recent Labs    04/01/18 0500 04/03/18 0421  WBC 12.7* 8.5  HGB 7.3* 8.2*  HCT 23.4* 26.3*  PLT 240 313   BMET:  Recent Labs    04/01/18 0500  NA 136  K 4.0  CL 97*  CO2 31  GLUCOSE 93  BUN 14  CREATININE 0.89  CALCIUM 8.4*    PT/INR:  Lab Results  Component Value Date   INR 1.51 03/29/2018   INR 1.03 03/20/2018   ABG:  INR: Will add last result for INR, ABG once components are confirmed Will add last 4 CBG results once components are confirmed  Assessment/Plan:  1. CV - SR. On Lopressor 12.5 mg bid. 2.  Pulmonary - On room air. Encourage incentive spirometer. 3. ID-On Daptomycin for MRSA TV endocarditis. Per infectious disease, she needs for 6 weeks (start date 8/8) 4.  Acute blood loss anemia - H and H this  am stable at 8.2 and 26.3. Await occult blood to be done prior to considering starting oral iron. 5. History of IV drug abuse-On Suboxone. Is also on Oxy which need to wean 6. Chronic Hep C infection-awaiting RNA and genotype. Per infectious disease  Lelon HuhDonielle M ZimmermanPA-C 04/03/2018,7:24 AM [phone]   Normal sinus rhythm Surgical incision clean and dry Postop care patient examined and medical record reviewed,agree with above note. Kathlee Nationseter Van Trigt III 04/03/2018

## 2018-04-03 NOTE — Progress Notes (Signed)
CARDIAC REHAB PHASE I   PRE:  Rate/Rhythm: 86 SR    BP: sitting 111/68    SaO2:   MODE:  Ambulation: 1300 ft   POST:  Rate/Rhythm: 91 SR    BP: sitting 95/84     SaO2: 95 RA  Tolerated well, no c/o. Feels good, talkative. Return to bed. Inspiring more on IS, 750 mL. [phone]   Harriet Massonandi Kristan Esteven Overfelt CES, ACSM 04/03/2018 1:31 PM

## 2018-04-04 MED ORDER — PHENOL 1.4 % MT LIQD
1.0000 | OROMUCOSAL | Status: DC | PRN
  Administered 2018-04-04: 1 via OROMUCOSAL
  Filled 2018-04-04: qty 177

## 2018-04-04 NOTE — Progress Notes (Addendum)
      [address]       [place] [id number]             [phone]        6 Days Post-Op Procedure(s) (LRB): TRICUSPID VALVE REPLACEMENT Using 27 mm Magna Ease Mitral Valve Pericardial Bioprosthesis (N/A) TRANSESOPHAGEAL ECHOCARDIOGRAM (TEE) (N/A)  Subjective: Patient sleeping this am and awakened. She has no complaints this am  Objective: Vital signs in last 24 hours: Temp:  [99.3 F (37.4 C)-100.2 F (37.9 C)] 99.3 F (37.4 C) (08/11 0530) Pulse Rate:  [88-95] 88 (08/11 0530) Cardiac Rhythm: Normal sinus rhythm (08/11 0530) Resp:  [19-20] 19 (08/11 0530) BP: (96-107)/(55-78) 100/66 (08/11 0530) SpO2:  [96 %-98 %] 96 % (08/11 0530) Weight:  [66.9 kg] 66.9 kg (08/11 0530)  Pre op weight 68.1 kg Current Weight  04/04/18 66.9 kg       Intake/Output from previous day: 08/10 0701 - 08/11 0700 In: 720 [P.O.:720] Out: -    Physical Exam:  Cardiovascular: RRR, no murmur Pulmonary: Clear to auscultation bilaterally Abdomen: Soft, non tender, bowel sounds present. Extremities: No lower extremity edema. Wounds: Clean and dry.  No erythema or signs of infection.  Lab Results: CBC: Recent Labs    04/03/18 0421  WBC 8.5  HGB 8.2*  HCT 26.3*  PLT 313   BMET:  No results for input(s): NA, K, CL, CO2, GLUCOSE, BUN, CREATININE, CALCIUM in the last 72 hours.  PT/INR:  Lab Results  Component Value Date   INR 1.51 03/29/2018   INR 1.03 03/20/2018   ABG:  INR: Will add last result for INR, ABG once components are confirmed Will add last 4 CBG results once components are confirmed  Assessment/Plan:  1. CV - SR. On Lopressor 12.5 mg bid. 2.  Pulmonary - On room air. Encourage incentive spirometer. 3. ID-On Daptomycin for MRSA TV endocarditis. Per infectious disease, she needs for 6 weeks (start date 8/8) 4.  Acute blood loss anemia - H and H this am stable at 8.2 and 26.3. Await occult blood to be done prior to considering starting oral  iron. 5. History of IV drug abuse-On Suboxone. Is also on Oxy which need to wean 6. Chronic Hep C infection-Hep C RNA by PCR positive. Per infectious disease 7. Regarding placement, unable to go to SNF so will have to remain in hospital until antibiotic finished.  Donielle M ZimmermanPA-C 04/04/2018,7:38 AM [phone]  patient examined and medical record reviewed,agree with above note. Kathlee Nationseter Van Trigt III 04/04/2018

## 2018-04-04 NOTE — Progress Notes (Signed)
Pt requesting throat spray.  Mouth assessed with no signs of thrush present.  Order received for Chloraseptic spray.

## 2018-04-05 DIAGNOSIS — Z952 Presence of prosthetic heart valve: Secondary | ICD-10-CM

## 2018-04-05 NOTE — Plan of Care (Signed)
  Problem: Health Behavior/Discharge Planning: Goal: Ability to manage health-related needs will improve Outcome: Progressing   Problem: Clinical Measurements: Goal: Will remain free from infection Outcome: Progressing Goal: Respiratory complications will improve Outcome: Progressing   Problem: Coping: Goal: Level of anxiety will decrease Outcome: Progressing   Problem: Elimination: Goal: Will not experience complications related to bowel motility Outcome: Progressing   Problem: Pain Managment: Goal: General experience of comfort will improve Outcome: Progressing

## 2018-04-05 NOTE — Progress Notes (Addendum)
      [address]       [place] [id number]             [phone]        7 Days Post-Op Procedure(s) (LRB): TRICUSPID VALVE REPLACEMENT Using 27 mm Magna Ease Mitral Valve Pericardial Bioprosthesis (N/A) TRANSESOPHAGEAL ECHOCARDIOGRAM (TEE) (N/A)  Subjective: Patient sleeping this am and awakened. She was given Chloraseptic for sore throat yesterday.   Objective: Vital signs in last 24 hours: Temp:  [98.5 F (36.9 C)-98.7 F (37.1 C)] 98.5 F (36.9 C) (08/12 0539) Pulse Rate:  [78-79] 78 (08/12 0539) Cardiac Rhythm: Normal sinus rhythm (08/12 0700) Resp:  [16] 16 (08/12 0539) BP: (94-101)/(57-62) 101/57 (08/12 0539) SpO2:  [100 %] 100 % (08/12 0539) Weight:  [67.4 kg] 67.4 kg (08/12 0539)  Pre op weight 68.1 kg Current Weight  04/05/18 67.4 kg       Intake/Output from previous day: 08/11 0701 - 08/12 0700 In: 120 [P.O.:120] Out: -    Physical Exam:  Cardiovascular: RRR, no murmur Pulmonary: Clear to auscultation bilaterally Abdomen: Soft, non tender, bowel sounds present. Extremities: No lower extremity edema. Wounds: Clean and dry.  No erythema or signs of infection.  Lab Results: CBC: Recent Labs    04/03/18 0421  WBC 8.5  HGB 8.2*  HCT 26.3*  PLT 313   BMET:  No results for input(s): NA, K, CL, CO2, GLUCOSE, BUN, CREATININE, CALCIUM in the last 72 hours.  PT/INR:  Lab Results  Component Value Date   INR 1.51 03/29/2018   INR 1.03 03/20/2018   ABG:  INR: Will add last result for INR, ABG once components are confirmed Will add last 4 CBG results once components are confirmed  Assessment/Plan:  1. CV - SR. On Lopressor 12.5 mg bid. 2.  Pulmonary - On room air. Encourage incentive spirometer. 3. ID-On Daptomycin for MRSA TV endocarditis and Bactrim. Per infectious disease, she needs for 6 weeks (start date 8/8) 4.  Acute blood loss anemia - H and H this am stable at 8.2 and 26.3.  Have not started oral iron as awaiting  occult blood to be done . 5. History of IV drug abuse-On Suboxone. Is also on Oxy which need to wean 6. Chronic Hep C infection-Hep C RNA by PCR positive. Per infectious disease 7. Regarding placement, unable to go to SNF so will have to remain in hospital until antibiotic finished.  Donielle M ZimmermanPA-C 04/05/2018,7:46 AM [phone]  nsr Sternal incision healing  Agree with above assessment P Donata ClayVan trigt MD

## 2018-04-05 NOTE — Progress Notes (Signed)
Pt up walking at quick pace. HR 98 SR. Sts she has been walking 30 min or so. She looks and feels good. Returned to room and BP 116/73. IS 900 mL. Encouraged pt. She is working on finding things to pass the time. [phone] Tiffany Salazar CES, ACSM 2:57 PM 04/05/2018

## 2018-04-05 NOTE — Progress Notes (Signed)
INFECTIOUS DISEASE PROGRESS NOTE  ID: [name] is a 37 y.o. female with  Principal Problem:   Endocarditis of tricuspid valve Active Problems:   Polysubstance abuse (HCC)   IVDU (intravenous drug user)   MRSA bacteremia   Acute bacterial endocarditis   Hepatitis C antibody positive in blood  Subjective: Denies cravings.  Dislikes taste of suboxone  Abtx:  Anti-infectives (From admission, onward)   Start     Dose/Rate Route Frequency Ordered Stop   04/01/18 0800  metroNIDAZOLE (FLAGYL) tablet 2,000 mg     2,000 mg Oral  Once 03/31/18 1749 04/01/18 0848   03/31/18 1030  cefUROXime (ZINACEF) injection 1.5 g     1.5 g 200 mL/hr over 30 Minutes Intravenous Once 03/31/18 1024 03/31/18 1129   03/29/18 2300  cefUROXime (ZINACEF) 1.5 g in sodium chloride 0.9 % 100 mL IVPB  Status:  Discontinued     1.5 g 200 mL/hr over 30 Minutes Intravenous Every 12 hours 03/29/18 1220 03/31/18 1011   03/29/18 2200  DAPTOmycin (CUBICIN) 600 mg in sodium chloride 0.9 % IVPB     600 mg 224 mL/hr over 30 Minutes Intravenous Daily at bedtime 03/29/18 1633 05/10/18 2359   03/29/18 0400  vancomycin (VANCOCIN) 1,250 mg in sodium chloride 0.9 % 250 mL IVPB  Status:  Discontinued     1,250 mg 166.7 mL/hr over 90 Minutes Intravenous To Surgery 03/28/18 1240 03/29/18 1220   03/29/18 0400  cefUROXime (ZINACEF) 1.5 g in sodium chloride 0.9 % 100 mL IVPB     1.5 g 200 mL/hr over 30 Minutes Intravenous To Surgery 03/28/18 1240 03/29/18 0801   03/29/18 0400  cefUROXime (ZINACEF) 750 mg in sodium chloride 0.9 % 100 mL IVPB     750 mg 200 mL/hr over 30 Minutes Intravenous To Surgery 03/28/18 1240 03/29/18 1100   03/28/18 1500  fluconazole (DIFLUCAN) tablet 150 mg     150 mg Oral  Once 03/28/18 1432 03/28/18 1804   03/26/18 2200  DAPTOmycin (CUBICIN) 550 mg in sodium chloride 0.9 % IVPB  Status:  Discontinued     550 mg 222 mL/hr over 30 Minutes Intravenous Daily at bedtime 03/25/18 1115 03/25/18 1117   03/26/18 2200  DAPTOmycin (CUBICIN) 500 mg in sodium chloride 0.9 % IVPB  Status:  Discontinued     500 mg 220 mL/hr over 30 Minutes Intravenous Daily at bedtime 03/25/18 1117 03/29/18 1633   03/25/18 2200  DAPTOmycin (CUBICIN) 460 mg in sodium chloride 0.9 % IVPB  Status:  Discontinued     460 mg 218.4 mL/hr over 30 Minutes Intravenous Every 24 hours 03/25/18 1115 03/26/18 1512   03/21/18 2200  DAPTOmycin (CUBICIN) 550 mg in sodium chloride 0.9 % IVPB  Status:  Discontinued     550 mg 222 mL/hr over 30 Minutes Intravenous Daily at bedtime 03/21/18 1015 03/25/18 1115   03/21/18 1400  sulfamethoxazole-trimethoprim (BACTRIM DS,SEPTRA DS) 800-160 MG per tablet 1 tablet     1 tablet Oral Every 12 hours 03/21/18 1319 05/10/18 2359   03/20/18 2215  DAPTOmycin (CUBICIN) 544 mg in sodium chloride 0.9 % IVPB  Status:  Discontinued     8 mg/kg  68 kg 221.8 mL/hr over 30 Minutes Intravenous Daily at bedtime 03/20/18 2200 03/21/18 1015   03/20/18 2200  vancomycin (VANCOCIN) 1,500 mg in sodium chloride 0.9 % 500 mL IVPB  Status:  Discontinued     1,500 mg 250 mL/hr over 120 Minutes Intravenous  Once 03/20/18 2156 03/20/18 2203  03/20/18 2130  DAPTOmycin (CUBICIN) 544 mg in sodium chloride 0.9 % IVPB  Status:  Discontinued     8 mg/kg  68 kg 221.8 mL/hr over 30 Minutes Intravenous Daily at bedtime 03/20/18 2125 03/20/18 2151      Medications:  Scheduled: . aspirin EC  325 mg Oral Daily  . buprenorphine-naloxone  1 tablet Sublingual Daily  . busPIRone  15 mg Oral BID  . Chlorhexidine Gluconate Cloth  6 each Topical Daily  . docusate sodium  200 mg Oral Daily  . metoprolol tartrate  12.5 mg Oral BID  . nicotine  21 mg Transdermal Daily  . nutrition supplement (JUVEN)  1 packet Oral BID BM  . pantoprazole  40 mg Oral QAC breakfast  . sodium chloride flush  10-40 mL Intracatheter Q12H  . sulfamethoxazole-trimethoprim  1 tablet Oral Q12H    Objective: Vital signs in last 24 hours: Temp:   [98.5 F (36.9 C)-98.7 F (37.1 C)] 98.5 F (36.9 C) (08/12 0539) Pulse Rate:  [78-79] 78 (08/12 0539) Resp:  [16] 16 (08/12 0539) BP: (94-101)/(57-62) 101/57 (08/12 0539) SpO2:  [100 %] 100 % (08/12 0539) Weight:  [67.4 kg] 67.4 kg (08/12 0539)   General appearance: alert, cooperative and no distress Resp: clear to auscultation bilaterally Cardio: regular rate and rhythm GI: normal findings: bowel sounds normal and soft, non-tender Extremities: edema none  Lab Results Recent Labs    04/03/18 0421  WBC 8.5  HGB 8.2*  HCT 26.3*   Liver Panel No results for input(s): PROT, ALBUMIN, AST, ALT, ALKPHOS, BILITOT, BILIDIR, IBILI in the last 72 hours. Sedimentation Rate No results for input(s): ESRSEDRATE in the last 72 hours. C-Reactive Protein No results for input(s): CRP in the last 72 hours.  Microbiology: Recent Results (from the past 240 hour(s))  Surgical pcr screen     Status: Abnormal   Collection Time: 03/28/18  5:42 AM  Result Value Ref Range Status   MRSA, PCR NEGATIVE NEGATIVE Final   Staphylococcus aureus POSITIVE (A) NEGATIVE Final    Comment: (NOTE) The Xpert SA Assay (FDA approved for NASAL specimens in patients 3 years of age and older), is one component of a comprehensive surveillance program. It is not intended to diagnose infection nor to guide or monitor treatment. Performed at [facility], [address] [place], [place] [address]   Fungus Culture With Stain     Status: None (Preliminary result)   Collection Time: 03/29/18  9:39 AM  Result Value Ref Range Status   Fungus Stain Final report  Final    Comment: (NOTE) Performed At: [facility] [address] [place], [place] [id number] Jolene Schimke MD NU:[id number]    Fungus (Mycology) Culture PENDING  Incomplete   Fungal Source TISSUE  Final    Comment: TRICUSPID VALVE LEAFLETS PATIENT ON FOLLOWING ANCEF   Aerobic/Anaerobic Culture (surgical/deep wound)     Status: None    Collection Time: 03/29/18  9:39 AM  Result Value Ref Range Status   Specimen Description TISSUE TRICUSPID VALVE LEAFLETS  Final   Special Requests PATIENT ON FOLLOWING ANCEF  Final   Gram Stain   Final    FEW WBC PRESENT,BOTH PMN AND MONONUCLEAR NO ORGANISMS SEEN    Culture   Final    No growth aerobically or anaerobically. Performed at [facility], [address] [place], [place] [address]    Report Status 04/03/2018 FINAL  Final  Fungus Culture Result     Status: None  Collection Time: 03/29/18  9:39 AM  Result Value Ref Range Status   Result 1 Comment  Final    Comment: (NOTE) KOH/Calcofluor preparation:  no fungus observed. Performed At: [facility] [address] [place], [place] [id number] Jolene SchimkeNagendra Sanjai MD WU:[phone]     Studies/Results: No results found.   Assessment/Plan: TV IE TV (bioprosthesis) replacement 8-5 Subs use d/o  Since 37 yo  Total days of antibiotics: 14 (dapto, bactrim)  Will continue dapto to 6 weeks post-op Stop bactrim (today is day 15). Will stop at 4 weeks. On po She wants to decrease her suboxone dose, I encouraged her to maintain her dose for at least 6 months I encouraged her to call her ADS support person.           Johny SaxJeffrey Monroe Toure MD, FACP Infectious Diseases (pager) [phone] [email or web address] 04/05/2018, 11:02 AM  LOS: 16 days

## 2018-04-06 LAB — HEPATITIS C GENOTYPE

## 2018-04-06 MED ORDER — BOOST / RESOURCE BREEZE PO LIQD CUSTOM
1.0000 | Freq: Three times a day (TID) | ORAL | Status: DC
  Administered 2018-04-06: 1 via ORAL

## 2018-04-06 MED ORDER — ALTEPLASE 2 MG IJ SOLR
2.0000 mg | Freq: Once | INTRAMUSCULAR | Status: AC
  Administered 2018-04-15: 2 mg

## 2018-04-06 NOTE — Progress Notes (Signed)
Pt up in hall, feels well. sts she hasn't done her major walk yet today but will later. Yesterday she walked 90 min. She is working on getting a routine daily and focusing of doing productive things. I brought her yarn and needles. Seems to be doing well, making plans for the future.  [phone] Ethelda ChickKristan Mohd Clemons CES, ACSM 3:30 PM 04/06/2018

## 2018-04-06 NOTE — Progress Notes (Addendum)
      [address]       [place] [id number]             [phone]        8 Days Post-Op Procedure(s) (LRB): TRICUSPID VALVE REPLACEMENT Using 27 mm Magna Ease Mitral Valve Pericardial Bioprosthesis (N/A) TRANSESOPHAGEAL ECHOCARDIOGRAM (TEE) (N/A)  Subjective: Patient sleeping-awakened. She has no complaints this am.  Objective: Vital signs in last 24 hours: Temp:  [98.2 F (36.8 C)-100.3 F (37.9 C)] 98.2 F (36.8 C) (08/13 0606) Pulse Rate:  [76-102] 85 (08/13 0606) Cardiac Rhythm: Normal sinus rhythm (08/13 0700) Resp:  [13-18] 17 (08/13 0606) BP: (90-116)/(54-73) 90/54 (08/13 0606) SpO2:  [96 %-100 %] 96 % (08/13 0606) Weight:  [67.5 kg] 67.5 kg (08/13 0606)  Pre op weight 68.1 kg Current Weight  04/06/18 67.5 kg      Intake/Output from previous day: No intake/output data recorded.   Physical Exam:  Cardiovascular: RRR, no murmur Pulmonary: Clear to auscultation bilaterally Abdomen: Soft, non tender, bowel sounds present. Extremities: No lower extremity edema. Wounds: Clean and dry.  No erythema or signs of infection.  Lab Results: CBC: No results for input(s): WBC, HGB, HCT, PLT in the last 72 hours. BMET:  No results for input(s): NA, K, CL, CO2, GLUCOSE, BUN, CREATININE, CALCIUM in the last 72 hours.  PT/INR:  Lab Results  Component Value Date   INR 1.51 03/29/2018   INR 1.03 03/20/2018   ABG:  INR: Will add last result for INR, ABG once components are confirmed Will add last 4 CBG results once components are confirmed  Assessment/Plan:  1. CV - SR. On Lopressor 12.5 mg bid. 2.  Pulmonary - On room air. Encourage incentive spirometer. 3. ID-On Daptomycin (day 15 of 42) for MRSA TV endocarditis and Bactrim (day 16/28). Per infectious disease, she needs for 6 weeks (start date 8/8) 4.  Acute blood loss anemia - H and H this am stable at 8.2 and 26.3.  Have not started oral iron as awaiting occult blood to be done . 5.  History of IV drug abuse-On Suboxone. Is also on Oxy which need to wean 6. Chronic Hep C infection-Hep C RNA by PCR positive. Per infectious disease 7. Regarding placement, unable to go to SNF so will have to remain in hospital until antibiotic finished.  Donielle M ZimmermanPA-C 04/06/2018,7:26 AM [phone]   Chart reviewed, patient examined, agree with above. She is doing well overall. I think she can shower. She can go outside with nurse or aide supervising since she will likely be her for the 6 weeks of antibiotics.

## 2018-04-06 NOTE — Progress Notes (Addendum)
INFECTIOUS DISEASE PROGRESS NOTE  ID: Tiffany Salazar is a 37 y.o. female with  Principal Problem:   Endocarditis of tricuspid valve Active Problems:   Polysubstance abuse (HCC)   IVDU (intravenous drug user)   MRSA bacteremia   Acute bacterial endocarditis   Hepatitis C antibody positive in blood  Subjective: Complains of headaches.   Abtx:  Anti-infectives (From admission, onward)   Start     Dose/Rate Route Frequency Ordered Stop   04/01/18 0800  metroNIDAZOLE (FLAGYL) tablet 2,000 mg     2,000 mg Oral  Once 03/31/18 1749 04/01/18 0848   03/31/18 1030  cefUROXime (ZINACEF) injection 1.5 g     1.5 g 200 mL/hr over 30 Minutes Intravenous Once 03/31/18 1024 03/31/18 1129   03/29/18 2300  cefUROXime (ZINACEF) 1.5 g in sodium chloride 0.9 % 100 mL IVPB  Status:  Discontinued     1.5 g 200 mL/hr over 30 Minutes Intravenous Every 12 hours 03/29/18 1220 03/31/18 1011   03/29/18 2200  DAPTOmycin (CUBICIN) 600 mg in sodium chloride 0.9 % IVPB     600 mg 224 mL/hr over 30 Minutes Intravenous Daily at bedtime 03/29/18 1633 05/10/18 2359   03/29/18 0400  vancomycin (VANCOCIN) 1,250 mg in sodium chloride 0.9 % 250 mL IVPB  Status:  Discontinued     1,250 mg 166.7 mL/hr over 90 Minutes Intravenous To Surgery 03/28/18 1240 03/29/18 1220   03/29/18 0400  cefUROXime (ZINACEF) 1.5 g in sodium chloride 0.9 % 100 mL IVPB     1.5 g 200 mL/hr over 30 Minutes Intravenous To Surgery 03/28/18 1240 03/29/18 0801   03/29/18 0400  cefUROXime (ZINACEF) 750 mg in sodium chloride 0.9 % 100 mL IVPB     750 mg 200 mL/hr over 30 Minutes Intravenous To Surgery 03/28/18 1240 03/29/18 1100   03/28/18 1500  fluconazole (DIFLUCAN) tablet 150 mg     150 mg Oral  Once 03/28/18 1432 03/28/18 1804   03/26/18 2200  DAPTOmycin (CUBICIN) 550 mg in sodium chloride 0.9 % IVPB  Status:  Discontinued     550 mg 222 mL/hr over 30 Minutes Intravenous Daily at bedtime 03/25/18 1115 03/25/18 1117   03/26/18 2200   DAPTOmycin (CUBICIN) 500 mg in sodium chloride 0.9 % IVPB  Status:  Discontinued     500 mg 220 mL/hr over 30 Minutes Intravenous Daily at bedtime 03/25/18 1117 03/29/18 1633   03/25/18 2200  DAPTOmycin (CUBICIN) 460 mg in sodium chloride 0.9 % IVPB  Status:  Discontinued     460 mg 218.4 mL/hr over 30 Minutes Intravenous Every 24 hours 03/25/18 1115 03/26/18 1512   03/21/18 2200  DAPTOmycin (CUBICIN) 550 mg in sodium chloride 0.9 % IVPB  Status:  Discontinued     550 mg 222 mL/hr over 30 Minutes Intravenous Daily at bedtime 03/21/18 1015 03/25/18 1115   03/21/18 1400  sulfamethoxazole-trimethoprim (BACTRIM DS,SEPTRA DS) 800-160 MG per tablet 1 tablet     1 tablet Oral Every 12 hours 03/21/18 1319 04/19/18 2359   03/20/18 2215  DAPTOmycin (CUBICIN) 544 mg in sodium chloride 0.9 % IVPB  Status:  Discontinued     8 mg/kg  68 kg 221.8 mL/hr over 30 Minutes Intravenous Daily at bedtime 03/20/18 2200 03/21/18 1015   03/20/18 2200  vancomycin (VANCOCIN) 1,500 mg in sodium chloride 0.9 % 500 mL IVPB  Status:  Discontinued     1,500 mg 250 mL/hr over 120 Minutes Intravenous  Once 03/20/18 2156 03/20/18 2203  03/20/18 2130  DAPTOmycin (CUBICIN) 544 mg in sodium chloride 0.9 % IVPB  Status:  Discontinued     8 mg/kg  68 kg 221.8 mL/hr over 30 Minutes Intravenous Daily at bedtime 03/20/18 2125 03/20/18 2151      Medications:  Scheduled: . aspirin EC  325 mg Oral Daily  . buprenorphine-naloxone  1 tablet Sublingual Daily  . busPIRone  15 mg Oral BID  . Chlorhexidine Gluconate Cloth  6 each Topical Daily  . docusate sodium  200 mg Oral Daily  . feeding supplement  1 Container Oral TID BM  . metoprolol tartrate  12.5 mg Oral BID  . nicotine  21 mg Transdermal Daily  . pantoprazole  40 mg Oral QAC breakfast  . sodium chloride flush  10-40 mL Intracatheter Q12H  . sulfamethoxazole-trimethoprim  1 tablet Oral Q12H    Objective: Vital signs in last 24 hours: Temp:  [98.2 F (36.8 C)-100.3  F (37.9 C)] 98.2 F (36.8 C) (08/13 0606) Pulse Rate:  [85-102] 85 (08/13 0606) Resp:  [13-17] 17 (08/13 0606) BP: (90-116)/(54-73) 90/54 (08/13 0606) SpO2:  [96 %-98 %] 96 % (08/13 0606) Weight:  [67.5 kg] 67.5 kg (08/13 0606)   General appearance: alert, cooperative and no distress Chest wall: wound clean.   Lab Results No results for input(s): WBC, HGB, HCT, NA, K, CL, CO2, BUN, CREATININE, GLU in the last 72 hours.  Invalid input(s): PLATELETS Liver Panel No results for input(s): PROT, ALBUMIN, AST, ALT, ALKPHOS, BILITOT, BILIDIR, IBILI in the last 72 hours. Sedimentation Rate No results for input(s): ESRSEDRATE in the last 72 hours. C-Reactive Protein No results for input(s): CRP in the last 72 hours.  Microbiology: Recent Results (from the past 240 hour(s))  Surgical pcr screen     Status: Abnormal   Collection Time: 03/28/18  5:42 AM  Result Value Ref Range Status   MRSA, PCR NEGATIVE NEGATIVE Final   Staphylococcus aureus POSITIVE (A) NEGATIVE Final    Comment: (NOTE) The Xpert SA Assay (FDA approved for NASAL specimens in patients 38 years of age and older), is one component of a comprehensive surveillance program. It is not intended to diagnose infection nor to guide or monitor treatment. Performed at [facility], [address] [place], [place] [address]   Fungus Culture With Stain     Status: None (Preliminary result)   Collection Time: 03/29/18  9:39 AM  Result Value Ref Range Status   Fungus Stain Final report  Final    Comment: (NOTE) Performed At: [facility] [address] [place], [place] [id number] Jolene Schimke MD AO:[id number]    Fungus (Mycology) Culture PENDING  Incomplete   Fungal Source TISSUE  Final    Comment: TRICUSPID VALVE LEAFLETS PATIENT ON FOLLOWING ANCEF   Aerobic/Anaerobic Culture (surgical/deep wound)     Status: None   Collection Time: 03/29/18  9:39 AM  Result Value Ref Range Status   Specimen  Description TISSUE TRICUSPID VALVE LEAFLETS  Final   Special Requests PATIENT ON FOLLOWING ANCEF  Final   Gram Stain   Final    FEW WBC PRESENT,BOTH PMN AND MONONUCLEAR NO ORGANISMS SEEN    Culture   Final    No growth aerobically or anaerobically. Performed at [facility], [address], [place], [place] [address]    Report Status 04/03/2018 FINAL  Final  Fungus Culture Result     Status: None   Collection Time: 03/29/18  9:39 AM  Result Value Ref  Range Status   Result 1 Comment  Final    Comment: (NOTE) KOH/Calcofluor preparation:  no fungus observed. Performed At: [facility] [address] [place], [place] [id number] Jolene SchimkeNagendra Sanjai MD WU:[id number]:[phone]     Studies/Results: No results found.   Assessment/Plan: TV IE TV (bioprosthesis) replacement 8-5 Subs use d/o             Since 37 yo  Total days of antibiotics: 15 (dapto, bactrim)  Will continue dapto to 6 weeks post-op Stop bactrim at 4 weeks. On po. Repeat her Hep C genotype and VL No problems with suboxone today I encouraged her to call her ADS support person. she wants to wait til she knows where she is going to move to at d/c (her boyfriend is interviewing for jobs).  Will follow weekly         Johny SaxJeffrey Lachele Lievanos MD, FACP Infectious Diseases (pager) [phone] [email or web address] 04/06/2018, 2:11 PM  LOS: 17 days

## 2018-04-06 NOTE — Progress Notes (Signed)
Nutrition Follow-up  DOCUMENTATION CODES:   Not applicable  INTERVENTION:    Boost Breeze po TID, each supplement provides 250 kcal and 9 grams of protein   D/C Juven BID  NEW NUTRITION DIAGNOSIS:   Increased nutrient needs related to acute illness as evidenced by estimated needs, ongoing  GOAL:   Patient will meet greater than or equal to 90% of their needs, progressing  MONITOR:   PO intake, Supplement acceptance, Weight trends, Labs  ASSESSMENT:   37 y.o. female with a history of female with a history of IVDU, polysubstance abuse. [place] [facility] [place] [facility] she was admitted for the treatment of TV endocarditis and MRSA bacteremia with associated septic pulmonary emboli. [facility].   Pt s/p procedure 8/5: TRICUSPID VALVE REPLACEMENT  Pt currently on a Regular diet. PO intake variable at 25-100% per flowsheets. Spoke with RN. Juven is ordered, however, pt is not taking. Labs and medications reviewed. Finishing her IV ABX.  Diet Order:   Diet Order            Diet regular Room service appropriate? Yes; Fluid consistency: Thin  Diet effective now             EDUCATION NEEDS:   No education needs have been identified at this time  Skin:  Skin Assessment: Reviewed RN Assessment  Last BM:  8/11  Height:   Ht Readings from Last 1 Encounters:  03/23/18 5\' 7"  (1.702 m)   Weight:   Wt Readings from Last 1 Encounters:  04/06/18 67.5 kg   Ideal Body Weight:  61.36 kg  BMI:  Body mass index is 23.32 kg/m.  Estimated Nutritional Needs:   Kcal:  1700-1900  Protein:  75-90 gm  Fluid:  1.7-1.9 L  Maureen ChattersKatie Rozelia Catapano, RD, LDN Pager #: [phone] After-Hours Pager #: [phone]]

## 2018-04-07 NOTE — Plan of Care (Signed)
?  Problem: Health Behavior/Discharge Planning: ?Goal: Ability to manage health-related needs will improve ?Outcome: Progressing ?  ?Problem: Clinical Measurements: ?Goal: Will remain free from infection ?Outcome: Progressing ?Goal: Respiratory complications will improve ?Outcome: Progressing ?  ?

## 2018-04-07 NOTE — Progress Notes (Signed)
Patient stated that Dr Laneta SimmersBartle said that her chest tube sutures can be removed today, Gershon CraneWayne Gold PA , verbal [facility] from [facility] to remove chest tube sutures.

## 2018-04-07 NOTE — Progress Notes (Addendum)
      [address]       [place] [id number]             [phone]      9 Days Post-Op Procedure(s) (LRB): TRICUSPID VALVE REPLACEMENT Using 27 mm Magna Ease Mitral Valve Pericardial Bioprosthesis (N/A) TRANSESOPHAGEAL ECHOCARDIOGRAM (TEE) (N/A) Subjective: Feels fine  Objective: Vital signs in last 24 hours: Temp:  [98.8 F (37.1 C)-99.2 F (37.3 C)] 99.2 F (37.3 C) (08/14 0418) Pulse Rate:  [76-79] 79 (08/14 0418) Cardiac Rhythm: Normal sinus rhythm (08/14 0700) Resp:  [16-18] 16 (08/13 2005) BP: (98-100)/(50-63) 99/53 (08/14 0418) SpO2:  [95 %-100 %] 95 % (08/14 0418) Weight:  [68.8 kg] 68.8 kg (08/14 0418)  Hemodynamic parameters for last 24 hours:    Intake/Output from previous day: 08/13 0701 - 08/14 0700 In: 4062.3 [IV Piggyback:4062.3] Out: -  Intake/Output this shift: No intake/output data recorded.  General appearance: alert, cooperative and no distress Heart: regular rate and rhythm Lungs: min dim in bases Abdomen: benign Extremities: no edema Wound: incis healing well  Lab Results: No results for input(s): WBC, HGB, HCT, PLT in the last 72 hours. BMET: No results for input(s): NA, K, CL, CO2, GLUCOSE, BUN, CREATININE, CALCIUM in the last 72 hours.  PT/INR: No results for input(s): LABPROT, INR in the last 72 hours. ABG    Component Value Date/Time   PHART 7.361 03/29/2018 1911   HCO3 24.2 03/29/2018 1911   TCO2 27 03/30/2018 1707   ACIDBASEDEF 1.0 03/29/2018 1911   O2SAT 100.0 03/29/2018 1911   CBG (last 3)  No results for input(s): GLUCAP in the last 72 hours.  Meds Scheduled Meds: . alteplase  2 mg Intracatheter Once  . aspirin EC  325 mg Oral Daily  . buprenorphine-naloxone  1 tablet Sublingual Daily  . busPIRone  15 mg Oral BID  . Chlorhexidine Gluconate Cloth  6 each Topical Daily  . docusate sodium  200 mg Oral Daily  . feeding supplement  1 Container Oral TID BM  . metoprolol tartrate  12.5 mg Oral BID  .  nicotine  21 mg Transdermal Daily  . pantoprazole  40 mg Oral QAC breakfast  . sodium chloride flush  10-40 mL Intracatheter Q12H  . sulfamethoxazole-trimethoprim  1 tablet Oral Q12H   Continuous Infusions: . sodium chloride Stopped (03/31/18 2156)  . DAPTOmycin (CUBICIN)  IV 600 mg (04/06/18 2211)   PRN Meds:.sodium chloride, acetaminophen, bisacodyl **OR** bisacodyl, ondansetron **OR** ondansetron (ZOFRAN) IV, oxyCODONE, phenol, sodium chloride flush, traMADol, traZODone  Xrays No results found.  Assessment/Plan: S/P Procedure(s) (LRB): TRICUSPID VALVE REPLACEMENT Using 27 mm Magna Ease Mitral Valve Pericardial Bioprosthesis (N/A) TRANSESOPHAGEAL ECHOCARDIOGRAM (TEE) (N/A)  1 conts to do well 2 tmax 99.2 3 hemodyn stable in sinus, BP runs relatively low- only on 12.5 of metoprolol 4 sats good on RA 5 cont abx as per current ID recs 6 no new labs today 7 routine rehab/pulm toilet  LOS: 18 days    Rowe ClackWayne E Gold 04/07/2018   Chart reviewed, patient examined, agree with above. Feels well. Incision healing well.

## 2018-04-07 NOTE — Progress Notes (Signed)
Chest tube sutures removed as ordered.  

## 2018-04-08 LAB — BASIC METABOLIC PANEL
Anion gap: 7 (ref 5–15)
BUN: 14 mg/dL (ref 6–20)
CO2: 27 mmol/L (ref 22–32)
Calcium: 8.5 mg/dL — ABNORMAL LOW (ref 8.9–10.3)
Chloride: 103 mmol/L (ref 98–111)
Creatinine, Ser: 1.14 mg/dL — ABNORMAL HIGH (ref 0.44–1.00)
GFR calc Af Amer: >60 mL/min (ref >60)
GFR calc non Af Amer: >60 mL/min (ref >60)
Glucose, Bld: 93 mg/dL (ref 70–99)
Potassium: 4.3 mmol/L (ref 3.5–5.1)
Sodium: 137 mmol/L (ref 135–145)

## 2018-04-08 LAB — CK: Total CK: 25 U/L — ABNORMAL LOW (ref 38–234)

## 2018-04-08 LAB — HEPATITIS C VRS RNA DETECT BY PCR-QUAL: Hepatitis C Vrs RNA by PCR-Qual: POSITIVE — AB

## 2018-04-08 NOTE — Progress Notes (Addendum)
Pharmacy Antibiotic Note  Tiffany Salazar is a 37 y.o. female admitted on 03/20/2018 with TV endocarditis.  Pharmacy has been consulted for daptomycin dosing x 6 weeks. [place] was [place] hospital in [place] from 7/17 to 7-26 for tricuspid endocarditis, MRSA bacteremia and pulmonary septic emboli. On 7/14 she developed a rash that was thought to be caused by Vancomycin and was changed to Daptomycin at that time. [facility] 7[facility]26 [facility] All cultures negative inpatient. Afebrile, WBC WNL. S/P valve replacement 8/5. CK stable at 25 today. SCr up to 1.14.  Patient is also receiving Bactrim for pulmonary cavitary lesions - continue x 4 weeks per ID.  Plan: Daptomycin 600mg  (~8-9 mg/kg) IV Q 24 hours BMET/CK every Thursday Per ID - Dapto 6 wks post-op (through 9/16) and Septra DS for 4 wks (ordered through 8/26 per ID)  Height: 5\' 7"  (170.2 cm) Weight: 150 lb 14.4 oz (68.4 kg) IBW/kg (Calculated) : 61.6  Temp (24hrs), Avg:98.6 F (37 C), Min:98.4 F (36.9 C), Max:98.8 F (37.1 C)  Recent Labs  Lab 04/03/18 0421 04/08/18 0500  WBC 8.5  --   CREATININE  --  1.14*    Estimated Creatinine Clearance: 67 mL/min (A) (by C-G formula based on SCr of 1.14 mg/dL (H)).    Allergies  Allergen Reactions  . Gentamicin Rash  . Vancomycin Rash    Antimicrobials this admission: Daptomycin (keep >8mg /kg) 7/26 >> (9/16) Bactrim 7/28 (for septic PE)>>(8/14 updated per 8/9 note) Cefuroxime 8/5>>8/7 Metronidazole 2g x 1 for trichomonas noted on 8/5  Microbiology results: 7/28 Blood x 2 - negative 7/29 MRSA PCR negative 8/4 MRSA PCR: negative for MRSA, positive for Staph aureus 8/5 Tissue valve: no organisms seen 8/5 tissue valve fungal culture - pending  Tiffany Salazar, PharmD, BCPS Clinical Pharmacist Clinical phone [phone] Please check AMION for all [facility] contact numbers 04/08/2018 10:47 AM

## 2018-04-08 NOTE — Progress Notes (Addendum)
      [address]       [place] [id number]             [phone]      10 Days Post-Op Procedure(s) (LRB): TRICUSPID VALVE REPLACEMENT Using 27 mm Magna Ease Mitral Valve Pericardial Bioprosthesis (N/A) TRANSESOPHAGEAL ECHOCARDIOGRAM (TEE) (N/A) Subjective: Feels fine, no new complaints except didn't sleep well  Objective: Vital signs in last 24 hours: Temp:  [98.4 F (36.9 C)-98.8 F (37.1 C)] 98.4 F (36.9 C) (08/15 0432) Pulse Rate:  [69-86] 74 (08/15 0417) Cardiac Rhythm: Normal sinus rhythm (08/15 0700) Resp:  [18-24] 24 (08/15 0417) BP: (95-106)/(56-61) 106/57 (08/15 0432) SpO2:  [96 %-100 %] 100 % (08/15 0417) Weight:  [68.4 kg] 68.4 kg (08/15 0416)  Hemodynamic parameters for last 24 hours:    Intake/Output from previous day: 08/14 0701 - 08/15 0700 In: 730 [P.O.:480; IV Piggyback:250] Out: -  Intake/Output this shift: No intake/output data recorded.  General appearance: alert, cooperative and no distress Heart: regular rate and rhythm Lungs: clear to auscultation bilaterally Abdomen: benign Extremities: no edema Wound: incis healing well  Lab Results: No results for input(s): WBC, HGB, HCT, PLT in the last 72 hours. BMET:  Recent Labs    04/08/18 0500  NA 137  K 4.3  CL 103  CO2 27  GLUCOSE 93  BUN 14  CREATININE 1.14*  CALCIUM 8.5*    PT/INR: No results for input(s): LABPROT, INR in the last 72 hours. ABG    Component Value Date/Time   PHART 7.361 03/29/2018 1911   HCO3 24.2 03/29/2018 1911   TCO2 27 03/30/2018 1707   ACIDBASEDEF 1.0 03/29/2018 1911   O2SAT 100.0 03/29/2018 1911   CBG (last 3)  No results for input(s): GLUCAP in the last 72 hours.  Meds Scheduled Meds: . alteplase  2 mg Intracatheter Once  . aspirin EC  325 mg Oral Daily  . buprenorphine-naloxone  1 tablet Sublingual Daily  . busPIRone  15 mg Oral BID  . Chlorhexidine Gluconate Cloth  6 each Topical Daily  . docusate sodium  200 mg Oral Daily    . feeding supplement  1 Container Oral TID BM  . metoprolol tartrate  12.5 mg Oral BID  . nicotine  21 mg Transdermal Daily  . pantoprazole  40 mg Oral QAC breakfast  . sodium chloride flush  10-40 mL Intracatheter Q12H  . sulfamethoxazole-trimethoprim  1 tablet Oral Q12H   Continuous Infusions: . sodium chloride Stopped (03/31/18 2156)  . DAPTOmycin (CUBICIN)  IV Stopped (04/07/18 2240)   PRN Meds:.sodium chloride, acetaminophen, bisacodyl **OR** bisacodyl, ondansetron **OR** ondansetron (ZOFRAN) IV, oxyCODONE, phenol, sodium chloride flush, traMADol, traZODone  Xrays No results found.  Assessment/Plan: S/P Procedure(s) (LRB): TRICUSPID VALVE REPLACEMENT Using 27 mm Magna Ease Mitral Valve Pericardial Bioprosthesis (N/A) TRANSESOPHAGEAL ECHOCARDIOGRAM (TEE) (N/A) Hemodyn stable in sinus rhythm conts current management   LOS: 19 days    Tiffany Salazar 04/08/2018   Chart reviewed, patient examined, agree with above. No problems. Incision healing well. Continue antibiotics.

## 2018-04-08 NOTE — Progress Notes (Signed)
Pt getting ready to shower. Discussed sternal precautions, IS (needs encouragement with this), ex gl, smoking cessation, and CRPII. Will refer to G'sO CRPII. She is actually wanting a cigarette now. She is not sure about quitting at d/c. Gave her resources and encouraged her to think about it. She sts she is feeling some depression or lack of motivation. Has had a hard time finding interests and wanting to stay in her room.  [phone] Ethelda ChickKristan Jackie Russman CES, ACSM 2:24 PM 04/08/2018

## 2018-04-09 DIAGNOSIS — F1190 Opioid use, unspecified, uncomplicated: Secondary | ICD-10-CM

## 2018-04-09 DIAGNOSIS — N760 Acute vaginitis: Secondary | ICD-10-CM

## 2018-04-09 DIAGNOSIS — B373 Candidiasis of vulva and vagina: Secondary | ICD-10-CM

## 2018-04-09 MED ORDER — ALTEPLASE 2 MG IJ SOLR
2.0000 mg | Freq: Once | INTRAMUSCULAR | Status: AC
  Administered 2018-04-09: 2 mg

## 2018-04-09 MED ORDER — FLUCONAZOLE 100 MG PO TABS: 100.0000 mg | ORAL_TABLET | Freq: Every day | ORAL | Status: DC

## 2018-04-09 MED ORDER — FLUCONAZOLE 150 MG PO TABS
150.0000 mg | ORAL_TABLET | ORAL | Status: AC
  Administered 2018-04-09 – 2018-04-12 (×2): 150 mg via ORAL
  Filled 2018-04-09 (×2): qty 1

## 2018-04-09 NOTE — Progress Notes (Signed)
INFECTIOUS DISEASE PROGRESS NOTE  ID: Tiffany Salazar [name] is a 37 y.o. female with  Principal Problem:   Endocarditis of tricuspid valve Active Problems:   Polysubstance abuse (HCC)   IVDU (intravenous drug user)   MRSA bacteremia   Acute bacterial endocarditis   Hepatitis C antibody positive in blood  Subjective: C/o yeast infection No opioid cravings  Abtx:  Anti-infectives (From admission, onward)   Start     Dose/Rate Route Frequency Ordered Stop   04/01/18 0800  metroNIDAZOLE (FLAGYL) tablet 2,000 mg     2,000 mg Oral  Once 03/31/18 1749 04/01/18 0848   03/31/18 1030  cefUROXime (ZINACEF) injection 1.5 g     1.5 g 200 mL/hr over 30 Minutes Intravenous Once 03/31/18 1024 03/31/18 1129   03/29/18 2300  cefUROXime (ZINACEF) 1.5 g in sodium chloride 0.9 % 100 mL IVPB  Status:  Discontinued     1.5 g 200 mL/hr over 30 Minutes Intravenous Every 12 hours 03/29/18 1220 03/31/18 1011   03/29/18 2200  DAPTOmycin (CUBICIN) 600 mg in sodium chloride 0.9 % IVPB     600 mg 224 mL/hr over 30 Minutes Intravenous Daily at bedtime 03/29/18 1633 05/10/18 2359   03/29/18 0400  vancomycin (VANCOCIN) 1,250 mg in sodium chloride 0.9 % 250 mL IVPB  Status:  Discontinued     1,250 mg 166.7 mL/hr over 90 Minutes Intravenous To Surgery 03/28/18 1240 03/29/18 1220   03/29/18 0400  cefUROXime (ZINACEF) 1.5 g in sodium chloride 0.9 % 100 mL IVPB     1.5 g 200 mL/hr over 30 Minutes Intravenous To Surgery 03/28/18 1240 03/29/18 0801   03/29/18 0400  cefUROXime (ZINACEF) 750 mg in sodium chloride 0.9 % 100 mL IVPB     750 mg 200 mL/hr over 30 Minutes Intravenous To Surgery 03/28/18 1240 03/29/18 1100   03/28/18 1500  fluconazole (DIFLUCAN) tablet 150 mg     150 mg Oral  Once 03/28/18 1432 03/28/18 1804   03/26/18 2200  DAPTOmycin (CUBICIN) 550 mg in sodium chloride 0.9 % IVPB  Status:  Discontinued     550 mg 222 mL/hr over 30 Minutes Intravenous Daily at bedtime 03/25/18 1115 03/25/18 1117   03/26/18 2200  DAPTOmycin (CUBICIN) 500 mg in sodium chloride 0.9 % IVPB  Status:  Discontinued     500 mg 220 mL/hr over 30 Minutes Intravenous Daily at bedtime 03/25/18 1117 03/29/18 1633   03/25/18 2200  DAPTOmycin (CUBICIN) 460 mg in sodium chloride 0.9 % IVPB  Status:  Discontinued     460 mg 218.4 mL/hr over 30 Minutes Intravenous Every 24 hours 03/25/18 1115 03/26/18 1512   03/21/18 2200  DAPTOmycin (CUBICIN) 550 mg in sodium chloride 0.9 % IVPB  Status:  Discontinued     550 mg 222 mL/hr over 30 Minutes Intravenous Daily at bedtime 03/21/18 1015 03/25/18 1115   03/21/18 1400  sulfamethoxazole-trimethoprim (BACTRIM DS,SEPTRA DS) 800-160 MG per tablet 1 tablet     1 tablet Oral Every 12 hours 03/21/18 1319 04/19/18 2359   03/20/18 2215  DAPTOmycin (CUBICIN) 544 mg in sodium chloride 0.9 % IVPB  Status:  Discontinued     8 mg/kg  68 kg 221.8 mL/hr over 30 Minutes Intravenous Daily at bedtime 03/20/18 2200 03/21/18 1015   03/20/18 2200  vancomycin (VANCOCIN) 1,500 mg in sodium chloride 0.9 % 500 mL IVPB  Status:  Discontinued     1,500 mg 250 mL/hr over 120 Minutes Intravenous  Once 03/20/18 2156 03/20/18 2203  03/20/18 2130  DAPTOmycin (CUBICIN) 544 mg in sodium chloride 0.9 % IVPB  Status:  Discontinued     8 mg/kg  68 kg 221.8 mL/hr over 30 Minutes Intravenous Daily at bedtime 03/20/18 2125 03/20/18 2151      Medications:  Scheduled: . alteplase  2 mg Intracatheter Once  . aspirin EC  325 mg Oral Daily  . buprenorphine-naloxone  1 tablet Sublingual Daily  . busPIRone  15 mg Oral BID  . Chlorhexidine Gluconate Cloth  6 each Topical Daily  . docusate sodium  200 mg Oral Daily  . feeding supplement  1 Container Oral TID BM  . metoprolol tartrate  12.5 mg Oral BID  . nicotine  21 mg Transdermal Daily  . pantoprazole  40 mg Oral QAC breakfast  . sodium chloride flush  10-40 mL Intracatheter Q12H  . sulfamethoxazole-trimethoprim  1 tablet Oral Q12H    Objective: Vital  signs in last 24 hours: Temp:  [98.4 F (36.9 C)-99.6 F (37.6 C)] 98.4 F (36.9 C) (08/16 1253) Pulse Rate:  [79-93] 93 (08/16 1253) Resp:  [18-25] 25 (08/16 1253) BP: (95-112)/(57-74) 100/61 (08/16 1253) SpO2:  [98 %] 98 % (08/16 1253) Weight:  [68 kg] 68 kg (08/16 0358)   General appearance: alert, cooperative and no distress  Lab Results Recent Labs    04/08/18 0500  NA 137  K 4.3  CL 103  CO2 27  BUN 14  CREATININE 1.14*   Liver Panel Recent Labs    04/06/18 1800  ALT LAVTOP   Sedimentation Rate No results for input(s): ESRSEDRATE in the last 72 hours. C-Reactive Protein No results for input(s): CRP in the last 72 hours.  Microbiology: No results found for this or any previous visit (from the past 240 hour(s)).  Studies/Results: No results found.   Assessment/Plan: TV IE TV (bioprosthesis) replacement 8-5 Subs use d/o Since 37 yo vaginitis  Total days of antibiotics:18 (dapto, bactrim)  Short course of diflucan Will continue dapto to 6 weeks post-op (end date 05-10-18)   Stop bactrim at 4 weeks. On po. No problems with suboxone I encouraged her to call her ADS support person.she wants to wait til she knows where she is going to move to at d/c (her boyfriend is interviewing for jobs).  Please have her f/u in ID clinic after d/c.   Available as needed.         Johny SaxJeffrey Hatcher MD, FACP Infectious Diseases (pager) [phone] [email or web address] 04/09/2018, 2:10 PM  LOS: 20 days

## 2018-04-09 NOTE — Progress Notes (Signed)
Patient ambulated in hallway independently. Will monitor patient. Jameil Whitmoyer Jessup RN  

## 2018-04-09 NOTE — Progress Notes (Signed)
      [address]       [place] [id number]             [phone]      11 Days Post-Op Procedure(s) (LRB): TRICUSPID VALVE REPLACEMENT Using 27 mm Magna Ease Mitral Valve Pericardial Bioprosthesis (N/A) TRANSESOPHAGEAL ECHOCARDIOGRAM (TEE) (N/A) Subjective: Some neck discomfort  Objective: Vital signs in last 24 hours: Temp:  [98.4 F (36.9 C)-99.6 F (37.6 C)] 98.4 F (36.9 C) (08/16 0358) Pulse Rate:  [74-84] 84 (08/15 2221) Cardiac Rhythm: Normal sinus rhythm (08/15 2053) Resp:  [18] 18 (08/15 1947) BP: (91-112)/(56-74) 100/57 (08/16 0358) SpO2:  [98 %-100 %] 98 % (08/15 1947) Weight:  [68 kg] 68 kg (08/16 0358)  Hemodynamic parameters for last 24 hours:    Intake/Output from previous day: 08/15 0701 - 08/16 0700 In: 694.9 [P.O.:480; I.V.:10; IV Piggyback:204.9] Out: -  Intake/Output this shift: No intake/output data recorded.  General appearance: alert, cooperative and no distress Heart: regular rate and rhythm and no murmur Lungs: min dim in bases Abdomen: benign Extremities: no calf tenderness Wound: incis healing well  Lab Results: No results for input(s): WBC, HGB, HCT, PLT in the last 72 hours. BMET:  Recent Labs    04/08/18 0500  NA 137  K 4.3  CL 103  CO2 27  GLUCOSE 93  BUN 14  CREATININE 1.14*  CALCIUM 8.5*    PT/INR: No results for input(s): LABPROT, INR in the last 72 hours. ABG    Component Value Date/Time   PHART 7.361 03/29/2018 1911   HCO3 24.2 03/29/2018 1911   TCO2 27 03/30/2018 1707   ACIDBASEDEF 1.0 03/29/2018 1911   O2SAT 100.0 03/29/2018 1911   CBG (last 3)  No results for input(s): GLUCAP in the last 72 hours.  Meds Scheduled Meds: . alteplase  2 mg Intracatheter Once  . aspirin EC  325 mg Oral Daily  . buprenorphine-naloxone  1 tablet Sublingual Daily  . busPIRone  15 mg Oral BID  . Chlorhexidine Gluconate Cloth  6 each Topical Daily  . docusate sodium  200 mg Oral Daily  . feeding supplement   1 Container Oral TID BM  . metoprolol tartrate  12.5 mg Oral BID  . nicotine  21 mg Transdermal Daily  . pantoprazole  40 mg Oral QAC breakfast  . sodium chloride flush  10-40 mL Intracatheter Q12H  . sulfamethoxazole-trimethoprim  1 tablet Oral Q12H   Continuous Infusions: . sodium chloride Stopped (03/31/18 2156)  . DAPTOmycin (CUBICIN)  IV Stopped (04/08/18 2255)   PRN Meds:.sodium chloride, acetaminophen, bisacodyl **OR** bisacodyl, ondansetron **OR** ondansetron (ZOFRAN) IV, oxyCODONE, phenol, sodium chloride flush, traMADol, traZODone  Xrays No results found.  Assessment/Plan: S/P Procedure(s) (LRB): TRICUSPID VALVE REPLACEMENT Using 27 mm Magna Ease Mitral Valve Pericardial Bioprosthesis (N/A) TRANSESOPHAGEAL ECHOCARDIOGRAM (TEE) (N/A)  1 doing well 2 hemodyn stable in sinus, tmax 99.6 3 conts current abx' 4 encouraged to stay out of bed as much as is feasible   LOS: 20 days    Rowe ClackWayne E Calvary Difranco 04/09/2018

## 2018-04-09 NOTE — Progress Notes (Signed)
Note some swelling to lower arm below midline.  Upper arm not.Midline flushes easily without difficulty.  Appears WNL.  Will continue to monitor.

## 2018-04-10 NOTE — Progress Notes (Addendum)
      [address]       [place] [id number]             [phone]      12 Days Post-Op Procedure(s) (LRB): TRICUSPID VALVE REPLACEMENT Using 27 mm Magna Ease Mitral Valve Pericardial Bioprosthesis (N/A) TRANSESOPHAGEAL ECHOCARDIOGRAM (TEE) (N/A) Subjective:  no new issues  Objective: Vital signs in last 24 hours: Temp:  [98.4 F (36.9 C)-99.6 F (37.6 C)] 99.6 F (37.6 C) (08/16 2216) Pulse Rate:  [93-106] 106 (08/16 2216) Cardiac Rhythm: Normal sinus rhythm (08/17 0700) Resp:  [16-25] 16 (08/16 2216) BP: (95-111)/(59-67) 111/67 (08/16 2216) SpO2:  [96 %-98 %] 96 % (08/16 2216) Weight:  [68.5 kg] 68.5 kg (08/17 0319)  Hemodynamic parameters for last 24 hours:    Intake/Output from previous day: 08/16 0701 - 08/17 0700 In: 122 [I.V.:10; IV Piggyback:112] Out: -  Intake/Output this shift: No intake/output data recorded.  General appearance: alert, cooperative and no distress Heart: regular rate and rhythm and no murmmur Lungs: clear to auscultation bilaterally Abdomen: benign exam Extremities: no edema Wound: incis healing well  Lab Results: No results for input(s): WBC, HGB, HCT, PLT in the last 72 hours. BMET:  Recent Labs    04/08/18 0500  NA 137  K 4.3  CL 103  CO2 27  GLUCOSE 93  BUN 14  CREATININE 1.14*  CALCIUM 8.5*    PT/INR: No results for input(s): LABPROT, INR in the last 72 hours. ABG    Component Value Date/Time   PHART 7.361 03/29/2018 1911   HCO3 24.2 03/29/2018 1911   TCO2 27 03/30/2018 1707   ACIDBASEDEF 1.0 03/29/2018 1911   O2SAT 100.0 03/29/2018 1911   CBG (last 3)  No results for input(s): GLUCAP in the last 72 hours.  Meds Scheduled Meds: . alteplase  2 mg Intracatheter Once  . aspirin EC  325 mg Oral Daily  . buprenorphine-naloxone  1 tablet Sublingual Daily  . busPIRone  15 mg Oral BID  . Chlorhexidine Gluconate Cloth  6 each Topical Daily  . docusate sodium  200 mg Oral Daily  . feeding  supplement  1 Container Oral TID BM  . fluconazole  150 mg Oral Q72H  . metoprolol tartrate  12.5 mg Oral BID  . nicotine  21 mg Transdermal Daily  . pantoprazole  40 mg Oral QAC breakfast  . sodium chloride flush  10-40 mL Intracatheter Q12H  . sulfamethoxazole-trimethoprim  1 tablet Oral Q12H   Continuous Infusions: . sodium chloride Stopped (03/31/18 2156)  . DAPTOmycin (CUBICIN)  IV Stopped (04/09/18 2246)   PRN Meds:.sodium chloride, acetaminophen, bisacodyl **OR** bisacodyl, ondansetron **OR** ondansetron (ZOFRAN) IV, oxyCODONE, phenol, sodium chloride flush, traMADol, traZODone  Xrays No results found.  Assessment/Plan: S/P Procedure(s) (LRB): TRICUSPID VALVE REPLACEMENT Using 27 mm Magna Ease Mitral Valve Pericardial Bioprosthesis (N/A) TRANSESOPHAGEAL ECHOCARDIOGRAM (TEE) (N/A)  1 hemodyn stable in sinus rhythm 2 tmax 99.6 3 no new labs 4 conts current abx per ID recs 5 routine rehab and pulm toilet 6 doing well with suboxone 7 unable to place, cont inpatient hospitalization  LOS: 21 days    Rowe ClackWayne E Gold 04/10/2018   I have seen and examined the patient and agree with the assessment and plan as outlined.  Purcell Nailslarence H Laterrian Hevener, MD 04/10/2018 12:02 PM

## 2018-04-10 NOTE — Progress Notes (Signed)
Received an IV Team consult from the RN caring for pt; consult was in regard to a question about changing the dressing on a picc line; the dressing change policy was reviewed by phone with Kizzie FurnishNego,  RN;  RN to monitor picc site and picc dressing;    Barkley BrunsLisa Charon Akamine RN  IV Team

## 2018-04-11 NOTE — Progress Notes (Addendum)
      [address]       [place] [id number]             [phone]      13 Days Post-Op Procedure(s) (LRB): TRICUSPID VALVE REPLACEMENT Using 27 mm Magna Ease Mitral Valve Pericardial Bioprosthesis (N/A) TRANSESOPHAGEAL ECHOCARDIOGRAM (TEE) (N/A) Subjective: No new c/o  Objective: Vital signs in last 24 hours: Temp:  [98.2 F (36.8 C)-98.3 F (36.8 C)] 98.2 F (36.8 C) (08/18 0551) Pulse Rate:  [75-99] 75 (08/18 0551) Cardiac Rhythm: Normal sinus rhythm (08/18 0700) Resp:  [25] 25 (08/17 2322) BP: (90-102)/(52-77) 90/52 (08/18 0551) SpO2:  [100 %] 100 % (08/18 0551) Weight:  [68.2 kg] 68.2 kg (08/18 0153)  Hemodynamic parameters for last 24 hours:    Intake/Output from previous day: 08/17 0701 - 08/18 0700 In: 112 [IV Piggyback:112] Out: -  Intake/Output this shift: No intake/output data recorded.  General appearance: alert, cooperative and no distress Heart: regular rate and rhythm and no murmur Lungs: dim right base Abdomen: benign Extremities: no edema Wound: incis healing well  Lab Results: No results for input(s): WBC, HGB, HCT, PLT in the last 72 hours. BMET: No results for input(s): NA, K, CL, CO2, GLUCOSE, BUN, CREATININE, CALCIUM in the last 72 hours.  PT/INR: No results for input(s): LABPROT, INR in the last 72 hours. ABG    Component Value Date/Time   PHART 7.361 03/29/2018 1911   HCO3 24.2 03/29/2018 1911   TCO2 27 03/30/2018 1707   ACIDBASEDEF 1.0 03/29/2018 1911   O2SAT 100.0 03/29/2018 1911   CBG (last 3)  No results for input(s): GLUCAP in the last 72 hours.  Meds Scheduled Meds: . alteplase  2 mg Intracatheter Once  . aspirin EC  325 mg Oral Daily  . buprenorphine-naloxone  1 tablet Sublingual Daily  . busPIRone  15 mg Oral BID  . Chlorhexidine Gluconate Cloth  6 each Topical Daily  . docusate sodium  200 mg Oral Daily  . feeding supplement  1 Container Oral TID BM  . fluconazole  150 mg Oral Q72H  . metoprolol  tartrate  12.5 mg Oral BID  . nicotine  21 mg Transdermal Daily  . pantoprazole  40 mg Oral QAC breakfast  . sodium chloride flush  10-40 mL Intracatheter Q12H  . sulfamethoxazole-trimethoprim  1 tablet Oral Q12H   Continuous Infusions: . sodium chloride Stopped (03/31/18 2156)  . DAPTOmycin (CUBICIN)  IV Stopped (04/11/18 0008)   PRN Meds:.sodium chloride, acetaminophen, bisacodyl **OR** bisacodyl, ondansetron **OR** ondansetron (ZOFRAN) IV, oxyCODONE, phenol, sodium chloride flush, traMADol, traZODone  Xrays No results found.  Assessment/Plan: S/P Procedure(s) (LRB): TRICUSPID VALVE REPLACEMENT Using 27 mm Magna Ease Mitral Valve Pericardial Bioprosthesis (N/A) TRANSESOPHAGEAL ECHOCARDIOGRAM (TEE) (N/A)  1 remains hemodyn stable , some sinus tachy but generally well controlled HR 2 no fevers 3 conts other current RX  LOS: 22 days    Rowe ClackWayne E Gold 04/11/2018 Pager [phone]  I have seen and examined the patient and agree with the assessment and plan as outlined.  Purcell Nailslarence H Owen, MD 04/11/2018 11:31 AM

## 2018-04-12 LAB — WET PREP, GENITAL
Clue Cells Wet Prep HPF POC: NONE SEEN
Sperm: NONE SEEN
Yeast Wet Prep HPF POC: NONE SEEN

## 2018-04-12 MED ORDER — METRONIDAZOLE 500 MG PO TABS
2000.0000 mg | ORAL_TABLET | Freq: Once | ORAL | Status: AC
  Administered 2018-04-12: 2000 mg via ORAL
  Filled 2018-04-12: qty 4

## 2018-04-12 NOTE — Progress Notes (Signed)
   Progress Note  Patient Name: Tiffany Salazar Date of Encounter: 04/12/2018  Primary Cardiologist: Dr Duke Salviaandolph  Subjective   Chest sore from surgery; no dyspnea  Inpatient Medications    Scheduled Meds: . alteplase  2 mg Intracatheter Once  . aspirin EC  325 mg Oral Daily  . buprenorphine-naloxone  1 tablet Sublingual Daily  . busPIRone  15 mg Oral BID  . Chlorhexidine Gluconate Cloth  6 each Topical Daily  . docusate sodium  200 mg Oral Daily  . feeding supplement  1 Container Oral TID BM  . fluconazole  150 mg Oral Q72H  . metoprolol tartrate  12.5 mg Oral BID  . nicotine  21 mg Transdermal Daily  . pantoprazole  40 mg Oral QAC breakfast  . sodium chloride flush  10-40 mL Intracatheter Q12H  . sulfamethoxazole-trimethoprim  1 tablet Oral Q12H   Continuous Infusions: . sodium chloride Stopped (03/31/18 2156)  . DAPTOmycin (CUBICIN)  IV Stopped (04/11/18 2340)   PRN Meds: sodium chloride, acetaminophen, bisacodyl **OR** bisacodyl, ondansetron **OR** ondansetron (ZOFRAN) IV, oxyCODONE, phenol, sodium chloride flush, traMADol, traZODone   Vital Signs    Vitals:   04/11/18 1206 04/11/18 2254 04/12/18 0504 04/12/18 0612  BP: 94/62 100/60  (!) 100/58  Pulse:  85  77  Resp:  18  16  Temp:  97.8 F (36.6 C)  98.3 F (36.8 C)  TempSrc:  Oral  Oral  SpO2:  100%  98%  Weight:   68.3 kg   Height:        Intake/Output Summary (Last 24 hours) at 04/12/2018 0854 Last data filed at 04/12/2018 0400 Gross per 24 hour  Intake 112 ml  Output -  Net 112 ml   Filed Weights   04/10/18 0319 04/11/18 0153 04/12/18 0504  Weight: 68.5 kg 68.2 kg 68.3 kg    Telemetry    Sinus- Personally Reviewed  Physical Exam   GEN: No acute distress.   Neck: No JVD Cardiac: RRR, no murmurs, rubs, or gallops.  Respiratory: Clear to auscultation bilaterally. GI: Soft, nontender, non-distended  MS: No edema Neuro:  Nonfocal  Psych: Normal affect   Labs    Chemistry Recent Labs    Lab 04/06/18 1800 04/08/18 0500  NA  --  137  K  --  4.3  CL  --  103  CO2  --  27  GLUCOSE  --  93  BUN  --  14  CREATININE  --  1.14*  CALCIUM  --  8.5*  ALT LAVTOP  --   GFRNONAA  --  >60  GFRAA  --  >60  ANIONGAP  --  7     Patient Profile     37 y.o. female with a history of IV drug abuse here with MRSA tricuspid valve endocarditis and septic pulmonary emboli. Now s/p TV replacement  Assessment & Plan    1 s/p TV replacement- continue antibiotics per ID; will need baseline echo 3 months following DC s/p TVR.   [facility] will sign off.   Medication Recommendations:  Antibiotics per ID Other recommendations (labs, testing, etc):  No additional testing recommended Follow up as an outpatient:  FU with Dr Duke Salviaandolph 4-6 weeks following DC.  For questions or updates, please contact [facility] Please consult [email or web address] for contact info under Cardiology/STEMI.      Signed, Olga MillersBrian Crenshaw, MD  04/12/2018, 8:54 AM

## 2018-04-12 NOTE — Progress Notes (Signed)
CARDIAC REHAB PHASE I   Offered to ambulate with pt. Pt crocheting in bed, stating she has been walking independently in hallways. Will continue to follow as low priority.  [id number] Reynold Boweneresa  Keisha Amer, RN BSN 04/12/2018 2:04 PM

## 2018-04-12 NOTE — Progress Notes (Signed)
      [address]       [place] [id number]             [phone]      14 Days Post-Op Procedure(s) (LRB): TRICUSPID VALVE REPLACEMENT Using 27 mm Magna Ease Mitral Valve Pericardial Bioprosthesis (N/A) TRANSESOPHAGEAL ECHOCARDIOGRAM (TEE) (N/A) Subjective: C/O vaginal discharge, different than yeast. She has had trichomonas but not sure if it's been properly treated  Objective: Vital signs in last 24 hours: Temp:  [97.8 F (36.6 C)-98.3 F (36.8 C)] 98.3 F (36.8 C) (08/19 0612) Pulse Rate:  [77-85] 77 (08/19 0612) Cardiac Rhythm: Normal sinus rhythm (08/19 0700) Resp:  [16-18] 17 (08/19 1200) BP: (100-101)/(58-65) 101/65 (08/19 1200) SpO2:  [98 %-100 %] 98 % (08/19 0612) Weight:  [68.3 kg] 68.3 kg (08/19 0504)  Hemodynamic parameters for last 24 hours:    Intake/Output from previous day: 08/18 0701 - 08/19 0700 In: 112 [IV Piggyback:112] Out: -  Intake/Output this shift: No intake/output data recorded.  General appearance: alert, cooperative, appears stated age and no distress Heart: regular rate and rhythm Lungs: mildly dim in bases Abdomen: benign Extremities: no edema or calf tenderness Wound: incis healing well  Lab Results: No results for input(s): WBC, HGB, HCT, PLT in the last 72 hours. BMET: No results for input(s): NA, K, CL, CO2, GLUCOSE, BUN, CREATININE, CALCIUM in the last 72 hours.  PT/INR: No results for input(s): LABPROT, INR in the last 72 hours. ABG    Component Value Date/Time   PHART 7.361 03/29/2018 1911   HCO3 24.2 03/29/2018 1911   TCO2 27 03/30/2018 1707   ACIDBASEDEF 1.0 03/29/2018 1911   O2SAT 100.0 03/29/2018 1911   CBG (last 3)  No results for input(s): GLUCAP in the last 72 hours.  Meds Scheduled Meds: . alteplase  2 mg Intracatheter Once  . aspirin EC  325 mg Oral Daily  . buprenorphine-naloxone  1 tablet Sublingual Daily  . busPIRone  15 mg Oral BID  . Chlorhexidine Gluconate Cloth  6 each Topical  Daily  . docusate sodium  200 mg Oral Daily  . feeding supplement  1 Container Oral TID BM  . metoprolol tartrate  12.5 mg Oral BID  . nicotine  21 mg Transdermal Daily  . pantoprazole  40 mg Oral QAC breakfast  . sodium chloride flush  10-40 mL Intracatheter Q12H  . sulfamethoxazole-trimethoprim  1 tablet Oral Q12H   Continuous Infusions: . sodium chloride Stopped (03/31/18 2156)  . DAPTOmycin (CUBICIN)  IV Stopped (04/11/18 2340)   PRN Meds:.sodium chloride, acetaminophen, bisacodyl **OR** bisacodyl, ondansetron **OR** ondansetron (ZOFRAN) IV, oxyCODONE, phenol, sodium chloride flush, traMADol, traZODone  Xrays No results found.  Assessment/Plan: S/P Procedure(s) (LRB): TRICUSPID VALVE REPLACEMENT Using 27 mm Magna Ease Mitral Valve Pericardial Bioprosthesis (N/A) TRANSESOPHAGEAL ECHOCARDIOGRAM (TEE) (N/A)  1 stable overall 2 will give a single 2 gram  dose of flagyl. If ineffective will need to have gyn evaluate    LOS: 23 days    Rowe ClackWayne E Jeri Jeanbaptiste 04/12/2018 Pager [phone]

## 2018-04-12 NOTE — Plan of Care (Signed)
  Problem: Health Behavior/Discharge Planning: Goal: Ability to manage health-related needs will improve Outcome: Progressing   Problem: Clinical Measurements: Goal: Will remain free from infection Outcome: Progressing Goal: Respiratory complications will improve Outcome: Progressing   Problem: Coping: Goal: Level of anxiety will decrease Outcome: Progressing   Problem: Education: Goal: Will demonstrate proper wound care and an understanding of methods to prevent future damage Outcome: Progressing

## 2018-04-13 NOTE — Progress Notes (Addendum)
      [address]       [place] [id number]             [phone]      15 Days Post-Op Procedure(s) (LRB): TRICUSPID VALVE REPLACEMENT Using 27 mm Magna Ease Mitral Valve Pericardial Bioprosthesis (N/A) TRANSESOPHAGEAL ECHOCARDIOGRAM (TEE) (N/A) Subjective: Trich positive on wet prep- discharge is already improved after flagyl and she feels better  Objective: Vital signs in last 24 hours: Temp:  [98.2 F (36.8 C)-100.1 F (37.8 C)] 98.2 F (36.8 C) (08/20 0534) Pulse Rate:  [73-95] 73 (08/20 0534) Cardiac Rhythm: Normal sinus rhythm (08/20 0700) Resp:  [17-27] 27 (08/20 0534) BP: (99-121)/(55-66) 99/55 (08/20 0534) SpO2:  [99 %] 99 % (08/20 0534) Weight:  [68.8 kg] 68.8 kg (08/20 0534)  Hemodynamic parameters for last 24 hours:    Intake/Output from previous day: 08/19 0701 - 08/20 0700 In: 188.1 [I.V.:76.1; IV Piggyback:112] Out: -  Intake/Output this shift: No intake/output data recorded.  General appearance: alert, cooperative and no distress Heart: regular rate and rhythm Lungs: clear to auscultation bilaterally Abdomen: benign Extremities: no edema Wound: incis healing well  Lab Results: No results for input(s): WBC, HGB, HCT, PLT in the last 72 hours. BMET: No results for input(s): NA, K, CL, CO2, GLUCOSE, BUN, CREATININE, CALCIUM in the last 72 hours.  PT/INR: No results for input(s): LABPROT, INR in the last 72 hours. ABG    Component Value Date/Time   PHART 7.361 03/29/2018 1911   HCO3 24.2 03/29/2018 1911   TCO2 27 03/30/2018 1707   ACIDBASEDEF 1.0 03/29/2018 1911   O2SAT 100.0 03/29/2018 1911   CBG (last 3)  No results for input(s): GLUCAP in the last 72 hours.  Meds Scheduled Meds: . alteplase  2 mg Intracatheter Once  . aspirin EC  325 mg Oral Daily  . buprenorphine-naloxone  1 tablet Sublingual Daily  . busPIRone  15 mg Oral BID  . Chlorhexidine Gluconate Cloth  6 each Topical Daily  . docusate sodium  200 mg Oral  Daily  . feeding supplement  1 Container Oral TID BM  . metoprolol tartrate  12.5 mg Oral BID  . nicotine  21 mg Transdermal Daily  . pantoprazole  40 mg Oral QAC breakfast  . sodium chloride flush  10-40 mL Intracatheter Q12H  . sulfamethoxazole-trimethoprim  1 tablet Oral Q12H   Continuous Infusions: . sodium chloride Stopped (03/31/18 2156)  . DAPTOmycin (CUBICIN)  IV 600 mg (04/12/18 2157)   PRN Meds:.sodium chloride, acetaminophen, bisacodyl **OR** bisacodyl, ondansetron **OR** ondansetron (ZOFRAN) IV, oxyCODONE, phenol, sodium chloride flush, traMADol, traZODone  Xrays No results found.  Assessment/Plan: S/P Procedure(s) (LRB): TRICUSPID VALVE REPLACEMENT Using 27 mm Magna Ease Mitral Valve Pericardial Bioprosthesis (N/A) TRANSESOPHAGEAL ECHOCARDIOGRAM (TEE) (N/A)   1 doing well overall, tmax 100.1, hemodyn stable 2 conts current management    LOS: 24 days    Rowe ClackWayne E Gold 04/13/2018 Pager [phone]   Chart reviewed, patient examined, agree with above. She feels well and is ambulating, eating well. Low grade temp to 100.1 last pm and will need to follow closely because PICC line is old. Will repeat WBC ct in am.

## 2018-04-13 NOTE — Progress Notes (Signed)
Patient reported that she walked 30 minutes in the hall today, will monitor.

## 2018-04-13 NOTE — Progress Notes (Addendum)
Nutrition Follow-up  DOCUMENTATION CODES:   Not applicable  INTERVENTION:    Continue Regular diet   D/C Boost Breeze  NEW NUTRITION DIAGNOSIS:   Increased nutrient needs related to acute illness as evidenced by estimated needs, ongoing  GOAL:   Patient will meet greater than or equal to 90% of their needs, met  MONITOR:   PO intake, Supplement acceptance, Weight trends, Labs  ASSESSMENT:   37 y.o. female with a history of female with a history of IVDU, polysubstance abuse. She presented from a [place] hospital for which she was admitted for the treatment of TV endocarditis and MRSA bacteremia with associated septic pulmonary emboli. Patient left AMA to return to [facility] for continued treatment.   Pt s/p procedure 8/5: TRICUSPID VALVE REPLACEMENT  Pt currently on a Regular diet. Appetite good. PO intake now consistently at 100% per flowsheets. Not taking Boost Breeze nutrition supplements. She is finishing her IV ABX at hospital.  Diet Order:   Diet Order            Diet regular Room service appropriate? Yes; Fluid consistency: Thin  Diet effective now             EDUCATION NEEDS:   No education needs have been identified at this time  Skin:  Skin Assessment: Reviewed RN Assessment  Last BM:  8/18  Height:   Ht Readings from Last 1 Encounters:  03/23/18 _0  (1.702 m)   Weight:   Wt Readings from Last 1 Encounters:  04/13/18 68.8 kg   Ideal Body Weight:  61.36 kg  BMI:  Body mass index is 23.76 kg/m.  Estimated Nutritional Needs:   Kcal:  1700-1900  Protein:  75-90 gm  Fluid:  1.7-1.9 L  Arthur Holms, RD, LDN Pager #: [phone] After-Hours Pager #: [phone]

## 2018-04-14 LAB — CBC
HCT: 27.4 % — ABNORMAL LOW (ref 36.0–46.0)
Hemoglobin: 8.2 g/dL — ABNORMAL LOW (ref 12.0–15.0)
MCH: 30.4 pg (ref 26.0–34.0)
MCHC: 29.9 g/dL — ABNORMAL LOW (ref 30.0–36.0)
MCV: 101.5 fL — ABNORMAL HIGH (ref 78.0–100.0)
Platelets: 356 10*3/uL (ref 150–400)
RBC: 2.7 MIL/uL — ABNORMAL LOW (ref 3.87–5.11)
RDW: 15.6 % — ABNORMAL HIGH (ref 11.5–15.5)
WBC: 7.3 10*3/uL (ref 4.0–10.5)

## 2018-04-14 NOTE — Progress Notes (Addendum)
      [address]       [place] [id number]             [phone]      16 Days Post-Op Procedure(s) (LRB): TRICUSPID VALVE REPLACEMENT Using 27 mm Magna Ease Mitral Valve Pericardial Bioprosthesis (N/A) TRANSESOPHAGEAL ECHOCARDIOGRAM (TEE) (N/A) Subjective: Shares that she is having a lot of neck pain this morning. The patient believes it stems from where her central line was previously placed.   Objective: Vital signs in last 24 hours: Temp:  [98 F (36.7 C)-98.8 F (37.1 C)] 98 F (36.7 C) (08/21 0850) Pulse Rate:  [70-77] 70 (08/21 0850) Cardiac Rhythm: Normal sinus rhythm (08/21 0700) Resp:  [16-31] 16 (08/21 0850) BP: (93-104)/(56-61) 99/56 (08/21 0850) SpO2:  [98 %-100 %] 98 % (08/21 0850) Weight:  [68.5 kg] 68.5 kg (08/21 0415)     Intake/Output from previous day: 08/20 0701 - 08/21 0700 In: 1554 [P.O.:1320; I.V.:10; IV Piggyback:224] Out: 5 [Urine:4; Stool:1] Intake/Output this shift: Total I/O In: 20 [I.V.:20] Out: -   General appearance: alert, cooperative and no distress Heart: regular rate and rhythm, S1, S2 normal, no murmur, click, rub or gallop Lungs: clear to auscultation bilaterally Abdomen: soft, non-tender; bowel sounds normal; no masses,  no organomegaly Extremities: extremities normal, atraumatic, no cyanosis or edema Wound: clean and dry  Lab Results: Recent Labs    04/14/18 0417  WBC 7.3  HGB 8.2*  HCT 27.4*  PLT 356   BMET: No results for input(s): NA, K, CL, CO2, GLUCOSE, BUN, CREATININE, CALCIUM in the last 72 hours.  PT/INR: No results for input(s): LABPROT, INR in the last 72 hours. ABG    Component Value Date/Time   PHART 7.361 03/29/2018 1911   HCO3 24.2 03/29/2018 1911   TCO2 27 03/30/2018 1707   ACIDBASEDEF 1.0 03/29/2018 1911   O2SAT 100.0 03/29/2018 1911   CBG (last 3)  No results for input(s): GLUCAP in the last 72 hours.  Assessment/Plan: S/P Procedure(s) (LRB): TRICUSPID VALVE REPLACEMENT  Using 27 mm Magna Ease Mitral Valve Pericardial Bioprosthesis (N/A) TRANSESOPHAGEAL ECHOCARDIOGRAM (TEE) (N/A)  1. No recent fevers. WBC trending down-now 7.3 2. H and H stable at 8.2/27.4, platelets 356k 3. Vaginal discharge-Trich positive on wet prep 4. Continue Cubicin and Bactrim  Plan: Work on neck pain with heat/ice/pain medication. Continue antibiotics. Ambulate in the halls TID.    LOS: 25 days    Sharlene Doryessa N Conte 04/14/2018   Chart reviewed, patient examined, agree with above. She is stable. Afebrile and normal WBC ct. Continue antibiotics.

## 2018-04-14 NOTE — Plan of Care (Signed)
  Problem: Health Behavior/Discharge Planning: Goal: Ability to manage health-related needs will improve Outcome: Progressing   Problem: Clinical Measurements: Goal: Respiratory complications will improve Outcome: Progressing   Problem: Education: Goal: Will demonstrate proper wound care and an understanding of methods to prevent future damage Outcome: Progressing

## 2018-04-14 NOTE — Progress Notes (Signed)
Pt c/o severe neck pain. In bed, has hot pack on. Declined walking now. Encouraged her to walk when her pain is decreased. Will f/u as time allows. [id number] Ethelda ChickKristan Mico Spark CES, ACSM 2:22 PM 04/14/2018

## 2018-04-14 NOTE — Care Management Note (Signed)
Case Management Note  Patient Details  Name: Tiffany Salazar MRN: [id number] Date of Birth: 06/20/1981  Subjective/Objective:            Endocarditis with IVDU hx and inpatient IV ABX through 9/19        Action/Plan:  Spoke w patient. She states that she is homeless but her boyfriend is establishing residence for them this week [place] an apartment in [place].  She states that she has not been to listed PCP in 3 years.  She states that she uses [facility] on [address] and gets help from friends to pay for meds as needed.  She is concerned about getting Medicaid. She feels she should be able to qualify for disability. Could not reach [employer]. She has 3 children that do not stay with her, she has given them over to her grandmother.   Needs assistance with Medicaid application  Expected Discharge Date:                  Expected Discharge Plan:  Home/Self Care  In-House Referral:  Clinical Social Work  Discharge planning Services  CM Consult  Post Acute Care Choice:    Choice offered to:     DME Arranged:    DME Agency:     HH Arranged:    HH Agency:     Status of Service:     If discussed at [employer] Meetings, dates discussed:    Additional Comments:  Tiffany SabalDebbie Antowan Samford, RN 04/14/2018, 4:33 PM

## 2018-04-15 LAB — BASIC METABOLIC PANEL
Anion gap: 4 — ABNORMAL LOW (ref 5–15)
BUN: 9 mg/dL (ref 6–20)
CO2: 26 mmol/L (ref 22–32)
Calcium: 8.4 mg/dL — ABNORMAL LOW (ref 8.9–10.3)
Chloride: 106 mmol/L (ref 98–111)
Creatinine, Ser: 0.93 mg/dL (ref 0.44–1.00)
GFR calc Af Amer: >60 mL/min (ref >60)
GFR calc non Af Amer: >60 mL/min (ref >60)
Glucose, Bld: 123 mg/dL — ABNORMAL HIGH (ref 70–99)
Potassium: 4 mmol/L (ref 3.5–5.1)
Sodium: 136 mmol/L (ref 135–145)

## 2018-04-15 LAB — CK: Total CK: 28 U/L — ABNORMAL LOW (ref 38–234)

## 2018-04-15 NOTE — Plan of Care (Signed)
?  Problem: Health Behavior/Discharge Planning: ?Goal: Ability to manage health-related needs will improve ?Outcome: Progressing ?  ?Problem: Clinical Measurements: ?Goal: Will remain free from infection ?Outcome: Progressing ?Goal: Respiratory complications will improve ?Outcome: Progressing ?  ?

## 2018-04-15 NOTE — Progress Notes (Addendum)
      [address]       [place] [id number]             [phone]      17 Days Post-Op Procedure(s) (LRB): TRICUSPID VALVE REPLACEMENT Using 27 mm Magna Ease Mitral Valve Pericardial Bioprosthesis (N/A) TRANSESOPHAGEAL ECHOCARDIOGRAM (TEE) (N/A) Subjective: Neck stiffness/discomfort slowly improving  Objective: Vital signs in last 24 hours: Temp:  [98 F (36.7 C)-98.6 F (37 C)] 98.6 F (37 C) (08/22 0344) Pulse Rate:  [70-78] 78 (08/22 0344) Cardiac Rhythm: Normal sinus rhythm (08/21 1902) Resp:  [15-18] 15 (08/22 0344) BP: (83-110)/(42-62) 83/42 (08/22 0344) SpO2:  [96 %-98 %] 96 % (08/22 0344) Weight:  [69.8 kg] 69.8 kg (08/22 0444)  Hemodynamic parameters for last 24 hours:    Intake/Output from previous day: 08/21 0701 - 08/22 0700 In: 1340 [P.O.:1320; I.V.:20] Out: -  Intake/Output this shift: No intake/output data recorded.  General appearance: alert, cooperative and no distress Heart: regular rate and rhythm and no mutmur Lungs: milfly dim in bases Abdomen: benign Extremities: no edema or calf tenderness Wound: incis healing well  Lab Results: Recent Labs    04/14/18 0417  WBC 7.3  HGB 8.2*  HCT 27.4*  PLT 356   BMET: No results for input(s): NA, K, CL, CO2, GLUCOSE, BUN, CREATININE, CALCIUM in the last 72 hours.  PT/INR: No results for input(s): LABPROT, INR in the last 72 hours. ABG    Component Value Date/Time   PHART 7.361 03/29/2018 1911   HCO3 24.2 03/29/2018 1911   TCO2 27 03/30/2018 1707   ACIDBASEDEF 1.0 03/29/2018 1911   O2SAT 100.0 03/29/2018 1911   CBG (last 3)  No results for input(s): GLUCAP in the last 72 hours.  Meds Scheduled Meds: . alteplase  2 mg Intracatheter Once  . aspirin EC  325 mg Oral Daily  . buprenorphine-naloxone  1 tablet Sublingual Daily  . busPIRone  15 mg Oral BID  . Chlorhexidine Gluconate Cloth  6 each Topical Daily  . docusate sodium  200 mg Oral Daily  . metoprolol tartrate   12.5 mg Oral BID  . nicotine  21 mg Transdermal Daily  . pantoprazole  40 mg Oral QAC breakfast  . sodium chloride flush  10-40 mL Intracatheter Q12H  . sulfamethoxazole-trimethoprim  1 tablet Oral Q12H   Continuous Infusions: . sodium chloride Stopped (03/31/18 2156)  . DAPTOmycin (CUBICIN)  IV 600 mg (04/14/18 2228)   PRN Meds:.sodium chloride, acetaminophen, bisacodyl **OR** bisacodyl, ondansetron **OR** ondansetron (ZOFRAN) IV, oxyCODONE, phenol, sodium chloride flush, traMADol, traZODone  Xrays No results found.  Assessment/Plan: S/P Procedure(s) (LRB): TRICUSPID VALVE REPLACEMENT Using 27 mm Magna Ease Mitral Valve Pericardial Bioprosthesis (N/A) TRANSESOPHAGEAL ECHOCARDIOGRAM (TEE) (N/A)  1 conts to be quite stable overall on current RX/TX   LOS: 26 days    Rowe ClackWayne E Gold 04/15/2018 Pager [phone]  Agree with above.

## 2018-04-15 NOTE — Progress Notes (Signed)
Pharmacy Antibiotic Note  Tiffany Salazar [name] is a 37 y.o. female admitted on 03/20/2018 with TV endocarditis.  Pharmacy has been consulted for daptomycin dosing x 6 weeks. [place] was [place] in [place] from 7/17 to 7-26 for tricuspid endocarditis, MRSA bacteremia and pulmonary septic emboli. On 7/14 she developed a rash that was thought to be caused by Vancomycin and was changed to Daptomycin at that time. She left AMA on 7/26 to [facility]. All cultures negative inpatient. S/P valve replacement 8/5.   Patient is also receiving Bactrim for pulmonary cavitary lesions - continue x 4 weeks per ID.   Weekly CK:  8/8 = 34                       8/15 = 25                       8/22 = 28   Scr had trended up to 1.14 on 8/15, back to 0.93 today. BUN 14>>9.  Plan: Continue Daptomycin 600mg  (~8-9 mg/kg) IV Q 24 hours BMET/CK every Thursday Per ID - Daptomycing 6 wks post-op (through 9/16) and Septra DS for 4 wks (ordered through 8/26 per ID) Stop dates are in place.  Height: 5\' 7"  (170.2 cm) Weight: 153 lb 14.4 oz (69.8 kg) IBW/kg (Calculated) : 61.6  Temp (24hrs), Avg:98.3 F (36.8 C), Min:98.1 F (36.7 C), Max:98.6 F (37 C)  Recent Labs  Lab 04/14/18 0417 04/15/18 0500  WBC 7.3  --   CREATININE  --  0.93    Estimated Creatinine Clearance: 82.1 mL/min (by C-G formula based on SCr of 0.93 mg/dL).    Allergies  Allergen Reactions  . Gentamicin Rash  . Vancomycin Rash    Antimicrobials this admission: Daptomycin (keep >8mg /kg) 7/26 >> (9/16) Bactrim 7/28 (for septic PE)>>(8/14 updated per 8/9 note) Cefuroxime 8/5>>8/7 Metronidazole 2g x 1 for Trichomonas on 8/8 and 8/19 Fluconazole X 2 doses for yeast infection 8/16>>8/18  Microbiology results: 7/28 Blood x 2 - negative 7/29 MRSA PCR negative 8/4 MRSA PCR: negative for MRSA, positive for Staph aureus 8/5 Tissue valve: negative 8/5 tissue valve fungal culture - pending 8/19 wet prep + for Trichomonas  Dennie Fettersgan,  Ia Leeb Donovan, RPh Pager: [phone] or phone: [phone] 04/15/2018 2:23 PM

## 2018-04-16 NOTE — Plan of Care (Signed)
?  Problem: Health Behavior/Discharge Planning: ?Goal: Ability to manage health-related needs will improve ?Outcome: Progressing ?  ?Problem: Clinical Measurements: ?Goal: Will remain free from infection ?Outcome: Progressing ?Goal: Respiratory complications will improve ?Outcome: Progressing ?  ?

## 2018-04-16 NOTE — Progress Notes (Addendum)
      [address]       [place] [id number]             [phone]      18 Days Post-Op Procedure(s) (LRB): TRICUSPID VALVE REPLACEMENT Using 27 mm Magna Ease Mitral Valve Pericardial Bioprosthesis (N/A) TRANSESOPHAGEAL ECHOCARDIOGRAM (TEE) (N/A) Subjective: Neck feels a bit better  Objective: Vital signs in last 24 hours: Temp:  [98.2 F (36.8 C)-98.4 F (36.9 C)] 98.3 F (36.8 C) (08/23 0706) Pulse Rate:  [75] 75 (08/23 0706) Cardiac Rhythm: Normal sinus rhythm (08/23 0700) Resp:  [13-20] 13 (08/23 0706) BP: (91-105)/(56-69) 103/61 (08/23 0706) SpO2:  [95 %-100 %] 100 % (08/23 0706) Weight:  [70.6 kg] 70.6 kg (08/23 0706)  Hemodynamic parameters for last 24 hours:    Intake/Output from previous day: 08/22 0701 - 08/23 0700 In: 236 [P.O.:236] Out: -  Intake/Output this shift: No intake/output data recorded.  General appearance: alert, cooperative and no distress Heart: regular rate and rhythm Lungs: clear Abdomen: benign Extremities: no edema or calf tenderness Incisions: healing well   Lab Results: Recent Labs    04/14/18 0417  WBC 7.3  HGB 8.2*  HCT 27.4*  PLT 356   BMET:  Recent Labs    04/15/18 0500  NA 136  K 4.0  CL 106  CO2 26  GLUCOSE 123*  BUN 9  CREATININE 0.93  CALCIUM 8.4*    PT/INR: No results for input(s): LABPROT, INR in the last 72 hours. ABG    Component Value Date/Time   PHART 7.361 03/29/2018 1911   HCO3 24.2 03/29/2018 1911   TCO2 27 03/30/2018 1707   ACIDBASEDEF 1.0 03/29/2018 1911   O2SAT 100.0 03/29/2018 1911   CBG (last 3)  No results for input(s): GLUCAP in the last 72 hours.  Meds Scheduled Meds: . aspirin EC  325 mg Oral Daily  . buprenorphine-naloxone  1 tablet Sublingual Daily  . busPIRone  15 mg Oral BID  . Chlorhexidine Gluconate Cloth  6 each Topical Daily  . docusate sodium  200 mg Oral Daily  . metoprolol tartrate  12.5 mg Oral BID  . nicotine  21 mg Transdermal Daily  .  pantoprazole  40 mg Oral QAC breakfast  . sodium chloride flush  10-40 mL Intracatheter Q12H  . sulfamethoxazole-trimethoprim  1 tablet Oral Q12H   Continuous Infusions: . sodium chloride Stopped (03/31/18 2156)  . DAPTOmycin (CUBICIN)  IV 600 mg (04/15/18 2153)   PRN Meds:.sodium chloride, acetaminophen, bisacodyl **OR** bisacodyl, ondansetron **OR** ondansetron (ZOFRAN) IV, oxyCODONE, phenol, sodium chloride flush, traMADol, traZODone  Xrays No results found.  Assessment/Plan: S/P Procedure(s) (LRB): TRICUSPID VALVE REPLACEMENT Using 27 mm Magna Ease Mitral Valve Pericardial Bioprosthesis (N/A) TRANSESOPHAGEAL ECHOCARDIOGRAM (TEE) (N/A)   1 remains hemodyn stable in sinus rhythm 2 sats good on RA 3 no change to current Tx/Rx   LOS: 27 days    Rowe ClackWayne E Gold 04/16/2018 Pager [phone]   Chart reviewed, patient examined, agree with above. She says she feels well overall. Some right neck pain but it seems to be improving and sounds muscular.

## 2018-04-17 LAB — WET PREP, GENITAL
Sperm: NONE SEEN
Yeast Wet Prep HPF POC: NONE SEEN

## 2018-04-17 LAB — HCV RNA QUANT RFLX ULTRA OR GENOTYP
HCV RNA Qnt(log copy/mL): 2.176 log10 IU/mL
HepC Qn: 150 IU/mL

## 2018-04-17 NOTE — Progress Notes (Signed)
      [address]       [place] [id number]             [phone]      19 Days Post-Op Procedure(s) (LRB): TRICUSPID VALVE REPLACEMENT Using 27 mm Magna Ease Mitral Valve Pericardial Bioprosthesis (N/A) TRANSESOPHAGEAL ECHOCARDIOGRAM (TEE) (N/A)   Subjective:  No new complaints.  Attempting to sleep  Objective: Vital signs in last 24 hours: Temp:  [98.3 F (36.8 C)-98.5 F (36.9 C)] 98.5 F (36.9 C) (08/24 0658) Pulse Rate:  [70-82] 70 (08/24 0658) Cardiac Rhythm: Normal sinus rhythm (08/23 1901) Resp:  [20-28] 28 (08/24 0658) BP: (91-103)/(52-73) 91/52 (08/24 0658) SpO2:  [100 %] 100 % (08/24 0658) Weight:  [70.8 kg] 70.8 kg (08/24 0658)  Intake/Output from previous day: 08/23 0701 - 08/24 0700 In: -  Out: 1 [Stool:1]  General appearance: alert, cooperative and no distress Heart: regular rate and rhythm Lungs: clear to auscultation bilaterally Abdomen: soft, non-tender; bowel sounds normal; no masses,  no organomegaly Extremities: extremities normal, atraumatic, no cyanosis or edema Wound: clean and dry  Lab Results: No results for input(s): WBC, HGB, HCT, PLT in the last 72 hours. BMET:  Recent Labs    04/15/18 0500  NA 136  K 4.0  CL 106  CO2 26  GLUCOSE 123*  BUN 9  CREATININE 0.93  CALCIUM 8.4*    PT/INR: No results for input(s): LABPROT, INR in the last 72 hours. ABG    Component Value Date/Time   PHART 7.361 03/29/2018 1911   HCO3 24.2 03/29/2018 1911   TCO2 27 03/30/2018 1707   ACIDBASEDEF 1.0 03/29/2018 1911   O2SAT 100.0 03/29/2018 1911   CBG (last 3)  No results for input(s): GLUCAP in the last 72 hours.  Assessment/Plan: S/P Procedure(s) (LRB): TRICUSPID VALVE REPLACEMENT Using 27 mm Magna Ease Mitral Valve Pericardial Bioprosthesis (N/A) TRANSESOPHAGEAL ECHOCARDIOGRAM (TEE) (N/A)  1. CV- remains hemodynamically stable 2. Pulm- no acute issues, off oxygen, continue IS 3. Drug Addiction- currently on Suboxone,  tramadol, and oxy.. May benefit from weaning oxy and tramadol 4. Endocarditis- remains afebrile, on ABX per ID 5. Dispo- patient stable, no placement can be arranged, will need to complete course of IV ABX in hospital   LOS: 28 days    Montague Corella 04/17/2018

## 2018-04-18 LAB — HEPATITIS C GENOTYPE

## 2018-04-18 MED ORDER — METRONIDAZOLE 500 MG PO TABS
500.0000 mg | ORAL_TABLET | Freq: Two times a day (BID) | ORAL | Status: AC
  Administered 2018-04-18 – 2018-04-24 (×14): 500 mg via ORAL
  Filled 2018-04-18 (×14): qty 1

## 2018-04-18 NOTE — Progress Notes (Addendum)
      [address]       [place] [id number]             [phone]      20 Days Post-Op Procedure(s) (LRB): TRICUSPID VALVE REPLACEMENT Using 27 mm Magna Ease Mitral Valve Pericardial Bioprosthesis (N/A) TRANSESOPHAGEAL ECHOCARDIOGRAM (TEE) (N/A)   Subjective:  Contacted by nursing yesterday in regards to patient again experiencing vaginal discharge.  The patient was requesting diflucan for treatment. However spoke to patient on phone and symptoms did not seem like yeast infection.  No new complaints  Objective: Vital signs in last 24 hours: Temp:  [98.3 F (36.8 C)-98.6 F (37 C)] 98.3 F (36.8 C) (08/25 0458) Pulse Rate:  [62-89] 62 (08/25 0458) Cardiac Rhythm: Normal sinus rhythm (08/25 0701) Resp:  [16] 16 (08/24 2022) BP: (95-110)/(57-61) 95/57 (08/25 0458) SpO2:  [98 %-100 %] 100 % (08/24 2022) Weight:  [72.6 kg] 72.6 kg (08/25 0454)  Intake/Output from previous day: 08/24 0701 - 08/25 0700 In: 1080 [P.O.:1080] Out: -   General appearance: alert, cooperative and no distress Heart: regular rate and rhythm Lungs: clear to auscultation bilaterally Abdomen: soft, non-tender; bowel sounds normal; no masses,  no organomegaly Extremities: extremities normal, atraumatic, no cyanosis or edema Wound: clean and dry  Lab Results: No results for input(s): WBC, HGB, HCT, PLT in the last 72 hours. BMET: No results for input(s): NA, K, CL, CO2, GLUCOSE, BUN, CREATININE, CALCIUM in the last 72 hours.  PT/INR: No results for input(s): LABPROT, INR in the last 72 hours. ABG    Component Value Date/Time   PHART 7.361 03/29/2018 1911   HCO3 24.2 03/29/2018 1911   TCO2 27 03/30/2018 1707   ACIDBASEDEF 1.0 03/29/2018 1911   O2SAT 100.0 03/29/2018 1911   CBG (last 3)  No results for input(s): GLUCAP in the last 72 hours.  Assessment/Plan: S/P Procedure(s) (LRB): TRICUSPID VALVE REPLACEMENT Using 27 mm Magna Ease Mitral Valve Pericardial Bioprosthesis  (N/A) TRANSESOPHAGEAL ECHOCARDIOGRAM (TEE) (N/A)  1. CV- remains hemodynamically stable 2. Pulm- no acute issues, off oxygen, continue IS 3. Substance abuse- on Subaxone, has not used Oxy since 8/22 will discontinue, continue Ultram for now, can hopefully wean off prior to discharge 4. GU- patient with complaints of vaginal discharge, repeat Wet Prep again confirms Trichomonas.. Has received 2 separate courses of 2g Flagyl orally on 8/8 and 8/19.. Will treat with 500 mg BID for a 7 days to see if we can get cleared up.. She denies further sexual intercourse in hospital 5. ID- endocarditis, continue Daptomycin, Bactrim for 6 weeks treatment course 6. Dispo- patient stable, new complaints of vaginal discharge, wet prep again shows trichomonas, will treat with a 1 week course of flagyl at this time, has been treated twice already with 2 g treatment, if continues to be issue, would benefit from gynecology consult   LOS: 29 days    Tiffany Salazar 04/18/2018 Patient seen and examined, agree with above  Viviann SpareSteven C. Dorris FetchHendrickson, MD [facility] [phone]

## 2018-04-19 LAB — HEPATITIS C GENOTYPE

## 2018-04-19 NOTE — Progress Notes (Signed)
      [address]       [place] [id number]             [phone]      21 Days Post-Op Procedure(s) (LRB): TRICUSPID VALVE REPLACEMENT Using 27 mm Magna Ease Mitral Valve Pericardial Bioprosthesis (N/A) TRANSESOPHAGEAL ECHOCARDIOGRAM (TEE) (N/A) Subjective: Feels pretty well, no new c/o  Objective: Vital signs in last 24 hours: Temp:  [98.2 F (36.8 C)-98.8 F (37.1 C)] 98.2 F (36.8 C) (08/26 0634) Pulse Rate:  [61-68] 62 (08/26 0634) Cardiac Rhythm: Normal sinus rhythm (08/25 2007) Resp:  [13-18] 13 (08/26 0634) BP: (99-105)/(53-58) 100/53 (08/26 0634) SpO2:  [97 %-99 %] 97 % (08/26 0634) Weight:  [71.4 kg] 71.4 kg (08/26 0500)  Hemodynamic parameters for last 24 hours:    Intake/Output from previous day: 08/25 0701 - 08/26 0700 In: 480 [P.O.:480] Out: -  Intake/Output this shift: No intake/output data recorded.  General appearance: alert, cooperative and no distress Heart: regular rate and rhythm Lungs: clear to auscultation bilaterally Abdomen: benign Extremities: no calf tenderness Wound: incis healing well  Lab Results: No results for input(s): WBC, HGB, HCT, PLT in the last 72 hours. BMET: No results for input(s): NA, K, CL, CO2, GLUCOSE, BUN, CREATININE, CALCIUM in the last 72 hours.  PT/INR: No results for input(s): LABPROT, INR in the last 72 hours. ABG    Component Value Date/Time   PHART 7.361 03/29/2018 1911   HCO3 24.2 03/29/2018 1911   TCO2 27 03/30/2018 1707   ACIDBASEDEF 1.0 03/29/2018 1911   O2SAT 100.0 03/29/2018 1911   CBG (last 3)  No results for input(s): GLUCAP in the last 72 hours.  Meds Scheduled Meds: . aspirin EC  325 mg Oral Daily  . buprenorphine-naloxone  1 tablet Sublingual Daily  . busPIRone  15 mg Oral BID  . Chlorhexidine Gluconate Cloth  6 each Topical Daily  . docusate sodium  200 mg Oral Daily  . metoprolol tartrate  12.5 mg Oral BID  . metroNIDAZOLE  500 mg Oral Q12H  . nicotine  21 mg  Transdermal Daily  . pantoprazole  40 mg Oral QAC breakfast  . sodium chloride flush  10-40 mL Intracatheter Q12H  . sulfamethoxazole-trimethoprim  1 tablet Oral Q12H   Continuous Infusions: . sodium chloride Stopped (03/31/18 2156)  . DAPTOmycin (CUBICIN)  IV 600 mg (04/18/18 2108)   PRN Meds:.sodium chloride, acetaminophen, bisacodyl **OR** bisacodyl, ondansetron **OR** ondansetron (ZOFRAN) IV, phenol, sodium chloride flush, traMADol, traZODone  Xrays No results found.  Assessment/Plan: S/P Procedure(s) (LRB): TRICUSPID VALVE REPLACEMENT Using 27 mm Magna Ease Mitral Valve Pericardial Bioprosthesis (N/A) TRANSESOPHAGEAL ECHOCARDIOGRAM (TEE) (N/A)  1 conts to do well, no new issues - conts current management     LOS: 30 days    Tiffany Salazar 04/19/2018 Pager [phone]

## 2018-04-19 NOTE — Progress Notes (Signed)
Pt sleeping. Sts she likes to sleep in the am so the day goes by faster. Sts she has been walking in the afternoon/evenings. Encouraged her to continue to do so. Also discussed continuing IS. She sts she is up to 1500 mL. She showed me her crocheting she is working on, I congratulated her. All CR goals have been met and we will sign off.  [id number] Yves Dill CES, ACSM 11:25 AM 04/19/2018

## 2018-04-20 NOTE — Progress Notes (Addendum)
      [address]       [place] [id number]             [phone]      22 Days Post-Op Procedure(s) (LRB): TRICUSPID VALVE REPLACEMENT Using 27 mm Magna Ease Mitral Valve Pericardial Bioprosthesis (N/A) TRANSESOPHAGEAL ECHOCARDIOGRAM (TEE) (N/A)   Subjective:  No new complaints.  Wants to sleep   Objective: Vital signs in last 24 hours: Temp:  [98 F (36.7 C)-98.1 F (36.7 C)] 98 F (36.7 C) (08/27 0520) Pulse Rate:  [69-77] 69 (08/27 0520) Cardiac Rhythm: Normal sinus rhythm (08/27 0700) Resp:  [16-27] 27 (08/26 2042) BP: (90-105)/(60-64) 105/60 (08/26 2042) SpO2:  [93 %-96 %] 93 % (08/26 2042) Weight:  [71.6 kg] 71.6 kg (08/27 0520)  Intake/Output from previous day: 08/26 0701 - 08/27 0700 In: 922 [P.O.:810; IV Piggyback:112] Out: -   General appearance: alert, cooperative and no distress Heart: regular rate and rhythm Lungs: clear to auscultation bilaterally Abdomen: soft, non-tender; bowel sounds normal; no masses,  no organomegaly Extremities: extremities normal, atraumatic, no cyanosis or edema Wound: clean and dry  Lab Results: No results for input(s): WBC, HGB, HCT, PLT in the last 72 hours. BMET: No results for input(s): NA, K, CL, CO2, GLUCOSE, BUN, CREATININE, CALCIUM in the last 72 hours.  PT/INR: No results for input(s): LABPROT, INR in the last 72 hours. ABG    Component Value Date/Time   PHART 7.361 03/29/2018 1911   HCO3 24.2 03/29/2018 1911   TCO2 27 03/30/2018 1707   ACIDBASEDEF 1.0 03/29/2018 1911   O2SAT 100.0 03/29/2018 1911   CBG (last 3)  No results for input(s): GLUCAP in the last 72 hours.  Assessment/Plan: S/P Procedure(s) (LRB): TRICUSPID VALVE REPLACEMENT Using 27 mm Magna Ease Mitral Valve Pericardial Bioprosthesis (N/A) TRANSESOPHAGEAL ECHOCARDIOGRAM (TEE) (N/A)  1. CV- hemodynamically stable 2. Pulm- no acute issues, continue IS 3. GU- + Trichomonas infection, not likely resolved, despite previous  treatment, will continue Flagyl for 7 days of treatment 4. ID- endocarditis- remains afebrile, continue ABX per ID recommendations 5. DIspo- patient stable, continue current care   LOS: 31 days    Tiffany Salazar 04/20/2018   Chart reviewed, patient examined, agree with above. Doing well overall. Concerned about persistent Trichomonas. Continuing Flagyl. I told her that any sexual partners will have to be treated or she will get back again.

## 2018-04-20 NOTE — Progress Notes (Signed)
Nutrition Brief Note  Initial nutrition assessment completed 03/22/18; following since.  Within the last 2 weeks, pt's PO intake has much improved.  She is now consistently consuming 75-100% of her meals.  Goal of meeting >/= 90% of her estimated nutrition needs met.  Pt has approximately 3 more weeks of IV ABX in the hospital. No further follow up warranted; will sign off.  Please consult RD as needed.  Arthur Holms, RD, LDN Pager #: [phone] After-Hours Pager #: [phone]

## 2018-04-21 NOTE — Plan of Care (Signed)
  Problem: Clinical Measurements: Goal: Will remain free from infection Outcome: Progressing Goal: Respiratory complications will improve Outcome: Progressing   Problem: Coping: Goal: Level of anxiety will decrease Outcome: Progressing   

## 2018-04-21 NOTE — Progress Notes (Signed)
Patient requesting medication for nausea.  zofran given as ordered as needed for nausea. Loni Abdon Jessup RN  

## 2018-04-21 NOTE — Progress Notes (Addendum)
      [address]       [place] [id number]             [phone]      23 Days Post-Op Procedure(s) (LRB): TRICUSPID VALVE REPLACEMENT Using 27 mm Magna Ease Mitral Valve Pericardial Bioprosthesis (N/A) TRANSESOPHAGEAL ECHOCARDIOGRAM (TEE) (N/A) Subjective: Feels fine  Objective: Vital signs in last 24 hours: Temp:  [97.8 F (36.6 C)] 97.8 F (36.6 C) (08/28 0554) Cardiac Rhythm: Normal sinus rhythm (08/28 0700) Resp:  [18] 18 (08/28 0554) BP: (101)/(60) 101/60 (08/28 0554) SpO2:  [91 %] 91 % (08/28 0554) Weight:  [72 kg] 72 kg (08/28 0554)  Hemodynamic parameters for last 24 hours:    Intake/Output from previous day: 08/27 0701 - 08/28 0700 In: 720 [P.O.:720] Out: -  Intake/Output this shift: No intake/output data recorded.  General appearance: alert, cooperative and no distress Heart: regular rate and rhythm Lungs: clear to auscultation bilaterally Abdomen: soft, non-tender; bowel sounds normal; no masses,  no organomegaly Extremities: no edema or calf tenderness Wound: incis healing well  Lab Results: No results for input(s): WBC, HGB, HCT, PLT in the last 72 hours. BMET: No results for input(s): NA, K, CL, CO2, GLUCOSE, BUN, CREATININE, CALCIUM in the last 72 hours.  PT/INR: No results for input(s): LABPROT, INR in the last 72 hours. ABG    Component Value Date/Time   PHART 7.361 03/29/2018 1911   HCO3 24.2 03/29/2018 1911   TCO2 27 03/30/2018 1707   ACIDBASEDEF 1.0 03/29/2018 1911   O2SAT 100.0 03/29/2018 1911   CBG (last 3)  No results for input(s): GLUCAP in the last 72 hours.  Meds Scheduled Meds: . aspirin EC  325 mg Oral Daily  . buprenorphine-naloxone  1 tablet Sublingual Daily  . busPIRone  15 mg Oral BID  . Chlorhexidine Gluconate Cloth  6 each Topical Daily  . docusate sodium  200 mg Oral Daily  . metoprolol tartrate  12.5 mg Oral BID  . metroNIDAZOLE  500 mg Oral Q12H  . nicotine  21 mg Transdermal Daily  .  pantoprazole  40 mg Oral QAC breakfast  . sodium chloride flush  10-40 mL Intracatheter Q12H   Continuous Infusions: . sodium chloride Stopped (03/31/18 2156)  . DAPTOmycin (CUBICIN)  IV 600 mg (04/20/18 2156)   PRN Meds:.sodium chloride, acetaminophen, bisacodyl **OR** bisacodyl, ondansetron **OR** ondansetron (ZOFRAN) IV, phenol, sodium chloride flush, traMADol, traZODone  Xrays No results found.  Assessment/Plan: S/P Procedure(s) (LRB): TRICUSPID VALVE REPLACEMENT Using 27 mm Magna Ease Mitral Valve Pericardial Bioprosthesis (N/A) TRANSESOPHAGEAL ECHOCARDIOGRAM (TEE) (N/A)   1doing well on current management, conts current RX/TX   LOS: 32 days    Rowe ClackWayne E Gold 04/21/2018 Pager [phone]   Chart reviewed, patient examined, agree with above.

## 2018-04-21 NOTE — Progress Notes (Signed)
Patient ambulated in hallway today. Eual Lindstrom, Randall AnKristin Jessup RN

## 2018-04-22 LAB — BASIC METABOLIC PANEL
Anion gap: 6 (ref 5–15)
BUN: 12 mg/dL (ref 6–20)
CO2: 25 mmol/L (ref 22–32)
Calcium: 8.5 mg/dL — ABNORMAL LOW (ref 8.9–10.3)
Chloride: 107 mmol/L (ref 98–111)
Creatinine, Ser: 0.97 mg/dL (ref 0.44–1.00)
GFR calc Af Amer: >60 mL/min (ref >60)
GFR calc non Af Amer: >60 mL/min (ref >60)
Glucose, Bld: 93 mg/dL (ref 70–99)
Potassium: 4.3 mmol/L (ref 3.5–5.1)
Sodium: 138 mmol/L (ref 135–145)

## 2018-04-22 LAB — CK: Total CK: 42 U/L (ref 38–234)

## 2018-04-22 NOTE — Progress Notes (Signed)
      [address]       [place] [id number]             [phone]      24 Days Post-Op Procedure(s) (LRB): TRICUSPID VALVE REPLACEMENT Using 27 mm Magna Ease Mitral Valve Pericardial Bioprosthesis (N/A) TRANSESOPHAGEAL ECHOCARDIOGRAM (TEE) (N/A) Subjective: She feels fine this morning. She is just bored being in the hospital  Objective: Vital signs in last 24 hours: Temp:  [97.9 F (36.6 C)-99.1 F (37.3 C)] 97.9 F (36.6 C) (08/29 0351) Pulse Rate:  [55-89] 55 (08/29 0351) Cardiac Rhythm: Normal sinus rhythm (08/29 0701) Resp:  [13-20] 13 (08/29 0351) BP: (89-106)/(48-62) 89/48 (08/29 0351) SpO2:  [95 %-99 %] 95 % (08/29 0351)     Intake/Output from previous day: 08/28 0701 - 08/29 0700 In: 707 [P.O.:480; IV Piggyback:227] Out: -  Intake/Output this shift: No intake/output data recorded.  General appearance: alert, cooperative and no distress Heart: regular rate and rhythm, S1, S2 normal, no murmur, click, rub or gallop Lungs: clear to auscultation bilaterally Abdomen: soft, non-tender; bowel sounds normal; no masses,  no organomegaly Extremities: extremities normal, atraumatic, no cyanosis or edema Wound: clean and dry  Lab Results: No results for input(s): WBC, HGB, HCT, PLT in the last 72 hours. BMET:  Recent Labs    04/22/18 0500  NA 138  K 4.3  CL 107  CO2 25  GLUCOSE 93  BUN 12  CREATININE 0.97  CALCIUM 8.5*    PT/INR: No results for input(s): LABPROT, INR in the last 72 hours. ABG    Component Value Date/Time   PHART 7.361 03/29/2018 1911   HCO3 24.2 03/29/2018 1911   TCO2 27 03/30/2018 1707   ACIDBASEDEF 1.0 03/29/2018 1911   O2SAT 100.0 03/29/2018 1911   CBG (last 3)  No results for input(s): GLUCAP in the last 72 hours.  Assessment/Plan: S/P Procedure(s) (LRB): TRICUSPID VALVE REPLACEMENT Using 27 mm Magna Ease Mitral Valve Pericardial Bioprosthesis (N/A) TRANSESOPHAGEAL ECHOCARDIOGRAM (TEE) (N/A)  1. CV-  hemodynamically stable, NSR 2. Pulm- no acute issues, continue IS 3. GU- + Trichomonas infection, not likely resolved, will continue Flagyl for 7 days of treatment 4. ID- endocarditis- Tmax 99.1, continue ABX per ID recommendations 5. DIspo- patient stable, continue current care   LOS: 33 days    Tiffany Salazar 04/22/2018

## 2018-04-22 NOTE — Progress Notes (Signed)
Pharmacy Antibiotic Note  [name] is a 37 y.o. female admitted on 03/20/2018 with TV endocarditis.  Pharmacy has been consulted for daptomycin dosing x 6 weeks. Patient was treated at hospital in [place] from 7/17 to 7-26 for tricuspid endocarditis, MRSA bacteremia and pulmonary septic emboli. On 7/14 she developed a rash that was thought to be caused by Vancomycin and was changed to Daptomycin at that time. [facility]. S/P valve replacement 8/5.  -CK= 42, SCr= 0.97   Plan: Continue Daptomycin 600mg  (~8mg /kg) IV Q 24 hours BMET/CK every Thursday Per ID - Daptomycing 6 wks post-op (through 9/16)  Stop dates are in place.  Height: 5\' 7"  (170.2 cm) Weight: 158 lb 11.2 oz (72 kg) IBW/kg (Calculated) : 61.6  Temp (24hrs), Avg:98.3 F (36.8 C), Min:97.9 F (36.6 C), Max:99.1 F (37.3 C)  Recent Labs  Lab 04/22/18 0500  CREATININE 0.97    Estimated Creatinine Clearance: 78.7 mL/min (by C-G formula based on SCr of 0.97 mg/dL).    Allergies  Allergen Reactions  . Gentamicin Rash  . Vancomycin Rash    Antimicrobials this admission: Daptomycin (keep >8mg /kg) 7/26 >> (9/16) Bactrim 7/28 (for septic PE)>>(8/14 updated per 8/9 note) Cefuroxime 8/5>>8/7 Metronidazole 2g x 1 for Trichomonas on 8/8 and 8/19 Fluconazole X 2 doses for yeast infection 8/16>>8/18  Microbiology results: 7/28 Blood x 2 - negative 7/29 MRSA PCR negative 8/4 MRSA PCR: negative for MRSA, positive for Staph aureus 8/5 Tissue valve: negative 8/5 tissue valve fungal culture - pending 8/19 wet prep + for Trichomonas  Harland GermanAndrew Solana Coggin, PharmD Clinical Pharmacist Please check Amion for pharmacy contact number

## 2018-04-23 NOTE — Progress Notes (Signed)
      [address]       [place] [id number]             [phone]      25 Days Post-Op Procedure(s) (LRB): TRICUSPID VALVE REPLACEMENT Using 27 mm Magna Ease Mitral Valve Pericardial Bioprosthesis (N/A) TRANSESOPHAGEAL ECHOCARDIOGRAM (TEE) (N/A) Subjective: Looks and feels well, no new issues  Objective: Vital signs in last 24 hours: Temp:  [97.9 F (36.6 C)-99.9 F (37.7 C)] 98.6 F (37 C) (08/30 0604) Pulse Rate:  [71-78] 71 (08/30 1035) Cardiac Rhythm: Normal sinus rhythm (08/30 0700) Resp:  [16-20] 20 (08/30 0604) BP: (91-124)/(56-80) 93/56 (08/30 1035) SpO2:  [100 %] 100 % (08/30 0604) Weight:  [72.3 kg] 72.3 kg (08/30 0604)  Hemodynamic parameters for last 24 hours:    Intake/Output from previous day: 08/29 0701 - 08/30 0700 In: 1326 [P.O.:970; I.V.:20; IV Piggyback:336] Out: -  Intake/Output this shift: Total I/O In: 240 [P.O.:240] Out: -   General appearance: alert, cooperative and no distress Heart: regular rate and rhythm and no murmur Lungs: clear to auscultation bilaterally Abdomen: benign Extremities: no edema Wound: incis healing well  Lab Results: No results for input(s): WBC, HGB, HCT, PLT in the last 72 hours. BMET:  Recent Labs    04/22/18 0500  NA 138  K 4.3  CL 107  CO2 25  GLUCOSE 93  BUN 12  CREATININE 0.97  CALCIUM 8.5*    PT/INR: No results for input(s): LABPROT, INR in the last 72 hours. ABG    Component Value Date/Time   PHART 7.361 03/29/2018 1911   HCO3 24.2 03/29/2018 1911   TCO2 27 03/30/2018 1707   ACIDBASEDEF 1.0 03/29/2018 1911   O2SAT 100.0 03/29/2018 1911   CBG (last 3)  No results for input(s): GLUCAP in the last 72 hours.  Meds Scheduled Meds: . aspirin EC  325 mg Oral Daily  . buprenorphine-naloxone  1 tablet Sublingual Daily  . busPIRone  15 mg Oral BID  . Chlorhexidine Gluconate Cloth  6 each Topical Daily  . docusate sodium  200 mg Oral Daily  . metoprolol tartrate  12.5 mg  Oral BID  . metroNIDAZOLE  500 mg Oral Q12H  . nicotine  21 mg Transdermal Daily  . pantoprazole  40 mg Oral QAC breakfast  . sodium chloride flush  10-40 mL Intracatheter Q12H   Continuous Infusions: . sodium chloride Stopped (03/31/18 2156)  . DAPTOmycin (CUBICIN)  IV Stopped (04/22/18 2314)   PRN Meds:.sodium chloride, acetaminophen, bisacodyl **OR** bisacodyl, ondansetron **OR** ondansetron (ZOFRAN) IV, phenol, sodium chloride flush, traMADol, traZODone  Xrays No results found.  Assessment/Plan: S/P Procedure(s) (LRB): TRICUSPID VALVE REPLACEMENT Using 27 mm Magna Ease Mitral Valve Pericardial Bioprosthesis (N/A) TRANSESOPHAGEAL ECHOCARDIOGRAM (TEE) (N/A)  1 hemodynamically stable in sinus rhythm 2 tmax 99.9- monitor, conts current abx 3 routine rehab modalities  LOS: 34 days    Rowe ClackWayne E Gold 04/23/2018 Pager [phone]

## 2018-04-24 NOTE — Progress Notes (Addendum)
      [address]       [place] [id number]             [phone]      26 Days Post-Op Procedure(s) (LRB): TRICUSPID VALVE REPLACEMENT Using 27 mm Magna Ease Mitral Valve Pericardial Bioprosthesis (N/A) TRANSESOPHAGEAL ECHOCARDIOGRAM (TEE) (N/A) Subjective: No issues. She is in better spirits today.   Objective: Vital signs in last 24 hours: Temp:  [98.1 F (36.7 C)-98.2 F (36.8 C)] 98.1 F (36.7 C) (08/31 0459) Pulse Rate:  [64-71] 64 (08/31 0459) Cardiac Rhythm: Sinus bradycardia (08/31 0700) Resp:  [14-18] 18 (08/31 0618) BP: (87-104)/(54-76) 100/54 (08/31 0618) SpO2:  [96 %-100 %] 96 % (08/31 0459) Weight:  [71.7 kg] 71.7 kg (08/31 0459)     Intake/Output from previous day: 08/30 0701 - 08/31 0700 In: 707 [P.O.:595; IV Piggyback:112] Out: -  Intake/Output this shift: No intake/output data recorded.  General appearance: alert, cooperative and no distress Heart: regular rate and rhythm, S1, S2 normal, no murmur, click, rub or gallop Lungs: clear to auscultation bilaterally Abdomen: soft, non-tender; bowel sounds normal; no masses,  no organomegaly Extremities: extremities normal, atraumatic, no cyanosis or edema Wound: clean and dry  Lab Results: No results for input(s): WBC, HGB, HCT, PLT in the last 72 hours. BMET:  Recent Labs    04/22/18 0500  NA 138  K 4.3  CL 107  CO2 25  GLUCOSE 93  BUN 12  CREATININE 0.97  CALCIUM 8.5*    PT/INR: No results for input(s): LABPROT, INR in the last 72 hours. ABG    Component Value Date/Time   PHART 7.361 03/29/2018 1911   HCO3 24.2 03/29/2018 1911   TCO2 27 03/30/2018 1707   ACIDBASEDEF 1.0 03/29/2018 1911   O2SAT 100.0 03/29/2018 1911   CBG (last 3)  No results for input(s): GLUCAP in the last 72 hours.  Assessment/Plan: S/P Procedure(s) (LRB): TRICUSPID VALVE REPLACEMENT Using 27 mm Magna Ease Mitral Valve Pericardial Bioprosthesis (N/A) TRANSESOPHAGEAL ECHOCARDIOGRAM (TEE)  (N/A)  1. No recent fevers. Last WBC 8.5 2. Incision is healing well without drainage 3. Continue antibiotics 4. Cardiac rehab     LOS: 35 days    Sharlene Doryessa N Conte 04/24/2018  I have seen and examined the patient and agree with the assessment and plan as outlined.  Purcell Nailslarence H Owen, MD 04/24/2018 12:50 PM

## 2018-04-24 NOTE — Plan of Care (Signed)
  Problem: Clinical Measurements: Goal: Will remain free from infection Outcome: Progressing   Problem: Coping: Goal: Level of anxiety will decrease Outcome: Progressing   Problem: Education: Goal: Will demonstrate proper wound care and an understanding of methods to prevent future damage Outcome: Progressing

## 2018-04-25 NOTE — Progress Notes (Addendum)
      [address]       [place] [address]             [phone]      27 Days Post-Op Procedure(s) (LRB): TRICUSPID VALVE REPLACEMENT Using 27 mm Magna Ease Mitral Valve Pericardial Bioprosthesis (N/A) TRANSESOPHAGEAL ECHOCARDIOGRAM (TEE) (N/A) Subjective:  "I feel fine".   Objective: Vital signs in last 24 hours: Temp:  [98 F (36.7 C)-98.2 F (36.8 C)] 98 F (36.7 C) (09/01 0559) Pulse Rate:  [64-74] 74 (09/01 0910) Cardiac Rhythm: Normal sinus rhythm (09/01 0700) Resp:  [15] 15 (09/01 0559) BP: (89-107)/(47-71) 106/71 (09/01 0910) SpO2:  [100 %] 100 % (09/01 0559) Weight:  [72.6 kg] 72.6 kg (09/01 0301)     Intake/Output from previous day: 08/31 0701 - 09/01 0700 In: 1200 [P.O.:1200] Out: -  Intake/Output this shift: Total I/O In: 114 [IV Piggyback:114] Out: -   General appearance: alert, cooperative and no distress Heart: regular rate and rhythm, S1, S2 normal, no murmur, click, rub or gallop Lungs: clear to auscultation bilaterally Abdomen: soft, non-tender; bowel sounds normal; no masses,  no organomegaly Extremities: extremities normal, atraumatic, no cyanosis or edema Wound: clean and dry  Lab Results: No results for input(s): WBC, HGB, HCT, PLT in the last 72 hours. BMET: No results for input(s): NA, K, CL, CO2, GLUCOSE, BUN, CREATININE, CALCIUM in the last 72 hours.  PT/INR: No results for input(s): LABPROT, INR in the last 72 hours. ABG    Component Value Date/Time   PHART 7.361 03/29/2018 1911   HCO3 24.2 03/29/2018 1911   TCO2 27 03/30/2018 1707   ACIDBASEDEF 1.0 03/29/2018 1911   O2SAT 100.0 03/29/2018 1911   CBG (last 3)  No results for input(s): GLUCAP in the last 72 hours.  Assessment/Plan: S/P Procedure(s) (LRB): TRICUSPID VALVE REPLACEMENT Using 27 mm Magna Ease Mitral Valve Pericardial Bioprosthesis (N/A) TRANSESOPHAGEAL ECHOCARDIOGRAM (TEE) (N/A)   1. No recent fevers. Last WBC 8.5 2. Incision is healing  well without drainage 3. Continue antibiotics 4. Cardiac rehab-walked in the halls this morning 5. Renal-creatinine 0.97    LOS: 36 days    Sharlene Dory 04/25/2018  I have seen and examined the patient and agree with the assessment and plan as outlined.  Purcell Nails, MD 04/25/2018 4:50 PM

## 2018-04-25 NOTE — Plan of Care (Signed)
  Problem: Clinical Measurements: Goal: Will remain free from infection Outcome: Progressing   Problem: Education: Goal: Will demonstrate proper wound care and an understanding of methods to prevent future damage Outcome: Progressing   

## 2018-04-25 NOTE — Progress Notes (Signed)
Patient requesting medication for constipation, dulcolax given as ordered as needed for constipation will monitor patient.Tiffany Salazar, Randall An RN

## 2018-04-26 LAB — WET PREP, GENITAL
Sperm: NONE SEEN
Trich, Wet Prep: NONE SEEN
Yeast Wet Prep HPF POC: NONE SEEN

## 2018-04-26 NOTE — Plan of Care (Signed)
  Problem: Clinical Measurements: Goal: Will remain free from infection 04/26/2018 2141 by Renelda Mom, RN Outcome: Progressing 04/26/2018 2139 by Renelda Mom, RN Outcome: Progressing   Problem: Education: Goal: Will demonstrate proper wound care and an understanding of methods to prevent future damage 04/26/2018 2141 by Renelda Mom, RN Outcome: Progressing 04/26/2018 2139 by Renelda Mom, RN Outcome: Progressing

## 2018-04-26 NOTE — Plan of Care (Signed)
  Problem: Clinical Measurements: Goal: Will remain free from infection Outcome: Progressing   Problem: Education: Goal: Will demonstrate proper wound care and an understanding of methods to prevent future damage Outcome: Progressing

## 2018-04-26 NOTE — Progress Notes (Signed)
Patient ambulated in hall approx. 450 ft independantly,

## 2018-04-26 NOTE — Progress Notes (Addendum)
      [address]       [place] [address]             [phone]      28 Days Post-Op Procedure(s) (LRB): TRICUSPID VALVE REPLACEMENT Using 27 mm Magna Ease Mitral Valve Pericardial Bioprosthesis (N/A) TRANSESOPHAGEAL ECHOCARDIOGRAM (TEE) (N/A) Subjective: Sleepy this morning. She is progressing well.   Objective: Vital signs in last 24 hours: Temp:  [97.8 F (36.6 C)-98.3 F (36.8 C)] 98.3 F (36.8 C) (09/02 0505) Pulse Rate:  [62-74] 62 (09/02 0505) Cardiac Rhythm: Normal sinus rhythm (09/02 0743) Resp:  [14-20] 14 (09/02 0505) BP: (95-117)/(58-71) 95/58 (09/02 0505) SpO2:  [98 %-99 %] 98 % (09/02 0505) Weight:  [72.4 kg] 72.4 kg (09/02 0505)     Intake/Output from previous day: 09/01 0701 - 09/02 0700 In: 1554 [P.O.:1440; IV Piggyback:114] Out: -  Intake/Output this shift: No intake/output data recorded.  General appearance: alert, cooperative and no distress Heart: regular rate and rhythm, S1, S2 normal, no murmur, click, rub or gallop Lungs: clear to auscultation bilaterally Abdomen: soft, non-tender; bowel sounds normal; no masses,  no organomegaly Extremities: extremities normal, atraumatic, no cyanosis or edema Wound: clean and dry  Lab Results: No results for input(s): WBC, HGB, HCT, PLT in the last 72 hours. BMET: No results for input(s): NA, K, CL, CO2, GLUCOSE, BUN, CREATININE, CALCIUM in the last 72 hours.  PT/INR: No results for input(s): LABPROT, INR in the last 72 hours. ABG    Component Value Date/Time   PHART 7.361 03/29/2018 1911   HCO3 24.2 03/29/2018 1911   TCO2 27 03/30/2018 1707   ACIDBASEDEF 1.0 03/29/2018 1911   O2SAT 100.0 03/29/2018 1911   CBG (last 3)  No results for input(s): GLUCAP in the last 72 hours.  Assessment/Plan: S/P Procedure(s) (LRB): TRICUSPID VALVE REPLACEMENT Using 27 mm Magna Ease Mitral Valve Pericardial Bioprosthesis (N/A) TRANSESOPHAGEAL ECHOCARDIOGRAM (TEE) (N/A)  1. Hemodynamically  stable and in NSR 2. No recent fevers. Last WBC 8.5 3. Incision is healing well without drainage 4. Continue antibiotics 5. Cardiac rehab-continue to walk in the halls    LOS: 37 days    Sharlene Dory 04/26/2018  I have seen and examined the patient and agree with the assessment and plan as outlined.  Purcell Nails, MD 04/26/2018 10:41 AM

## 2018-04-27 LAB — FUNGUS CULTURE WITH STAIN

## 2018-04-27 LAB — FUNGUS CULTURE RESULT

## 2018-04-27 LAB — FUNGAL ORGANISM REFLEX

## 2018-04-27 NOTE — Progress Notes (Addendum)
      [address]       [place] [address]             [phone]        29 Days Post-Op Procedure(s) (LRB): TRICUSPID VALVE REPLACEMENT Using 27 mm Magna Ease Mitral Valve Pericardial Bioprosthesis (N/A) TRANSESOPHAGEAL ECHOCARDIOGRAM (TEE) (N/A)  Subjective: Patient without complaints this am.  Objective: Vital signs in last 24 hours: Temp:  [98.6 F (37 C)] 98.6 F (37 C) (09/02 2359) Pulse Rate:  [78-79] 79 (09/02 1717) Cardiac Rhythm: Normal sinus rhythm (09/02 1900) Resp:  [17-27] 27 (09/02 2359) BP: (110-129)/(59-71) 129/70 (09/02 2359) SpO2:  [97 %-100 %] 97 % (09/02 2359)   Current Weight  04/26/18 72.4 kg       Intake/Output from previous day: 09/02 0701 - 09/03 0700 In: 960 [P.O.:960] Out: -    Physical Exam:  Cardiovascular: RRR, no murmur Pulmonary: Clear to auscultation bilaterallyt. Extremities: No lower extremity edema. Wound: Clean and dry.  No erythema or signs of infection.  Lab Results: CBC:No results for input(s): WBC, HGB, HCT, PLT in the last 72 hours. BMET: No results for input(s): NA, K, CL, CO2, GLUCOSE, BUN, CREATININE, CALCIUM in the last 72 hours.  PT/INR:  Lab Results  Component Value Date   INR 1.51 03/29/2018   INR 1.03 03/20/2018   ABG:  INR: Will add last result for INR, ABG once components are confirmed Will add last 4 CBG results once components are confirmed  Assessment/Plan:  1. CV - SR. On Lopressor 12.5 mg bid 2.  Pulmonary - On room air.  3. ID-on Daptomycin 600 mg IV 4. Substance abuse-on Suboxone, Buspar  Donielle M ZimmermanPA-C 04/27/2018,7:29 AM [phone]  Chart reviewed, patient examined, agree with above. She feels fine. Continuing Daptomycin IV until 05/10/2018

## 2018-04-28 NOTE — Progress Notes (Addendum)
      [address]       [place] [address]             [phone]        30 Days Post-Op Procedure(s) (LRB): TRICUSPID VALVE REPLACEMENT Using 27 mm Magna Ease Mitral Valve Pericardial Bioprosthesis (N/A) TRANSESOPHAGEAL ECHOCARDIOGRAM (TEE) (N/A)  Subjective: Patient sleeping this am.  Objective: Vital signs in last 24 hours: Temp:  [98 F (36.7 C)-98.4 F (36.9 C)] 98.4 F (36.9 C) (09/04 0628) Pulse Rate:  [70-72] 70 (09/04 0628) Cardiac Rhythm: Normal sinus rhythm (09/03 2239) Resp:  [13-19] 19 (09/04 0628) BP: (89-108)/(45-66) 89/45 (09/04 0628) SpO2:  [98 %-100 %] 100 % (09/04 0628) Weight:  [73.4 kg-73.6 kg] 73.6 kg (09/04 0550)   Current Weight  04/28/18 73.6 kg       Intake/Output from previous day: 09/03 0701 - 09/04 0700 In: 944.1 [P.O.:720; IV Piggyback:224.1] Out: -    Physical Exam:  Cardiovascular: RRR, no murmur Pulmonary: Clear to auscultation bilaterallyt. Extremities: No lower extremity edema. Wound: Clean and dry.  No erythema or signs of infection.  Lab Results: CBC:No results for input(s): WBC, HGB, HCT, PLT in the last 72 hours. BMET: No results for input(s): NA, K, CL, CO2, GLUCOSE, BUN, CREATININE, CALCIUM in the last 72 hours.  PT/INR:  Lab Results  Component Value Date   INR 1.51 03/29/2018   INR 1.03 03/20/2018   ABG:  INR: Will add last result for INR, ABG once components are confirmed Will add last 4 CBG results once components are confirmed  Assessment/Plan:  1. CV - SR. On Lopressor 12.5 mg bid. Monitor BP and HR and if continues to be low, will stop BB 2.  Pulmonary - On room air.  3. ID-on Daptomycin 600 mg IV. Last day of this is 05/10/2018. 4. Substance abuse-on Suboxone, Buspar  Donielle M ZimmermanPA-C 04/28/2018,7:19 AM [phone]    Chart reviewed, patient examined, agree with above. She feels well and continuing antibiotics.

## 2018-04-29 LAB — CBC
HCT: 32.9 % — ABNORMAL LOW (ref 36.0–46.0)
Hemoglobin: 9.8 g/dL — ABNORMAL LOW (ref 12.0–15.0)
MCH: 30 pg (ref 26.0–34.0)
MCHC: 29.8 g/dL — ABNORMAL LOW (ref 30.0–36.0)
MCV: 100.6 fL — ABNORMAL HIGH (ref 78.0–100.0)
Platelets: 194 10*3/uL (ref 150–400)
RBC: 3.27 MIL/uL — ABNORMAL LOW (ref 3.87–5.11)
RDW: 14 % (ref 11.5–15.5)
WBC: 6.5 10*3/uL (ref 4.0–10.5)

## 2018-04-29 LAB — BASIC METABOLIC PANEL
Anion gap: 8 (ref 5–15)
BUN: 14 mg/dL (ref 6–20)
CO2: 23 mmol/L (ref 22–32)
Calcium: 8.5 mg/dL — ABNORMAL LOW (ref 8.9–10.3)
Chloride: 107 mmol/L (ref 98–111)
Creatinine, Ser: 0.82 mg/dL (ref 0.44–1.00)
GFR calc Af Amer: >60 mL/min (ref >60)
GFR calc non Af Amer: >60 mL/min (ref >60)
Glucose, Bld: 97 mg/dL (ref 70–99)
Potassium: 4.2 mmol/L (ref 3.5–5.1)
Sodium: 138 mmol/L (ref 135–145)

## 2018-04-29 LAB — CK: Total CK: 53 U/L (ref 38–234)

## 2018-04-29 MED ORDER — METOPROLOL TARTRATE 12.5 MG HALF TABLET
12.5000 mg | ORAL_TABLET | Freq: Every day | ORAL | Status: DC
  Filled 2018-04-29: qty 1

## 2018-04-29 NOTE — Progress Notes (Signed)
Pharmacy Antibiotic Note  Tiffany Salazar is a 37 y.o. female admitted on 03/20/2018 with TV endocarditis.  Pharmacy was consulted on 7/27 for daptomycin dosing x 6 weeks. Patient was treated at hospital in [place] from 7/17 to 7-26 for tricuspid endocarditis, MRSA bacteremia and pulmonary septic emboli. On 7/14 she developed a rash that was thought to be caused by Vancomycin and was changed to Daptomycin at that time. She left AMA on 7/26 to return to [facility]. S/P valve replacement 8/5.  Monitorin weekly CK and BMET.  Today 04/29/18: CK= 53 remains wnl, SCr= 0.82 stable   Plan: Continue Daptomycin 600mg  (~8mg /kg) IV Q 24 hours BMET/CK every Thursday Per ID - Daptomycing 6 wks post-op (through 9/16)  Stop dates are in place.  Height: 5\' 7"  (170.2 cm) Weight: 164 lb 1.6 oz (74.4 kg) IBW/kg (Calculated) : 61.6  Temp (24hrs), Avg:98.1 F (36.7 C), Min:97.6 F (36.4 C), Max:98.4 F (36.9 C)  Recent Labs  Lab 04/29/18 0534  WBC 6.5  CREATININE 0.82    Estimated Creatinine Clearance: 100.8 mL/min (by C-G formula based on SCr of 0.82 mg/dL).    Allergies  Allergen Reactions  . Gentamicin Rash  . Vancomycin Rash    Antimicrobials this admission: Daptomycin (keep >8mg /kg) 7/26 >> (9/16) Bactrim 7/28 (for septic PE)>>(8/14 updated per 8/9 note) Cefuroxime 8/5>>8/7 Metronidazole 2g x 1 for Trichomonas on 8/8 and 8/19 Fluconazole X 2 doses for yeast infection 8/16>>8/18  Microbiology results: 7/28 Blood x 2 - negative 7/29 MRSA PCR negative 8/4 MRSA PCR: negative for MRSA, positive for Staph aureus 8/5 Tissue valve: negative 8/5 tissue valve fungal culture -negative  8/19 wet prep + for Trichomonas   Thank you for allowing pharmacy to be part of this patients care team. Noah Delaine, RPh Clinical Pharmacist Pager: [phone] Please check AMION for all [facility] phone numbers After 10:00 PM, call Main Pharmacy [phone]

## 2018-04-29 NOTE — Progress Notes (Addendum)
      [address]       [place] [address]             [phone]        31 Days Post-Op Procedure(s) (LRB): TRICUSPID VALVE REPLACEMENT Using 27 mm Magna Ease Mitral Valve Pericardial Bioprosthesis (N/A) TRANSESOPHAGEAL ECHOCARDIOGRAM (TEE) (N/A)  Subjective: Patient sleeping this am.  Objective: Vital signs in last 24 hours: Temp:  [98 F (36.7 C)-98.4 F (36.9 C)] 98.4 F (36.9 C) (09/05 0350) Pulse Rate:  [64-76] 71 (09/05 0350) Cardiac Rhythm: Normal sinus rhythm (09/04 2000) Resp:  [15-18] 15 (09/05 0350) BP: (95-107)/(53-87) 107/57 (09/05 0350) SpO2:  [90 %-100 %] 99 % (09/05 0350) Weight:  [74.4 kg] 74.4 kg (09/05 0532)   Current Weight  04/29/18 74.4 kg       Intake/Output from previous day: 09/04 0701 - 09/05 0700 In: 1080 [P.O.:1080] Out: -    Physical Exam:  Cardiovascular: RRR, no murmur Pulmonary: Clear to auscultation bilaterallyt. Extremities: No lower extremity edema. Wound: Clean and dry.  No erythema or signs of infection.  Lab Results: CBC: Recent Labs    04/29/18 0534  WBC 6.5  HGB 9.8*  HCT 32.9*  PLT 194   BMET:  Recent Labs    04/29/18 0534  NA 138  K 4.2  CL 107  CO2 23  GLUCOSE 97  BUN 14  CREATININE 0.82  CALCIUM 8.5*    PT/INR:  Lab Results  Component Value Date   INR 1.51 03/29/2018   INR 1.03 03/20/2018   ABG:  INR: Will add last result for INR, ABG once components are confirmed Will add last 4 CBG results once components are confirmed  Assessment/Plan:  1. CV - SR. On Lopressor 12.5 mg bid. Will decrease to once daily for now as sometimes not receiving both doses. 2.  Pulmonary - On room air. Encourage incentive spirometer. 3. ID-on Daptomycin 600 mg IV. Last day of this is 05/10/2018. 4. Substance abuse-on Suboxone, Buspar 5. Anemia-H and H this am 9.8 and 32.9  Donielle M ZimmermanPA-C 04/29/2018,7:18 AM [phone]   Chart reviewed, patient examined, agree with  above. She says she feels fine. Looking forward to going home. Continue IV Daptomycin until 05/10/2018.

## 2018-04-30 NOTE — Progress Notes (Addendum)
      [address]       [place] [place] [address]             [phone]        32 Days Post-Op Procedure(s) (LRB): TRICUSPID VALVE REPLACEMENT Using 27 mm Magna Ease Mitral Valve Pericardial Bioprosthesis (N/A) TRANSESOPHAGEAL ECHOCARDIOGRAM (TEE) (N/A)  Subjective: Patient sleeping this am-awakened  Objective: Vital signs in last 24 hours: Temp:  [97.6 F (36.4 C)-99.4 F (37.4 C)] 98.4 F (36.9 C) (09/06 0514) Pulse Rate:  [74-90] 74 (09/06 0514) Cardiac Rhythm: Normal sinus rhythm (09/05 2207) Resp:  [14-17] 14 (09/06 0514) BP: (94-109)/(55-91) 102/55 (09/06 0514) SpO2:  [97 %-98 %] 97 % (09/06 0514) Weight:  [73.9 kg] 73.9 kg (09/06 0600)   Current Weight  04/30/18 73.9 kg      Intake/Output from previous day: 09/05 0701 - 09/06 0700 In: 584 [P.O.:360; IV Piggyback:224] Out: -    Physical Exam:  Cardiovascular: RRR, no murmur Pulmonary: Clear to auscultation bilaterally Extremities: No lower extremity edema. Wound: Clean and dry.  No erythema or signs of infection.  Lab Results: CBC: Recent Labs    04/29/18 0534  WBC 6.5  HGB 9.8*  HCT 32.9*  PLT 194   BMET:  Recent Labs    04/29/18 0534  NA 138  K 4.2  CL 107  CO2 23  GLUCOSE 97  BUN 14  CREATININE 0.82  CALCIUM 8.5*    PT/INR:  Lab Results  Component Value Date   INR 1.51 03/29/2018   INR 1.03 03/20/2018   ABG:  INR: Will add last result for INR, ABG once components are confirmed Will add last 4 CBG results once components are confirmed  Assessment/Plan:  1. CV - SR. Lopressor was stopped yesterday secondary to labile BP. Will monitor for now. 2.  Pulmonary - On room air. Encourage incentive spirometer. 3. ID-on Daptomycin 600 mg IV. Last day of this is 05/10/2018. 4. Substance abuse-on Suboxone, Buspar 5. Anemia-H and H this am 9.8 and 32.9  Tiffany Salazar M ZimmermanPA-C 04/30/2018,7:07 AM [phone]

## 2018-05-01 MED ORDER — ALTEPLASE 2 MG IJ SOLR: 2.0000 mg | Freq: Once | INTRAMUSCULAR | Status: DC

## 2018-05-01 MED ORDER — INFLUENZA VAC SPLIT QUAD 0.5 ML IM SUSY
0.5000 mL | PREFILLED_SYRINGE | INTRAMUSCULAR | Status: AC
  Administered 2018-05-04: 0.5 mL via INTRAMUSCULAR
  Filled 2018-05-01: qty 0.5

## 2018-05-01 MED ORDER — PNEUMOCOCCAL VAC POLYVALENT 25 MCG/0.5ML IJ INJ
0.5000 mL | INJECTION | INTRAMUSCULAR | Status: DC
  Filled 2018-05-01: qty 0.5

## 2018-05-01 NOTE — Progress Notes (Signed)
33 Days Post-Op Procedure(s) (LRB): TRICUSPID VALVE REPLACEMENT Using 27 mm Magna Ease Mitral Valve Pericardial Bioprosthesis (N/A) TRANSESOPHAGEAL ECHOCARDIOGRAM (TEE) (N/A) Subjective: No complaints  Objective: Vital signs in last 24 hours: Temp:  [98.7 F (37.1 C)-99 F (37.2 C)] 99 F (37.2 C) (09/07 0349) Pulse Rate:  [77-93] 93 (09/07 0349) Cardiac Rhythm: Normal sinus rhythm (09/07 0800) Resp:  [16-17] 16 (09/07 0800) BP: (99-118)/(67-69) 99/69 (09/07 0349) SpO2:  [97 %-98 %] 98 % (09/07 0349) Weight:  [74.9 kg] 74.9 kg (09/07 0242)  Hemodynamic parameters for last 24 hours:    Intake/Output from previous day: 09/06 0701 - 09/07 0700 In: 1484 [P.O.:1260; IV Piggyback:224] Out: -  Intake/Output this shift: Total I/O In: 240 [P.O.:240] Out: -   General appearance: alert and cooperative Heart: regular rate and rhythm, S1, S2 normal, no murmur, click, rub or gallop Lungs: clear to auscultation bilaterally  Lab Results: Recent Labs    04/29/18 0534  WBC 6.5  HGB 9.8*  HCT 32.9*  PLT 194   BMET:  Recent Labs    04/29/18 0534  NA 138  K 4.2  CL 107  CO2 23  GLUCOSE 97  BUN 14  CREATININE 0.82  CALCIUM 8.5*    PT/INR: No results for input(s): LABPROT, INR in the last 72 hours. ABG    Component Value Date/Time   PHART 7.361 03/29/2018 1911   HCO3 24.2 03/29/2018 1911   TCO2 27 03/30/2018 1707   ACIDBASEDEF 1.0 03/29/2018 1911   O2SAT 100.0 03/29/2018 1911   CBG (last 3)  No results for input(s): GLUCAP in the last 72 hours.  Assessment/Plan: S/P Procedure(s) (LRB): TRICUSPID VALVE REPLACEMENT Using 27 mm Magna Ease Mitral Valve Pericardial Bioprosthesis (N/A) TRANSESOPHAGEAL ECHOCARDIOGRAM (TEE) (N/A)  Doing well. Continue Daptomycin.   LOS: 42 days    Alleen Borne 05/01/2018

## 2018-05-02 NOTE — Plan of Care (Signed)
  Problem: Clinical Measurements: Goal: Will remain free from infection Outcome: Progressing   Problem: Education: Goal: Will demonstrate proper wound care and an understanding of methods to prevent future damage Outcome: Progressing   

## 2018-05-02 NOTE — Plan of Care (Signed)
  Problem: Clinical Measurements: Goal: Will remain free from infection Outcome: Completed/Met   Problem: Education: Goal: Will demonstrate proper wound care and an understanding of methods to prevent future damage Outcome: Completed/Met

## 2018-05-02 NOTE — Progress Notes (Addendum)
      [address]       [place] [address]             [phone]        34 Days Post-Op Procedure(s) (LRB): TRICUSPID VALVE REPLACEMENT Using 27 mm Magna Ease Mitral Valve Pericardial Bioprosthesis (N/A) TRANSESOPHAGEAL ECHOCARDIOGRAM (TEE) (N/A)  Subjective: Patient sleeping this am-awakened  Objective: Vital signs in last 24 hours: Temp:  [98 F (36.7 C)-98.1 F (36.7 C)] 98.1 F (36.7 C) (09/08 0434) Pulse Rate:  [70-80] 70 (09/08 0434) Cardiac Rhythm: Normal sinus rhythm (09/07 1953) Resp:  [12-20] 12 (09/08 0434) BP: (92-101)/(52-87) 92/52 (09/08 0434) SpO2:  [95 %-100 %] 95 % (09/08 0434)   Current Weight  05/01/18 74.9 kg      Intake/Output from previous day: 09/07 0701 - 09/08 0700 In: 944 [P.O.:720; IV Piggyback:224] Out: -    Physical Exam:  Cardiovascular: RRR, no murmur Pulmonary: Clear to auscultation bilaterally Extremities: No lower extremity edema. Wound: Clean and dry.  No erythema or signs of infection.  Lab Results: CBC: No results for input(s): WBC, HGB, HCT, PLT in the last 72 hours. BMET:  No results for input(s): NA, K, CL, CO2, GLUCOSE, BUN, CREATININE, CALCIUM in the last 72 hours.  PT/INR:  Lab Results  Component Value Date   INR 1.51 03/29/2018   INR 1.03 03/20/2018   ABG:  INR: Will add last result for INR, ABG once components are confirmed Will add last 4 CBG results once components are confirmed  Assessment/Plan:  1. CV - SR.  2.  Pulmonary - On room air. Encourage incentive spirometer. 3. ID-on Daptomycin 600 mg IV. Last day of this is 05/10/2018. 4. Substance abuse-on Suboxone, Buspar 5. Anemia-Last H and H  9.8 and 32.9  Donielle M ZimmermanPA-C 05/02/2018,7:20 AM [phone]    Chart reviewed, patient examined, agree with above. Continue IV Daptomycin until 9/16

## 2018-05-03 NOTE — Progress Notes (Addendum)
      [address]       [place] [address]             [phone]      35 Days Post-Op Procedure(s) (LRB): TRICUSPID VALVE REPLACEMENT Using 27 mm Magna Ease Mitral Valve Pericardial Bioprosthesis (N/A) TRANSESOPHAGEAL ECHOCARDIOGRAM (TEE) (N/A) Subjective: Feels fine  Objective: Vital signs in last 24 hours: Temp:  [98.1 F (36.7 C)-98.2 F (36.8 C)] 98.2 F (36.8 C) (09/09 0454) Pulse Rate:  [66-74] 74 (09/09 0454) Cardiac Rhythm: Normal sinus rhythm (09/09 0701) Resp:  [13-23] 13 (09/09 0454) BP: (83-93)/(48-66) 83/48 (09/09 0454) SpO2:  [96 %-97 %] 96 % (09/09 0454) Weight:  [73.2 kg-73.6 kg] 73.6 kg (09/09 0454)  Hemodynamic parameters for last 24 hours:    Intake/Output from previous day: 09/08 0701 - 09/09 0700 In: 112 [IV Piggyback:112] Out: -  Intake/Output this shift: No intake/output data recorded.  General appearance: alert, cooperative and no distress Heart: regular rate and rhythm Lungs: clear to auscultation bilaterally Abdomen: benign exam Extremities: no edema or calf tendernes Wound: incis healing well  Lab Results: No results for input(s): WBC, HGB, HCT, PLT in the last 72 hours. BMET: No results for input(s): NA, K, CL, CO2, GLUCOSE, BUN, CREATININE, CALCIUM in the last 72 hours.  PT/INR: No results for input(s): LABPROT, INR in the last 72 hours. ABG    Component Value Date/Time   PHART 7.361 03/29/2018 1911   HCO3 24.2 03/29/2018 1911   TCO2 27 03/30/2018 1707   ACIDBASEDEF 1.0 03/29/2018 1911   O2SAT 100.0 03/29/2018 1911   CBG (last 3)  No results for input(s): GLUCAP in the last 72 hours.  Meds Scheduled Meds: . alteplase  2 mg Intracatheter Once  . aspirin EC  325 mg Oral Daily  . buprenorphine-naloxone  1 tablet Sublingual Daily  . busPIRone  15 mg Oral BID  . Chlorhexidine Gluconate Cloth  6 each Topical Daily  . docusate sodium  200 mg Oral Daily  . Influenza vac split quadrivalent PF  0.5 mL  Intramuscular Tomorrow-1000  . nicotine  21 mg Transdermal Daily  . pantoprazole  40 mg Oral QAC breakfast  . pneumococcal 23 valent vaccine  0.5 mL Intramuscular Tomorrow-1000  . sodium chloride flush  10-40 mL Intracatheter Q12H   Continuous Infusions: . sodium chloride Stopped (03/31/18 2156)  . DAPTOmycin (CUBICIN)  IV 600 mg (05/02/18 2059)   PRN Meds:.sodium chloride, acetaminophen, bisacodyl **OR** bisacodyl, ondansetron **OR** ondansetron (ZOFRAN) IV, phenol, sodium chloride flush, traMADol, traZODone  Xrays No results found.  Assessment/Plan: S/P Procedure(s) (LRB): TRICUSPID VALVE REPLACEMENT Using 27 mm Magna Ease Mitral Valve Pericardial Bioprosthesis (N/A) TRANSESOPHAGEAL ECHOCARDIOGRAM (TEE) (N/A)  1 remains very stable on current RX/TX 2 plans as outlined  LOS: 44 days    Tiffany Salazar 05/03/2018 Pager [phone]   Chart reviewed, patient examined, agree with above. She feels fine and continuing antibiotic until 9/16
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
## 2018-05-04 NOTE — Progress Notes (Addendum)
      [address]       [place] [address]             [phone]        36 Days Post-Op Procedure(s) (LRB): TRICUSPID VALVE REPLACEMENT Using 27 mm Magna Ease Mitral Valve Pericardial Bioprosthesis (N/A) TRANSESOPHAGEAL ECHOCARDIOGRAM (TEE) (N/A)  Subjective: Patient sleeping this am  Objective: Vital signs in last 24 hours: Temp:  [98 F (36.7 C)-99 F (37.2 C)] 98 F (36.7 C) (09/10 0428) Pulse Rate:  [63-88] 63 (09/10 0428) Cardiac Rhythm: Normal sinus rhythm (09/09 2215) Resp:  [14-19] 19 (09/09 2218) BP: (89-106)/(55-89) 89/55 (09/10 0428) SpO2:  [97 %-100 %] 99 % (09/10 0428) Weight:  [73.9 kg] 73.9 kg (09/10 0432)   Current Weight  05/04/18 73.9 kg      Intake/Output from previous day: 09/09 0701 - 09/10 0700 In: 832 [P.O.:720; IV Piggyback:112] Out: -    Physical Exam:  Cardiovascular: RRR, no murmur Pulmonary: Clear to auscultation bilaterally Extremities: No lower extremity edema. Wound: Clean and dry.  No erythema or signs of infection.  Lab Results: CBC: No results for input(s): WBC, HGB, HCT, PLT in the last 72 hours. BMET:  No results for input(s): NA, K, CL, CO2, GLUCOSE, BUN, CREATININE, CALCIUM in the last 72 hours.  PT/INR:  Lab Results  Component Value Date   INR 1.51 03/29/2018   INR 1.03 03/20/2018   ABG:  INR: Will add last result for INR, ABG once components are confirmed Will add last 4 CBG results once components are confirmed  Assessment/Plan:  1. CV - SR.  2.  Pulmonary - On room air. Encourage incentive spirometer. 3. ID-on Daptomycin 600 mg IV. Last day of this is 05/10/2018. 4. Substance abuse-on Suboxone, Buspar 5. Anemia-Last H and H  9.8 and 32.9  Donielle M ZimmermanPA-C 05/04/2018,7:14 AM [phone]   Agree with above. She remains very stable and just waiting to finish antibiotic.

## 2018-05-05 NOTE — Progress Notes (Signed)
      [address]       [place] [address]             [phone]        37 Days Post-Op Procedure(s) (LRB): TRICUSPID VALVE REPLACEMENT Using 27 mm Magna Ease Mitral Valve Pericardial Bioprosthesis (N/A) TRANSESOPHAGEAL ECHOCARDIOGRAM (TEE) (N/A)  Subjective: Patient sleeping this am  Objective: Vital signs in last 24 hours: Temp:  [98.2 F (36.8 C)-98.3 F (36.8 C)] 98.2 F (36.8 C) (09/10 1939) Pulse Rate:  [89-99] 99 (09/10 1939) Cardiac Rhythm: Sinus tachycardia (09/10 1900) Resp:  [19-26] 26 (09/10 1939) BP: (89-104)/(63-68) 99/63 (09/10 1939) SpO2:  [95 %-100 %] 100 % (09/10 1939) Weight:  [74.2 kg] 74.2 kg (09/11 0531)   Current Weight  05/05/18 74.2 kg      Intake/Output from previous day: 09/10 0701 - 09/11 0700 In: 1112 [P.O.:960; I.V.:40; IV Piggyback:112] Out: -    Physical Exam:  Cardiovascular: RRR, no murmur Pulmonary: Clear to auscultation bilaterally Extremities: No lower extremity edema. Wound: Clean, dry, well healed.  Lab Results: CBC: No results for input(s): WBC, HGB, HCT, PLT in the last 72 hours. BMET:  No results for input(s): NA, K, CL, CO2, GLUCOSE, BUN, CREATININE, CALCIUM in the last 72 hours.  PT/INR:  Lab Results  Component Value Date   INR 1.51 03/29/2018   INR 1.03 03/20/2018   ABG:  INR: Will add last result for INR, ABG once components are confirmed Will add last 4 CBG results once components are confirmed  Assessment/Plan:  1. CV - SR.  2.  Pulmonary - On room air. Encourage incentive spirometer. 3. ID-on Daptomycin 600 mg IV. Last day of this is 05/10/2018. 4. Substance abuse-on Suboxone, Buspar 5. Anemia-Last H and H  9.8 and 32.9  Rik Wadel M ZimmermanPA-C 05/05/2018,7:11 AM [phone]

## 2018-05-06 LAB — BASIC METABOLIC PANEL
Anion gap: 6 (ref 5–15)
BUN: 14 mg/dL (ref 6–20)
CO2: 25 mmol/L (ref 22–32)
Calcium: 8.4 mg/dL — ABNORMAL LOW (ref 8.9–10.3)
Chloride: 106 mmol/L (ref 98–111)
Creatinine, Ser: 0.84 mg/dL (ref 0.44–1.00)
GFR calc Af Amer: >60 mL/min (ref >60)
GFR calc non Af Amer: >60 mL/min (ref >60)
Glucose, Bld: 107 mg/dL — ABNORMAL HIGH (ref 70–99)
Potassium: 4.2 mmol/L (ref 3.5–5.1)
Sodium: 137 mmol/L (ref 135–145)

## 2018-05-06 LAB — CBC
HCT: 33.2 % — ABNORMAL LOW (ref 36.0–46.0)
Hemoglobin: 9.9 g/dL — ABNORMAL LOW (ref 12.0–15.0)
MCH: 29.3 pg (ref 26.0–34.0)
MCHC: 29.8 g/dL — ABNORMAL LOW (ref 30.0–36.0)
MCV: 98.2 fL (ref 78.0–100.0)
Platelets: 193 10*3/uL (ref 150–400)
RBC: 3.38 MIL/uL — ABNORMAL LOW (ref 3.87–5.11)
RDW: 13.4 % (ref 11.5–15.5)
WBC: 7 10*3/uL (ref 4.0–10.5)

## 2018-05-06 LAB — CK: Total CK: 56 U/L (ref 38–234)

## 2018-05-06 NOTE — Progress Notes (Signed)
Pharmacy Antibiotic Note  Tiffany Salazar [name] is a 37 y.o. female admitted on 03/20/2018 with TV endocarditis.  Pharmacy was consulted on 7/27 for daptomycin dosing x 6 weeks. Patient was treated at hospital in [place] from 7/17 to 7-26 for tricuspid endocarditis, MRSA bacteremia and pulmonary septic emboli. On 7/14 she developed a rash that was thought to be caused by Vancomycin and was changed to Daptomycin at that time. She left A[facility] on 7[place]. S/P valve replacement 8/5.  Monitorin weekly CK and BMET.  Today 05/06/18 CK= 56 remains wnl, SCr stable   Plan: Continue Daptomycin 600mg  (~8mg /kg) IV Q 24 hours BMET/CK every Thursday Per ID - Daptomycing 6 wks post-op (through 9/16)  Stop dates are in place.  Height: 5\' 7"  (170.2 cm) Weight: 163 lb 2.3 oz (74 kg) IBW/kg (Calculated) : 61.6  Temp (24hrs), Avg:98.3 F (36.8 C), Min:98 F (36.7 C), Max:98.9 F (37.2 C)  Recent Labs  Lab 05/06/18 0500  WBC 7.0  CREATININE 0.84    Estimated Creatinine Clearance: 98.3 mL/min (by C-G formula based on SCr of 0.84 mg/dL).    Allergies  Allergen Reactions  . Gentamicin Rash  . Vancomycin Rash    Antimicrobials this admission: Daptomycin (keep >8mg /kg) 7/26 >> (9/16) Bactrim 7/28 (for septic PE)>>(8/14 updated per 8/9 note) Cefuroxime 8/5>>8/7 Metronidazole 2g x 1 for Trichomonas on 8/8 and 8/19 Fluconazole X 2 doses for yeast infection 8/16>>8/18  Microbiology results: 7/28 Blood x 2 - negative 7/29 MRSA PCR negative 8/4 MRSA PCR: negative for MRSA, positive for Staph aureus 8/5 Tissue valve: negative 8/5 tissue valve fungal culture -negative  8/19 wet prep + for Trichomonas   Thank you for allowing pharmacy to be part of this patients care team. Okey RegalLisa Latreshia Beauchaine, PharmD [phone] Please check AMION for all [facility] phone numbers After 10:00 PM, call Main Pharmacy [phone]

## 2018-05-06 NOTE — Progress Notes (Addendum)
      [address]       [place] [id number]             [phone]        38 Days Post-Op Procedure(s) (LRB): TRICUSPID VALVE REPLACEMENT Using 27 mm Magna Ease Mitral Valve Pericardial Bioprosthesis (N/A) TRANSESOPHAGEAL ECHOCARDIOGRAM (TEE) (N/A)  Subjective: Patient sleeping again this am  Objective: Vital signs in last 24 hours: Temp:  [98 F (36.7 C)-98.9 F (37.2 C)] 98.1 F (36.7 C) (09/12 0400) Pulse Rate:  [73-86] 86 (09/11 2020) Cardiac Rhythm: Sinus tachycardia (09/11 1900) Resp:  [13-21] 13 (09/12 0400) BP: (96-103)/(58-68) 96/58 (09/12 0400) SpO2:  [99 %-100 %] 99 % (09/12 0400) Weight:  [74 kg] 74 kg (09/12 0500)   Current Weight  05/06/18 74 kg      Intake/Output from previous day: 09/11 0701 - 09/12 0700 In: 852 [P.O.:720; I.V.:20; IV Piggyback:112] Out: 2 [Urine:2]   Physical Exam:  Cardiovascular: RRR, no murmur Pulmonary: Clear to auscultation bilaterally Extremities: No lower extremity edema. Wound: Clean, dry, well healed.  Lab Results: CBC: Recent Labs    05/06/18 0500  WBC 7.0  HGB 9.9*  HCT 33.2*  PLT 193   BMET:  Recent Labs    05/06/18 0500  NA 137  K 4.2  CL 106  CO2 25  GLUCOSE 107*  BUN 14  CREATININE 0.84  CALCIUM 8.4*    PT/INR:  Lab Results  Component Value Date   INR 1.51 03/29/2018   INR 1.03 03/20/2018   ABG:  INR: Will add last result for INR, ABG once components are confirmed Will add last 4 CBG results once components are confirmed  Assessment/Plan:  1. CV - SR.  2.  Pulmonary - On room air. Encourage incentive spirometer. 3. ID-on Daptomycin 600 mg IV. Last day of this is 05/10/2018. 4. Substance abuse-on Suboxone, Buspar 5. Anemia-Last H and H  9.9 and 33.2  Donielle M ZimmermanPA-C 05/06/2018,7:11 AM [phone]  Agree with above.

## 2018-05-07 NOTE — Progress Notes (Signed)
      [address]       [place], [id number]             [phone]        39 Days Post-Op Procedure(s) (LRB): TRICUSPID VALVE REPLACEMENT Using 27 mm Magna Ease Mitral Valve Pericardial Bioprosthesis (N/A) TRANSESOPHAGEAL ECHOCARDIOGRAM (TEE) (N/A)  Subjective: Patient requesting IV antibiotics be given early on Sunday so she can be discharged early Monday as needs to pick up daughter.  Objective: Vital signs in last 24 hours: Temp:  [97.8 F (36.6 C)] 97.8 F (36.6 C) (09/12 1423) Pulse Rate:  [76] 76 (09/12 1423) Cardiac Rhythm: Normal sinus rhythm (09/12 2208) Resp:  [20] 20 (09/12 1423) BP: (85)/(56) 85/56 (09/12 1423) Weight:  [75.4 kg] 75.4 kg (09/13 0542)   Current Weight  05/07/18 75.4 kg      Intake/Output from previous day: 09/12 0701 - 09/13 0700 In: 632 [P.O.:520; IV Piggyback:112] Out: -    Physical Exam:  Cardiovascular: RRR, no murmur Pulmonary: Clear to auscultation bilaterally Extremities: No lower extremity edema. Wound: Clean, dry, well healed.  Lab Results: CBC: Recent Labs    05/06/18 0500  WBC 7.0  HGB 9.9*  HCT 33.2*  PLT 193   BMET:  Recent Labs    05/06/18 0500  NA 137  K 4.2  CL 106  CO2 25  GLUCOSE 107*  BUN 14  CREATININE 0.84  CALCIUM 8.4*    PT/INR:  Lab Results  Component Value Date   INR 1.51 03/29/2018   INR 1.03 03/20/2018   ABG:  INR: Will add last result for INR, ABG once components are confirmed Will add last 4 CBG results once components are confirmed  Assessment/Plan:  1. CV - SR.  2.  Pulmonary - On room air. Encourage incentive spirometer. 3. ID-on Daptomycin 600 mg IV. Last day of this is 05/10/2018. 4. Substance abuse-on Suboxone, Buspar 5. Anemia-Last H and H  9.9 and 33.2 6. Will discuss with pharmacy about scheduled antibiotic and if able to change  Donielle M ZimmermanPA-C 05/07/2018,7:21 AM [phone]

## 2018-05-08 LAB — URINALYSIS, ROUTINE W REFLEX MICROSCOPIC
Bilirubin Urine: NEGATIVE
Glucose, UA: NEGATIVE mg/dL
Ketones, ur: NEGATIVE mg/dL
Nitrite: POSITIVE — AB
Protein, ur: NEGATIVE mg/dL
Specific Gravity, Urine: 1.011 (ref 1.005–1.030)
pH: 5 (ref 5.0–8.0)

## 2018-05-08 NOTE — Progress Notes (Signed)
Pt requests that PICC dressing not be changed again prior to DC home on Mon 9/16.  PICC to be removed prior to DC.  Will cont plan of care

## 2018-05-08 NOTE — Progress Notes (Addendum)
      [address]       [place] [id number]             [phone]      40 Days Post-Op Procedure(s) (LRB): TRICUSPID VALVE REPLACEMENT Using 27 mm Magna Ease Mitral Valve Pericardial Bioprosthesis (N/A) TRANSESOPHAGEAL ECHOCARDIOGRAM (TEE) (N/A) Subjective: C/O dysuria, started yesterday  Objective: Vital signs in last 24 hours: Temp:  [98.4 F (36.9 C)] 98.4 F (36.9 C) (09/14 0531) Pulse Rate:  [78] 78 (09/13 1355) Cardiac Rhythm: Normal sinus rhythm (09/13 2100) Resp:  [14] 14 (09/14 0531) BP: (94-98)/(55-57) 94/55 (09/14 0531) SpO2:  [98 %] 98 % (09/14 0531) Weight:  [75.4 kg] 75.4 kg (09/14 0500)  Hemodynamic parameters for last 24 hours:    Intake/Output from previous day: 09/13 0701 - 09/14 0700 In: 832 [P.O.:720; IV Piggyback:112] Out: -  Intake/Output this shift: No intake/output data recorded.  General appearance: alert, cooperative and no distress Heart: regular rate and rhythm Lungs: clear to auscultation bilaterally Abdomen: soft, non-tender; bowel sounds normal; no masses,  no organomegaly Extremities: no edema or calf tenderness Wound: incis healing well  Lab Results: Recent Labs    05/06/18 0500  WBC 7.0  HGB 9.9*  HCT 33.2*  PLT 193   BMET:  Recent Labs    05/06/18 0500  NA 137  K 4.2  CL 106  CO2 25  GLUCOSE 107*  BUN 14  CREATININE 0.84  CALCIUM 8.4*    PT/INR: No results for input(s): LABPROT, INR in the last 72 hours. ABG    Component Value Date/Time   PHART 7.361 03/29/2018 1911   HCO3 24.2 03/29/2018 1911   TCO2 27 03/30/2018 1707   ACIDBASEDEF 1.0 03/29/2018 1911   O2SAT 100.0 03/29/2018 1911   CBG (last 3)  No results for input(s): GLUCAP in the last 72 hours.  Meds Scheduled Meds: . alteplase  2 mg Intracatheter Once  . aspirin EC  325 mg Oral Daily  . buprenorphine-naloxone  1 tablet Sublingual Daily  . busPIRone  15 mg Oral BID  . Chlorhexidine Gluconate Cloth  6 each Topical Daily  .  docusate sodium  200 mg Oral Daily  . nicotine  21 mg Transdermal Daily  . pantoprazole  40 mg Oral QAC breakfast  . pneumococcal 23 valent vaccine  0.5 mL Intramuscular Tomorrow-1000  . sodium chloride flush  10-40 mL Intracatheter Q12H   Continuous Infusions: . sodium chloride 250 mL (05/07/18 1841)  . DAPTOmycin (CUBICIN)  IV 600 mg (05/07/18 1843)   PRN Meds:.sodium chloride, acetaminophen, bisacodyl **OR** bisacodyl, ondansetron **OR** ondansetron (ZOFRAN) IV, phenol, sodium chloride flush, traMADol, traZODone  Xrays No results found.  Assessment/Plan: S/P Procedure(s) (LRB): TRICUSPID VALVE REPLACEMENT Using 27 mm Magna Ease Mitral Valve Pericardial Bioprosthesis (N/A) TRANSESOPHAGEAL ECHOCARDIOGRAM (TEE) (N/A)  1 remains stable, BP runs mostly in 90's, sinus rhythm 2 will check UA for poss UTI 3 no fevers 4 conts current RX/TX- home soon  LOS: 49 days    Rowe ClackWayne E Gold 05/08/2018 Pager [phone]  patient examined and medical record reviewed,agree with above note. Kathlee Nationseter Van Trigt III 05/09/2018

## 2018-05-08 NOTE — Progress Notes (Signed)
Patient c/o of burning when she urinates and some blood tinged urine. Advised patient to drink water and offered cranberry juice. Patient declined cranberry juice but is drinking water. Advised patient to let the PA know when they round about her symptoms. Will continue to monitor

## 2018-05-09 MED ORDER — BUPRENORPHINE HCL-NALOXONE HCL 8-2 MG SL SUBL
1.0000 | SUBLINGUAL_TABLET | Freq: Every day | SUBLINGUAL | Status: DC
  Administered 2018-05-10: 1 via SUBLINGUAL
  Filled 2018-05-09: qty 1

## 2018-05-09 MED ORDER — SULFAMETHOXAZOLE-TRIMETHOPRIM 400-80 MG PO TABS
1.0000 | ORAL_TABLET | Freq: Two times a day (BID) | ORAL | Status: DC
  Administered 2018-05-09 – 2018-05-10 (×3): 1 via ORAL
  Filled 2018-05-09 (×4): qty 1

## 2018-05-09 NOTE — Progress Notes (Addendum)
      [address]       [place] [id number]             [phone]      41 Days Post-Op Procedure(s) (LRB): TRICUSPID VALVE REPLACEMENT Using 27 mm Magna Ease Mitral Valve Pericardial Bioprosthesis (N/A) TRANSESOPHAGEAL ECHOCARDIOGRAM (TEE) (N/A) Subjective: Feels well  Objective: Vital signs in last 24 hours: Temp:  [98.2 F (36.8 C)-98.3 F (36.8 C)] 98.3 F (36.8 C) (09/15 0624) Pulse Rate:  [71-83] 71 (09/15 0624) Cardiac Rhythm: Normal sinus rhythm (09/14 1900) Resp:  [16-18] 16 (09/15 0624) BP: (99-101)/(63-66) 99/63 (09/15 0624) SpO2:  [100 %] 100 % (09/15 0624)  Hemodynamic parameters for last 24 hours:    Intake/Output from previous day: 09/14 0701 - 09/15 0700 In: 137.9 [I.V.:25.9; IV Piggyback:112] Out: -  Intake/Output this shift: No intake/output data recorded.  General appearance: alert, cooperative and no distress Heart: regular rate and rhythm Lungs: mildly dim in lower fields Abdomen: benign Extremities: no edema or calf tenderness Wound: incis healing well  Lab Results: No results for input(s): WBC, HGB, HCT, PLT in the last 72 hours. BMET: No results for input(s): NA, K, CL, CO2, GLUCOSE, BUN, CREATININE, CALCIUM in the last 72 hours.  PT/INR: No results for input(s): LABPROT, INR in the last 72 hours. ABG    Component Value Date/Time   PHART 7.361 03/29/2018 1911   HCO3 24.2 03/29/2018 1911   TCO2 27 03/30/2018 1707   ACIDBASEDEF 1.0 03/29/2018 1911   O2SAT 100.0 03/29/2018 1911   CBG (last 3)  No results for input(s): GLUCAP in the last 72 hours.  Meds Scheduled Meds: . alteplase  2 mg Intracatheter Once  . aspirin EC  325 mg Oral Daily  . buprenorphine-naloxone  1 tablet Sublingual Daily  . busPIRone  15 mg Oral BID  . Chlorhexidine Gluconate Cloth  6 each Topical Daily  . docusate sodium  200 mg Oral Daily  . nicotine  21 mg Transdermal Daily  . pantoprazole  40 mg Oral QAC breakfast  . pneumococcal 23  valent vaccine  0.5 mL Intramuscular Tomorrow-1000  . sodium chloride flush  10-40 mL Intracatheter Q12H   Continuous Infusions: . sodium chloride Stopped (05/08/18 1530)  . DAPTOmycin (CUBICIN)  IV Stopped (05/08/18 1423)   PRN Meds:.sodium chloride, acetaminophen, bisacodyl **OR** bisacodyl, ondansetron **OR** ondansetron (ZOFRAN) IV, phenol, sodium chloride flush, traMADol, traZODone  Xrays No results found.  Assessment/Plan: S/P Procedure(s) (LRB): TRICUSPID VALVE REPLACEMENT Using 27 mm Magna Ease Mitral Valve Pericardial Bioprosthesis (N/A) TRANSESOPHAGEAL ECHOCARDIOGRAM (TEE) (N/A)  1 doing well, hemodyn stable in sinus rhythm, no fevers, O2 sats 100 on RA 2 she does appear to have UTI on UA- will get culture and start po bactrim 3 plan for discharge in am after last dose of IV abx if no changes   LOS: 50 days    Rowe ClackWayne E Gold 05/09/2018 Pager [phone]  patient examined and medical record reviewed,agree with above note. Kathlee Nationseter Van Trigt III 05/09/2018

## 2018-05-10 MED ORDER — BUSPIRONE HCL 15 MG PO TABS: 15.0000 mg | ORAL_TABLET | Freq: Two times a day (BID) | ORAL | Status: DC

## 2018-05-10 MED ORDER — BUSPIRONE HCL 15 MG PO TABS: 15.0000 mg | ORAL_TABLET | Freq: Two times a day (BID) | ORAL | 1 refills | Status: DC

## 2018-05-10 MED ORDER — ACETAMINOPHEN 325 MG PO TABS: 650.0000 mg | ORAL_TABLET | Freq: Four times a day (QID) | ORAL | Status: DC | PRN

## 2018-05-10 MED ORDER — NICOTINE 21 MG/24HR TD PT24: 21.0000 mg | MEDICATED_PATCH | Freq: Every day | TRANSDERMAL | 0 refills | Status: DC

## 2018-05-10 MED ORDER — SULFAMETHOXAZOLE-TRIMETHOPRIM 400-80 MG PO TABS: 1.0000 | ORAL_TABLET | Freq: Two times a day (BID) | ORAL | 0 refills | Status: DC

## 2018-05-10 MED ORDER — ASPIRIN 325 MG PO TBEC: 325.0000 mg | DELAYED_RELEASE_TABLET | Freq: Every day | ORAL | 0 refills | Status: DC

## 2018-05-10 MED ORDER — BUPRENORPHINE HCL-NALOXONE HCL 8-2 MG SL SUBL: 1.0000 | SUBLINGUAL_TABLET | Freq: Every day | SUBLINGUAL | Status: DC

## 2018-05-10 NOTE — Progress Notes (Addendum)
Patient given discharge instructions medication list and paper prescriptions. PICC/IV was removed by IV team after antibiotics given. Patient verbalized understanding of follow up appointments. Patient transported to exit with nursing staff. Will discharge home as ordered. [place]eves Dam.Dwain Huhn, Randall AnKristin Jessup RN

## 2018-05-10 NOTE — Discharge Instructions (Signed)
Discharge Instructions:  1. You may shower, please wash incisions daily with soap and water and keep dry.  If you wish to cover wounds with dressing you may do so but please keep clean and change daily.  No tub baths or swimming until incisions have completely healed.  If your incisions become red or develop any drainage please call our office at [phone]  3. Monitor your weight daily.. Please use the same scale and weigh at same time... If you gain 5-10 lbs in 48 hours with associated lower extremity swelling, please contact our office at [phone]  4. Fever of 101.5 for at least 24 hours with no source, please contact our office at [phone]  5. Activity- up as tolerated, please walk at least 3 times per day.  Avoid strenuous activity, no lifting, pushing, or pulling with your arms over 8-10 lbs for a minimum of 6 weeks  6. If any questions or concerns arise, please do not hesitate to contact our office at [phone]

## 2018-05-10 NOTE — Progress Notes (Addendum)
      [address]       [place] [id number]             [phone]        42 Days Post-Op Procedure(s) (LRB): TRICUSPID VALVE REPLACEMENT Using 27 mm Magna Ease Mitral Valve Pericardial Bioprosthesis (N/A) TRANSESOPHAGEAL ECHOCARDIOGRAM (TEE) (N/A)  Subjective: Patient without complaints this am.  Objective: Vital signs in last 24 hours: Temp:  [98 F (36.7 C)-98.2 F (36.8 C)] 98.2 F (36.8 C) (09/16 0513) Pulse Rate:  [83-89] 89 (09/15 2052) Cardiac Rhythm: Normal sinus rhythm (09/15 1900) Resp:  [13-21] 13 (09/16 0513) BP: (97-112)/(58-81) 105/81 (09/16 0513) SpO2:  [99 %-100 %] 100 % (09/16 0513)   Current Weight  05/08/18 75.4 kg      Intake/Output from previous day: 09/15 0701 - 09/16 0700 In: 120 [P.O.:120] Out: -    Physical Exam:  Cardiovascular: RRR, no murmur Pulmonary: Clear to auscultation bilaterally Extremities: No lower extremity edema. Wound: Clean, dry, well healed.  Lab Results: CBC: No results for input(s): WBC, HGB, HCT, PLT in the last 72 hours. BMET:  No results for input(s): NA, K, CL, CO2, GLUCOSE, BUN, CREATININE, CALCIUM in the last 72 hours.  PT/INR:  Lab Results  Component Value Date   INR 1.51 03/29/2018   INR 1.03 03/20/2018   ABG:  INR: Will add last result for INR, ABG once components are confirmed Will add last 4 CBG results once components are confirmed  Assessment/Plan:  1. CV - SR.  2.  Pulmonary - On room air. Encourage incentive spirometer. 3. ID-on Daptomycin 600 mg IV. Last day of this is 05/10/2018. 4. Substance abuse-on Suboxone, Buspar. I spoke with patient and she has gotten Suboxone from a clinic in the past. I am legally not permitted to prescribe it so she will have to obtain from clinic 5. Anemia-Last H and H  9.9 and 33.2 6. GU-being treated for UTI over the weekend. UC pending. On Bactrim.  Donielle M ZimmermanPA-C 05/10/2018,7:09 AM [phone]   Chart reviewed, patient  examined, agree with above. 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 Treating for UTI with course of Bactrim.

## 2018-05-10 NOTE — Care Management Note (Signed)
Case Management Note Donn PieriniKristi Willim Turnage RN, BSN Unit 4E- RN Care Coordinator  [phone]  Patient Details  Name: [name] MRN: [id number] Date of Birth: 11/10/1980  Subjective/Objective:   PT admitted with endocarditis, s/p TVR and 6 weeks coarse of IV abx. Last dose today                 Action/Plan: PTA pt was homeless, now reports that she has a place to go, and is looking forward to picking up daughter. Pt reports that [facility] [address] [place] in past and plans to re-establish there for on going Suboxone needs. CM reviewed other discharge medications (unable to assist with any controled  Substances), pt reports that any assistance available would be appreciated - CM provided MATCH letter to pt at bedside along with list of pharmacies to use. Discussed program including cost $3 copay per script and one time use on discharge within 12 mo period. Pt voiced understanding and appreciation for assistance.   Expected Discharge Date:  05/10/18               Expected Discharge Plan:  Home/Self Care  In-House Referral:  Clinical Social Work  Discharge planning Services  CM Consult, MATCH Program, Medication Assistance  Post Acute Care Choice:  NA Choice offered to:  NA  DME Arranged:    DME Agency:     HH Arranged:    HH Agency:     Status of Service:  Completed, signed off  If discussed at [employer] Length of Stay Meetings, dates discussed:    Discharge Disposition: home/self care   Additional Comments:  Darrold SpanWebster, Zaydn Gutridge Hall, RN 05/10/2018, 11:18 AM

## 2018-05-11 ENCOUNTER — Telehealth: Payer: Self-pay | Admitting: Family Medicine

## 2018-05-11 DIAGNOSIS — N3000 Acute cystitis without hematuria: Secondary | ICD-10-CM

## 2018-05-11 LAB — URINE CULTURE: Culture: >=100000 — AB

## 2018-05-11 MED ORDER — CEPHALEXIN 500 MG PO CAPS: 500.0000 mg | ORAL_CAPSULE | Freq: Two times a day (BID) | ORAL | 0 refills | Status: DC

## 2018-05-11 NOTE — Telephone Encounter (Signed)
Received results of urine culture and inbox.  Urine grew E. coli resistant to current therapy.  Called patient started on Keflex for 7 days.  Patient denies any signs or symptoms at this time.  Scheduled follow-up with me in clinic.  Patient reports she has not been unable to get her Suboxone for today.  She reports the clinic expenses are too high where she was referred to.  She has not yet applied for Medicaid.  She reports she started an application in the hospital but did not complete this.  Routing to both social work and Conservation officer, natureJackie for assistance in [employer] application and Software engineerfinancial assistance application.  Patient currently does not work.  Patient with recent prolonged admission for endocarditis.  Patient discharged on Suboxone therapy by PA Joycelyn ManZimmerman. She plans to call Suboxone clinic to schedule appointment for today.   Terisa Starrarina Brown, MD  Family Medicine Teaching Service

## 2018-05-12 ENCOUNTER — Telehealth: Payer: Self-pay

## 2018-05-12 ENCOUNTER — Telehealth: Payer: Self-pay | Admitting: Family Medicine

## 2018-05-12 NOTE — Telephone Encounter (Signed)
Pt came in office requesting a 6 day Rx of [facility] Best phone # to contact [phone].

## 2018-05-12 NOTE — Telephone Encounter (Signed)
As I discussed with the patient yesterday, I do not have my X waiver and cannot prescribe this. Called on call [facility] PA. Discussed at length, therapy started by Dr. Caleb PoppNettey per notes. Will route message to Dr. Caleb PoppNettey to determine best course of action as patient was discharged without supply.

## 2018-05-12 NOTE — Telephone Encounter (Signed)
Pt called back to check status.  Informed of message. Danni Leabo, Maryjo RochesterJessica Dawn, CMA

## 2018-05-12 NOTE — Telephone Encounter (Signed)
Patient contacted per Gershon CraneWayne Gold, PA to give another antibiotic for UTI due to antibiotic she was currently taking was resistant.  Patient stated that her primary care physician had already called a prescription in, Keflex and she was taking it.  Kerr[place]Wayne, GeorgiaPA was notified and aware.   While on the phone she did ask if I could help her get a prescription for suboxone.  She stated that she was currently in the hospital trying to get a prescription.  I advised that I cannot get that for her.  She acknowledged receipt.

## 2018-05-13 ENCOUNTER — Telehealth: Payer: Self-pay | Admitting: Family Medicine

## 2018-05-13 ENCOUNTER — Ambulatory Visit (INDEPENDENT_AMBULATORY_CARE_PROVIDER_SITE_OTHER): Payer: Medicaid Other | Admitting: Cardiovascular Disease

## 2018-05-13 ENCOUNTER — Encounter: Payer: Self-pay | Admitting: Cardiovascular Disease

## 2018-05-13 VITALS — BP 110/67 | HR 83 | Ht 67.0 in | Wt 166.4 lb

## 2018-05-13 DIAGNOSIS — F1120 Opioid dependence, uncomplicated: Secondary | ICD-10-CM | POA: Diagnosis not present

## 2018-05-13 DIAGNOSIS — Z954 Presence of other heart-valve replacement: Secondary | ICD-10-CM | POA: Diagnosis not present

## 2018-05-13 NOTE — Telephone Encounter (Signed)
Received a call from Dr. Duke Salviaandolph cardiologist regarding this patient Suboxone.  She was recently admitted to the hospital and then transferred to CVTS service.  She was discharged on 05/10/2018 without a Suboxone prescription and does not see her pain management clinic until 05/18/2018.  Dr. Duke Salviaandolph is not a suboxone prescriber.  Patient is unfortunately already discharged from the hospital so she would need to have a 6-day supply called in [facility].  Discussed with attending Dr. Manson PasseyBrown who is also the patient's PCP.  Dr. Manson PasseyBrown is not a Suboxone prescriber either. Unfortunately the internal medicine residency clinic would not also be able to as they have never seen this patient but recommendations from Dr. Oswaldo DoneVincent include: having the patient wait for her appointment on 05/18/18, or if she is having post op pain then treating appropriately with opiates or lastly directing her to [facility] Drug Services. Will forward to PCP as patient is awaiting a call for further instructions.
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
## 2018-05-13 NOTE — Patient Instructions (Signed)
Medication Instructions:  Your physician recommends that you continue on your current medications as directed. Please refer to the Current Medication list given to you today.  Labwork: NONE  Testing/Procedures: Your physician has requested that you have an echocardiogram. Echocardiography is a painless test that uses sound waves to create images of your heart. It provides your doctor with information about the size and shape of your heart and how well your heart's chambers and valves are working. This procedure takes approximately one hour. There are no restrictions for this procedure. [facility] AT [address]  Follow-Up: Your physician wants you to follow-up in: 4 MONTHS  You will receive a reminder letter in the mail two months in advance. If you don't receive a letter, please call our office to schedule the follow-up appointment.  If you need a refill on your cardiac medications before your next appointment, please call your pharmacy.

## 2018-05-13 NOTE — Telephone Encounter (Signed)
Called patient.  Discussed options including waiting for appointment and reaching out to alcohol drug services.  She reports her appointment for Suboxone is with alcohol and drug services.  She has not called them since discharge when she is get set up the appointment which is next week.  Recommend she call them again to see if they can get her in at a sooner time.  All questions were answered.

## 2018-05-17 ENCOUNTER — Other Ambulatory Visit (HOSPITAL_COMMUNITY): Payer: Self-pay

## 2018-05-19 ENCOUNTER — Inpatient Hospital Stay: Payer: Medicaid Other | Admitting: Internal Medicine

## 2018-05-23 ENCOUNTER — Encounter: Payer: Self-pay | Admitting: Family Medicine

## 2018-05-24 ENCOUNTER — Ambulatory Visit (HOSPITAL_COMMUNITY): Payer: Medicaid Other | Attending: Cardiovascular Disease

## 2018-05-24 ENCOUNTER — Other Ambulatory Visit: Payer: Self-pay

## 2018-05-24 ENCOUNTER — Ambulatory Visit: Payer: Self-pay | Admitting: Family Medicine

## 2018-05-24 DIAGNOSIS — Z954 Presence of other heart-valve replacement: Secondary | ICD-10-CM

## 2018-05-24 DIAGNOSIS — Z48812 Encounter for surgical aftercare following surgery on the circulatory system: Secondary | ICD-10-CM | POA: Insufficient documentation

## 2018-05-24 DIAGNOSIS — Z953 Presence of xenogenic heart valve: Secondary | ICD-10-CM | POA: Insufficient documentation

## 2018-05-24 DIAGNOSIS — I517 Cardiomegaly: Secondary | ICD-10-CM | POA: Insufficient documentation

## 2018-05-26 ENCOUNTER — Telehealth (HOSPITAL_COMMUNITY): Payer: Self-pay

## 2018-05-26 NOTE — Telephone Encounter (Signed)
Attempted to contact patient in regards to Insurance and follow up in regards to IV treatments - lm on vm

## 2018-06-01 ENCOUNTER — Other Ambulatory Visit: Payer: Self-pay | Admitting: Surgery

## 2018-06-01 DIAGNOSIS — I368 Other nonrheumatic tricuspid valve disorders: Principal | ICD-10-CM

## 2018-06-01 DIAGNOSIS — I079 Rheumatic tricuspid valve disease, unspecified: Secondary | ICD-10-CM

## 2018-06-02 ENCOUNTER — Ambulatory Visit (INDEPENDENT_AMBULATORY_CARE_PROVIDER_SITE_OTHER): Payer: Self-pay | Admitting: Surgery

## 2018-06-02 ENCOUNTER — Encounter: Payer: Self-pay | Admitting: Surgery

## 2018-06-02 ENCOUNTER — Ambulatory Visit
Admission: RE | Admit: 2018-06-02 | Discharge: 2018-06-02 | Disposition: A | Discharge status: Home / Self Care | Payer: Self-pay | Source: Ambulatory Visit | Attending: Surgery | Admitting: Surgery

## 2018-06-02 ENCOUNTER — Other Ambulatory Visit: Payer: Self-pay

## 2018-06-02 ENCOUNTER — Telehealth (HOSPITAL_COMMUNITY): Payer: Self-pay

## 2018-06-02 VITALS — BP 111/69 | HR 60 | Resp 16 | Ht 67.0 in | Wt 174.8 lb

## 2018-06-02 DIAGNOSIS — Z954 Presence of other heart-valve replacement: Secondary | ICD-10-CM

## 2018-06-02 DIAGNOSIS — I079 Rheumatic tricuspid valve disease, unspecified: Secondary | ICD-10-CM

## 2018-06-02 DIAGNOSIS — I368 Other nonrheumatic tricuspid valve disorders: Principal | ICD-10-CM

## 2018-06-02 NOTE — Progress Notes (Signed)
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 The patient's early postoperative recovery while in the hospital was notable for an uncomplicated postoperative course.  She remained in the hospital 6 weeks postoperatively to receive intravenous antibiotics since there were no other options for her to receive intravenous antibiotics.. Since hospital discharge the patient reports that she has been feeling well.  She denies any fever or chills.  She says that she has not been using any drugs and is taking her Suboxone.   Current Outpatient Medications  Medication Sig Dispense Refill  . aspirin EC 325 MG EC tablet Take 1 tablet (325 mg total) by mouth daily. 30 tablet 0  . Buprenorphine HCl-Naloxone HCl (SUBOXONE SL) Place 10 mg under the tongue daily.     No current facility-administered medications for this visit.     Physical Exam: BP 111/69 (BP Location: Right Arm, Patient Position: Sitting, Cuff Size: Large)   Pulse 60   Resp 16   Ht 5\' 7"  (1.702 m)   Wt 174 lb 12.8 oz (79.3 kg)   SpO2 97%   BMI 27.38 kg/m  She looks well. Cardiac exam shows a regular rate and rhythm with normal heart sounds.  There is no murmur. Lung exam is clear. The chest incision is healed and looks well.  Sternum is stable. There is no peripheral edema  Diagnostic Tests:  CLINICAL DATA:  Status post tricuspid valve surgery.  EXAM: CHEST - 2 VIEW  COMPARISON:  Radiograph of March 31, 2018. CT scan of March 24, 2018.  FINDINGS: The heart size and mediastinal contours are within normal limits. Sternotomy wires are noted. No pneumothorax or pleural effusion is noted. Left lung is clear. Right basilar opacity noted on prior exam is nearly resolved, with small amount of residual density remaining most consistent with scarring.  Oval-shaped density is seen in right midlung which may represent postinfectious scarring or residual inflammation. The visualized skeletal structures are unremarkable.  IMPRESSION: Significantly improved right basilar opacity is noted compared to prior exam, with small residual density noted consistent with scarring. Oval-shaped density seen in right midlung which may represent postinfectious scarring or residual inflammation.   Electronically Signed   By: Lupita Raider, M.D.   On: 06/02/2018 12:56       *[facility]*                        [address]                        [place], [place] [address]                            [phone]  ------------------------------------------------------------------- Transthoracic Echocardiography  Patient:    Tiffany Salazar, Tiffany Salazar MR #:       [id number] Study Date: 05/24/2018 Gender:     F Age:        37 Height:     170.2 cm Weight:     75.5 kg BSA:        1.9 m^2 Pt. Status: Room:   SONOGRAPHER  Randa Evens Will  PERFORMING   [facility], Outpatient  ATTENDING    Chilton Si, MD  ORDERING     Chilton Si, MD  REFERRING    Elmarie Shiley  Duke Salvia, MD  cc:  ------------------------------------------------------------------- LV EF: 55% -   60%  ------------------------------------------------------------------- Indications:      (Z95.4).  ------------------------------------------------------------------- History:   PMH:  S/p Tricuspid valve replacement (27mm [facility] Ease pericardial valve). Heroin addiction. Acquired from the patient and from the patient&'s chart.  ------------------------------------------------------------------- Study Conclusions  - Left ventricle: The cavity size was normal. Systolic function was   normal. The estimated ejection fraction was in the range of 55%   to 60%. Wall motion was normal; there were no regional wall   motion abnormalities. - Aortic valve: There was no  significant regurgitation. - Mitral valve: There was trivial regurgitation. - Left atrium: The atrium was moderately dilated. - Right ventricle: Systolic function was normal. - Right atrium: The atrium was moderately to severely dilated. - Atrial septum: No defect or patent foramen ovale was identified. - Tricuspid valve: Prior procedures included surgical repair. A 2.7   cmbioprosthesis was present. There was trivial regurgitation. - Pulmonic valve: There was trivial regurgitation. - Pulmonary arteries: PA peak pressure: 28 mm Hg (S).  Impressions:  - S/P TV replacement. Valve appears well seated, trivial TR   centrally. EF normal. No other significant valve disease.  ------------------------------------------------------------------- Study data:  Comparison was made to the study of 03/29/2018.  Study status:  Routine.  Procedure:  The patient reported no pain pre or post test. Transthoracic echocardiography for left ventricular function evaluation and for assessment of valvular function. Image quality was adequate.  Study completion:  There were no complications.          Transthoracic echocardiography.  M-mode, complete 2D, spectral Doppler, and color Doppler.  Birthdate: Patient birthdate: October 18, 1980.  Age:  Patient is 37 yr old.  Sex: Gender: female.    BMI: 26.1 kg/m^2.  Blood pressure:     110/67 Patient status:  Outpatient.  Study date:  Study date: 05/24/2018. Study time: 03:11 PM.  Location:  [facility]  -------------------------------------------------------------------  ------------------------------------------------------------------- Left ventricle:  The cavity size was normal. Systolic function was normal. The estimated ejection fraction was in the range of 55% to 60%. Wall motion was normal; there were no regional wall motion abnormalities.  ------------------------------------------------------------------- Aortic valve:   Trileaflet; normal  thickness leaflets. Mobility was not restricted.  Doppler:  Transvalvular velocity was within the normal range. There was no stenosis. There was no significant regurgitation.  ------------------------------------------------------------------- Aorta:  Aortic root: The aortic root was normal in size.  ------------------------------------------------------------------- Mitral valve:   Structurally normal valve.   Mobility was not restricted.  Doppler:  Transvalvular velocity was within the normal range. There was no evidence for stenosis. There was trivial regurgitation.    Valve area by pressure half-time: 3.93 cm^2. Indexed valve area by pressure half-time: 2.07 cm^2/m^2.    Peak gradient (D): 5 mm Hg.  ------------------------------------------------------------------- Left atrium:  The atrium was moderately dilated.  ------------------------------------------------------------------- Atrial septum:  No defect or patent foramen ovale was identified.   ------------------------------------------------------------------- Right ventricle:  The cavity size was normal. Wall thickness was normal. Systolic function was normal.  ------------------------------------------------------------------- Pulmonic valve:    The valve appears to be grossly normal. Doppler:  Transvalvular velocity was within the normal range. There was no evidence for stenosis. There was trivial regurgitation.  ------------------------------------------------------------------- Tricuspid valve:   Normal thickness leaflets. Prior procedures included surgical repair. A 2.7 cmbioprosthesis was present. Doppler:  Transvalvular velocity was within the normal range. There was trivial regurgitation.  ------------------------------------------------------------------- Pulmonary artery:   The main pulmonary artery  was normal-sized. Systolic pressure was within the normal  range.  ------------------------------------------------------------------- Right atrium:  The atrium was moderately to severely dilated.  ------------------------------------------------------------------- Pericardium:  There was no pericardial effusion.  ------------------------------------------------------------------- Systemic veins: Inferior vena cava: The vessel was dilated. The respirophasic diameter changes were blunted (< 50%), consistent with elevated central venous pressure.  ------------------------------------------------------------------- Measurements   Left ventricle                         Value          Reference  LV ID, ED, PLAX chordal        (H)     55    mm       43 - 52  LV ID, ES, PLAX chordal        (H)     40    mm       23 - 38  LV fx shortening, PLAX chordal (L)     27    %        >=29  LV PW thickness, ED                    9     mm       ----------  IVS/LV PW ratio, ED                    1              <=1.3  Stroke volume, 2D                      60    ml       ----------  Stroke volume/bsa, 2D                  32    ml/m^2   ----------  LV e&', lateral                         10.9  cm/s     ----------  LV E/e&', lateral                       10.46          ----------  LV e&', medial                          5.77  cm/s     ----------  LV E/e&', medial                        19.76          ----------  LV e&', average                         8.34  cm/s     ----------  LV E/e&', average                       13.68          ----------    Ventricular septum                     Value          Reference  IVS thickness, ED  9     mm       ----------    LVOT                                   Value          Reference  LVOT ID, S                             20    mm       ----------  LVOT area                              3.14  cm^2     ----------  LVOT ID                                20    mm       ----------  LVOT peak velocity,  S                  82.1  cm/s     ----------  LVOT mean velocity, S                  59.1  cm/s     ----------  LVOT VTI, S                            19.1  cm       ----------  Stroke volume (SV), LVOT DP            60    ml       ----------  Stroke index (SV/bsa), LVOT DP         31.5  ml/m^2   ----------    Aorta                                  Value          Reference  Aortic root ID, ED                     28    mm       ----------  Ascending aorta ID, A-P, S             30    mm       ----------    Left atrium                            Value          Reference  LA ID, A-P, ES                         39    mm       ----------  LA ID/bsa, A-P                         2.05  cm/m^2   <=2.2  LA volume, S  61.3  ml       ----------  LA volume/bsa, S                       32.2  ml/m^2   ----------  LA volume, ES, 1-p A4C                 49.6  ml       ----------  LA volume/bsa, ES, 1-p A4C             26.1  ml/m^2   ----------  LA volume, ES, 1-p A2C                 67.3  ml       ----------  LA volume/bsa, ES, 1-p A2C             35.4  ml/m^2   ----------    Mitral valve                           Value          Reference  Mitral E-wave peak velocity            114   cm/s     ----------  Mitral A-wave peak velocity            49    cm/s     ----------  Mitral deceleration time               190   ms       150 - 230  Mitral pressure half-time              56    ms       ----------  Mitral peak gradient, D                5     mm Hg    ----------  Mitral E/A ratio, peak                 2.3            ----------  Mitral valve area, PHT, DP             3.93  cm^2     ----------  Mitral valve area/bsa, PHT, DP         2.07  cm^2/m^2 ----------    Pulmonary arteries                     Value          Reference  PA pressure, S, DP                     28    mm Hg    <=30    Tricuspid valve                        Value          Reference  Tricuspid regurg peak  velocity         225   cm/s     ----------  Tricuspid peak RV-RA gradient          20    mm Hg    ----------    Right atrium  Value          Reference  RA ID, S-I, ES, A4C                    45.7  mm       34 - 49  RA area, ES, A4C               (H)     19.7  cm^2     8.3 - 19.5  RA volume, ES, A/L                     67.4  ml       ----------  RA volume/bsa, ES, A/L                 35.4  ml/m^2   ----------    Systemic veins                         Value          Reference  Estimated CVP                          8     mm Hg    ----------    Right ventricle                        Value          Reference  RV pressure, S, DP                     28    mm Hg    <=30  Legend: (L)  and  (H)  mark values outside specified reference range.  ------------------------------------------------------------------- Prepared and Electronically Authenticated by  Jodelle Red 2019-09-30T17:41:59   Impression:  Overall I think she is making a great recovery following her surgery.  She will continue aspirin 325 mg for 3 months postoperatively and then will decrease it to 81 mg daily.  I encouraged her to continue abstaining from drug use and again discussed the ramifications of further intravenous drug use and risk of recurrent prosthetic valve endocarditis.  I asked her not to lift anything heavier than 10 pounds for 3 months postoperatively.  I have personally reviewed her echocardiogram from 05/24/2018 and the prosthetic tricuspid valve is functioning normally.  There is trivial central regurgitation as expected.  Plan:  She is going to follow-up in the infectious disease clinic in the near future and will then continue to follow-up with cardiology.   Alleen Borne, MD [facility] [phone]

## 2018-06-02 NOTE — Telephone Encounter (Signed)
2nd attempt to contact patient in regards to cardiac Rehab and insurance - lm on vm. Sending letter.

## 2018-06-09 ENCOUNTER — Telehealth (HOSPITAL_COMMUNITY): Payer: Self-pay

## 2018-06-09 NOTE — Telephone Encounter (Signed)
Called and spoke with patient in regards to insurance and patient stated she is in the process of getting Medicaid. Will follow up with patient in a couple of weeks.

## 2018-06-11 ENCOUNTER — Encounter: Payer: Self-pay | Admitting: Family Medicine

## 2018-06-11 ENCOUNTER — Other Ambulatory Visit: Payer: Self-pay

## 2018-06-11 ENCOUNTER — Ambulatory Visit (INDEPENDENT_AMBULATORY_CARE_PROVIDER_SITE_OTHER): Payer: Medicaid Other | Admitting: Family Medicine

## 2018-06-11 ENCOUNTER — Other Ambulatory Visit (HOSPITAL_COMMUNITY)
Admission: RE | Admit: 2018-06-11 | Discharge: 2018-06-11 | Disposition: A | Discharge status: Home / Self Care | Payer: Medicaid Other | Source: Ambulatory Visit | Attending: Family Medicine | Admitting: Family Medicine

## 2018-06-11 VITALS — BP 98/62 | HR 63 | Temp 98.4°F | Ht 67.0 in | Wt 178.2 lb

## 2018-06-11 DIAGNOSIS — N3000 Acute cystitis without hematuria: Secondary | ICD-10-CM

## 2018-06-11 DIAGNOSIS — B182 Chronic viral hepatitis C: Secondary | ICD-10-CM

## 2018-06-11 DIAGNOSIS — Z113 Encounter for screening for infections with a predominantly sexual mode of transmission: Secondary | ICD-10-CM | POA: Diagnosis not present

## 2018-06-11 DIAGNOSIS — R3915 Urgency of urination: Secondary | ICD-10-CM

## 2018-06-11 DIAGNOSIS — R768 Other specified abnormal immunological findings in serum: Secondary | ICD-10-CM | POA: Diagnosis not present

## 2018-06-11 DIAGNOSIS — A599 Trichomoniasis, unspecified: Secondary | ICD-10-CM

## 2018-06-11 DIAGNOSIS — F1990 Other psychoactive substance use, unspecified, uncomplicated: Secondary | ICD-10-CM

## 2018-06-11 DIAGNOSIS — Z124 Encounter for screening for malignant neoplasm of cervix: Secondary | ICD-10-CM | POA: Insufficient documentation

## 2018-06-11 LAB — POCT URINALYSIS DIP (MANUAL ENTRY)
Bilirubin, UA: NEGATIVE
Glucose, UA: NEGATIVE mg/dL
Ketones, POC UA: NEGATIVE mg/dL
Nitrite, UA: NEGATIVE
Protein Ur, POC: 30 mg/dL — AB
Spec Grav, UA: 1.02 (ref 1.010–1.025)
Urobilinogen, UA: 0.2 E.U./dL
pH, UA: 6 (ref 5.0–8.0)

## 2018-06-11 LAB — POCT WET PREP (WET MOUNT): Clue Cells Wet Prep Whiff POC: NEGATIVE

## 2018-06-11 MED ORDER — CEPHALEXIN 500 MG PO CAPS: 500.0000 mg | ORAL_CAPSULE | Freq: Two times a day (BID) | ORAL | 0 refills | Status: DC

## 2018-06-11 MED ORDER — METRONIDAZOLE 500 MG PO TABS: 2000.0000 mg | ORAL_TABLET | Freq: Once | ORAL | 0 refills | Status: AC

## 2018-06-11 MED ORDER — ONDANSETRON 4 MG PO TBDP: 4.0000 mg | ORAL_TABLET | Freq: Three times a day (TID) | ORAL | 0 refills | Status: DC | PRN

## 2018-06-11 NOTE — Progress Notes (Signed)
Patient Name: Tiffany Salazar Date of Birth: 28-Aug-1980 Date of Visit: 06/11/18 PCP: Westley Chandler, MD  Chief Complaint: Concerns about urinary tract infection, questions about trichomonas, follow-up from hospital stay  Subjective: Tiffany Salazar is a pleasant 37 y.o. year old woman with a history of substance use disorder on Suboxone therapy, tricuspid valve endocarditis status post valve replacement, and chronic hepatitis C with an elevated viral load today to establish care and with the above concerns.   The patient reports a several day history of urinary urgency.  She has a history of frequent UTIs.  She frequently gets urinary tract infections after intercourse.  During her pregnancy she had what appears to be an episode of pyelonephritis.  Denies fevers, dysuria, hematuria.  Does report some vaginal discharge.   Patient reports she has been completely clean of any cocaine or heroin since her admission in July.  She stopped marijuana last week.  She is applying for school counselor programs.  She continues to smoke 1 pack of cigarettes per day.  She reports the craving for cigarettes is intense.  The Suboxone is helping considerably with her cravings for opiates.  Her Suboxone prescriber has also started her on Lexapro.  The patient reports she recently got engaged (fiance [place]). She is excited. She is sexually active with [name] only- reports reduction in libido.   In terms of contraception the patient is not interested in preventing pregnancy at this time.  She reports her and her current fianc would like to have a child together. ROS:  ROS Negative of except for as above I have reviewed the patient's medical, surgical, family, and social history as appropriate.   Vitals:   06/11/18 0955  BP: 98/62  Pulse: 63  Temp: 98.4 F (36.9 C)  SpO2: 98%   Filed Weights   06/11/18 0955  Weight: 178 lb 3.2 oz (80.8 kg)   HEENT: Sclera anicteric. Dentition is moderate. Appears well  hydrated. Neck: Supple Cardiac: Regular rate and rhythm. S1 click, S2 present. No murmurs.  Lungs: Clear bilaterally to ascultation.  Abdomen: Normoactive bowel sounds. No tenderness to deep or light palpation. No rebound or guarding.  Extremities: Warm, well perfused without edema.  Skin: Warm, dry Psych: Pleasant and appropriate  GU: Normal appearing vaginal examination. White discharge present. Cervical mucosa appears normal.   Cecil was seen today for annual exam.  Diagnoses and all orders for this visit:  Routine screening for STI (sexually transmitted infection) -     HIV antibody (with reflex) -     RPR -     POCT Wet Prep [facility]) -     Cervicovaginal ancillary only  Screening for malignant neoplasm of cervix -     Cytology - PAP([place])  Chronic hepatitis C without hepatic coma (HCC), genotype not obtained due to low viral load. Patient missed follow up with infectious disease.  -     Ambulatory referral to Infectious Disease -     US ABDOMEN RUQ W/ELASTOGRAPHY; Future  Urgency of urination -     Urine Culture -     POCT urinalysis dipstick  Acute cystitis without hematuria -     cephALEXin (KEFLEX) 500 MG capsule; Take 1 capsule (500 mg total) by mouth 2 (two) times daily. - If positive, may benefit from post-coital prophylaxis.   Trichomonas infection -     metroNIDAZOLE (FLAGYL) 500 MG tablet; Take 4 tablets (2,000 mg total) by mouth once for 1 dose. -  ondansetron (ZOFRAN ODT) 4 MG disintegrating tablet; Take 1 tablet (4 mg total) by mouth every 8 (eight) hours as needed for nausea or vomiting.  History of Tricuspid Replacement, will need amoxicillin for dental procedures.  Continue ASA 325 mg then reduce to 81 mg.   HCM Pap done today Needs Tdap  Recommended taking a prenatal vitamin every day she is sexually active and not using contraception  Terisa Starr, MD  [facility] Medicine Teaching Service

## 2018-06-11 NOTE — Patient Instructions (Signed)
On November 5th, you can reduce your aspirin dose to 81 mg daily (a baby aspirin)    It was wonderful to see you today.  Thank you for choosing [facility].   Please call [phone] with any questions about today's appointment.  Please be sure to schedule follow up at the front  desk before you leave today.   Terisa Starr, MD  Family Medicine

## 2018-06-12 ENCOUNTER — Telehealth: Payer: Self-pay | Admitting: Family Medicine

## 2018-06-12 LAB — HIV ANTIBODY (ROUTINE TESTING W REFLEX): HIV Screen 4th Generation wRfx: NONREACTIVE

## 2018-06-12 LAB — RPR: RPR Ser Ql: NONREACTIVE

## 2018-06-12 NOTE — Telephone Encounter (Signed)
Nursing- please call patient and let her know her blood (HIV and syphilis) testing were negative- this is good news.

## 2018-06-14 ENCOUNTER — Telehealth: Payer: Self-pay

## 2018-06-14 DIAGNOSIS — A599 Trichomoniasis, unspecified: Secondary | ICD-10-CM

## 2018-06-14 LAB — URINE CULTURE

## 2018-06-14 LAB — CERVICOVAGINAL ANCILLARY ONLY
Chlamydia: NEGATIVE
Neisseria Gonorrhea: NEGATIVE

## 2018-06-14 MED ORDER — METRONIDAZOLE 500 MG PO TABS: 500.0000 mg | ORAL_TABLET | Freq: Two times a day (BID) | ORAL | 0 refills | Status: DC

## 2018-06-14 NOTE — Telephone Encounter (Signed)
Patient has called again with this request.  Ples Specter, RN [facility] RN)

## 2018-06-14 NOTE — Telephone Encounter (Signed)
Attempted to call patient. Left message with generic voicemail indicating she should return call.  Please call her with below information:   A new prescription for metronidazole has been sent to her pharmacy.  This is to be taken twice daily without any alcohol for a total of 7 days.  This was sent to her preferred pharmacy.  Please let the patient know that her urine culture did indeed come back positive I recommend she complete a course of Keflex.  At her upcoming appointment we need to discuss postcoital prophylaxis.

## 2018-06-14 NOTE — Telephone Encounter (Signed)
Patient aware. Alisa Brake, RN ([facility] RN)  

## 2018-06-14 NOTE — Telephone Encounter (Signed)
Patient called nurse line. Stated she vomited within one hour of taking Metronidazole for treatment on Friday. Requests a prescription that she can take over several days instead of at one time.  Please call back after 12 if you have questions. [phone].  Made her aware of HIV and syphilis results.   Ples Specter, RN [facility] RN)

## 2018-06-15 MED ORDER — METRONIDAZOLE 500 MG PO TABS: 500.0000 mg | ORAL_TABLET | Freq: Two times a day (BID) | ORAL | 0 refills | Status: DC

## 2018-06-15 NOTE — Telephone Encounter (Signed)
Pt called back, she wanted the Med called into [facility].  Resent as requested.  Canceled @ [employer]. Natalynn Pedone, Maryjo Rochester, CMA

## 2018-06-15 NOTE — Addendum Note (Signed)
Addended by: Jone Baseman D on: 06/15/2018 03:45 PM   Modules accepted: Orders

## 2018-06-16 ENCOUNTER — Encounter: Payer: Self-pay | Admitting: Family Medicine

## 2018-06-16 LAB — CYTOLOGY - PAP
Diagnosis: NEGATIVE
HPV: NOT DETECTED

## 2018-06-17 ENCOUNTER — Ambulatory Visit (HOSPITAL_COMMUNITY): Payer: Medicaid Other

## 2018-07-01 ENCOUNTER — Telehealth (HOSPITAL_COMMUNITY): Payer: Self-pay

## 2018-07-01 NOTE — Telephone Encounter (Signed)
Attempted to contact pt to follow up with the status of her Medicaid, unable to leave VM. Mail box full  Closed referral

## 2018-07-05 ENCOUNTER — Inpatient Hospital Stay: Payer: Self-pay | Admitting: Internal Medicine

## 2018-07-14 IMAGING — US US MFM OB DETAIL+14 WK
1 series · 14 of 28 positions shown · non-contrast
Comparison: none

[Series 1: us mfm ob detail+14 wk · 103 acquisitions, 14 frames shown]
[im 4/103]
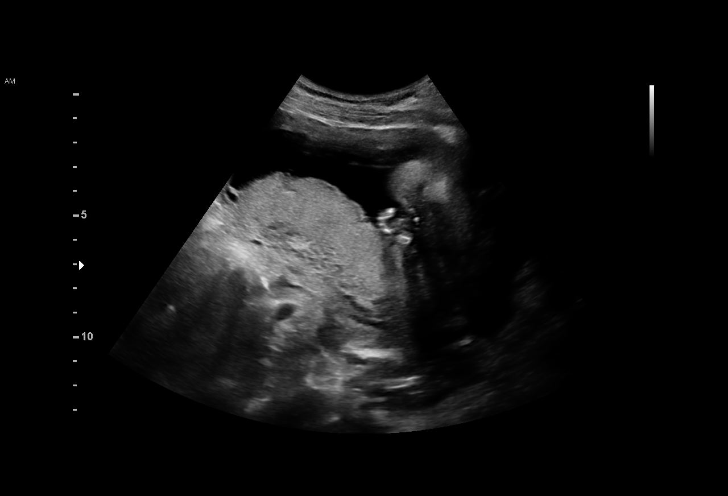
[im 12/103]
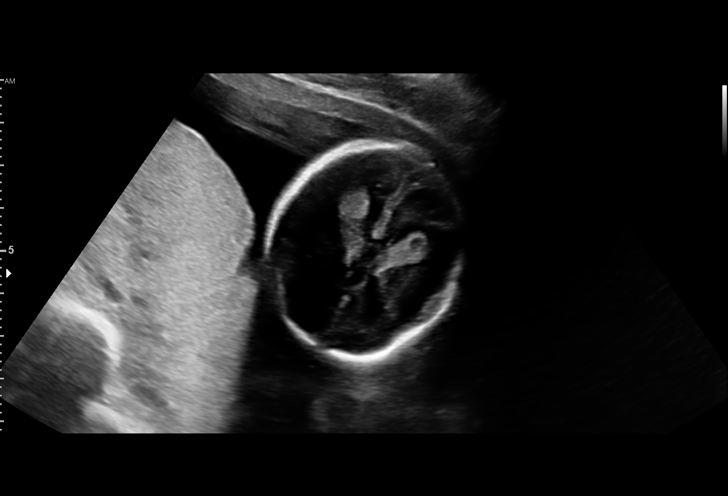
[im 19/103]
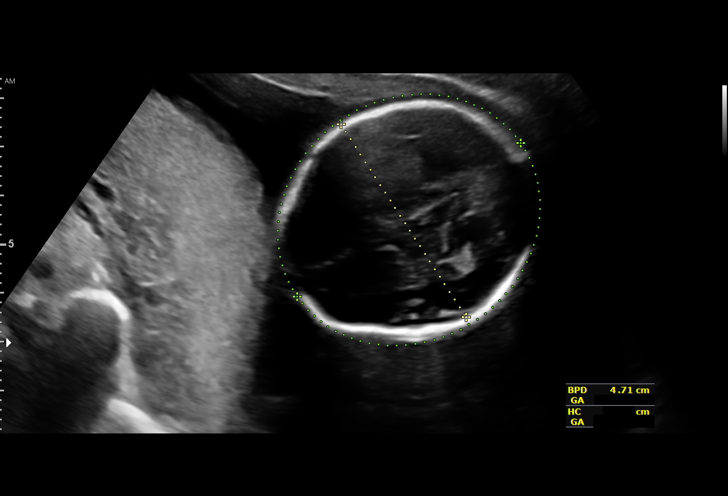
[im 27/103]
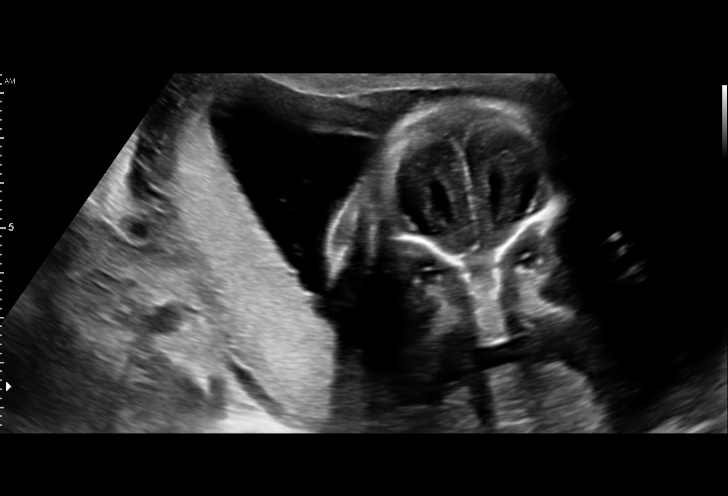
[im 35/103]
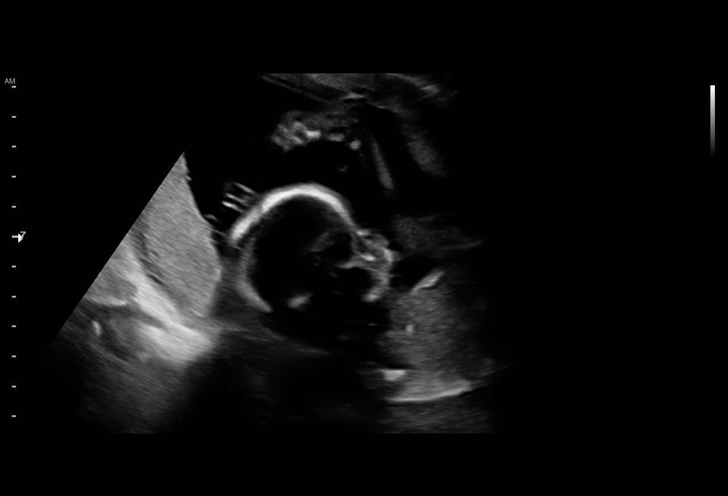
[im 42/103]
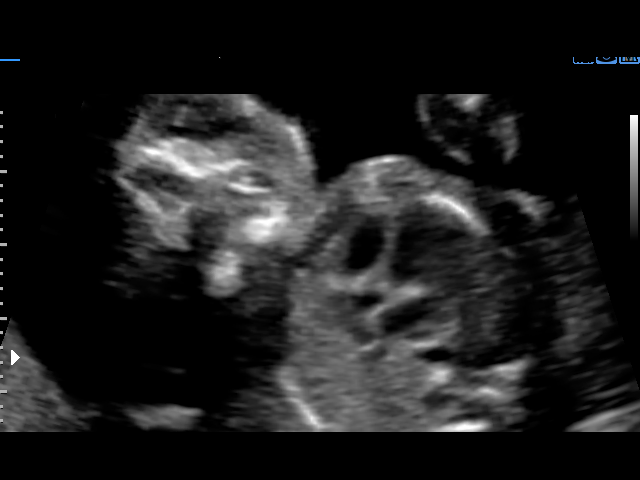
[im 50/103]
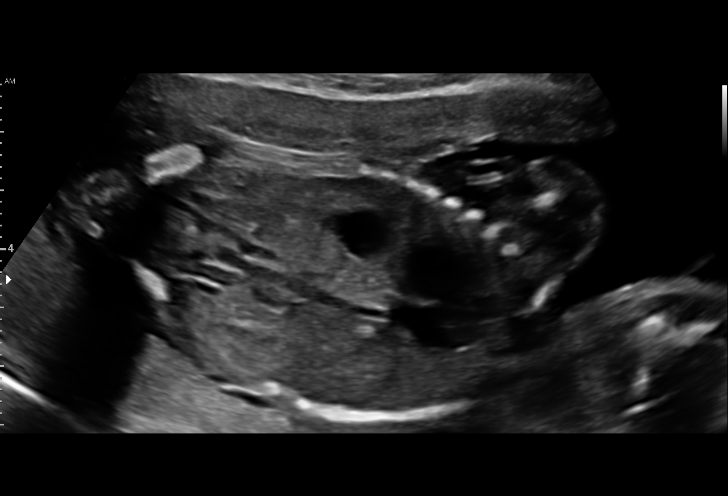
[im 57/103]
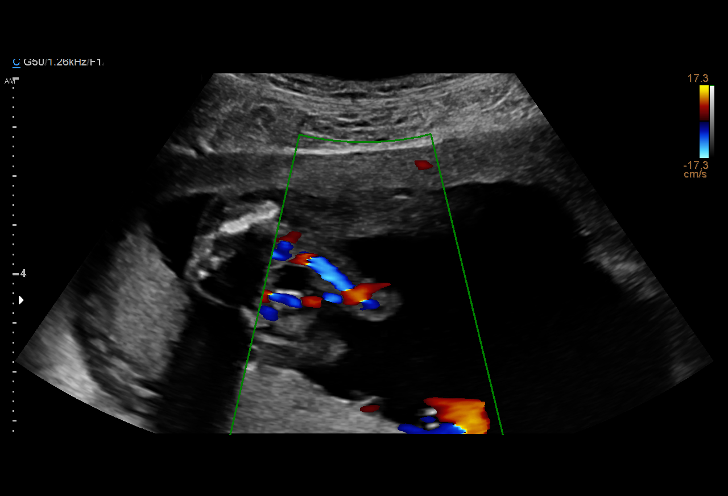
[im 65/103]
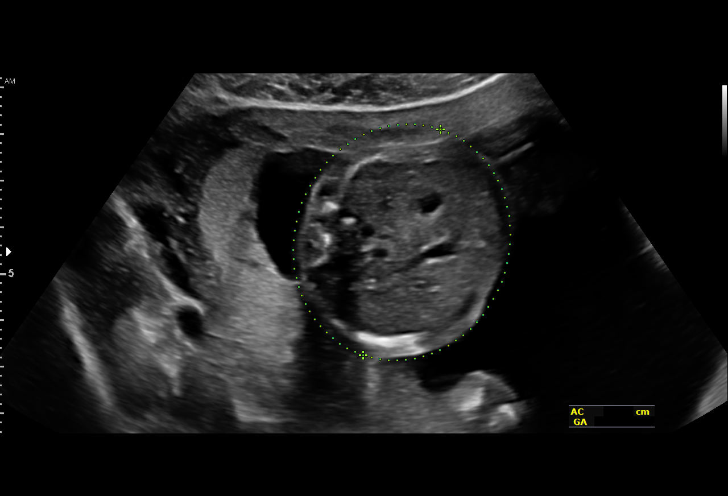
[im 72/103]
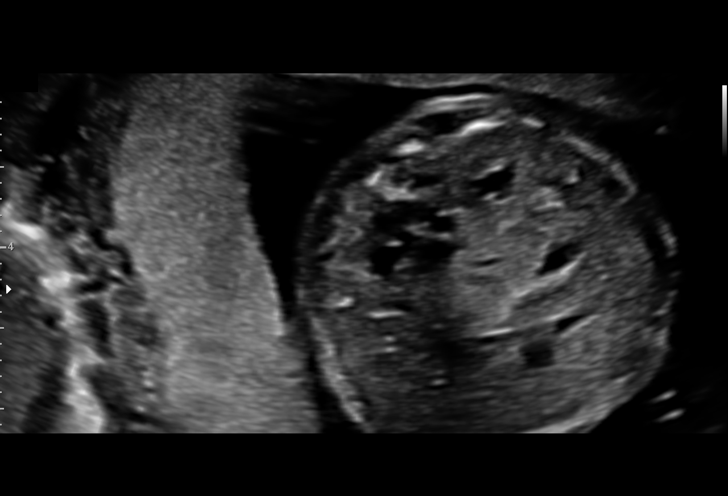
[im 80/103]
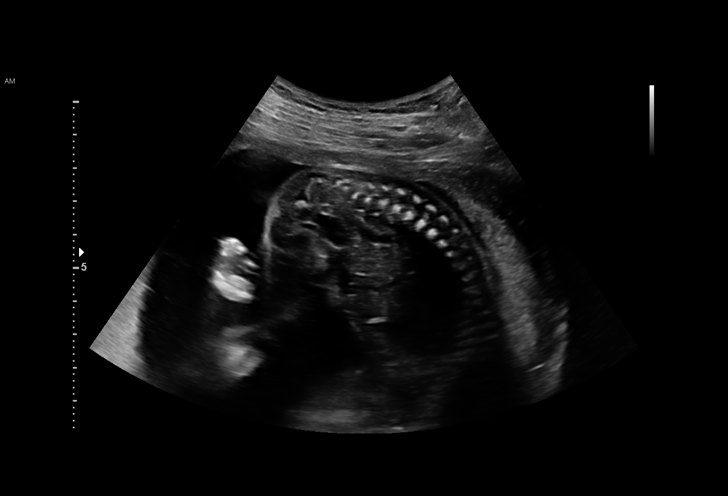
[im 87/103]
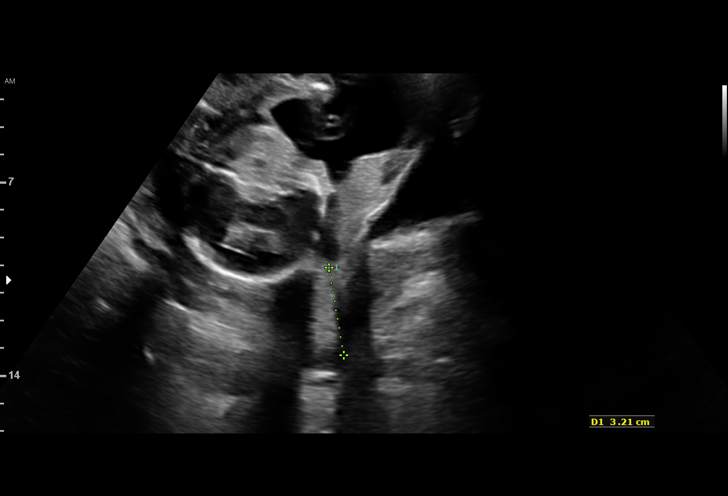
[im 95/103]
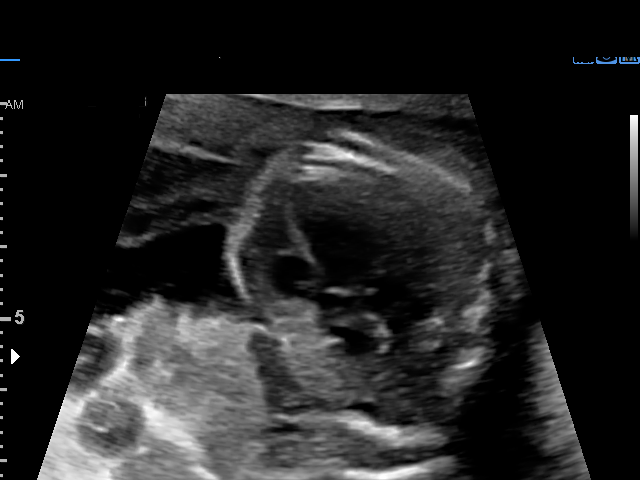
[im 103/103]
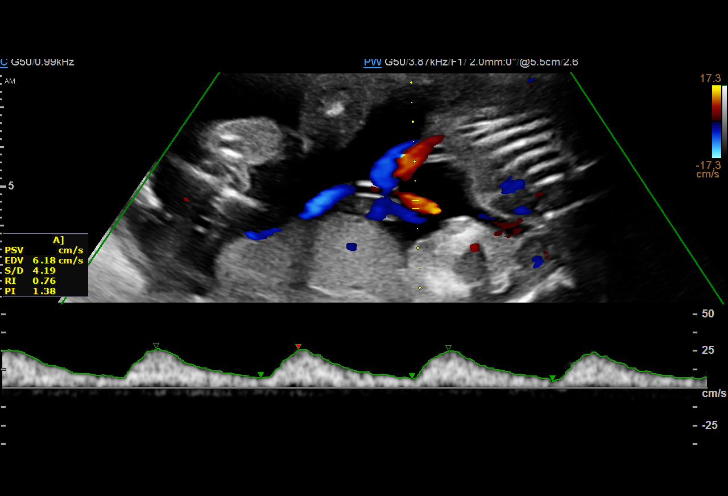

[14 of 28 positions shown; findings below may reference images not displayed]

pm)

Road [HOSPITAL]

Indications

19 weeks gestation of pregnancy
Substance abuse affecting pregnancy,
antepartum
History of cesarean delivery, currently
pregnant
Late prenatal care, second trimester
OB History

Gravidity:    6         Term:   2        Prem:   0        SAB:   1
TOP:          2       Ectopic:  0        Living: 2
Fetal Evaluation

Num Of Fetuses:     1
Fetal Heart         142
Rate(bpm):
Cardiac Activity:   Observed
Presentation:       Variable
Placenta:           Posterior, above cervical os
P. Cord Insertion:  Visualized, central

Amniotic Fluid
AFI FV:      Subjectively within normal limits

Largest Pocket(cm)
8.29
Biometry
BPD:      47.3  mm     G. Age:  20w 2d         74  %    CI:        82.57   %    70 - 86
FL/HC:      18.3   %    16.8 -
HC:      164.2  mm     G. Age:  19w 1d         19  %    HC/AC:      1.09        1.09 -
AC:      151.2  mm     G. Age:  20w 2d         66  %    FL/BPD:     63.4   %
FL:         30  mm     G. Age:  19w 2d         27  %    FL/AC:      19.8   %    20 - 24
CER:      22.9  mm     G. Age:  21w 3d         86  %
CM:          5  mm

Est. FW:     314  gm    0 lb 11 oz      50  %
Gestational Age

LMP:           21w 5d        Date:  01/31/16                 EDD:   11/06/16
U/S Today:     19w 5d                                        EDD:   11/20/16
Best:          19w 5d     Det. By:  Early Ultrasound         EDD:   11/20/16
(04/19/16)
Anatomy

Cranium:               Appears normal         Aortic Arch:            Appears normal
Cavum:                 Not well visualized    Ductal Arch:            Not well visualized
Ventricles:            Appears normal         Diaphragm:              Appears normal
Choroid Plexus:        Left choroid           Stomach:                Appears normal, left
plexus cyst, 3mm
sided
Cerebellum:            Appears normal         Abdomen:                Appears normal
Posterior Fossa:       Appears normal         Abdominal Wall:         Appears nml (cord
insert, abd wall)
Nuchal Fold:           Not applicable (>20    Cord Vessels:           Appears normal (3
wks GA)                                        vessel cord)
Face:                  Orbits nl; profile not Kidneys:                Appear normal
well visualized
Lips:                  Appears normal         Bladder:                Appears normal
Thoracic:              Appears normal         Spine:                  Ltd views no
intracranial signs of
NT
Heart:                 Appears normal         Upper Extremities:      Appears normal
(4CH, axis, and situs
RVOT:                  Appears normal         Lower Extremities:      Appears normal
LVOT:                  Appears normal

Other:  Fetus appears to be a female. Nasal bone visualized. Technically
difficult due to fetal position.
Doppler - Fetal Vessels

Umbilical Artery
S/D     %tile     RI              PI              PSV
(cm/s)
4       56

Cervix Uterus Adnexa

Cervix
Length:           3.21  cm.
Not visualized (advanced GA >18wks)

Uterus
No abnormality visualized.
Left Ovary
Not visualized.

Right Ovary
Not visualized.

Adnexa:       No abnormality visualized.
Comments

Choroid plexus cysts were noted.  This is a common variant
(1-2%) of all pregnancies, and carries a small association
with Trisomy 18.  However, there were no other ultrasound
stigmata of Trisomy 18, making the possibility of Trisomy 18
less likely.
Impression

Single IUP at 19w 5d
A small left choroid plexus cyst was noted (see commments)
Limited views of the fetal spine, ductal arch obtained
The remainder of the fetal anatomy appears normal
Ultrasound measurements are consistent with early
ultrasound
Posterior placenta without previa
Normal amniotic fluid volume
Recommendations

See separate consult note
Recommend ultrasound to reevaluate the fetal spine in 4
weeks; serial ultrasounds for growth every 4-6 weeks
thereafter.

## 2018-08-02 ENCOUNTER — Other Ambulatory Visit: Payer: Self-pay | Admitting: Family Medicine

## 2018-08-02 ENCOUNTER — Ambulatory Visit (INDEPENDENT_AMBULATORY_CARE_PROVIDER_SITE_OTHER): Payer: Medicaid Other | Admitting: Family Medicine

## 2018-08-02 ENCOUNTER — Other Ambulatory Visit: Payer: Self-pay

## 2018-08-02 ENCOUNTER — Encounter: Payer: Self-pay | Admitting: Family Medicine

## 2018-08-02 VITALS — BP 108/78 | HR 87 | Temp 98.2°F | Ht 67.0 in | Wt 181.0 lb

## 2018-08-02 DIAGNOSIS — N912 Amenorrhea, unspecified: Secondary | ICD-10-CM

## 2018-08-02 DIAGNOSIS — Z3201 Encounter for pregnancy test, result positive: Secondary | ICD-10-CM | POA: Diagnosis not present

## 2018-08-02 DIAGNOSIS — Z720 Tobacco use: Secondary | ICD-10-CM | POA: Insufficient documentation

## 2018-08-02 DIAGNOSIS — F17200 Nicotine dependence, unspecified, uncomplicated: Secondary | ICD-10-CM

## 2018-08-02 DIAGNOSIS — O0991 Supervision of high risk pregnancy, unspecified, first trimester: Secondary | ICD-10-CM

## 2018-08-02 LAB — POCT URINE PREGNANCY: Preg Test, Ur: POSITIVE — AB

## 2018-08-02 MED ORDER — NICOTINE 21 MG/24HR TD PT24: 21.0000 mg | MEDICATED_PATCH | Freq: Every day | TRANSDERMAL | 0 refills | Status: DC

## 2018-08-02 NOTE — Assessment & Plan Note (Signed)
New pregnancy.  Currently 193w6d by LMP.  High risk given history of 3 C-sections and 4 abortions. - Ambulatory referral to OB/GYN high risk OB - Obtaining obstetric panel - Scheduled for initial OB ultrasound - RTC 1 month if not scheduled in high risk OB or sooner if needed - Advised patient to begin daily prenatal vitamin and discussed continuing Lexapro and Wellbutrin and Subutex with recommendations to continue all 3 unless otherwise directed to discontinue by her behavioral therapy specialist

## 2018-08-02 NOTE — Assessment & Plan Note (Signed)
Chronic.  Current everyday user.  Interested in cessation.  Currently pregnant.  Benefits outweigh risk. - Discussed smoking cessation, initiating nicotine transdermal patch 21 mg daily

## 2018-08-02 NOTE — Progress Notes (Signed)
Subjective   Patient ID: Tiffany Salazar    DOB: 05/12/1981, 37 y.o. female   MRN: [id number]  CC: "I am pregnant"  HPI: Tiffany Salazar is a 37 y.o. female who presents to clinic today for the following:  Pregnancy first trimester: Patient is a [id number] here today after having positive pregnancy test at home.  This was a planned pregnancy.  Patient is excited hoping for boy and has a wedding next month.  LMP 06/29/2018.  Patient is not on prenatal vitamin.  Patient does have a history of one D&C with a new abnormality along with 3 elective abortions in her past.  She has a history of 3 prior C-sections.    Polysubstance use disorder: He has a history of IV drug use. She does have a recent history of heart valve replacement and is not currently on a anti-coagulation therapy.  Patient currently on Subutex since July with good response and adherence.  She is a current everyday smoker with 1 pack/day since age 37 and is interested in nicotine replacement therapy today.  ROS: see HPI for pertinent.  PMFSH: Reviewed. Smoking status reviewed. Medications reviewed.  Objective   BP 108/78 (BP Location: Right Arm, Patient Position: Sitting, Cuff Size: Normal)   Pulse 87   Temp 98.2 F (36.8 C) (Oral)   Ht 5\' 7"  (1.702 m)   Wt 181 lb (82.1 kg)   LMP 06/29/2018   SpO2 97%   BMI 28.35 kg/m  Vitals and nursing note reviewed.  General: well nourished, well developed, NAD with non-toxic appearance HEENT: normocephalic, atraumatic, moist mucous membranes Cardiovascular: regular rate and rhythm without murmurs, rubs, or gallops Lungs: clear to auscultation bilaterally with normal work of breathing Abdomen: soft, non-tender, non-distended, normoactive bowel sounds Skin: warm, dry, no rashes or lesions, cap refill < 2 seconds Extremities: warm and well perfused, normal tone, no edema  Assessment & Plan   Tobacco use disorder Chronic.  Current everyday user.  Interested in cessation.  Currently  pregnant.  Benefits outweigh risk. - Discussed smoking cessation, initiating nicotine transdermal patch 21 mg daily  High-risk pregnancy in first trimester New pregnancy.  Currently 6138w6d by LMP.  High risk given history of 3 C-sections and 4 abortions. - Ambulatory referral to OB/GYN high risk OB - Obtaining obstetric panel - Scheduled for initial OB ultrasound - RTC 1 month if not scheduled in high risk OB or sooner if needed - Advised patient to begin daily prenatal vitamin and discussed continuing Lexapro and Wellbutrin and Subutex with recommendations to continue all 3 unless otherwise directed to discontinue by her behavioral therapy specialist  Orders Placed This Encounter  Procedures  . US OB LESS THAN 14 WEEKS WITH OB TRANSVAGINAL    Standing Status:   Future    Standing Expiration Date:   10/04/2019    Order Specific Question:   Reason for Exam (SYMPTOM  OR DIAGNOSIS REQUIRED)    Answer:   Prenancy    Order Specific Question:   Preferred Imaging Location?    Answer[facility]  . Obstetric Panel, Including HIV  . Ambulatory referral to Obstetrics / Gynecology    Referral Priority:   Routine    Referral Type:   Consultation    Referral Reason:   Specialty Services Required    Requested Specialty:   Obstetrics and Gynecology    Number of Visits Requested:   1  . POCT urine pregnancy   Meds ordered this encounter  Medications  .  nicotine (NICODERM CQ - DOSED IN MG/24 HOURS) 21 mg/24hr patch    Sig: Place 1 patch (21 mg total) onto the skin daily.    Dispense:  28 patch    Refill:  0    Durward Parcel, DO [facility], PGY-3 08/02/2018, 11:03 AM

## 2018-08-02 NOTE — Patient Instructions (Signed)
Thank you for coming in to see us today. Please see below to review our plan for today's visit.  Congratulations regarding your pregnancy!  We will work on making sure you keep your baby healthy.  This will begin with you taking a daily prenatal vitamin.  I have placed a referral for you to see the high risk OB given your history.  They should call you to schedule the appointment within the next 1-2 months.  I will anticipate seeing you again in 1 month if you are unable to get into see them.  Please call your specialist regarding your medications and discuss whether or not they would like you to continue these.  The data behind these medications in pregnancy is week and is unlikely going to affect the fetus.  Continue your Subutex.  I have called in a prescription for nicotine patches which you can take daily.  Please call the clinic at [phone] if your symptoms worsen or you have any concerns. It was our pleasure to serve you.  Durward Parcelavid Damonte Frieson, DO [facility], PGY-3

## 2018-08-04 ENCOUNTER — Encounter: Payer: Self-pay | Admitting: Family Medicine

## 2018-08-04 ENCOUNTER — Other Ambulatory Visit: Payer: Medicaid Other

## 2018-08-04 DIAGNOSIS — Z3490 Encounter for supervision of normal pregnancy, unspecified, unspecified trimester: Secondary | ICD-10-CM

## 2018-08-05 LAB — OBSTETRIC PANEL, INCLUDING HIV
Antibody Screen: NEGATIVE
Basophils Absolute: 0.1 10*3/uL (ref 0.0–0.2)
Basos: 1 %
EOS (ABSOLUTE): 0.4 10*3/uL (ref 0.0–0.4)
Eos: 6 %
HIV Screen 4th Generation wRfx: NONREACTIVE
Hematocrit: 37.7 % (ref 34.0–46.6)
Hemoglobin: 12.4 g/dL (ref 11.1–15.9)
Hepatitis B Surface Ag: NEGATIVE
Immature Grans (Abs): 0 10*3/uL (ref 0.0–0.1)
Immature Granulocytes: 0 %
Lymphocytes Absolute: 3.2 10*3/uL — ABNORMAL HIGH (ref 0.7–3.1)
Lymphs: 45 %
MCH: 26.4 pg — ABNORMAL LOW (ref 26.6–33.0)
MCHC: 32.9 g/dL (ref 31.5–35.7)
MCV: 80 fL (ref 79–97)
Monocytes Absolute: 0.4 10*3/uL (ref 0.1–0.9)
Monocytes: 6 %
Neutrophils Absolute: 3 10*3/uL (ref 1.4–7.0)
Neutrophils: 42 %
Platelets: 215 10*3/uL (ref 150–450)
RBC: 4.69 x10E6/uL (ref 3.77–5.28)
RDW: 14.4 % (ref 12.3–15.4)
RPR Ser Ql: NONREACTIVE
Rh Factor: POSITIVE
Rubella Antibodies, IGG: 2.24 index (ref >0.99)
WBC: 7.1 10*3/uL (ref 3.4–10.8)

## 2018-08-06 ENCOUNTER — Encounter: Payer: Self-pay | Admitting: Family Medicine

## 2018-08-06 ENCOUNTER — Telehealth: Payer: Self-pay | Admitting: Family Medicine

## 2018-08-06 LAB — CULTURE, OB URINE

## 2018-08-06 LAB — URINE CULTURE, OB REFLEX

## 2018-08-09 ENCOUNTER — Ambulatory Visit (HOSPITAL_COMMUNITY): Payer: Medicaid Other

## 2018-08-16 ENCOUNTER — Ambulatory Visit (HOSPITAL_COMMUNITY)
Admission: RE | Admit: 2018-08-16 | Discharge: 2018-08-16 | Disposition: A | Discharge status: Home / Self Care | Payer: Medicaid Other | Source: Ambulatory Visit | Attending: Family Medicine | Admitting: Family Medicine

## 2018-08-16 DIAGNOSIS — O0991 Supervision of high risk pregnancy, unspecified, first trimester: Secondary | ICD-10-CM

## 2018-08-16 DIAGNOSIS — Z3A01 Less than 8 weeks gestation of pregnancy: Secondary | ICD-10-CM | POA: Insufficient documentation

## 2018-09-20 ENCOUNTER — Ambulatory Visit (INDEPENDENT_AMBULATORY_CARE_PROVIDER_SITE_OTHER): Payer: Medicaid Other | Admitting: Family Medicine

## 2018-09-20 ENCOUNTER — Encounter: Payer: Self-pay | Admitting: Family Medicine

## 2018-09-20 VITALS — BP 90/52 | HR 76 | Wt 190.3 lb

## 2018-09-20 DIAGNOSIS — O0991 Supervision of high risk pregnancy, unspecified, first trimester: Secondary | ICD-10-CM | POA: Diagnosis not present

## 2018-09-20 DIAGNOSIS — F1990 Other psychoactive substance use, unspecified, uncomplicated: Secondary | ICD-10-CM

## 2018-09-20 DIAGNOSIS — O09521 Supervision of elderly multigravida, first trimester: Secondary | ICD-10-CM | POA: Diagnosis not present

## 2018-09-20 DIAGNOSIS — O34219 Maternal care for unspecified type scar from previous cesarean delivery: Secondary | ICD-10-CM | POA: Diagnosis not present

## 2018-09-20 DIAGNOSIS — O09529 Supervision of elderly multigravida, unspecified trimester: Secondary | ICD-10-CM | POA: Insufficient documentation

## 2018-09-20 DIAGNOSIS — Z952 Presence of prosthetic heart valve: Secondary | ICD-10-CM

## 2018-09-20 DIAGNOSIS — R768 Other specified abnormal immunological findings in serum: Secondary | ICD-10-CM

## 2018-09-20 DIAGNOSIS — Z953 Presence of xenogenic heart valve: Secondary | ICD-10-CM | POA: Insufficient documentation

## 2018-09-20 DIAGNOSIS — O0990 Supervision of high risk pregnancy, unspecified, unspecified trimester: Secondary | ICD-10-CM | POA: Insufficient documentation

## 2018-09-20 HISTORY — DX: Supervision of elderly multigravida, unspecified trimester: O09.529

## 2018-09-20 LAB — POCT URINALYSIS DIP (DEVICE)
Bilirubin Urine: NEGATIVE
Glucose, UA: NEGATIVE mg/dL
Ketones, ur: NEGATIVE mg/dL
Leukocytes, UA: NEGATIVE
Nitrite: NEGATIVE
Protein, ur: NEGATIVE mg/dL
Specific Gravity, Urine: >=1.03 (ref 1.005–1.030)
Urobilinogen, UA: 1 mg/dL (ref 0.0–1.0)
pH: 6 (ref 5.0–8.0)

## 2018-09-20 NOTE — Progress Notes (Signed)
Subjective:  Tiffany Salazar is a [id number] 58w6dbeing seen today for her first obstetrical visit.  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 Patient does intend to breast feed. Pregnancy history fully reviewed.  Patient reports no complaints.  BP (!) 90/52   Pulse 76   Wt 190 lb 4.8 oz (86.3 kg)   LMP 06/29/2018   BMI 29.81 kg/m   HISTORY: OB History  Gravida Para Term Preterm AB Living  _0 SAB TAB Ectopic Multiple Live Births  _1 # Outcome Date GA Lbr Len/2nd Weight Sex Delivery Anes PTL Lv  7 Current           6 Term 11/13/16 463w0d8 lb 2 oz (3.685 kg)  CS-Unspec EPI  LIV  5 TAB 2017          4 Term 05/06/11 3913w0d lb 5 oz (3.317 kg) F CS-Unspec EPI  LIV     Birth Comments: repeat c/s  3 SAB 2005          2 Term 09/28/01 37w31w0dlb 8 oz (2.495 kg) F CS-LTranv EPI  LIV     Birth Comments: c/s ftp  1 TAB 2000            Past Medical History:  Diagnosis Date  . Complication of anesthesia    problems getting numb  . Hepatitis C   . MRSA infection 03/21/2015  . Subdural hematoma (HCC)[place]/2015  . Substance use disorder   . Tricuspid valve replaced     Past Surgical History:  Procedure Laterality Date  . CESAREAN SECTION  x 3  . TEE WITHOUT CARDIOVERSION N/A 03/23/2018   Procedure: TRANSESOPHAGEAL ECHOCARDIOGRAM (TEE);  Surgeon: HiltPixie Casino;  [facility] [place];  Laterality: N/A;  . TEE WITHOUT CARDIOVERSION N/A 03/29/2018   Procedure: TRANSESOPHAGEAL ECHOCARDIOGRAM (TEE);  Surgeon: BartGaye Pollack;  [facility] [place];  Laterality: N/A;  . TRICUSPID VALVE REPLACEMENT N/A 03/29/2018   Procedure: TRICUSPID VALVE REPLACEMENT Using 27 mm Magna Ease Mitral Valve Pericardial Bioprosthesis;  Surgeon: BartGaye Pollack;   [facility] [place];  Laterality: N/A;    Family History  Problem Relation Age of Onset  . Thyroid disease Mother   . Breast cancer Mother 54  31   breast  . Alcohol abuse Father      Exam  BP (!) 90/52   Pulse 76   Wt 190 lb 4.8 oz (86.3 kg)   LMP 06/29/2018   BMI 29.81 kg/m   CONSTITUTIONAL: Well-developed, well-nourished female in no acute distress.  HENT:  Normocephalic, atraumatic, External right and left ear normal. Oropharynx is clear and moist EYES: Conjunctivae and EOM are normal. Pupils are equal, round, and reactive to light. No scleral icterus.  NECK: Normal range of motion, supple, no masses.  Normal thyroid.  CARDIOVASCULAR: Normal heart rate noted, regular rhythm RESPIRATORY: Clear to auscultation bilaterally. Effort and breath sounds normal, no problems with respiration noted. BREASTS: Symmetric in size. No masses, skin changes, nipple drainage, or lymphadenopathy. ABDOMEN: Soft, normal bowel sounds, no distention noted.  No tenderness, rebound or guarding.  PELVIC: Normal appearing external genitalia; normal appearing vaginal mucosa and  cervix. No abnormal discharge noted. Normal uterine size, no other palpable masses, no uterine or adnexal tenderness. MUSCULOSKELETAL: Normal range of motion. No tenderness.  No cyanosis, clubbing, or edema.  2+ distal pulses. SKIN: Skin is warm and dry. No rash noted. Not diaphoretic. No erythema. No pallor. NEUROLOGIC: Alert and oriented to person, place, and time. Normal reflexes, muscle tone coordination. No cranial nerve deficit noted. PSYCHIATRIC: Normal mood and affect. Normal behavior. Normal judgment and thought content.    Assessment:    Pregnancy: [id number] Patient Active Problem List   Diagnosis Date Noted  . Supervision of high risk pregnancy, antepartum 09/20/2018  . History of tricuspid valve replacement with mechanical valve 09/20/2018  . High-risk pregnancy in first trimester  08/02/2018  . Tobacco use disorder 08/02/2018  . Hepatitis C antibody positive in blood   . 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 Previous cesarean section complicating pregnancy, antepartum condition or complication 25/74/9355  . Substance use disorder 12/30/2013      Plan:   1. Supervision of high risk pregnancy, antepartum Panorama - [place] MFM OB DETAIL +14 WK; Future - [facility] AMB BABYSCRIPTS OPT IN - Comp Met (CMET) - Protein / creatinine ratio, urine  2. Hepatitis C antibody positive in blood - Hepatitis C antibody  3. History of tricuspid valve replacement with mechanical valve I sent a message to Dr Cyndia Bent (CT surg) regarding need for anticoagulation. Currently not anticoagulated. May not need due to type of mechanical heart valve. Referred patient back to CT surg - may need echo because newly pregnant, although had one in 04/2018 which showed normal heart function and valve movement.  4. Substance use disorder On Subutex. Currently prescribed by [facility]. Discussed withdrawal in baby. Will need NAS.   5. AMA (advanced maternal age) multigravida 35+, unspecified trimester Panorama Start ASA 10m - Comp Met (CMET) - Protein / creatinine ratio, urine  6. Previous cesarean section complicating pregnancy, antepartum condition or complication Will need repeat cesarean section     Problem list reviewed and updated. 75% of 45 min visit spent on counseling and coordination of care.     JTruett Mainland1/27/2020

## 2018-09-20 NOTE — Progress Notes (Signed)
Here for new ob. Given new ob packet. Medicaid home form completed. C/o hands feel numb sometimes. Signed up for babyscripts app.

## 2018-09-20 NOTE — Progress Notes (Signed)
Panorama ordered and sent.

## 2018-09-20 NOTE — Addendum Note (Signed)
Addended by: Gerome Apley on: 09/20/2018 11:24 AM   Modules accepted: Orders

## 2018-09-20 NOTE — Progress Notes (Signed)
Anatomy ultrasound scheduled for March 17 @ 1030am

## 2018-09-21 ENCOUNTER — Encounter (HOSPITAL_COMMUNITY): Payer: Self-pay

## 2018-09-21 ENCOUNTER — Inpatient Hospital Stay (HOSPITAL_COMMUNITY): Payer: Medicaid Other

## 2018-09-21 ENCOUNTER — Inpatient Hospital Stay (HOSPITAL_COMMUNITY)
Admission: AD | Admit: 2018-09-21 | Discharge: 2018-09-21 | Disposition: A | Discharge status: Home / Self Care | Payer: Medicaid Other | Attending: Obstetrics and Gynecology | Admitting: Obstetrics and Gynecology

## 2018-09-21 ENCOUNTER — Other Ambulatory Visit: Payer: Self-pay | Admitting: *Deleted

## 2018-09-21 ENCOUNTER — Other Ambulatory Visit: Payer: Medicaid Other

## 2018-09-21 ENCOUNTER — Other Ambulatory Visit: Payer: Self-pay

## 2018-09-21 DIAGNOSIS — R768 Other specified abnormal immunological findings in serum: Secondary | ICD-10-CM

## 2018-09-21 DIAGNOSIS — F17210 Nicotine dependence, cigarettes, uncomplicated: Secondary | ICD-10-CM | POA: Diagnosis not present

## 2018-09-21 DIAGNOSIS — K5900 Constipation, unspecified: Secondary | ICD-10-CM | POA: Diagnosis not present

## 2018-09-21 DIAGNOSIS — O209 Hemorrhage in early pregnancy, unspecified: Secondary | ICD-10-CM | POA: Diagnosis not present

## 2018-09-21 DIAGNOSIS — Z3A12 12 weeks gestation of pregnancy: Secondary | ICD-10-CM | POA: Diagnosis not present

## 2018-09-21 DIAGNOSIS — O99331 Smoking (tobacco) complicating pregnancy, first trimester: Secondary | ICD-10-CM | POA: Diagnosis not present

## 2018-09-21 LAB — COMPREHENSIVE METABOLIC PANEL
ALT: 9 IU/L (ref 0–32)
AST: 15 IU/L (ref 0–40)
Albumin/Globulin Ratio: 1.7 (ref 1.2–2.2)
Albumin: 3.8 g/dL (ref 3.8–4.8)
Alkaline Phosphatase: 61 IU/L (ref 39–117)
BUN/Creatinine Ratio: 20 (ref 9–23)
BUN: 14 mg/dL (ref 6–20)
Bilirubin Total: 0.3 mg/dL (ref 0.0–1.2)
CO2: 20 mmol/L (ref 20–29)
Calcium: 8.4 mg/dL — ABNORMAL LOW (ref 8.7–10.2)
Chloride: 105 mmol/L (ref 96–106)
Creatinine, Ser: 0.71 mg/dL (ref 0.57–1.00)
GFR calc Af Amer: 128 mL/min/{1.73_m2} (ref >59)
GFR calc non Af Amer: 111 mL/min/{1.73_m2} (ref >59)
Globulin, Total: 2.3 g/dL (ref 1.5–4.5)
Glucose: 75 mg/dL (ref 65–99)
Potassium: 4.1 mmol/L (ref 3.5–5.2)
Sodium: 139 mmol/L (ref 134–144)
Total Protein: 6.1 g/dL (ref 6.0–8.5)

## 2018-09-21 LAB — WET PREP, GENITAL
Clue Cells Wet Prep HPF POC: NONE SEEN
Sperm: NONE SEEN
Trich, Wet Prep: NONE SEEN
Yeast Wet Prep HPF POC: NONE SEEN

## 2018-09-21 LAB — URINALYSIS, ROUTINE W REFLEX MICROSCOPIC
Bilirubin Urine: NEGATIVE
Glucose, UA: NEGATIVE mg/dL
Ketones, ur: NEGATIVE mg/dL
Leukocytes, UA: NEGATIVE
Nitrite: NEGATIVE
Protein, ur: NEGATIVE mg/dL
Specific Gravity, Urine: 1.008 (ref 1.005–1.030)
pH: 6 (ref 5.0–8.0)

## 2018-09-21 LAB — HEPATITIS C ANTIBODY: Hep C Virus Ab: >11 s/co ratio — ABNORMAL HIGH (ref 0.0–0.9)

## 2018-09-21 LAB — PROTEIN / CREATININE RATIO, URINE
Creatinine, Urine: 176.1 mg/dL
Protein, Ur: 16.4 mg/dL
Protein/Creat Ratio: 93 mg/g creat (ref 0–200)

## 2018-09-21 MED ORDER — MAGNESIUM 200 MG PO TABS: 2.0000 | ORAL_TABLET | Freq: Every day | ORAL | 3 refills | Status: DC

## 2018-09-21 NOTE — Discharge Instructions (Signed)
Vaginal Bleeding During Pregnancy, First Trimester ° °A small amount of bleeding from the vagina (spotting) is relatively common during early pregnancy. It usually stops on its own. Various things may cause bleeding or spotting during early pregnancy. Some bleeding may be related to the pregnancy, and some may not. In many cases, the bleeding is normal and is not a problem. However, bleeding can also be a sign of something serious. Be sure to tell your health care provider about any vaginal bleeding right away. °Some possible causes of vaginal bleeding during the first trimester include: °· Infection or inflammation of the cervix. °· Growths (polyps) on the cervix. °· Miscarriage or threatened miscarriage. °· Pregnancy tissue developing outside of the uterus (ectopic pregnancy). °· A mass of tissue developing in the uterus due to an egg being fertilized incorrectly (molar pregnancy). °Follow these instructions at home: °Activity °· Follow instructions from your health care provider about limiting your activity. Ask what activities are safe for you. °· If needed, make plans for someone to help with your regular activities. °· Do not have sex or orgasms until your health care provider says that this is safe. °General instructions °· Take over-the-counter and prescription medicines only as told by your health care provider. °· Pay attention to any changes in your symptoms. °· Do not use tampons or douche. °· Write down how many pads you use each day, how often you change pads, and how soaked (saturated) they are. °· If you pass any tissue from your vagina, save the tissue so you can show it to your health care provider. °· Keep all follow-up visits as told by your health care provider. This is important. °Contact a health care provider if: °· You have vaginal bleeding during any part of your pregnancy. °· You have cramps or labor pains. °· You have a fever. °Get help right away if: °· You have severe cramps in your  back or abdomen. °· You pass large clots or a large amount of tissue from your vagina. °· Your bleeding increases. °· You feel light-headed or weak, or you faint. °· You have chills. °· You are leaking fluid or have a gush of fluid from your vagina. °Summary °· A small amount of bleeding (spotting) from the vagina is relatively common during early pregnancy. °· Various things may cause bleeding or spotting in early pregnancy. °· Be sure to tell your health care provider about any vaginal bleeding right away. °This information is not intended to replace advice given to you by your health care provider. Make sure you discuss any questions you have with your health care provider. °Document Released: 05/21/2005 Document Revised: 11/13/2016 Document Reviewed: 11/13/2016 °Elsevier Interactive Patient Education © 2019 [employer] ° °

## 2018-09-21 NOTE — MAU Note (Signed)
Pt had OB appointment yesterday, has issues with constipation, took laxative last night.  Started cramping this a.m, had BM - then cramping stopped, but noted blood with wiping, also some in underwear.

## 2018-09-21 NOTE — Progress Notes (Signed)
Received message from Dr. Adrian Blackwater requesting that reflex HCV be added to viral load. Discussed that [facility] reports that The CDC recommends that a positive HCV antibody result be followed up with a HCV Nucleic Acid Amplification test (382505) and he requested that be added to the previously requested test.

## 2018-09-21 NOTE — MAU Provider Note (Addendum)
History     CSN: [id number]  Arrival date and time: 09/21/18 1104   First Provider Initiated Contact with Patient 09/21/18 1245      Chief Complaint  Patient presents with  . Vaginal Bleeding   Tiffany Salazar is a 38 y.o. [phone] at [redacted]w[redacted]d presenting with light vaginal bleeding that began this morning (09/21/2018). She reports dark red blood on the toilet paper after a BM, and noticed the same burgundy color on her underwear. She had cramps this morning but says they were not out of the ordinary before a BM. She denies pain at this time. She denies recent sexual intercourse, endorses a pelvic exam at her new OB appt yesterday (09/20/2018).    OB History    Gravida  7   Para  3   Term  3   Preterm      AB  3   Living  3     SAB  1   TAB  2   Ectopic      Multiple      Live Births  3           Past Medical History:  Diagnosis Date  . Complication of anesthesia    problems getting numb  . Hepatitis C   . MRSA infection 03/21/2015  . Subdural hematoma (HCC) 12/26/2013  . Substance use disorder   . Tricuspid valve replaced     Past Surgical History:  Procedure Laterality Date  . CESAREAN SECTION  x 3  . TEE WITHOUT CARDIOVERSION N/A 03/23/2018   Procedure: TRANSESOPHAGEAL ECHOCARDIOGRAM (TEE);  Surgeon: Chrystie Nose, MD;  Location: [facility] ENDOSCOPY;  Service: Cardiovascular;  Laterality: N/A;  . TEE WITHOUT CARDIOVERSION N/A 03/29/2018   Procedure: TRANSESOPHAGEAL ECHOCARDIOGRAM (TEE);  Surgeon: Alleen Borne, MD;  Location: [facility] OR;  Service: Open Heart Surgery;  Laterality: N/A;  . TRICUSPID VALVE REPLACEMENT N/A 03/29/2018   Procedure: TRICUSPID VALVE REPLACEMENT Using 27 mm Magna Ease Mitral Valve Pericardial Bioprosthesis;  Surgeon: Alleen Borne, MD;  Location: [facility] OR;  Service: Open Heart Surgery;  Laterality: N/A;    Family History  Problem Relation Age of Onset  . Thyroid disease Mother   . Breast cancer Mother 75       breast  . Alcohol abuse Father      Social History   Tobacco Use  . Smoking status: Current Every Day Smoker    Packs/day: 1.00    Types: Cigarettes  . Smokeless tobacco: Current User  Substance Use Topics  . Alcohol use: Not Currently    Alcohol/week: 0.0 standard drinks    Comment: Pt stated she has started goint ot rehab clinic  . Drug use: Not Currently    Types: IV, Cocaine, Heroin, Marijuana    Comment: Cocaine last use was 04/17/2016. Heroin last use was 04/18/2016. Methadone last use was 04/12/2016. Marijuana 04/18/2016.    Allergies:  Allergies  Allergen Reactions  . Gentamicin Rash  . Vancomycin Rash    Medications Prior to Admission  Medication Sig Dispense Refill Last Dose  . buprenorphine (SUBUTEX) 8 MG SUBL SL tablet Place under the tongue daily.   Taking  . FEROSUL 325 (65 Fe) MG tablet Take 325 mg by mouth daily.   Taking  . nicotine (NICODERM CQ - DOSED IN MG/24 HOURS) 21 mg/24hr patch Place 1 patch (21 mg total) onto the skin daily. 28 patch 0 Taking  . Prenatal Vit-Fe Fumarate-FA (PRENATAL MULTIVITAMIN) TABS tablet Take 1 tablet by  mouth daily at 12 noon.   Taking    Review of Systems  Constitutional: Negative.  Negative for fatigue and fever.  HENT: Negative.   Eyes: Negative.   Respiratory: Negative.  Negative for cough, chest tightness and shortness of breath.   Cardiovascular: Negative.  Negative for chest pain.  Gastrointestinal: Positive for constipation (took Miralax 09/20/2018, with last BM today 09/21/2018). Negative for abdominal distention, abdominal pain, diarrhea, nausea and vomiting.  Endocrine: Negative.   Genitourinary: Positive for vaginal bleeding. Negative for difficulty urinating, dysuria, pelvic pain, vaginal discharge and vaginal pain.  Musculoskeletal: Negative.   Skin: Negative.   Allergic/Immunologic: Negative.   Neurological: Negative.  Negative for dizziness, light-headedness and headaches.  Hematological: Negative.   Psychiatric/Behavioral: Negative.     Physical Exam   Blood pressure 92/63, pulse 93, temperature 97.9 F (36.6 C), temperature source Oral, resp. rate 18, height 5\' 7"  (1.702 m), weight 30.4 kg, last menstrual period 06/29/2018, SpO2 99 %, unknown if currently breastfeeding.  Physical Exam  Constitutional: She is oriented to person, place, and time. She appears well-developed and well-nourished. No distress.  HENT:  Head: Normocephalic.  Eyes: Pupils are equal, round, and reactive to light.  Neck: Normal range of motion.  Cardiovascular: Normal rate.  Respiratory: Effort normal.  GI: Soft. She exhibits no distension. There is no abdominal tenderness.  Genitourinary: Cervix exhibits friability.    Vaginal bleeding (small amt of dark red blood in vaginal vault) present.  There is bleeding (small amt of dark red blood in vaginal vault) in the vagina.  Musculoskeletal: Normal range of motion.  Neurological: She is alert and oriented to person, place, and time.  Skin: Skin is warm and dry. No rash noted. She is not diaphoretic.  Psychiatric: She has a normal mood and affect. Her behavior is normal. Judgment and thought content normal.   [place] Ob Comp Less 14 Wks  Result Date: 09/21/2018 CLINICAL DATA:  Pelvic cramping and vaginal bleeding in 1st trimester pregnancy. Current assigned gestational age of [redacted] weeks 0 days. EXAM: OBSTETRIC <14 WK ULTRASOUND TECHNIQUE: Transabdominal ultrasound was performed for evaluation of the gestation as well as the maternal uterus and adnexal regions. COMPARISON:  08/16/2018 FINDINGS: Intrauterine gestational sac: Single Yolk sac:  Not Visualized. Embryo:  Visualized. Cardiac Activity: Visualized. Heart Rate: 146 bpm CRL:   57 mm   12 w 2 d                  US EDC: 04/03/2019 Subchorionic hemorrhage:  None visualized. Maternal uterus/adnexae: The right ovary is normal in appearance. The left ovary was not visualized, however no adnexal mass or abnormal free fluid identified. IMPRESSION: Single living  IUP with assigned gestational age of [redacted] weeks 0 days. Appropriate fetal growth. No significant maternal uterine or adnexal abnormality identified. Electronically Signed   By: Myles RosenthalJohn  Stahl M.D.   On: 09/21/2018 14:12    MAU Course  Procedures  MDM GC/CT, wet prep TVUS  Assessment and Plan  Vaginal bleeding in first trimester of pregnancy - discharge to home with bleeding precautions  Bernerd LimboJamilla R Walker, SNM 09/21/2018, 1:22 PM   SNM attestation:  I have seen and examined this patient and agree with above documentation in the SNM's note.   Tiffany Salazar is a 38 y.o. [phone] at 4770w0d reporting constipation for which she takes laxatives on a weekly basis; last taken last night (09/20/2018). She reports she started having abdominal pain/cramping this AM, had a BM then the cramping  stopped. When she went to the BR she noticed VB in her underwear and with wiping.   PE: Patient Vitals for the past 24 hrs:  BP Temp Temp src Pulse Resp SpO2 Height Weight  09/21/18 1436 (!) 101/59 - - 71 - - - -  09/21/18 1129 92/63 97.9 F (36.6 C) Oral 93 18 99 % 5\' 7"  (1.702 m) 67 lb (30.4 kg)   Gen: calm comfortable, NAD Resp: normal effort, no distress Heart: Regular rate Abd: Soft, NT, gravid, S=D  FHTs by doppler: 166 bpm   ROS, labs, PMH reviewed  Orders Placed This Encounter  Procedures  . Wet prep, genital  . US OB Comp Less 14 Wks  . Urinalysis, Routine w reflex microscopic  . Discharge patient   Meds ordered this encounter  Medications  . Magnesium 200 MG TABS    Sig: Take 2 tablets (400 mg total) by mouth at bedtime.    Dispense:  60 each    Refill:  3    MDM CCUA Wet Prep GC/CT - pending  OB < 14 wks U/S  Results for orders placed or performed during the hospital encounter of 09/21/18 (from the past 24 hour(s))  Urinalysis, Routine w reflex microscopic     Status: Abnormal   Collection Time: 09/21/18 11:48 AM  Result Value Ref Range   Color, Urine YELLOW YELLOW    APPearance CLEAR CLEAR   Specific Gravity, Urine 1.008 1.005 - 1.030   pH 6.0 5.0 - 8.0   Glucose, UA NEGATIVE NEGATIVE mg/dL   Hgb urine dipstick LARGE (A) NEGATIVE   Bilirubin Urine NEGATIVE NEGATIVE   Ketones, ur NEGATIVE NEGATIVE mg/dL   Protein, ur NEGATIVE NEGATIVE mg/dL   Nitrite NEGATIVE NEGATIVE   Leukocytes, UA NEGATIVE NEGATIVE   RBC / HPF 0-5 0 - 5 RBC/hpf   WBC, UA 0-5 0 - 5 WBC/hpf   Bacteria, UA RARE (A) NONE SEEN   Squamous Epithelial / LPF 0-5 0 - 5   Mucus PRESENT   Wet prep, genital     Status: Abnormal   Collection Time: 09/21/18  1:21 PM  Result Value Ref Range   Yeast Wet Prep HPF POC NONE SEEN NONE SEEN   Trich, Wet Prep NONE SEEN NONE SEEN   Clue Cells Wet Prep HPF POC NONE SEEN NONE SEEN   WBC, Wet Prep HPF POC MODERATE (A) NONE SEEN   Sperm NONE SEEN     [place] Ob Comp Less 14 Wks  Result Date: 09/21/2018 CLINICAL DATA:  Pelvic cramping and vaginal bleeding in 1st trimester pregnancy. Current assigned gestational age of [redacted] weeks 0 days. EXAM: OBSTETRIC <14 WK ULTRASOUND TECHNIQUE: Transabdominal ultrasound was performed for evaluation of the gestation as well as the maternal uterus and adnexal regions. COMPARISON:  08/16/2018 FINDINGS: Intrauterine gestational sac: Single Yolk sac:  Not Visualized. Embryo:  Visualized. Cardiac Activity: Visualized. Heart Rate: 146 bpm CRL:   57 mm   12 w 2 d                  US EDC: 04/03/2019 Subchorionic hemorrhage:  None visualized. Maternal uterus/adnexae: The right ovary is normal in appearance. The left ovary was not visualized, however no adnexal mass or abnormal free fluid identified. IMPRESSION: Single living IUP with assigned gestational age of [redacted] weeks 0 days. Appropriate fetal growth. No significant maternal uterine or adnexal abnormality identified. Electronically Signed   By: Myles RosenthalJohn  Stahl M.D.   On: 09/21/2018 14:12  Assessment: 1. Vaginal bleeding in pregnancy, first trimester     Plan: - Discharge home  -  Bleeding precautions given - Follow-up as scheduled at your doctor's office for next prenatal visit or sooner as needed if symptoms worsen. - Return to maternity admissions if symptoms worsen  I personally was present during the history, physical exam, and medical decision-making activities of this service and have verified that the service and findings are accurately documented in the student's note.  Raelyn Mora, CNM 09/21/2018 3:27 PM

## 2018-09-22 LAB — GC/CHLAMYDIA PROBE AMP (~~LOC~~) NOT AT ARMC
Chlamydia: NEGATIVE
Neisseria Gonorrhea: NEGATIVE

## 2018-09-23 ENCOUNTER — Encounter: Payer: Self-pay | Admitting: Family Medicine

## 2018-09-23 LAB — HCV RT-PCR, QUANT (NON-GRAPH)
HCV log10: 2.663 log10 IU/mL
Hepatitis C Quantitation: 460 IU/mL

## 2018-09-23 LAB — HEPATITIS C VRS RNA DETECT BY PCR-QUAL: HCV RNA NAA Qualitative: POSITIVE — AB

## 2018-09-23 LAB — HCV AB W REFLEX TO QUANT PCR: HCV Ab: >11 s/co ratio — ABNORMAL HIGH (ref 0.0–0.9)

## 2018-09-27 ENCOUNTER — Encounter: Payer: Self-pay | Admitting: *Deleted

## 2018-09-29 ENCOUNTER — Telehealth: Payer: Self-pay

## 2018-09-29 NOTE — Telephone Encounter (Signed)
Ms. Leclerc contacted the office stating she was pregnant and needed a referral from Dr. Laneta Simmers regarding her cardiac history?  Patient contacted and left a message stating she needed to contact her Cardiologist for clearance and possible dental evaluation.  Will await return call if warranted.

## 2018-09-30 ENCOUNTER — Telehealth: Payer: Self-pay | Admitting: Obstetrics and Gynecology

## 2018-09-30 ENCOUNTER — Encounter: Payer: Self-pay | Admitting: Obstetrics and Gynecology

## 2018-09-30 ENCOUNTER — Telehealth: Payer: Self-pay

## 2018-09-30 NOTE — Telephone Encounter (Signed)
Called patient to see if she had followed up with cardiology, patient said she hasn't because she didn't know who she needed to call there to make an appointment gave her the number to heart care, pt verifified the number I gave her and stated she would call.  Patient asked about giving her a dental referral I advised her on the protocol of the referral and gave her our fax number.  Patient asked if her panorama results were available , advised patient they were not and told her we should have them by her next visit. She verbalized understanding and had no questions.

## 2018-09-30 NOTE — Telephone Encounter (Signed)
-----   Message from Conan Bowens, MD sent at 09/30/2018  1:02 PM EST ----- Please contact patient and ask if she has been able to get cardiology appointment, if not, she needs to follow up with cardiology (saw Dr. Duke Salvia in 04/2018).

## 2018-09-30 NOTE — Telephone Encounter (Signed)
Called in regards to a dental letter. Stated she spoke with someone today about the same matter. Would like [place] to fax an letter to her dentist. Stated she did not know the name of the office however the dental provider's name is Stephenie Acres and also gave a fax number of [phone].

## 2018-10-01 LAB — INHERITEST(R) CF/SMA PANEL

## 2018-10-04 ENCOUNTER — Telehealth: Payer: Self-pay

## 2018-10-04 NOTE — Telephone Encounter (Signed)
   [facility] Pre-operative Risk Assessment    Request for surgical clearance:  1. What type of surgery is being performed? LIMITED EXAM & XRAY   2. When is this surgery scheduled? 10-11-2018   3. What type of clearance is required (medical clearance vs. Pharmacy clearance to hold med vs. Both)? BOTH-DENTAL PROPHYLACTIC  4. Are there any medications that need to be held prior to surgery and how long?NONE LISTED   5. Practice name and name of physician performing surgery? [facility]   6. What is your office phone number [phone]   7.   What is your office fax number [phone]  8.   Anesthesia type (None, local, MAC, general) ? NOT LISTED  PT ALSO HAS APPT SCHEDULED FOR 10-05-2018  Tiffany Salazar 10/04/2018, 7:17 AM  _________________________________________________________________   (provider comments below)

## 2018-10-04 NOTE — Progress Notes (Deleted)
Cardiology Office Note   Date:  10/04/2018   ID:  Tiffany Salazar, DOB 10/24/1980, MRN [id number]  PCP:  Westley ChandlerBrown, Carina M, MD  Cardiologist:  Benny Lennertandolph  No chief complaint on file.    History of Present Illness: Tiffany Salazar is a 38 y.o. female who presents for ongoing assessment and management of endocarditis of the tricuspid valve. She was seen on consultation on 03/22/2018 after [place] [facility] for TV endocarditis and MRSA bacteremia with associated septic pulmonary emboli. Other history includes IV drug use, Hepatitis C and polysubstance abuse.   [place] [facility] [place] She was seen by Dr. Duke Salviaandolph on who reviewed echocardiogram from previous hospital with findings of echogenic mobile mass on Tricuspid valve leaflets and also in the right ventricular chamber consistent vegetations. There was trace TR.  She was treated with daptomycin and Bactrim per ID. She was also started on suboxone for withdrawal and also a nicotine patch.   She ultimately required TR replacement after repeat echo revealed 2 large mobile vegetations of the TV with severe TR and high embolic potential. She had tricuspid valve replacement by Dr. Laneta SimmersBartle using a 27 mm Magna Ease Mitral Valve pericardial bioprosthesis.   After 45 day hospitalization she was discharged on 05/04/2018. She was to follow up with dentist and also PCP, along with ID on discharge. She will need SBE prophylaxis for dental procedures.   She took a pregnancy test at home and was found to be positive. She was considered a high risk pregnancy.   She is hear today for pre-operative evaluation prior to having dental surgery with Dr.Sadler on 10/11/2018/     Past Medical History:  Diagnosis Date  . Complication of anesthesia    problems getting numb  . Hepatitis C   . MRSA infection 03/21/2015  . Subdural hematoma (HCC) 12/26/2013  . Substance use disorder   . Tricuspid valve replaced     Past  Surgical History:  Procedure Laterality Date  . CESAREAN SECTION  x 3  . TEE WITHOUT CARDIOVERSION N/A 03/23/2018   Procedure: TRANSESOPHAGEAL ECHOCARDIOGRAM (TEE);  Surgeon: Chrystie NoseHilty, Kenneth C, MD;  Location[facility] ENDOSCOPY;  Service: Cardiovascular;  Laterality: N/A;  . TEE WITHOUT CARDIOVERSION N/A 03/29/2018   Procedure: TRANSESOPHAGEAL ECHOCARDIOGRAM (TEE);  Surgeon: Alleen BorneBartle, Bryan K, MD;  [facility];  Service: Open Heart Surgery;  Laterality: N/A;  . TRICUSPID VALVE REPLACEMENT N/A 03/29/2018   Procedure: TRICUSPID VALVE REPLACEMENT Using 27 mm Magna Ease Mitral Valve Pericardial Bioprosthesis;  Surgeon: Alleen BorneBartle, Bryan K, MD;  [facility];  Service: Open Heart Surgery;  Laterality: N/A;     Current Outpatient Medications  Medication Sig Dispense Refill  . buprenorphine (SUBUTEX) 8 MG SUBL SL tablet Place under the tongue daily.    . FEROSUL 325 (65 Fe) MG tablet Take 325 mg by mouth daily.    . Magnesium 200 MG TABS Take 2 tablets (400 mg total) by mouth at bedtime. 60 each 3  . nicotine (NICODERM CQ - DOSED IN MG/24 HOURS) 21 mg/24hr patch Place 1 patch (21 mg total) onto the skin daily. 28 patch 0  . Prenatal Vit-Fe Fumarate-FA (PRENATAL MULTIVITAMIN) TABS tablet Take 1 tablet by mouth daily at 12 noon.     No current facility-administered medications for this visit.     Allergies:   Gentamicin and Vancomycin    Social History:  The patient  reports that she has been smoking cigarettes.  She has been smoking about 1.00 pack per day. She uses smokeless tobacco. She reports previous alcohol use. She reports previous drug use. Drugs: IV, Cocaine, Heroin, and Marijuana.   Family History:  The patient's family history includes Alcohol abuse in her father; Breast cancer (age of onset: 71) in her mother; Thyroid disease in her mother.    ROS: All other systems are reviewed and negative. Unless otherwise mentioned in H&P    PHYSICAL EXAM: VS:  LMP 06/29/2018  , BMI There is no height  or weight on file to calculate BMI. GEN: Well nourished, well developed, in no acute distress HEENT: normal Neck: no JVD, carotid bruits, or masses Cardiac: ***RRR; no murmurs, rubs, or gallops,no edema  Respiratory:  Clear to auscultation bilaterally, normal work of breathing GI: soft, nontender, nondistended, + BS MS: no deformity or atrophy Skin: warm and dry, no rash Neuro:  Strength and sensation are intact Psych: euthymic mood, full affect   EKG:  EKG {ACTION; IS/IS [id number]} ordered today. The ekg ordered today demonstrates ***   Recent Labs: 03/30/2018: Magnesium 2.5 08/04/2018: Hemoglobin 12.4; Platelets 215 09/20/2018: ALT 9; BUN 14; Creatinine, Ser 0.71; Potassium 4.1; Sodium 139    Lipid Panel No results found for: CHOL, TRIG, HDL, CHOLHDL, VLDL, LDLCALC, LDLDIRECT    Wt Readings from Last 3 Encounters:  09/21/18 67 lb (30.4 kg)  09/20/18 190 lb 4.8 oz (86.3 kg)  08/02/18 181 lb (82.1 kg)      Other studies Reviewed: TEE 03/23/2018 LV EF: 55% - 60% Study Conclusions  - Left ventricle: The cavity size was normal. Wall thickness was normal. Systolic function was normal. The estimated ejection fraction was in the range of 55% to 60%. Wall motion was normal; there were no regional wall motion abnormalities. - Aortic valve: No evidence of vegetation. - Mitral valve: No evidence of vegetation. - Left atrium: No evidence of thrombus in the atrial cavity or appendage. - Right atrium: No evidence of thrombus in the atrial cavity or appendage. - Atrial septum: No defect or patent foramen ovale was identified. - Tricuspid valve: Large 0.5 x 1 cm vegetations of the septal and posterior leafelts (seen in 2D and 3D views) with associated severe, wide-open TR. Peak RV-RA gradient (S): 88 mm Hg. Regurgitant VTI: 88 cm. - Pulmonic valve: No evidence of vegetation.  Impressions:  - 2 large mobile vegetations of the TV with severe TR and  high embolic potential. Consider CT surgical consult for debridement and TV replacement.  ASSESSMENT AND PLAN:  1.  ***   Current medicines are reviewed at length with the patient today.    Labs/ tests ordered today include: *** Bettey Mare. Liborio Nixon, ANP, [facility]   10/04/2018 7:38 AM    [facility] [address] Office [phone] Fax [phone]

## 2018-10-05 ENCOUNTER — Ambulatory Visit: Payer: Self-pay | Admitting: Adult Health

## 2018-10-05 MED ORDER — AMOXICILLIN 500 MG PO CAPS: ORAL_CAPSULE | ORAL | 3 refills | Status: DC

## 2018-10-05 NOTE — Telephone Encounter (Signed)
   Primary Cardiologist:Tiffany Duke Salvia, MD  Chart reviewed as part of pre-operative protocol coverage. Because of Tatsuko Christians Horan's past medical history and time since last visit, he/she will require a follow-up visit in order to better assess preoperative cardiovascular risk.  Pre-op covering staff: - Please schedule appointment and call patient to inform them. - Please contact requesting surgeon's office via preferred method (i.e, phone, fax) to inform them of need for appointment prior to surgery.  If applicable, this message will also be routed to pharmacy pool and/or primary cardiologist for input on holding anticoagulant/antiplatelet agent as requested below so that this information is available at time of patient's appointment.   She missed her appointment today with Joni Reining, she will need a cardiology appointment. Also need to clarify the time of dental procedure needed. Any invasive dental procedure will need SBE prophylaxis given her history of valve repair and endocarditis. Will forward to our pharmacist to review as well.   [place], [place]  10/05/2018, 1:40 PM

## 2018-10-05 NOTE — Telephone Encounter (Signed)
Patient does not take any medications that she would need to hold prior to dental exam. She does have a history of endocarditis and bioprosthetic valve replacement so she will require pre op antibiotics. Please inform patient that rx for amoxicillin 2g has been sent to [facility] on [address]

## 2018-10-05 NOTE — Addendum Note (Signed)
Addended by: SUPPLE, MEGAN E on: 10/05/2018 04:23 PM   Modules accepted: Orders

## 2018-10-05 NOTE — Telephone Encounter (Addendum)
Called and spoke with the patient she forgot about her scheduled appointment  with Joni Reining, DNP, AACC on 10-05-2018. She was rescheduled for Wednesday 10-06-2018 with [name], PA-C.

## 2018-10-06 ENCOUNTER — Ambulatory Visit: Payer: Self-pay | Admitting: Physician Assistant

## 2018-10-06 NOTE — Telephone Encounter (Signed)
Patient no showed for a second appointment in 2 days. At this time, I am unable to clear the patient to medical perspective. Please inform requesting provider of this. Also, her case was reviewed by our clinical pharmacist, please inform requesting provider that patient will need antibiotics for any dental procedure in the future given h/o valve repair and endocarditis.

## 2018-10-06 NOTE — Telephone Encounter (Signed)
Note routed to Dr Delfina Redwood office via [employer].

## 2018-10-07 ENCOUNTER — Encounter: Payer: Self-pay | Admitting: *Deleted

## 2018-10-11 ENCOUNTER — Encounter: Payer: Self-pay | Admitting: *Deleted

## 2018-10-11 ENCOUNTER — Ambulatory Visit: Payer: Self-pay | Admitting: Adult Health

## 2018-10-12 ENCOUNTER — Encounter: Payer: Self-pay | Admitting: Physician Assistant

## 2018-10-12 ENCOUNTER — Encounter: Payer: Self-pay | Admitting: General Practice

## 2018-10-12 ENCOUNTER — Ambulatory Visit (INDEPENDENT_AMBULATORY_CARE_PROVIDER_SITE_OTHER): Payer: Medicaid Other | Admitting: Physician Assistant

## 2018-10-12 VITALS — BP 109/64 | HR 68 | Ht 67.0 in | Wt 197.8 lb

## 2018-10-12 DIAGNOSIS — Z01810 Encounter for preprocedural cardiovascular examination: Secondary | ICD-10-CM | POA: Diagnosis not present

## 2018-10-12 DIAGNOSIS — R0600 Dyspnea, unspecified: Secondary | ICD-10-CM

## 2018-10-12 DIAGNOSIS — B1920 Unspecified viral hepatitis C without hepatic coma: Secondary | ICD-10-CM

## 2018-10-12 DIAGNOSIS — Z954 Presence of other heart-valve replacement: Secondary | ICD-10-CM | POA: Diagnosis not present

## 2018-10-12 NOTE — Progress Notes (Signed)
Patient came into office concerned she has seen some brown spotting today. Patient denies pain or recent intercourse. Patient states she went to the ER the day after her appt here because she started to have spotting after her pap smear for 2 days. Patient states she was checked out and everything was fine and hasn't seen anything since then until this morning. Reassured patient that spotting can be common in early pregnancy and usually isn't concerning unless associated with bleeding like a period or pain. Patient verbalized understanding & had no questions.

## 2018-10-12 NOTE — Patient Instructions (Signed)
Medication Instructions:  Your physician recommends that you continue on your current medications as directed. Please refer to the Current Medication list given to you today.  If you need a refill on your cardiac medications before your next appointment, please call your pharmacy.   Lab work:  NONE  If you have labs (blood work) drawn today and your tests are completely normal, you will receive your results only by: Marland Kitchen MyChart Message (if you have MyChart) OR . A paper copy in the mail If you have any lab test that is abnormal or we need to change your treatment, we will call you to review the results.  Testing/Procedures: NONE  Follow-Up: At [facility], you and your health needs are our priority.  As part of our continuing mission to provide you with exceptional heart care, we have created designated Provider Care Teams.  These Care Teams include your primary Cardiologist (physician) and Advanced Practice Providers (APPs -  Physician Assistants and Nurse Practitioners) who all work together to provide you with the care you need, when you need it. You will need a follow up appointment in 7-8 months (Sept/Oct. 2020)  Please call our office in Jul/Aug 2020 to schedule this appointment.  You may see Chilton Si, MD

## 2018-10-12 NOTE — Progress Notes (Signed)
Cardiology Office Note    Date:  10/14/2018   ID:  Tiffany, Salazar 04-Jan-1981, MRN [id number]  PCP:  Westley Chandler, MD  Cardiologist:  Dr. Duke Salvia   No chief complaint on file.   History of Present Illness:  Tiffany Salazar is a 38 y.o. 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  TEE in July 2019 revealed a 0.5 x 1 cm vegetation on the septal leaflet of the tricuspid valve, this was accompanied by severe tricuspid regurgitation.  She was treated at outside hospital and was started on vancomycin and gentamicin.  However she developed a red man syndrome.  This was switched to daptomycin and Bactrim.  Her hospitalization was complicated by septic pulmonary emboli.  She eventually underwent tricuspid replacement using a 27 mm [facility] Ease pericardial valve on 03/29/2018.  Postoperative course was uncomplicated.  Echocardiogram obtained on 05/24/2018 showed EF 55 to 60%, bioprosthetic tricuspid valve present with trivial regurgitation, otherwise no significant valvular issue, PA peak pressure 28 mmHg.  Patient presents today for cardiology office visit.  She denies any chest pain or shortness of breath.  On physical exam, she does not have any heart murmur.  She sometimes has some shortness of breath when she bends over however can ride a elliptical for 10 minutes without any shortness of breath.  She does not appear to be volume overloaded on physical exam.  I think her dyspnea may be due to the pregnancy.  There is no evidence of valvular issue at this time.   Past Medical History:  Diagnosis Date  . Complication of anesthesia    problems getting numb  . Hepatitis C   . MRSA infection 03/21/2015  . Subdural hematoma (HCC) 12/26/2013  . Substance use disorder   . Tricuspid valve replaced     Past Surgical History:  Procedure Laterality Date  . CESAREAN SECTION  x 3  . TEE WITHOUT CARDIOVERSION N/A 03/23/2018     Procedure: TRANSESOPHAGEAL ECHOCARDIOGRAM (TEE);  Surgeon: Chrystie Nose, MD;  Location: [facility] ENDOSCOPY;  Service: Cardiovascular;  Laterality: N/A;  . TEE WITHOUT CARDIOVERSION N/A 03/29/2018   Procedure: TRANSESOPHAGEAL ECHOCARDIOGRAM (TEE);  Surgeon: Alleen Borne, MD;  Location: [facility] OR;  Service: Open Heart Surgery;  Laterality: N/A;  . TRICUSPID VALVE REPLACEMENT N/A 03/29/2018   Procedure: TRICUSPID VALVE REPLACEMENT Using 27 mm Magna Ease Mitral Valve Pericardial Bioprosthesis;  Surgeon: Alleen Borne, MD;  Location: [facility] OR;  Service: Open Heart Surgery;  Laterality: N/A;    Current Medications: Outpatient Medications Prior to Visit  Medication Sig Dispense Refill  . amoxicillin (AMOXIL) 500 MG capsule Take 4 capsules by mouth 1 hour prior to dental appt. 4 capsule 3  . buprenorphine (SUBUTEX) 8 MG SUBL SL tablet Place under the tongue daily.    . FEROSUL 325 (65 Fe) MG tablet Take 325 mg by mouth daily.    . Magnesium 200 MG TABS Take 2 tablets (400 mg total) by mouth at bedtime. 60 each 3  . nicotine (NICODERM CQ - DOSED IN MG/24 HOURS) 21 mg/24hr patch Place 1 patch (21 mg total) onto the skin daily. 28 patch 0  . Prenatal Vit-Fe Fumarate-FA (PRENATAL MULTIVITAMIN) TABS tablet Take 1 tablet by mouth daily at 12 noon.     No facility-administered medications prior to visit.      Allergies:   Gentamicin and Vancomycin   Social History   Socioeconomic History  .  Marital status: Single    Spouse name: Not on file  . Number of children: Not on file  . Years of education: Not on file  . Highest education level: Not on file  Occupational History  . Occupation: unemployed  Social Needs  . Financial resource strain: Not on file  . Food insecurity:    Worry: Never true    Inability: Never true  . Transportation needs:    Medical: No    Non-medical: No  Tobacco Use  . Smoking status: Current Every Day Smoker    Packs/day: 1.00    Types: Cigarettes  . Smokeless tobacco:  Current User  Substance and Sexual Activity  . Alcohol use: Not Currently    Alcohol/week: 0.0 standard drinks    Comment: Pt stated she has started goint ot rehab clinic  . Drug use: Not Currently    Types: IV, Cocaine, Heroin, Marijuana    Comment: Cocaine last use was 04/17/2016. Heroin last use was 04/18/2016. Methadone last use was 04/12/2016. Marijuana 04/18/2016.  Marland Kitchen. Sexual activity: Yes    Partners: Female, Female    Birth control/protection: None  Lifestyle  . Physical activity:    Days per week: Not on file    Minutes per session: Not on file  . Stress: Not on file  Relationships  . Social connections:    Talks on phone: Not on file    Gets together: Not on file    Attends religious service: Not on file    Active member of club or organization: Not on file    Attends meetings of clubs or organizations: Not on file    Relationship status: Not on file  Other Topics Concern  . Not on file  Social History Narrative   The patient has 3 daughters.  Her oldest daughter was born in 2003 cesarean delivery.  Her middle daughter was born in 2012.  Her youngest daughter was born in the fall 2018.      She is currently living with her boyfriend whose name is Mellody DanceKeith.  Reports her and Mellody DanceKeith recently got engaged as a fall 2019.     Family History:  The patient's family history includes Alcohol abuse in her father; Breast cancer (age of onset: 4154) in her mother; Thyroid disease in her mother.   ROS:   Please see the history of present illness.    ROS All other systems reviewed and are negative.   PHYSICAL EXAM:   VS:  BP 109/64   Pulse 68   Ht 5\' 7"  (1.702 m)   Wt 197 lb 12.8 oz (89.7 kg)   LMP 06/29/2018   BMI 30.98 kg/m    GEN: Well nourished, well developed, in no acute distress  HEENT: normal  Neck: no JVD, carotid bruits, or masses Cardiac: RRR; no murmurs, rubs, or gallops,no edema  Respiratory:  clear to auscultation bilaterally, normal work of breathing GI: soft,  nontender, nondistended, + BS MS: no deformity or atrophy  Skin: warm and dry, no rash Neuro:  Alert and Oriented x 3, Strength and sensation are intact Psych: euthymic mood, full affect  Wt Readings from Last 3 Encounters:  10/12/18 197 lb 12.8 oz (89.7 kg)  09/21/18 67 lb (30.4 kg)  09/20/18 190 lb 4.8 oz (86.3 kg)      Studies/Labs Reviewed:   EKG:  EKG is ordered today.  The ekg ordered today demonstrates normal sinus rhythm without significant ST-T wave changes  Recent Labs: 03/30/2018: Magnesium 2.5 08/04/2018:  Hemoglobin 12.4; Platelets 215 09/20/2018: ALT 9; BUN 14; Creatinine, Ser 0.71; Potassium 4.1; Sodium 139   Lipid Panel No results found for: CHOL, TRIG, HDL, CHOLHDL, VLDL, LDLCALC, LDLDIRECT  Additional studies/ records that were reviewed today include:   Echo 05/24/2018 LV EF: 55% -   60% Study Conclusions  - Left ventricle: The cavity size was normal. Systolic function was   normal. The estimated ejection fraction was in the range of 55%   to 60%. Wall motion was normal; there were no regional wall   motion abnormalities. - Aortic valve: There was no significant regurgitation. - Mitral valve: There was trivial regurgitation. - Left atrium: The atrium was moderately dilated. - Right ventricle: Systolic function was normal. - Right atrium: The atrium was moderately to severely dilated. - Atrial septum: No defect or patent foramen ovale was identified. - Tricuspid valve: Prior procedures included surgical repair. A 2.7   cmbioprosthesis was present. There was trivial regurgitation. - Pulmonic valve: There was trivial regurgitation. - Pulmonary arteries: PA peak pressure: 28 mm Hg (S).  Impressions:  - S/P TV replacement. Valve appears well seated, trivial TR   centrally. EF normal. No other significant valve disease.    ASSESSMENT:    1. Preop cardiovascular exam   2. S/P tricuspid valve replacement   3. Dyspnea, unspecified type   4. Hepatitis C  virus infection without hepatic coma, unspecified chronicity      PLAN:  In order of problems listed above:  1. Preoperative clearance: She is cleared to proceed with dental surgery.  Amoxicillin 2 g has been sent to her pharmacy, she will take amoxicillin 1 hour prior to the dental procedure.  She will need SBE prophylaxis for future dental procedures as well  2. History of endocarditis status post tricuspid valve replacement: Stable on physical exam.  No significant heart murmur noted.  Given lack of symptom, I did not repeat echocardiogram  3. Dyspnea: Her dyspnea is not related to exertion however more so with body positions.  Some of this may be more related to pregnancy  4. History of hepatitis C: Monitored by OB/GYN during pregnancy.    Medication Adjustments/Labs and Tests Ordered: Current medicines are reviewed at length with the patient today.  Concerns regarding medicines are outlined above.  Medication changes, Labs and Tests ordered today are listed in the Patient Instructions below. Patient Instructions  Medication Instructions:  Your physician recommends that you continue on your current medications as directed. Please refer to the Current Medication list given to you today.  If you need a refill on your cardiac medications before your next appointment, please call your pharmacy.   Lab work:  NONE  If you have labs (blood work) drawn today and your tests are completely normal, you will receive your results only by: Marland Kitchen. MyChart Message (if you have MyChart) OR . A paper copy in the mail If you have any lab test that is abnormal or we need to change your treatment, we will call you to review the results.  Testing/Procedures: NONE  Follow-Up: At [facility], you and your health needs are our priority.  As part of our continuing mission to provide you with exceptional heart care, we have created designated Provider Care Teams.  These Care Teams include your primary  Cardiologist (physician) and Advanced Practice Providers (APPs -  Physician Assistants and Nurse Practitioners) who all work together to provide you with the care you need, when you need it. You will need a  follow up appointment in 7-8 months (Sept/Oct. 2020)  Please call our office in Jul/Aug 2020 to schedule this appointment.  You may see Chilton Si, MD        Signed, [name], [place]  10/14/2018 11:57 PM    [facility] [address] [place], [place], [place]  [address] Phone: [phone]; Fax: [phone]

## 2018-10-20 ENCOUNTER — Ambulatory Visit (INDEPENDENT_AMBULATORY_CARE_PROVIDER_SITE_OTHER): Payer: Medicaid Other | Admitting: Obstetrics & Gynecology

## 2018-10-20 VITALS — BP 101/64 | HR 73 | Wt 194.0 lb

## 2018-10-20 DIAGNOSIS — F1120 Opioid dependence, uncomplicated: Secondary | ICD-10-CM

## 2018-10-20 DIAGNOSIS — O99612 Diseases of the digestive system complicating pregnancy, second trimester: Secondary | ICD-10-CM

## 2018-10-20 DIAGNOSIS — O99320 Drug use complicating pregnancy, unspecified trimester: Secondary | ICD-10-CM

## 2018-10-20 DIAGNOSIS — Z3A16 16 weeks gestation of pregnancy: Secondary | ICD-10-CM

## 2018-10-20 DIAGNOSIS — O99613 Diseases of the digestive system complicating pregnancy, third trimester: Secondary | ICD-10-CM

## 2018-10-20 DIAGNOSIS — O0990 Supervision of high risk pregnancy, unspecified, unspecified trimester: Secondary | ICD-10-CM

## 2018-10-20 DIAGNOSIS — K5900 Constipation, unspecified: Secondary | ICD-10-CM

## 2018-10-20 DIAGNOSIS — O99322 Drug use complicating pregnancy, second trimester: Secondary | ICD-10-CM

## 2018-10-20 DIAGNOSIS — Z953 Presence of xenogenic heart valve: Secondary | ICD-10-CM

## 2018-10-20 DIAGNOSIS — O0992 Supervision of high risk pregnancy, unspecified, second trimester: Secondary | ICD-10-CM

## 2018-10-20 HISTORY — DX: Drug use complicating pregnancy, unspecified trimester: O99.320

## 2018-10-20 HISTORY — DX: Opioid dependence, uncomplicated: F11.20

## 2018-10-20 MED ORDER — POLYETHYLENE GLYCOL 3350 17 G PO PACK: 17.0000 g | PACK | Freq: Two times a day (BID) | ORAL | 5 refills | Status: DC

## 2018-10-20 MED ORDER — PREPLUS 27-1 MG PO TABS: 1.0000 | ORAL_TABLET | Freq: Every day | ORAL | 13 refills | Status: DC

## 2018-10-20 MED ORDER — DOCUSATE SODIUM 100 MG PO CAPS: 100.0000 mg | ORAL_CAPSULE | Freq: Two times a day (BID) | ORAL | 2 refills | Status: DC | PRN

## 2018-10-20 NOTE — Patient Instructions (Signed)

## 2018-10-20 NOTE — Progress Notes (Signed)
   PRENATAL VISIT NOTE  Subjective:  Tiffany Salazar is a 38 y.o. [phone] at [redacted]w[redacted]d being seen today for ongoing prenatal care.  She is currently monitored for the following issues for this high-risk pregnancy and has Substance use disorder; Previous cesarean section complicating pregnancy, antepartum condition or complication; Endocarditis of tricuspid valve; Hepatitis C antibody positive in blood; High-risk pregnancy in first trimester; Tobacco use disorder; Supervision of high risk pregnancy, antepartum; History of tricuspid valve replacement with bioprosthetic valve; AMA (advanced maternal age) multigravida 35+, unspecified trimester; and Pregnancy complicated by subutex maintenance, antepartum (HCC) on their problem list.  Patient reports bad constipation not alleviated with Milk of Magnesia, fiber etc. Has this all the time due to Subutex, worsened due to pregnancy. Has not had BM in one week.  Contractions: Not present. Vag. Bleeding: None.  Movement: Present. Denies leaking of fluid.   The following portions of the patient's history were reviewed and updated as appropriate: allergies, current medications, past family history, past medical history, past social history, past surgical history and problem list. Problem list updated.  Objective:   Vitals:   10/20/18 0959  BP: 101/64  Pulse: 73  Weight: 194 lb (88 kg)    Fetal Status: Fetal Heart Rate (bpm): 150   Movement: Present     General:  Alert, oriented and cooperative. Patient is in no acute distress.  Skin: Skin is warm and dry. No rash noted.   Cardiovascular: Normal heart rate noted  Respiratory: Normal respiratory effort, no problems with respiration noted  Abdomen: Soft, gravid, appropriate for gestational age.  Pain/Pressure: Absent     Pelvic: Cervical exam deferred        Extremities: Normal range of motion.  Edema: None  Mental Status: Normal mood and affect. Normal behavior. Normal judgment and thought content.    Assessment and Plan:  Pregnancy: [id number] at [redacted]w[redacted]d  1. History of tricuspid valve replacement with bioprosthetic valve Has seen Cardiology, normal ECHO in 04/2018, no ECHO needed as per provider. No anticoagulation needed.  2. Pregnancy complicated by subutex maintenance, antepartum (HCC) Continue Subutex therapy  3. Constipation during pregnancy in third trimester Miralax and Colace prescribed - polyethylene glycol (MIRALAX) packet; Take 17 g by mouth 2 (two) times daily. May increase to three times a day if no result  Dispense: 14 each; Refill: 5 - docusate sodium (COLACE) 100 MG capsule; Take 1 capsule (100 mg total) by mouth 2 (two) times daily as needed for mild constipation or moderate constipation.  Dispense: 30 capsule; Refill: 2  4. Supervision of high risk pregnancy, antepartum PNV refilled. Anatomy scan scheduled. Low risk NIPS. AFP only today. - Prenatal Vit-Fe Fumarate-FA (PREPLUS) 27-1 MG TABS; Take 1 tablet by mouth daily.  Dispense: 30 tablet; Refill: 13 - AFP, Serum, Open Spina Bifida No other complaints or concerns.  Routine obstetric precautions reviewed. Please refer to After Visit Summary for other counseling recommendations.  Return in about 4 weeks (around 11/17/2018) for OB Visit (HOB).  Future Appointments  Date Time Provider Department Center  11/09/2018 10:30 AM [facility] [place] 1 [facility] M[facility]-US    Jaynie Collins, MD

## 2018-10-23 LAB — AFP, SERUM, OPEN SPINA BIFIDA
AFP MoM: 1.29
AFP Value: 37.8 ng/mL
Gest. Age on Collection Date: 16.3 weeks
Maternal Age At EDD: 36.3 yr
OSBR Risk 1 IN: 4947
Test Results:: NEGATIVE
Weight: 194 [lb_av]

## 2018-11-02 ENCOUNTER — Encounter (HOSPITAL_COMMUNITY): Payer: Self-pay

## 2018-11-09 ENCOUNTER — Other Ambulatory Visit: Payer: Self-pay

## 2018-11-09 ENCOUNTER — Encounter (HOSPITAL_COMMUNITY): Payer: Self-pay

## 2018-11-09 ENCOUNTER — Ambulatory Visit (HOSPITAL_COMMUNITY)
Admission: RE | Admit: 2018-11-09 | Discharge: 2018-11-09 | Disposition: A | Discharge status: Home / Self Care | Payer: Medicaid Other | Source: Ambulatory Visit | Attending: Family Medicine | Admitting: Family Medicine

## 2018-11-09 ENCOUNTER — Other Ambulatory Visit (HOSPITAL_COMMUNITY): Payer: Self-pay | Admitting: *Deleted

## 2018-11-09 ENCOUNTER — Ambulatory Visit (HOSPITAL_COMMUNITY): Payer: Medicaid Other | Admitting: *Deleted

## 2018-11-09 VITALS — BP 105/53 | HR 71 | Temp 98.3°F | Wt 196.6 lb

## 2018-11-09 DIAGNOSIS — O99320 Drug use complicating pregnancy, unspecified trimester: Principal | ICD-10-CM

## 2018-11-09 DIAGNOSIS — F1120 Opioid dependence, uncomplicated: Secondary | ICD-10-CM

## 2018-11-09 DIAGNOSIS — Z3A19 19 weeks gestation of pregnancy: Secondary | ICD-10-CM

## 2018-11-09 DIAGNOSIS — O99322 Drug use complicating pregnancy, second trimester: Secondary | ICD-10-CM

## 2018-11-09 DIAGNOSIS — O34219 Maternal care for unspecified type scar from previous cesarean delivery: Secondary | ICD-10-CM

## 2018-11-09 DIAGNOSIS — O99212 Obesity complicating pregnancy, second trimester: Secondary | ICD-10-CM

## 2018-11-09 DIAGNOSIS — O09522 Supervision of elderly multigravida, second trimester: Secondary | ICD-10-CM | POA: Diagnosis not present

## 2018-11-09 DIAGNOSIS — O98412 Viral hepatitis complicating pregnancy, second trimester: Secondary | ICD-10-CM

## 2018-11-09 DIAGNOSIS — F1910 Other psychoactive substance abuse, uncomplicated: Secondary | ICD-10-CM

## 2018-11-09 DIAGNOSIS — O09292 Supervision of pregnancy with other poor reproductive or obstetric history, second trimester: Secondary | ICD-10-CM

## 2018-11-09 DIAGNOSIS — B182 Chronic viral hepatitis C: Secondary | ICD-10-CM

## 2018-11-09 DIAGNOSIS — O99332 Smoking (tobacco) complicating pregnancy, second trimester: Secondary | ICD-10-CM

## 2018-11-09 DIAGNOSIS — O0990 Supervision of high risk pregnancy, unspecified, unspecified trimester: Secondary | ICD-10-CM

## 2018-11-09 DIAGNOSIS — Z79891 Long term (current) use of opiate analgesic: Secondary | ICD-10-CM

## 2018-11-18 ENCOUNTER — Ambulatory Visit (INDEPENDENT_AMBULATORY_CARE_PROVIDER_SITE_OTHER): Payer: Medicaid Other | Admitting: Obstetrics and Gynecology

## 2018-11-18 ENCOUNTER — Encounter: Payer: Self-pay | Admitting: Obstetrics and Gynecology

## 2018-11-18 VITALS — BP 79/56 | HR 67 | Wt 198.6 lb

## 2018-11-18 DIAGNOSIS — O09522 Supervision of elderly multigravida, second trimester: Secondary | ICD-10-CM

## 2018-11-18 DIAGNOSIS — O34219 Maternal care for unspecified type scar from previous cesarean delivery: Secondary | ICD-10-CM

## 2018-11-18 DIAGNOSIS — O0990 Supervision of high risk pregnancy, unspecified, unspecified trimester: Secondary | ICD-10-CM

## 2018-11-18 DIAGNOSIS — O0992 Supervision of high risk pregnancy, unspecified, second trimester: Secondary | ICD-10-CM

## 2018-11-18 DIAGNOSIS — Z3A20 20 weeks gestation of pregnancy: Secondary | ICD-10-CM

## 2018-11-18 DIAGNOSIS — O09529 Supervision of elderly multigravida, unspecified trimester: Secondary | ICD-10-CM

## 2018-11-18 DIAGNOSIS — F1990 Other psychoactive substance use, unspecified, uncomplicated: Secondary | ICD-10-CM

## 2018-11-18 NOTE — Progress Notes (Signed)
   TELEHEALTH VIRTUAL OBSTETRICS VISIT ENCOUNTER NOTE  I connected with Tiffany Salazar on 11/18/18 at  9:15 AM EDT by telephone at home and verified that I am speaking with the correct person using two identifiers.   I discussed the limitations, risks, security and privacy concerns of performing an evaluation and management service by telephone and the availability of in person appointments. I also discussed with the patient that there may be a patient responsible charge related to this service. The patient expressed understanding and agreed to proceed.  Subjective:  Tiffany Salazar is a 38 y.o. [phone] at [redacted]w[redacted]d being followed for ongoing prenatal care.  She is currently monitored for the following issues for this high-risk pregnancy and has Substance use disorder; Previous cesarean section complicating pregnancy, antepartum condition or complication; Endocarditis of tricuspid valve; Hepatitis C antibody positive in blood; High-risk pregnancy in first trimester; Tobacco use disorder; Supervision of high risk pregnancy, antepartum; History of tricuspid valve replacement with bioprosthetic valve; AMA (advanced maternal age) multigravida 35+, unspecified trimester; and Pregnancy complicated by subutex maintenance, antepartum (HCC) on their problem list.  Patient reports no complaints. Reports fetal movement. Denies any contractions, bleeding or leaking of fluid.   The following portions of the patient's history were reviewed and updated as appropriate: allergies, current medications, past family history, past medical history, past social history, past surgical history and problem list.   Objective:   General:  Alert, oriented and cooperative.   Mental Status: Normal mood and affect perceived. Normal judgment and thought content.  Rest of physical exam deferred due to type of encounter  Assessment and Plan:  Pregnancy: [id number] at [redacted]w[redacted]d 1. Supervision of high risk pregnancy, antepartum Patient is  doing well without complaints Scheduled for follow up growth ultrasound 4/14 Patient has access to a blood pressure cuff at home and was instructed to take a weekly BP Patient was made aware of telehealth visit in 4 weeks  2. Previous cesarean section complicating pregnancy, antepartum condition or complication Will be scheduled for repeat cesarean section  3. AMA (advanced maternal age) multigravida 35+, unspecified trimester   4. Substance use disorder Stable on subutex 8 mg TID  Preterm labor symptoms and general obstetric precautions including but not limited to vaginal bleeding, contractions, leaking of fluid and fetal movement were reviewed in detail with the patient.  I discussed the assessment and treatment plan with the patient. The patient was provided an opportunity to ask questions and all were answered. The patient agreed with the plan and demonstrated an understanding of the instructions. The patient was advised to call back or seek an in-person office evaluation/go to [facility] at [facility] for any urgent or concerning symptoms. Please refer to After Visit Summary for other counseling recommendations.   No follow-ups on file.  Future Appointments  Date Time Provider Department Center  12/07/2018 10:00 AM [facility] NURSE [facility]-US  12/07/2018 10:00 AM [facility] [place] 3 [facility] M[facility]-US    Catalina Antigua, MD Center for [employer], [facility]

## 2018-11-18 NOTE — Progress Notes (Signed)
Patient reports lower back pain Signed patient up for BRX optimization- has cuff at home to use. Patient reported a couple abnormal BP readings, told patient to bring her cuff by the office to compare with ours for accuracy. Patient instructed to check BP weekly

## 2018-11-18 NOTE — Progress Notes (Addendum)
Pt came to office with her home BP cuff in order to verify accuracy. BP taken with machine in office - 93/56, P - 72.  After 2 minutes, pt then used home BP cuff - 174/56, P - 73. Pt was advised not to use her cuff for weekly readings. She was given BP cuff with instructions to pair it with Bab[employer]s in order to enter readings to the App. She may call office if she has questions. Pt voiced understanding.

## 2018-11-24 ENCOUNTER — Telehealth: Payer: Self-pay

## 2018-11-24 DIAGNOSIS — J302 Other seasonal allergic rhinitis: Secondary | ICD-10-CM

## 2018-11-24 NOTE — Telephone Encounter (Signed)
Pt requests albuterol inhaler to be prescribed.

## 2018-11-25 ENCOUNTER — Encounter: Payer: Self-pay | Admitting: Advanced Practice Midwife

## 2018-11-25 MED ORDER — ALBUTEROL SULFATE HFA 108 (90 BASE) MCG/ACT IN AERS: 2.0000 | INHALATION_SPRAY | Freq: Four times a day (QID) | RESPIRATORY_TRACT | 2 refills | Status: DC | PRN

## 2018-11-25 NOTE — Telephone Encounter (Signed)
Per chart review do not see that she has been on albuterol before and do not see history of asthma. I called [facility] and heard message voicemail is full and will not accept messages. She only has one number listed.

## 2018-11-25 NOTE — Telephone Encounter (Signed)
Discussed with Dr. Adrian Blackwater and albuterol inhaler rx approved.  Also reccommends claritin or zyrtec which I previously discussed with patient. RX sent in to pharmacy. I called [facility] and informed her rx sent to pharmacy and reviewed provider also reccomends she take claritin or zyrtec. She voices understanding.

## 2018-11-25 NOTE — Telephone Encounter (Signed)
Tiffany Salazar left another message this am that she missed our call back. I called Tiffany Salazar and she reports she has seasonal allergies and in the past was prescribe albuterol inhaler prn.  We discussed I did not see listed on meds. She states every since she had open heart surgery in 2019 she  Is hard to breathe at times and states now with her seasonal allergies is worse. She denies fever, states she has a cough sometimes. States she took allergy sinus yesterday which helped some. I asked her to check and see if it has phenylephrine as ingredient. I advised her to stop taking and discussed what she can take safely. She also admits she used to smoke.  I advised her I will discuss with provider and call her back.

## 2018-12-07 ENCOUNTER — Encounter (HOSPITAL_COMMUNITY): Payer: Self-pay

## 2018-12-07 ENCOUNTER — Other Ambulatory Visit (HOSPITAL_COMMUNITY): Payer: Self-pay | Admitting: *Deleted

## 2018-12-07 ENCOUNTER — Ambulatory Visit (HOSPITAL_COMMUNITY)
Admission: RE | Admit: 2018-12-07 | Discharge: 2018-12-07 | Disposition: A | Discharge status: Home / Self Care | Payer: Medicaid Other | Source: Ambulatory Visit | Attending: Obstetrics and Gynecology | Admitting: Obstetrics and Gynecology

## 2018-12-07 ENCOUNTER — Ambulatory Visit (HOSPITAL_COMMUNITY): Payer: Medicaid Other | Admitting: *Deleted

## 2018-12-07 ENCOUNTER — Other Ambulatory Visit: Payer: Self-pay

## 2018-12-07 VITALS — Temp 98.4°F

## 2018-12-07 DIAGNOSIS — F1120 Opioid dependence, uncomplicated: Secondary | ICD-10-CM

## 2018-12-07 DIAGNOSIS — O09522 Supervision of elderly multigravida, second trimester: Secondary | ICD-10-CM | POA: Diagnosis not present

## 2018-12-07 DIAGNOSIS — O99320 Drug use complicating pregnancy, unspecified trimester: Secondary | ICD-10-CM | POA: Insufficient documentation

## 2018-12-07 DIAGNOSIS — Z3A23 23 weeks gestation of pregnancy: Secondary | ICD-10-CM

## 2018-12-07 DIAGNOSIS — Z362 Encounter for other antenatal screening follow-up: Secondary | ICD-10-CM | POA: Diagnosis not present

## 2018-12-07 DIAGNOSIS — O99322 Drug use complicating pregnancy, second trimester: Secondary | ICD-10-CM | POA: Diagnosis not present

## 2018-12-07 DIAGNOSIS — O09523 Supervision of elderly multigravida, third trimester: Secondary | ICD-10-CM

## 2018-12-07 DIAGNOSIS — O0990 Supervision of high risk pregnancy, unspecified, unspecified trimester: Secondary | ICD-10-CM | POA: Diagnosis present

## 2018-12-07 DIAGNOSIS — O34219 Maternal care for unspecified type scar from previous cesarean delivery: Secondary | ICD-10-CM

## 2018-12-07 DIAGNOSIS — B182 Chronic viral hepatitis C: Secondary | ICD-10-CM

## 2018-12-07 DIAGNOSIS — O99332 Smoking (tobacco) complicating pregnancy, second trimester: Secondary | ICD-10-CM

## 2018-12-07 DIAGNOSIS — O09292 Supervision of pregnancy with other poor reproductive or obstetric history, second trimester: Secondary | ICD-10-CM

## 2018-12-07 DIAGNOSIS — O98412 Viral hepatitis complicating pregnancy, second trimester: Secondary | ICD-10-CM

## 2018-12-07 DIAGNOSIS — Z79891 Long term (current) use of opiate analgesic: Secondary | ICD-10-CM

## 2018-12-07 DIAGNOSIS — F1910 Other psychoactive substance abuse, uncomplicated: Secondary | ICD-10-CM

## 2018-12-07 DIAGNOSIS — O99212 Obesity complicating pregnancy, second trimester: Secondary | ICD-10-CM

## 2018-12-07 NOTE — Progress Notes (Unsigned)
Us/

## 2018-12-13 ENCOUNTER — Telehealth: Payer: Self-pay | Admitting: General Practice

## 2018-12-13 NOTE — Telephone Encounter (Signed)
Per review, patient has not entered any BP into babyscripts. Called patient and she states there isn't a section for her to enter her BP in. Gave patient access in [employer]ys[employer] and told her to log out/log back in and it should appear now under the health section. Patient states she is able to see that in the app now and will check her BP after hanging up. Patient had no questions.

## 2018-12-16 ENCOUNTER — Telehealth: Payer: Self-pay | Admitting: Obstetrics & Gynecology

## 2018-12-16 NOTE — Telephone Encounter (Signed)
Called the patient and informed of upcoming visit. Also educated how to download the [employer] webex app and create an account. The patient verbalized understanding.

## 2018-12-17 ENCOUNTER — Ambulatory Visit (INDEPENDENT_AMBULATORY_CARE_PROVIDER_SITE_OTHER): Payer: Medicaid Other | Admitting: Obstetrics and Gynecology

## 2018-12-17 VITALS — BP 92/56

## 2018-12-17 DIAGNOSIS — O0992 Supervision of high risk pregnancy, unspecified, second trimester: Secondary | ICD-10-CM | POA: Diagnosis not present

## 2018-12-17 DIAGNOSIS — O09529 Supervision of elderly multigravida, unspecified trimester: Secondary | ICD-10-CM

## 2018-12-17 DIAGNOSIS — I368 Other nonrheumatic tricuspid valve disorders: Secondary | ICD-10-CM | POA: Diagnosis not present

## 2018-12-17 DIAGNOSIS — F1120 Opioid dependence, uncomplicated: Secondary | ICD-10-CM

## 2018-12-17 DIAGNOSIS — Z3A24 24 weeks gestation of pregnancy: Secondary | ICD-10-CM

## 2018-12-17 DIAGNOSIS — R768 Other specified abnormal immunological findings in serum: Secondary | ICD-10-CM

## 2018-12-17 DIAGNOSIS — Z953 Presence of xenogenic heart valve: Secondary | ICD-10-CM | POA: Diagnosis not present

## 2018-12-17 DIAGNOSIS — O99320 Drug use complicating pregnancy, unspecified trimester: Secondary | ICD-10-CM

## 2018-12-17 DIAGNOSIS — O09522 Supervision of elderly multigravida, second trimester: Secondary | ICD-10-CM

## 2018-12-17 DIAGNOSIS — O0991 Supervision of high risk pregnancy, unspecified, first trimester: Secondary | ICD-10-CM

## 2018-12-17 DIAGNOSIS — O34219 Maternal care for unspecified type scar from previous cesarean delivery: Secondary | ICD-10-CM

## 2018-12-17 DIAGNOSIS — I079 Rheumatic tricuspid valve disease, unspecified: Secondary | ICD-10-CM

## 2018-12-17 DIAGNOSIS — O0990 Supervision of high risk pregnancy, unspecified, unspecified trimester: Secondary | ICD-10-CM

## 2018-12-17 DIAGNOSIS — O99322 Drug use complicating pregnancy, second trimester: Secondary | ICD-10-CM

## 2018-12-17 NOTE — Progress Notes (Signed)
TELEHEALTH VIRTUAL OBSTETRICS VISIT ENCOUNTER NOTE  I connected with@ on 12/17/18 at 10:15 AM EDT by telephone at home and verified that I am speaking with the correct person using two identifiers.   I discussed the limitations, risks, security and privacy concerns of performing an evaluation and management service by telephone and the availability of in person appointments. I also discussed with the patient that there may be a patient responsible charge related to this service. The patient expressed understanding and agreed to proceed.  Subjective:  Tiffany Salazar is a 38 y.o. [phone] at 108w3d being followed for ongoing prenatal care.  She is currently monitored for the following issues for this high-risk pregnancy and has Substance use disorder; Previous cesarean section complicating pregnancy, antepartum condition or complication; Endocarditis of tricuspid valve; Hepatitis C antibody positive in blood; High-risk pregnancy in first trimester; Tobacco use disorder; Supervision of high risk pregnancy, antepartum; History of tricuspid valve replacement with bioprosthetic valve; AMA (advanced maternal age) multigravida 35+, unspecified trimester; Pregnancy complicated by subutex maintenance, antepartum (HCC); and Depression on their problem list.  Patient reports no complaints. Reports fetal movement. Denies any contractions, bleeding or leaking of fluid.   The following portions of the patient's history were reviewed and updated as appropriate: allergies, current medications, past family history, past medical history, past social history, past surgical history and problem list.   Objective:   Vitals:   12/17/18 1008  BP: (!) 92/56    General:  Alert, oriented and cooperative.   Mental Status: Normal mood and affect perceived. Normal judgment and thought content.  Rest of physical exam deferred due to type of encounter  Assessment and Plan:  Pregnancy: [id number] at [redacted]w[redacted]d 1. Endocarditis  of tricuspid valve Per problem list, patient doesn't need to be on anticoagulation May need additional abx coverage at delivery and can f/u with CT closer to delivery Normal echo 04/2018 Doesn't have f/u with CT in the future. Can schedule with them PRN and touch base at around 32-34wks to see if anything special is needed for delivery Continue low dose asa  2. Hepatitis C antibody positive in blood Recommend cmp, liver studies with 28wk labs. Needs GI f/u PP  3. High-risk pregnancy in first trimester D/w her re: btl papers next visit  4. History of tricuspid valve replacement with bioprosthetic valve  5. Supervision of high risk pregnancy, antepartum 28wk labs, ?btl papers nv. Has rpt growth nv  6. AMA (advanced maternal age) multigravida 35+, unspecified trimester No issues  7. Pregnancy complicated by subutex maintenance, antepartum (HCC) Continue. D/w her re: NAS nv  8. Previous cesarean section complicating pregnancy, antepartum condition or complication Needs rpt scheduled and ? BTL (d/w pt)  Preterm labor symptoms and general obstetric precautions including but not limited to vaginal bleeding, contractions, leaking of fluid and fetal movement were reviewed in detail with the patient.  I discussed the assessment and treatment plan with the patient. The patient was provided an opportunity to ask questions and all were answered. The patient agreed with the plan and demonstrated an understanding of the instructions. The patient was advised to call back or seek an in-person office evaluation/go to [facility]U at [facility] for any urgent or concerning symptoms. Please refer to After Visit Summary for other counseling recommendations.   I provided 10 minutes of non-face-to-face time during this encounter. The visit was conducted via [employer] Video-medicine  Return in about 18 days (around 01/04/2019) for rob and 2h GTT.  Future Appointments  Date Time Provider Department  Center  01/04/2019  9:30 AM [facility] NURSE [facility]  01/04/2019  9:30 AM [facility] [place] 1 [facility]    Rockford Bing, MD Center for [employer], [facility]

## 2018-12-26 ENCOUNTER — Other Ambulatory Visit: Payer: Self-pay

## 2018-12-26 ENCOUNTER — Ambulatory Visit (HOSPITAL_COMMUNITY)
Admission: EM | Admit: 2018-12-26 | Discharge: 2018-12-26 | Disposition: A | Discharge status: Home / Self Care | Payer: Medicaid Other | Attending: Emergency Medicine | Admitting: Emergency Medicine

## 2018-12-26 ENCOUNTER — Encounter (HOSPITAL_COMMUNITY): Payer: Self-pay | Admitting: Emergency Medicine

## 2018-12-26 DIAGNOSIS — L237 Allergic contact dermatitis due to plants, except food: Secondary | ICD-10-CM | POA: Diagnosis not present

## 2018-12-26 MED ORDER — PREDNISONE 10 MG (21) PO TBPK: ORAL_TABLET | Freq: Every day | ORAL | 0 refills | Status: DC

## 2018-12-26 NOTE — ED Triage Notes (Signed)
Patient has been working in yard last Sunday.  Monday noticed rash on body.  Patient has splotchy areas of rash on mainly right side of body

## 2018-12-26 NOTE — Discharge Instructions (Signed)
Cool compresses, uses of calamine lotion, and/or use of benadryl as needed to help with itching.  Prednisone pack provided which I hope will help with symptoms as well.  Limit itching to avoid open skin which can lead to infection.  Please return to be seen if develop any signs of infection.

## 2018-12-26 NOTE — ED Provider Notes (Signed)
[facility]    CSN: [id number] Arrival date & time: 12/26/18  1255     History   Chief Complaint Chief Complaint  Patient presents with  . Rash    HPI Tiffany Salazar is a 38 y.o. female.   [name] presents with complaints of itching rash to right arm, abdomen, and bilateral legs. Started approximately 1 week ago, the day after she had been working in the yard and clearing a vine. She now suspects the vine was sumac. She started feeling itchy while working in the yard but thought it was mosquito bites, noticed the rash first to her right arm. It is very itchy. Tight sensation to right arm with some weeping. Has tried cortisone, calamine and clorox topically which have not helped. Hard time sleeping due to itching. Denies  Any previous similar. No other known exposures or allergens. She is approximately [redacted] weeks pregnant. She takes subutex as well as aspirin daily, follows regularly with OB. No abdominal pain, vaginal bleeding or change to fetal movement. Hx of valve replacement. Rash feels somewhat burning but no other pain. No facial, mouth or respiratory involvement or symptoms.     ROS per HPI, negative if not otherwise mentioned.      Past Medical History:  Diagnosis Date  . Complication of anesthesia    problems getting numb  . Hepatitis C   . MRSA infection 03/21/2015  . Subdural hematoma (HCC) 12/26/2013  . Substance use disorder   . Tricuspid valve replaced     Patient Active Problem List   Diagnosis Date Noted  . Pregnancy complicated by subutex maintenance, antepartum (HCC) 10/20/2018  . Supervision of high risk pregnancy, antepartum 09/20/2018  . History of tricuspid valve replacement with bioprosthetic valve 09/20/2018  . AMA (advanced maternal age) multigravida 35+, unspecified trimester 09/20/2018  . High-risk pregnancy in first trimester 08/02/2018  . Tobacco use disorder 08/02/2018  . Hepatitis C antibody positive in blood   . Endocarditis  of tricuspid valve 03/20/2018  . Depression 11/13/2016  . Previous cesarean section complicating pregnancy, antepartum condition or complication 03/21/2015  . Substance use disorder 12/30/2013    Past Surgical History:  Procedure Laterality Date  . CESAREAN SECTION  x 3  . TEE WITHOUT CARDIOVERSION N/A 03/23/2018   Procedure: TRANSESOPHAGEAL ECHOCARDIOGRAM (TEE);  Surgeon: Chrystie Nose, MD;  Location: [facility] ENDOSCOPY;  Service: Cardiovascular;  Laterality: N/A;  . TEE WITHOUT CARDIOVERSION N/A 03/29/2018   Procedure: TRANSESOPHAGEAL ECHOCARDIOGRAM (TEE);  Surgeon: Alleen Borne, MD;  Location: [facility] OR;  Service: Open Heart Surgery;  Laterality: N/A;  . TRICUSPID VALVE REPLACEMENT N/A 03/29/2018   Procedure: TRICUSPID VALVE REPLACEMENT Using 27 mm Magna Ease Mitral Valve Pericardial Bioprosthesis;  Surgeon: Alleen Borne, MD;  Location: [facility] OR;  Service: Open Heart Surgery;  Laterality: N/A;    OB History    Gravida  7   Para  3   Term  3   Preterm      AB  3   Living  3     SAB  1   TAB  2   Ectopic      Multiple      Live Births  3            Home Medications    Prior to Admission medications   Medication Sig Start Date End Date Taking? Authorizing Provider  ASPIRIN 81 PO Take by mouth.   Yes [provider]  buprenorphine (SUBUTEX) 8  MG SUBL SL tablet Place 8 mg under the tongue 3 (three) times daily.    Yes [provider]  Prenatal Vit-Fe Fumarate-FA (PREPLUS) 27-1 MG TABS Take 1 tablet by mouth daily. 10/20/18  Yes Anyanwu, Jethro Bastos, MD  albuterol (PROVENTIL HFA;VENTOLIN HFA) 108 (90 Base) MCG/ACT inhaler Inhale 2 puffs into the lungs every 6 (six) hours as needed for wheezing or shortness of breath. 11/25/18   Levie Heritage, DO  FEROSUL 325 (65 Fe) MG tablet Take 325 mg by mouth daily. 08/05/18   [provider]  polyethylene glycol (MIRALAX) packet Take 17 g by mouth 2 (two) times daily. May increase to three times a day if no  result 10/20/18   Anyanwu, Jethro Bastos, MD  predniSONE (STERAPRED UNI-PAK 21 TAB) 10 MG (21) TBPK tablet Take by mouth daily. Per box instruct 12/26/18   Georgetta Haber, NP    Family History Family History  Problem Relation Age of Onset  . Thyroid disease Mother   . Breast cancer Mother 7       breast  . Alcohol abuse Father     Social History Social History   Tobacco Use  . Smoking status: Current Every Day Smoker    Packs/day: 1.00    Types: Cigarettes  . Smokeless tobacco: Current User  Substance Use Topics  . Alcohol use: Not Currently    Alcohol/week: 0.0 standard drinks    Comment: Pt stated she has started goint ot rehab clinic  . Drug use: Not Currently    Types: IV, Cocaine, Heroin, Marijuana    Comment: Cocaine last use was 04/17/2016. Heroin last use was 04/18/2016. Methadone last use was 04/12/2016. Marijuana 04/18/2016.     Allergies   Gentamicin and Vancomycin   Review of Systems Review of Systems   Physical Exam Triage Vital Signs ED Triage Vitals  Enc Vitals Group     BP 12/26/18 1305 101/66     Pulse Rate 12/26/18 1305 81     Resp 12/26/18 1305 18     Temp 12/26/18 1305 98 F (36.7 C)     Temp Source 12/26/18 1305 Oral     SpO2 12/26/18 1305 97 %     Weight --      Height --      Head Circumference --      Peak Flow --      Pain Score 12/26/18 1303 0     Pain Loc --      Pain Edu? --      Excl. in GC? --    No data found.  Updated Vital Signs BP 101/66 (BP Location: Left Arm)   Pulse 81   Temp 98 F (36.7 C) (Oral)   Resp 18   LMP 06/29/2018   SpO2 97%    Physical Exam Constitutional:      Appearance: Normal appearance.  Cardiovascular:     Rate and Rhythm: Normal rate.  Pulmonary:     Effort: Pulmonary effort is normal. No respiratory distress.     Breath sounds: Normal breath sounds.        Skin:    Findings: Rash present.     Comments: Red raised scattered rash to right arm, bilateral legs, thighs, ankles and right  abdomen; right arm with a few open vesicles with clear drainange; see photos; no apparent abscess or cellulitic features   Neurological:     Mental Status: She is alert.      UC Treatments /  Results  Labs (all labs ordered are listed, but only abnormal results are displayed) Labs Reviewed - No data to display  EKG None  Radiology No results found.  Procedures Procedures (including critical care time)  Medications Ordered in UC Medications - No data to display  Initial Impression / Assessment and Plan / UC Course  I have reviewed the triage vital signs and the nursing notes.  Pertinent labs & imaging results that were available during my care of the patient were reviewed by me and considered in my medical decision making (see chart for details).     Extensive rash, appears consistent with plant exposure dermatitis, even more extensive than depicted with photos. Significant itching. No indication of infectious process at this time. Ob on-call confirmed ok to provide prednisone. Topical treatments discussed for comfort as well. Return precautions provided. Patient verbalized understanding and agreeable to plan.   Final Clinical Impressions(s) / UC Diagnoses   Final diagnoses:  Allergic contact dermatitis due to plants, except food     Discharge Instructions     Cool compresses, uses of calamine lotion, and/or use of benadryl as needed to help with itching.  Prednisone pack provided which I hope will help with symptoms as well.  Limit itching to avoid open skin which can lead to infection.  Please return to be seen if develop any signs of infection.    ED Prescriptions    Medication Sig Dispense Auth. Provider   predniSONE (STERAPRED UNI-PAK 21 TAB) 10 MG (21) TBPK tablet Take by mouth daily. Per box instruct 21 tablet Georgetta HaberBurky, Natalie B, NP     Controlled Substance Prescriptions [place] Controlled Substance Registry consulted? Not Applicable   Georgetta HaberBurky, Natalie B, NP  12/26/18 1415

## 2019-01-03 ENCOUNTER — Telehealth: Payer: Self-pay | Admitting: Obstetrics & Gynecology

## 2019-01-03 NOTE — Telephone Encounter (Signed)
Called the patient to confirm the appointment, the patient verbalized understanding.  Informed of fasting

## 2019-01-04 ENCOUNTER — Ambulatory Visit (HOSPITAL_COMMUNITY): Payer: Medicaid Other

## 2019-01-04 ENCOUNTER — Ambulatory Visit (HOSPITAL_COMMUNITY): Payer: Self-pay

## 2019-01-05 ENCOUNTER — Other Ambulatory Visit: Payer: Self-pay

## 2019-01-05 ENCOUNTER — Ambulatory Visit (HOSPITAL_COMMUNITY)
Admission: RE | Admit: 2019-01-05 | Discharge: 2019-01-05 | Disposition: A | Discharge status: Home / Self Care | Payer: Medicaid Other | Source: Ambulatory Visit | Attending: Maternal & Fetal Medicine | Admitting: Maternal & Fetal Medicine

## 2019-01-05 ENCOUNTER — Ambulatory Visit (HOSPITAL_COMMUNITY): Payer: Medicaid Other | Admitting: *Deleted

## 2019-01-05 ENCOUNTER — Other Ambulatory Visit: Payer: Medicaid Other

## 2019-01-05 ENCOUNTER — Encounter (HOSPITAL_COMMUNITY): Payer: Self-pay

## 2019-01-05 ENCOUNTER — Ambulatory Visit (INDEPENDENT_AMBULATORY_CARE_PROVIDER_SITE_OTHER): Payer: Medicaid Other | Admitting: Obstetrics & Gynecology

## 2019-01-05 ENCOUNTER — Other Ambulatory Visit (HOSPITAL_COMMUNITY): Payer: Self-pay | Admitting: *Deleted

## 2019-01-05 VITALS — BP 110/66 | HR 78 | Temp 98.1°F

## 2019-01-05 VITALS — BP 100/69 | HR 82 | Temp 97.8°F | Wt 207.0 lb

## 2019-01-05 DIAGNOSIS — B182 Chronic viral hepatitis C: Secondary | ICD-10-CM

## 2019-01-05 DIAGNOSIS — O98412 Viral hepatitis complicating pregnancy, second trimester: Secondary | ICD-10-CM

## 2019-01-05 DIAGNOSIS — F1120 Opioid dependence, uncomplicated: Secondary | ICD-10-CM

## 2019-01-05 DIAGNOSIS — O99212 Obesity complicating pregnancy, second trimester: Secondary | ICD-10-CM

## 2019-01-05 DIAGNOSIS — O0990 Supervision of high risk pregnancy, unspecified, unspecified trimester: Secondary | ICD-10-CM | POA: Diagnosis present

## 2019-01-05 DIAGNOSIS — O99810 Abnormal glucose complicating pregnancy: Secondary | ICD-10-CM

## 2019-01-05 DIAGNOSIS — Z362 Encounter for other antenatal screening follow-up: Secondary | ICD-10-CM | POA: Diagnosis not present

## 2019-01-05 DIAGNOSIS — Z3A27 27 weeks gestation of pregnancy: Secondary | ICD-10-CM

## 2019-01-05 DIAGNOSIS — O09523 Supervision of elderly multigravida, third trimester: Secondary | ICD-10-CM | POA: Diagnosis not present

## 2019-01-05 DIAGNOSIS — O09292 Supervision of pregnancy with other poor reproductive or obstetric history, second trimester: Secondary | ICD-10-CM

## 2019-01-05 DIAGNOSIS — O99332 Smoking (tobacco) complicating pregnancy, second trimester: Secondary | ICD-10-CM

## 2019-01-05 DIAGNOSIS — O34219 Maternal care for unspecified type scar from previous cesarean delivery: Secondary | ICD-10-CM

## 2019-01-05 DIAGNOSIS — F1910 Other psychoactive substance abuse, uncomplicated: Secondary | ICD-10-CM

## 2019-01-05 DIAGNOSIS — O99322 Drug use complicating pregnancy, second trimester: Secondary | ICD-10-CM | POA: Diagnosis not present

## 2019-01-05 DIAGNOSIS — O09522 Supervision of elderly multigravida, second trimester: Secondary | ICD-10-CM

## 2019-01-05 NOTE — Progress Notes (Signed)
   PRENATAL VISIT NOTE  Subjective:  DEEPTI Salazar is a 38 y.o. [phone] at [redacted]w[redacted]d being seen today for ongoing prenatal care.  She is currently monitored for the following issues for this high-risk pregnancy and has Substance use disorder; Previous cesarean section complicating pregnancy, antepartum condition or complication; Endocarditis of tricuspid valve; Hepatitis C antibody positive in blood; Tobacco use disorder; Supervision of high risk pregnancy, antepartum; History of tricuspid valve replacement with bioprosthetic valve; AMA (advanced maternal age) multigravida 35+, unspecified trimester; Pregnancy complicated by subutex maintenance, antepartum (HCC); Depression; and Hepatitis C, chronic, maternal, antepartum (HCC) on their problem list.  Patient reports no complaints.  Contractions: Not present. Vag. Bleeding: None.  Movement: Present. Denies leaking of fluid.   The following portions of the patient's history were reviewed and updated as appropriate: allergies, current medications, past family history, past medical history, past social history, past surgical history and problem list.   Objective:   Vitals:   01/05/19 1039  BP: 100/69  Pulse: 82  Temp: 97.8 F (36.6 C)  Weight: 207 lb (93.9 kg)    Fetal Status: Fetal Heart Rate (bpm): 150   Movement: Present     General:  Alert, oriented and cooperative. Patient is in no acute distress.  Skin: Skin is warm and dry. No rash noted.   Cardiovascular: Normal heart rate noted  Respiratory: Normal respiratory effort, no problems with respiration noted  Abdomen: Soft, gravid, appropriate for gestational age.  Pain/Pressure: Absent     Pelvic: Cervical exam deferred        Extremities: Normal range of motion.  Edema: None  Mental Status: Normal mood and affect. Normal behavior. Normal judgment and thought content.   Assessment and Plan:  Pregnancy: [id number] at [redacted]w[redacted]d 1. Pregnancy complicated by subutex maintenance, antepartum (HCC)  Continue Subutex  2. Hepatitis C, chronic, maternal, antepartum (HCC) Unable to draw surveillance labs today, draw at next visit  3. Previous cesarean section complicating pregnancy, antepartum condition or complication Will be scheduled for 4th cesarean section at 39 weeks.   4. Supervision of high risk pregnancy, antepartum Unable to do 2 hr GTT today, had issues with lab draws. 1 hr GTT and some labs drawn. Declined Tdap today, will consider next visit. Unable to draw Hep C labs.  Preterm labor symptoms and general obstetric precautions including but not limited to vaginal bleeding, contractions, leaking of fluid and fetal movement were reviewed in detail with the patient. Please refer to After Visit Summary for other counseling recommendations.   Return in about 3 weeks (around 01/26/2019) for OFFICE HOB Visit, Tdap, Hepatitis labs .  Future Appointments  Date Time Provider Department Center  01/27/2019  8:15 AM Adam Phenix, MD [facility]  02/09/2019  8:00 AM [facility] NURSE [facility]  02/09/2019  8:00 AM [facility] [place] 3 [facility]    Jaynie Collins, MD

## 2019-01-05 NOTE — Progress Notes (Signed)
Spoke with pt while in the office for her OB appt. Pt sleepy and falling asleep while LC talking with her.   Pt reports she is wants to breast feed her infant but is concerned because she has Hepatitis C and is on Suboxone. Pt reports she also wants to be treated for Hep C and cannot get treatment while pregnant. Discussed we can look up the meds she needs to determine if compatible with BF.   Discussed with Pt it is ok to BF with Hepatitis C as long as there is no bleeding to the breast or nipples.   Noralee Stain, RN, IBCLC  Discussed that with being on Suboxone and breast feeding can be helpful to the infant to lessen withdrawal symptoms.   Enc pt to discuss with Provider and LC's while in the hospital. Pt informed that IP/OP Lactation assistance is available as needed.   Pt reports she has no further questions/concerns as needed.

## 2019-01-06 ENCOUNTER — Telehealth: Payer: Self-pay | Admitting: *Deleted

## 2019-01-06 DIAGNOSIS — O99810 Abnormal glucose complicating pregnancy: Secondary | ICD-10-CM | POA: Insufficient documentation

## 2019-01-06 LAB — CBC
Hematocrit: 37.4 % (ref 34.0–46.6)
Hemoglobin: 13 g/dL (ref 11.1–15.9)
MCH: 33.4 pg — ABNORMAL HIGH (ref 26.6–33.0)
MCHC: 34.8 g/dL (ref 31.5–35.7)
MCV: 96 fL (ref 79–97)
Platelets: 155 10*3/uL (ref 150–450)
RBC: 3.89 x10E6/uL (ref 3.77–5.28)
RDW: 12.2 % (ref 11.7–15.4)
WBC: 13.6 10*3/uL — ABNORMAL HIGH (ref 3.4–10.8)

## 2019-01-06 LAB — RPR: RPR Ser Ql: NONREACTIVE

## 2019-01-06 LAB — GLUCOSE TOLERANCE, 1 HOUR: Glucose, 1Hr PP: 152 mg/dL (ref 65–199)

## 2019-01-06 LAB — HIV ANTIBODY (ROUTINE TESTING W REFLEX): HIV Screen 4th Generation wRfx: NONREACTIVE

## 2019-01-06 NOTE — Progress Notes (Signed)
1 hr GTT 152.  Please schedule lab appointment ASAP for 2 hr GTT, please also check HCV RNA quant at the same time (unable to be checked on 01/05/19). Orders were placed as future orders in the system.  Thank you!

## 2019-01-06 NOTE — Telephone Encounter (Signed)
Attempted to contact pt regarding elevated 1hr glucose and need for a 2hr asap per Dr. Mont Dutton note below:    "1 hr GTT 152. Please schedule lab appointment ASAP for 2 hr GTT, please also check HCV RNA quant at the same time (unable to be checked on 01/05/19). Orders were placed as future orders in the system. Thank you! "  Pt did not pick up.  Left voicemail advising the pt that she was being contacted regarding results and requesting she contact the clinic.

## 2019-01-07 NOTE — Telephone Encounter (Signed)
I called Tiffany Salazar and notified her of elevated 1 hr gtt and need for 2 hour gtt and answered her questions. She agreed to fasting 2 hr gtt on 01/13/19.

## 2019-01-27 ENCOUNTER — Encounter: Payer: Medicaid Other | Admitting: Obstetrics & Gynecology

## 2019-02-01 ENCOUNTER — Telehealth: Payer: Self-pay | Admitting: Family Medicine

## 2019-02-01 NOTE — Telephone Encounter (Signed)
Spoke with patient about coming in to get a fasting lab done, and to not eat or drink anything after 12 midnight. She was sent a link to get signed up for MyChart, and to call us back if she needed help.

## 2019-02-03 ENCOUNTER — Other Ambulatory Visit: Payer: Medicaid Other

## 2019-02-03 ENCOUNTER — Encounter: Payer: Medicaid Other | Admitting: Obstetrics & Gynecology

## 2019-02-03 ENCOUNTER — Telehealth: Payer: Self-pay | Admitting: Obstetrics and Gynecology

## 2019-02-03 NOTE — Telephone Encounter (Signed)
The patient called in to reschedule her missed appointment. Informed the patient of the next available. She prefers to have all appointments on the same day. Also stated she will come over to our clinic after checking in with ultrasound to begin the two hour. Informed the patient she can.

## 2019-02-09 ENCOUNTER — Ambulatory Visit (INDEPENDENT_AMBULATORY_CARE_PROVIDER_SITE_OTHER): Payer: Medicaid Other | Admitting: Obstetrics and Gynecology

## 2019-02-09 ENCOUNTER — Encounter: Payer: Self-pay | Admitting: Obstetrics and Gynecology

## 2019-02-09 ENCOUNTER — Other Ambulatory Visit: Payer: Medicaid Other

## 2019-02-09 ENCOUNTER — Ambulatory Visit (HOSPITAL_COMMUNITY)
Admission: RE | Admit: 2019-02-09 | Discharge: 2019-02-09 | Disposition: A | Discharge status: Home / Self Care | Payer: Medicaid Other | Source: Ambulatory Visit | Attending: Obstetrics and Gynecology | Admitting: Obstetrics and Gynecology

## 2019-02-09 ENCOUNTER — Other Ambulatory Visit (HOSPITAL_COMMUNITY): Payer: Self-pay | Admitting: *Deleted

## 2019-02-09 ENCOUNTER — Ambulatory Visit (HOSPITAL_COMMUNITY): Payer: Medicaid Other | Admitting: *Deleted

## 2019-02-09 ENCOUNTER — Encounter (HOSPITAL_COMMUNITY): Payer: Self-pay | Admitting: *Deleted

## 2019-02-09 ENCOUNTER — Other Ambulatory Visit: Payer: Self-pay

## 2019-02-09 VITALS — Wt 217.0 lb

## 2019-02-09 VITALS — BP 108/67 | HR 68 | Temp 97.0°F

## 2019-02-09 DIAGNOSIS — O99323 Drug use complicating pregnancy, third trimester: Secondary | ICD-10-CM | POA: Diagnosis not present

## 2019-02-09 DIAGNOSIS — O98413 Viral hepatitis complicating pregnancy, third trimester: Secondary | ICD-10-CM

## 2019-02-09 DIAGNOSIS — O09523 Supervision of elderly multigravida, third trimester: Secondary | ICD-10-CM

## 2019-02-09 DIAGNOSIS — O99333 Smoking (tobacco) complicating pregnancy, third trimester: Secondary | ICD-10-CM

## 2019-02-09 DIAGNOSIS — B182 Chronic viral hepatitis C: Secondary | ICD-10-CM

## 2019-02-09 DIAGNOSIS — F1120 Opioid dependence, uncomplicated: Secondary | ICD-10-CM

## 2019-02-09 DIAGNOSIS — O09529 Supervision of elderly multigravida, unspecified trimester: Secondary | ICD-10-CM

## 2019-02-09 DIAGNOSIS — Z3009 Encounter for other general counseling and advice on contraception: Secondary | ICD-10-CM

## 2019-02-09 DIAGNOSIS — Z23 Encounter for immunization: Secondary | ICD-10-CM

## 2019-02-09 DIAGNOSIS — O99810 Abnormal glucose complicating pregnancy: Secondary | ICD-10-CM

## 2019-02-09 DIAGNOSIS — O09293 Supervision of pregnancy with other poor reproductive or obstetric history, third trimester: Secondary | ICD-10-CM

## 2019-02-09 DIAGNOSIS — Z953 Presence of xenogenic heart valve: Secondary | ICD-10-CM | POA: Diagnosis not present

## 2019-02-09 DIAGNOSIS — O99320 Drug use complicating pregnancy, unspecified trimester: Secondary | ICD-10-CM

## 2019-02-09 DIAGNOSIS — R768 Other specified abnormal immunological findings in serum: Secondary | ICD-10-CM | POA: Diagnosis not present

## 2019-02-09 DIAGNOSIS — Z3A32 32 weeks gestation of pregnancy: Secondary | ICD-10-CM

## 2019-02-09 DIAGNOSIS — O0993 Supervision of high risk pregnancy, unspecified, third trimester: Secondary | ICD-10-CM | POA: Diagnosis present

## 2019-02-09 DIAGNOSIS — O34219 Maternal care for unspecified type scar from previous cesarean delivery: Secondary | ICD-10-CM

## 2019-02-09 DIAGNOSIS — O0990 Supervision of high risk pregnancy, unspecified, unspecified trimester: Secondary | ICD-10-CM

## 2019-02-09 DIAGNOSIS — Z362 Encounter for other antenatal screening follow-up: Secondary | ICD-10-CM | POA: Diagnosis not present

## 2019-02-09 DIAGNOSIS — F1910 Other psychoactive substance abuse, uncomplicated: Secondary | ICD-10-CM

## 2019-02-09 DIAGNOSIS — O99213 Obesity complicating pregnancy, third trimester: Secondary | ICD-10-CM

## 2019-02-09 HISTORY — DX: Encounter for other general counseling and advice on contraception: Z30.09

## 2019-02-09 NOTE — Patient Instructions (Signed)
Places to have your son circumcised:                                                                      [facility]     [phone]   $480 while you are in hospital         [facility]              [phone]   $269 by 4 wks                      [facility]                     [phone]   $269 by 7 days [facility]                    [phone]   $269 by 4 wks [facility]             [phone]   $175 by 2 wks    These prices sometimes change but are roughly what you can expect to pay. Please call and confirm pricing.   Circumcision is considered an elective/non-medically necessary procedure. There are many reasons parents decide to have their sons circumsized. During the first year of life circumcised males have a reduced risk of urinary tract infections but after this year the rates between circumcised males and uncircumcised males are the same.  It is safe to have your son circumcised outside of the hospital and the places above perform them regularly.   Deciding about Circumcision in Baby Boys  (Up-to-date The Basics)  What is circumcision?   Circumcision is a surgery that removes the skin that covers the tip of the penis, called the "foreskin" Circumcision is usually done when a boy is between 1 and 10 days old. In the [place], circumcision is common. In some other countries, fewer boys are circumcised. Circumcision is a common tradition in some religions.  Should I have my baby boy circumcised?   There is no easy answer. Circumcision has some benefits. But it also has risks. After talking with your doctor, you will have to decide for yourself what is right for your family.  What are the benefits of circumcision?   Circumcised boys seem to have slightly lower rates of: ?Urinary tract infections ?Swelling of the opening at the tip of the penis Circumcised men seem to have slightly lower rates of: ?Urinary tract infections ?Swelling of the opening at the tip of the penis ?Penis cancer ?HIV  and other infections that you catch during sex ?Cervical cancer in the women they have sex with Even so, in the [place], the risks of these problems are small - even in boys and men who have not been circumcised. Plus, boys and men who are not circumcised can reduce these extra risks by: ?Cleaning their penis well ?Using condoms during sex  What are the risks of circumcision?  Risks include: ?Bleeding or infection from the surgery ?Damage to or amputation of the penis ?A chance that the doctor will cut off too much or not enough of the foreskin ?A chance that sex won't feel as good later in life Only about 1 out of every 200   circumcisions leads to problems. There is also a chance that your health insurance won't pay for circumcision.  How is circumcision done in baby boys?  First, the baby gets medicine for pain relief. This might be a cream on the skin or a shot into the base of the penis. Next, the doctor cleans the baby's penis well. Then he or she uses special tools to cut off the foreskin. Finally, the doctor wraps a bandage (called gauze) around the baby's penis. If you have your baby circumcised, his doctor or nurse will give you instructions on how to care for him after the surgery. It is important that you follow those instructions carefully.  

## 2019-02-09 NOTE — Progress Notes (Signed)
   PRENATAL VISIT NOTE  Subjective:  Tiffany Salazar is a 38 y.o. [phone] at [redacted]w[redacted]d being seen today for ongoing prenatal care.  She is currently monitored for the following issues for this high-risk pregnancy and has Substance use disorder; Previous cesarean section complicating pregnancy, antepartum condition or complication; Endocarditis of tricuspid valve; Hepatitis C antibody positive in blood; Tobacco use disorder; Supervision of high risk pregnancy, antepartum; History of tricuspid valve replacement with bioprosthetic valve; AMA (advanced maternal age) multigravida 35+, unspecified trimester; Pregnancy complicated by subutex maintenance, antepartum ([place]); Depression; Hepatitis C, chronic, maternal, antepartum ([place]); Abnormal glucose tolerance test (GTT) during pregnancy, antepartum; and Unwanted fertility on their problem list.  Patient reports occasional pelvic pain.  Contractions: Not present. Vag. Bleeding: None.  Movement: Present. Denies leaking of fluid.   The following portions of the patient's history were reviewed and updated as appropriate: allergies, current medications, past family history, past medical history, past social history, past surgical history and problem list.   Objective:   BP108/67 Pulse 68 Vitals:   02/09/19 0930  Weight: 217 lb (98.4 kg)    Fetal Status: Fetal Heart Rate (bpm): 153   Movement: Present     General:  Alert, oriented and cooperative. Patient is in no acute distress.  Skin: Skin is warm and dry. No rash noted.   Cardiovascular: Normal heart rate noted  Respiratory: Normal respiratory effort, no problems with respiration noted  Abdomen: Soft, gravid, appropriate for gestational age.  Pain/Pressure: Present     Pelvic: Cervical exam deferred        Extremities: Normal range of motion.  Edema: None  Mental Status: Normal mood and affect. Normal behavior. Normal judgment and thought content.   Assessment and Plan:  Pregnancy: [id number] at [redacted]w[redacted]d   1. Pregnancy complicated by subutex maintenance, antepartum ([place]) - Tdap vaccine greater than or equal to 7yo IM  2. Supervision of high risk pregnancy, antepartum  3. Hepatitis C antibody positive in blood  4. History of tricuspid valve replacement with bioprosthetic valve No ppx indicated  5. AMA (advanced maternal age) multigravida 40+, unspecified trimester  6. Previous cesarean section complicating pregnancy, antepartum condition or complication RCS requested today  7. Unwanted fertility For BTL, resigned Medicaid papers today   Preterm labor symptoms and general obstetric precautions including but not limited to vaginal bleeding, contractions, leaking of fluid and fetal movement were reviewed in detail with the patient. Please refer to After Visit Summary for other counseling recommendations.   Return in about 2 weeks (around 02/23/2019) for OB visit (MD), virtual.  Future Appointments  Date Time Provider [place]  02/23/2019  2:35 PM Chancy Milroy, MD [place]  03/09/2019  9:00 AM [place] [facility]-US  03/09/2019  9:00 AM WH-MFC [place] 3 WH-MFCUS MFC-US    Sloan Leiter, MD

## 2019-02-13 LAB — HCV RNA QUANT RFLX ULTRA OR GENOTYP
HCV Quant Baseline: 201000 IU/mL
HCV log10: 5.303 log10 IU/mL

## 2019-02-13 LAB — GLUCOSE TOLERANCE, 2 HOURS W/ 1HR
Glucose, 1 hour: 148 mg/dL (ref 65–179)
Glucose, 2 hour: 82 mg/dL (ref 65–152)
Glucose, Fasting: 78 mg/dL (ref 65–91)

## 2019-02-13 LAB — HEPATITIS C GENOTYPE

## 2019-02-22 ENCOUNTER — Telehealth: Payer: Self-pay | Admitting: Advanced Practice Midwife

## 2019-02-22 NOTE — Telephone Encounter (Signed)
Left a voicemail message instructing the patient of how enter the virtual visit via mychart. Informed the patient is she has any questions please call our office at [phone]. °

## 2019-02-23 ENCOUNTER — Other Ambulatory Visit: Payer: Self-pay

## 2019-02-23 ENCOUNTER — Telehealth (INDEPENDENT_AMBULATORY_CARE_PROVIDER_SITE_OTHER): Payer: Medicaid Other | Admitting: Obstetrics and Gynecology

## 2019-02-23 ENCOUNTER — Encounter: Payer: Self-pay | Admitting: Obstetrics and Gynecology

## 2019-02-23 VITALS — BP 94/62 | HR 74

## 2019-02-23 DIAGNOSIS — O09529 Supervision of elderly multigravida, unspecified trimester: Secondary | ICD-10-CM

## 2019-02-23 DIAGNOSIS — O09523 Supervision of elderly multigravida, third trimester: Secondary | ICD-10-CM

## 2019-02-23 DIAGNOSIS — Z3009 Encounter for other general counseling and advice on contraception: Secondary | ICD-10-CM

## 2019-02-23 DIAGNOSIS — F1120 Opioid dependence, uncomplicated: Secondary | ICD-10-CM

## 2019-02-23 DIAGNOSIS — B182 Chronic viral hepatitis C: Secondary | ICD-10-CM

## 2019-02-23 DIAGNOSIS — O99320 Drug use complicating pregnancy, unspecified trimester: Secondary | ICD-10-CM

## 2019-02-23 DIAGNOSIS — O99323 Drug use complicating pregnancy, third trimester: Secondary | ICD-10-CM

## 2019-02-23 DIAGNOSIS — O0993 Supervision of high risk pregnancy, unspecified, third trimester: Secondary | ICD-10-CM

## 2019-02-23 DIAGNOSIS — O0990 Supervision of high risk pregnancy, unspecified, unspecified trimester: Secondary | ICD-10-CM

## 2019-02-23 DIAGNOSIS — O98413 Viral hepatitis complicating pregnancy, third trimester: Secondary | ICD-10-CM

## 2019-02-23 DIAGNOSIS — Z953 Presence of xenogenic heart valve: Secondary | ICD-10-CM

## 2019-02-23 DIAGNOSIS — F1990 Other psychoactive substance use, unspecified, uncomplicated: Secondary | ICD-10-CM

## 2019-02-23 DIAGNOSIS — O34219 Maternal care for unspecified type scar from previous cesarean delivery: Secondary | ICD-10-CM

## 2019-02-23 DIAGNOSIS — Z3A34 34 weeks gestation of pregnancy: Secondary | ICD-10-CM

## 2019-02-23 NOTE — Progress Notes (Signed)
TELEHEALTH OBSTETRICS PRENATAL VIRTUAL VIDEO VISIT ENCOUNTER NOTE  [facility]   I connected with Tiffany Shepherdracy N Murton on 02/23/19 at  2:35 PM EDT My Chart Video Encounter at home and verified that I am speaking with the correct person using two identifiers.   I discussed the limitations, risks, security and privacy concerns of performing an evaluation and management service by telephone and the availability of in person appointments. I also discussed with the patient that there may be a patient responsible charge related to this service. The patient expressed understanding and agreed to proceed. Subjective:  Tiffany Salazar is a 38 y.o. [phone] at 2341w1d being seen today for ongoing prenatal care.  She is currently monitored for the following issues for this high-risk pregnancy and has Substance use disorder; Previous cesarean section complicating pregnancy, antepartum condition or complication; Endocarditis of tricuspid valve; Hepatitis C antibody positive in blood; Tobacco use disorder; Supervision of high risk pregnancy, antepartum; History of tricuspid valve replacement with bioprosthetic valve; AMA (advanced maternal age) multigravida 35+, unspecified trimester; Pregnancy complicated by subutex maintenance, antepartum (HCC); Depression; Hepatitis C, chronic, maternal, antepartum (HCC); and Unwanted fertility on their problem list.  Patient reports no complaints.  Contractions: Not present. Vag. Bleeding: None.  Movement: Present. Denies any leaking of fluid.   The following portions of the patient's history were reviewed and updated as appropriate: allergies, current medications, past family history, past medical history, past social history, past surgical history and problem list.   Objective:   Vitals:   02/23/19 1448  BP: 94/62  Pulse: 74    Fetal Status:     Movement: Present     General:  Alert, oriented and cooperative. Patient is in  no acute distress.  Respiratory: Normal respiratory effort, no problems with respiration noted  Mental Status: Normal mood and affect. Normal behavior. Normal judgment and thought content.  Rest of physical exam deferred due to type of encounter  Imaging: [place] Mfm Ob Follow Up  Result Date: 02/09/2019 ----------------------------------------------------------------------  OBSTETRICS REPORT                       (Signed Final 02/09/2019 12:13 pm) ---------------------------------------------------------------------- Patient Info  ID #:       [id number]                          D.O.B.:  [birth year] (38 yrs)  Name:       Tiffany ShepherdRACY N Decandia                  Visit Date: 02/09/2019 08:27 am ---------------------------------------------------------------------- Performed By  Performed By:     Eden Lathearrie Stalter BS      Ref. Address:      [address]                    [address]                                                              [address]  [place]                                                              [phone]  Attending:        Noralee Spaceavi Shankar MD        [facility]  Referred By:      Levie HeritageJACOB J STINSON                    MD ---------------------------------------------------------------------- Orders   #  Description                          Code         Ordered By   1  US MFM OB FOLLOW UP                  [id number]     Lin LandsmanORENTHIAN                                                        BOOKER  ----------------------------------------------------------------------   #  Order #                    Accession #                 Episode #   1  [id number]                  [id number]                  [id number]  ---------------------------------------------------------------------- Indications   Encounter for other antenatal screening        Z36.2   follow-up   Advanced  maternal age multigravida 7235+,        29O09.523   third trimester (low risk NIPS, neg AFP)   Previous cesarean delivery, antepartum (x3)    O34.219   Pregnancy comlicated by subutex                O99.320, F11.20,   maintenance, antepartum                        Z79.891   Substance abuse affecting pregnancy,           O99.320 F19.10   antepartum   Poor obstetrical history (Hx Tricuspid valve   O09.299   replacement)   Obesity complicating pregnancy, third          O99.213   trimester (pregravid BMI 31)   Tobacco use complicating pregnancy, third      O99.333   trimester   Chronic Hepatitis C complicating pregnancy,    O98.419  B18.2   antepartum   [redacted] weeks gestation of pregnancy                Z3A.32  ---------------------------------------------------------------------- Vital Signs                                                 Height:        5'7" ---------------------------------------------------------------------- Fetal Evaluation  Num Of Fetuses:          1  Fetal Heart Rate(bpm):   135  Cardiac Activity:        Observed  Presentation:            Cephalic  Placenta:                Anterior  P. Cord Insertion:       Visualized  Amniotic Fluid  AFI FV:      Within normal limits  AFI Sum(cm)     %Tile       Largest Pocket(cm)  20.79           79          6.74  RUQ(cm)       RLQ(cm)       LUQ(cm)        LLQ(cm)  6.74          4.9           5.93           3.22 ---------------------------------------------------------------------- Biometry  BPD:      85.4  mm     G. Age:  34w 3d         94  %    CI:        72.31   %    70 - 86                                                          FL/HC:       17.9  %    19.1 - 21.3  HC:      319.5  mm     G. Age:  36w 0d         96  %    HC/AC:       1.10       0.96 - 1.17  AC:      291.2  mm     G. Age:  33w 1d         76  %    FL/BPD:      66.9  %    71 - 87  FL:       57.1  mm     G. Age:  30w 0d        < 3  %    FL/AC:       19.6  %    20 - 24  HUM:      53.3  mm     G. Age:   31w 0d         29  %  Est. FW:    2005   gm     4 lb 7 oz  65  % ---------------------------------------------------------------------- OB History  Gravidity:    7         Term:   3        Prem:   0        SAB:   1  TOP:          2       Ectopic:  0        Living: 3 ---------------------------------------------------------------------- Gestational Age  LMP:           32w 1d        Date:  06/29/18                 EDD:   04/05/19  U/S Today:     33w 3d                                        EDD:   03/27/19  Best:          32w 1d     Det. By:  LMP  (06/29/18)          EDD:   04/05/19 ---------------------------------------------------------------------- Anatomy  Cranium:               Appears normal         Aortic Arch:            Previously seen  Cavum:                 Appears normal         Ductal Arch:            Previously seen  Ventricles:            Appears normal         Diaphragm:              Appears normal  Choroid Plexus:        Previously seen        Stomach:                Appears normal, left                                                                        sided  Cerebellum:            Appears normal         Abdomen:                Appears normal  Posterior Fossa:       Previously seen        Abdominal Wall:         Previously seen  Nuchal Fold:           Previously seen        Cord Vessels:           Previously seen  Face:                  Appears normal         Kidneys:                Appear normal                         (  orbits and profile)  Lips:                  Appears normal         Bladder:                Appears normal  Thoracic:              Appears normal         Spine:                  Previously seen  Heart:                 Appears normal         Upper Extremities:      Previously seen                         (4CH, axis, and                         situs)  RVOT:                  Appears normal         Lower Extremities:      Previously seen  LVOT:                  Appears normal   Other:  Technically difficult due to fetal position. ---------------------------------------------------------------------- Cervix Uterus Adnexa  Cervix  Not visualized (advanced GA >24wks)  Uterus  No abnormality visualized.  Left Ovary  Within normal limits.  Right Ovary  Not visualized.  Cul De Sac  No free fluid seen.  Adnexa  No abnormality visualized. ---------------------------------------------------------------------- Impression  Amniotic fluid is normal and good fetal activity is seen. Fetal  growth is appropriate for gestational age. Patient had  screening for gestational diabetes today.  BP 108/67 mm Hg. ---------------------------------------------------------------------- Recommendations  An appointment was made for her to return in 4 weeks for  fetal growth assessment. ----------------------------------------------------------------------                  Noralee Spaceavi Shankar, MD Electronically Signed Final Report   02/09/2019 12:13 pm ----------------------------------------------------------------------   Assessment and Plan:  Pregnancy: [id number] at 9565w1d 1. Supervision of high risk pregnancy, antepartum Stable GBS and vaginal cultures next visit  2. History of tricuspid valve replacement with bioprosthetic valve BASA qd  3. AMA (advanced maternal age) multigravida 35+, unspecified trimester   4. Pregnancy complicated by subutex maintenance, antepartum (HCC) Stable   5. Hepatitis C, chronic, maternal, antepartum (HCC)   6. Unwanted fertility BTL papers signed  7. Previous cesarean section complicating pregnancy, antepartum condition or complication For repeat and BTL  8. Substance use disorder See above  Preterm labor symptoms and general obstetric precautions including but not limited to vaginal bleeding, contractions, leaking of fluid and fetal movement were reviewed in detail with the patient. I discussed the assessment and treatment plan with the patient. The patient was  provided an opportunity to ask questions and all were answered. The patient agreed with the plan and demonstrated an understanding of the instructions. The patient was advised to call back or seek an in-person office evaluation/[facility] for any urgent or concerning symptoms. Please refer to After Visit Summary for other counseling recommendations.   I provided 8 minutes of face-to-face time during this encounter.  No follow-ups on file.  Future  Appointments  Date Time Provider [place]  03/09/2019  9:00 AM [place] MFC-US  03/09/2019  9:00 AM WH-MFC [place] 3 WH-MFCUS MFC-US  03/14/2019  2:15 PM Aletha Halim, MD [facility] WOC  03/21/2019  3:35 PM Emily Filbert, MD [place]  03/28/2019  3:55 PM Emily Filbert, MD WOC-WOCA [place]  04/04/2019  3:35 PM Woodroe Mode, MD [facility] [place]  04/06/2019  8:15 AM WOC-WOCA [facility] WOC-WOCA WOC  04/06/2019  9:15 AM Sloan Leiter, MD [facility]    Chancy Milroy, MD [facility]

## 2019-03-09 ENCOUNTER — Encounter (HOSPITAL_COMMUNITY): Payer: Self-pay

## 2019-03-09 ENCOUNTER — Ambulatory Visit (HOSPITAL_COMMUNITY): Payer: Medicaid Other

## 2019-03-14 ENCOUNTER — Encounter (HOSPITAL_COMMUNITY): Payer: Self-pay | Admitting: *Deleted

## 2019-03-14 ENCOUNTER — Ambulatory Visit (HOSPITAL_COMMUNITY)
Admission: RE | Admit: 2019-03-14 | Discharge: 2019-03-14 | Disposition: A | Discharge status: Home / Self Care | Payer: Medicaid Other | Source: Ambulatory Visit | Attending: Obstetrics and Gynecology | Admitting: Obstetrics and Gynecology

## 2019-03-14 ENCOUNTER — Other Ambulatory Visit: Payer: Self-pay

## 2019-03-14 ENCOUNTER — Ambulatory Visit (HOSPITAL_COMMUNITY): Payer: Medicaid Other | Admitting: *Deleted

## 2019-03-14 ENCOUNTER — Other Ambulatory Visit (HOSPITAL_COMMUNITY)
Admission: RE | Admit: 2019-03-14 | Discharge: 2019-03-14 | Disposition: A | Discharge status: Home / Self Care | Payer: Medicaid Other | Source: Ambulatory Visit | Attending: Obstetrics and Gynecology | Admitting: Obstetrics and Gynecology

## 2019-03-14 ENCOUNTER — Ambulatory Visit (INDEPENDENT_AMBULATORY_CARE_PROVIDER_SITE_OTHER): Payer: Medicaid Other | Admitting: Obstetrics and Gynecology

## 2019-03-14 VITALS — BP 115/65 | HR 75 | Temp 97.6°F

## 2019-03-14 VITALS — BP 109/70 | HR 80 | Temp 97.7°F | Wt 217.0 lb

## 2019-03-14 DIAGNOSIS — O99213 Obesity complicating pregnancy, third trimester: Secondary | ICD-10-CM

## 2019-03-14 DIAGNOSIS — O09523 Supervision of elderly multigravida, third trimester: Secondary | ICD-10-CM | POA: Diagnosis not present

## 2019-03-14 DIAGNOSIS — O09529 Supervision of elderly multigravida, unspecified trimester: Secondary | ICD-10-CM

## 2019-03-14 DIAGNOSIS — Z3A36 36 weeks gestation of pregnancy: Secondary | ICD-10-CM

## 2019-03-14 DIAGNOSIS — O0990 Supervision of high risk pregnancy, unspecified, unspecified trimester: Secondary | ICD-10-CM

## 2019-03-14 DIAGNOSIS — F1120 Opioid dependence, uncomplicated: Secondary | ICD-10-CM

## 2019-03-14 DIAGNOSIS — Z3A37 37 weeks gestation of pregnancy: Secondary | ICD-10-CM

## 2019-03-14 DIAGNOSIS — B182 Chronic viral hepatitis C: Secondary | ICD-10-CM

## 2019-03-14 DIAGNOSIS — Z362 Encounter for other antenatal screening follow-up: Secondary | ICD-10-CM | POA: Diagnosis not present

## 2019-03-14 DIAGNOSIS — O09293 Supervision of pregnancy with other poor reproductive or obstetric history, third trimester: Secondary | ICD-10-CM

## 2019-03-14 DIAGNOSIS — O34219 Maternal care for unspecified type scar from previous cesarean delivery: Secondary | ICD-10-CM

## 2019-03-14 DIAGNOSIS — O99333 Smoking (tobacco) complicating pregnancy, third trimester: Secondary | ICD-10-CM

## 2019-03-14 DIAGNOSIS — O98413 Viral hepatitis complicating pregnancy, third trimester: Secondary | ICD-10-CM

## 2019-03-14 DIAGNOSIS — O98419 Viral hepatitis complicating pregnancy, unspecified trimester: Secondary | ICD-10-CM

## 2019-03-14 DIAGNOSIS — O99320 Drug use complicating pregnancy, unspecified trimester: Secondary | ICD-10-CM

## 2019-03-14 DIAGNOSIS — Z3009 Encounter for other general counseling and advice on contraception: Secondary | ICD-10-CM

## 2019-03-14 DIAGNOSIS — Z953 Presence of xenogenic heart valve: Secondary | ICD-10-CM

## 2019-03-14 DIAGNOSIS — O0993 Supervision of high risk pregnancy, unspecified, third trimester: Secondary | ICD-10-CM

## 2019-03-14 DIAGNOSIS — O99323 Drug use complicating pregnancy, third trimester: Secondary | ICD-10-CM | POA: Diagnosis not present

## 2019-03-15 ENCOUNTER — Encounter (HOSPITAL_COMMUNITY): Payer: Self-pay

## 2019-03-15 LAB — PROTIME-INR
INR: 0.9 (ref 0.8–1.2)
Prothrombin Time: 10.1 s (ref 9.1–12.0)

## 2019-03-15 LAB — FIBRINOGEN: Fibrinogen: 394 mg/dL (ref 193–507)

## 2019-03-15 LAB — CMP AND LIVER
ALT: 24 IU/L (ref 0–32)
AST: 22 IU/L (ref 0–40)
Albumin: 3.2 g/dL — ABNORMAL LOW (ref 3.8–4.8)
Alkaline Phosphatase: 184 IU/L — ABNORMAL HIGH (ref 39–117)
BUN: 8 mg/dL (ref 6–20)
Bilirubin Total: 0.3 mg/dL (ref 0.0–1.2)
Bilirubin, Direct: 0.15 mg/dL (ref 0.00–0.40)
CO2: 18 mmol/L — ABNORMAL LOW (ref 20–29)
Calcium: 8.6 mg/dL — ABNORMAL LOW (ref 8.7–10.2)
Chloride: 104 mmol/L (ref 96–106)
Creatinine, Ser: 0.66 mg/dL (ref 0.57–1.00)
GFR calc Af Amer: 131 mL/min/{1.73_m2} (ref >59)
GFR calc non Af Amer: 114 mL/min/{1.73_m2} (ref >59)
Glucose: 94 mg/dL (ref 65–99)
Potassium: 4.2 mmol/L (ref 3.5–5.2)
Sodium: 138 mmol/L (ref 134–144)
Total Protein: 6.1 g/dL (ref 6.0–8.5)

## 2019-03-15 LAB — GC/CHLAMYDIA PROBE AMP (~~LOC~~) NOT AT ARMC
Chlamydia: NEGATIVE
Neisseria Gonorrhea: NEGATIVE

## 2019-03-15 LAB — APTT: aPTT: 23 s — ABNORMAL LOW (ref 24–33)

## 2019-03-15 NOTE — Progress Notes (Signed)
Prenatal Visit Note Date: 03/14/2019 Clinic[facility]  Subjective:  Tiffany Salazar is a 38 y.o. [id number] at [redacted]w[redacted]d being seen today for ongoing prenatal care.  She is currently monitored for the following issues for this high-risk pregnancy and has Substance use disorder; Previous cesarean section complicating pregnancy, antepartum condition or complication; Endocarditis of tricuspid valve; Hepatitis C antibody positive in blood; Tobacco use disorder; Supervision of high risk pregnancy, antepartum; History of tricuspid valve replacement with bioprosthetic valve; AMA (advanced maternal age) multigravida 35+, unspecified trimester; Pregnancy complicated by subutex maintenance, antepartum ([place]); Depression; Hepatitis C, chronic, maternal, antepartum ([place]); and Unwanted fertility on their problem list.  Patient reports no complaints.   Contractions: Not present. Vag. Bleeding: None.  Movement: Present. Denies leaking of fluid.   The following portions of the patient's history were reviewed and updated as appropriate: allergies, current medications, past family history, past medical history, past social history, past surgical history and problem list. Problem list updated.  Objective:   Vitals:   03/14/19 1430  BP: 109/70  Pulse: 80  Temp: 97.7 F (36.5 C)  Weight: 217 lb (98.4 kg)    Fetal Status: Fetal Heart Rate (bpm): 134   Movement: Present     General:  Alert, oriented and cooperative. Patient is in no acute distress.  Skin: Skin is warm and dry. No rash noted.   Cardiovascular: Normal heart rate noted  Respiratory: Normal respiratory effort, no problems with respiration noted  Abdomen: Soft, gravid, appropriate for gestational age. Pain/Pressure: Present     Pelvic:  Cervical exam deferred        Extremities: Normal range of motion.  Edema: None  Mental Status: Normal mood and affect. Normal behavior. Normal judgment and thought content.   Urinalysis:       Assessment and Plan:  Pregnancy: M1D6222 at [redacted]w[redacted]d  1. Supervision of high risk pregnancy, antepartum Routine care. Still desires btl (papers UTD) - Culture, beta strep (group b only) - GC/Chlamydia probe amp ()not at [facility] - CMP and Liver - Fibrinogen - APTT - Protime-INR  2. Hepatitis C, chronic, maternal, antepartum ([place]) Needs GI liver care postpartum care set up (request sent to [place] clinical pool to set this up for late sept/early October) - CMP and Liver - Fibrinogen - APTT - Protime-INR  3. Unwanted fertility See above.   4. Pregnancy complicated by subutex maintenance, antepartum (HCC) On 8 tid. Unsure of address/contact info of clinic or exact name. Pt to let us know when she comes in for delivery so we can confirm dose. D/w her re: NAS and newborn may have to stay. Pt was on methadone in the past and has experience with this  5. History of tricuspid valve replacement with bioprosthetic valve Per prior notes, no need for SBE ppx for vag or c-section  6. AMA (advanced maternal age) multigravida 24+, unspecified trimester No issues  7. Previous cesarean section complicating pregnancy, antepartum condition or complication Already scheduled  Preterm labor symptoms and general obstetric precautions including but not limited to vaginal bleeding, contractions, leaking of fluid and fetal movement were reviewed in detail with the patient. Please refer to After Visit Summary for other counseling recommendations.  Return in about 1 week (around 03/21/2019).   Aletha Halim, MD

## 2019-03-15 NOTE — Patient Instructions (Signed)
Tiffany Salazar  03/15/2019   Your procedure is scheduled on:  03/29/2019  Arrive at 55 at Entrance C on [address] at [facility]  and [address]. You are invited to use the FREE valet parking or use the Visitor's parking deck.  Pick up the phone at the desk and dial [phone].  Call this number if you have problems the morning of surgery: [phone]  Remember:   Do not eat food:(After Midnight) Desps de medianoche.  Do not drink clear liquids: (After Midnight) Desps de medianoche.  Take these medicines the morning of surgery with A SIP OF WATER:  subutex as prescribed   Do not wear jewelry, make-up or nail polish.  Do not wear lotions, powders, or perfumes. Do not wear deodorant.  Do not shave 48 hours prior to surgery.  Do not bring valuables to the hospital.  [facility] is not   responsible for any belongings or valuables brought to the hospital.  Contacts, dentures or bridgework may not be worn into surgery.  Leave suitcase in the car. After surgery it may be brought to your room.  For patients admitted to the hospital, checkout time is 11:00 AM the day of              discharge.      Please read over the following fact sheets that you were given:     Preparing for Surgery

## 2019-03-18 LAB — CULTURE, BETA STREP (GROUP B ONLY): Strep Gp B Culture: NEGATIVE

## 2019-03-21 ENCOUNTER — Other Ambulatory Visit: Payer: Self-pay

## 2019-03-21 ENCOUNTER — Telehealth (INDEPENDENT_AMBULATORY_CARE_PROVIDER_SITE_OTHER): Payer: Medicaid Other | Admitting: Obstetrics & Gynecology

## 2019-03-21 VITALS — BP 102/74 | HR 89

## 2019-03-21 DIAGNOSIS — Z3A37 37 weeks gestation of pregnancy: Secondary | ICD-10-CM

## 2019-03-21 DIAGNOSIS — B182 Chronic viral hepatitis C: Secondary | ICD-10-CM

## 2019-03-21 DIAGNOSIS — O0990 Supervision of high risk pregnancy, unspecified, unspecified trimester: Secondary | ICD-10-CM

## 2019-03-21 DIAGNOSIS — O0993 Supervision of high risk pregnancy, unspecified, third trimester: Secondary | ICD-10-CM

## 2019-03-21 DIAGNOSIS — Z3009 Encounter for other general counseling and advice on contraception: Secondary | ICD-10-CM

## 2019-03-21 DIAGNOSIS — O09529 Supervision of elderly multigravida, unspecified trimester: Secondary | ICD-10-CM

## 2019-03-21 DIAGNOSIS — O99213 Obesity complicating pregnancy, third trimester: Secondary | ICD-10-CM

## 2019-03-21 DIAGNOSIS — F1990 Other psychoactive substance use, unspecified, uncomplicated: Secondary | ICD-10-CM

## 2019-03-21 DIAGNOSIS — O99210 Obesity complicating pregnancy, unspecified trimester: Secondary | ICD-10-CM | POA: Insufficient documentation

## 2019-03-21 DIAGNOSIS — O98419 Viral hepatitis complicating pregnancy, unspecified trimester: Secondary | ICD-10-CM

## 2019-03-21 DIAGNOSIS — O34219 Maternal care for unspecified type scar from previous cesarean delivery: Secondary | ICD-10-CM

## 2019-03-21 DIAGNOSIS — O09523 Supervision of elderly multigravida, third trimester: Secondary | ICD-10-CM

## 2019-03-21 DIAGNOSIS — O98413 Viral hepatitis complicating pregnancy, third trimester: Secondary | ICD-10-CM

## 2019-03-21 NOTE — Progress Notes (Signed)
   PRENATAL VISIT NOTE  Subjective:  Tiffany Salazar is a 38 y.o. [phone] at [redacted]w[redacted]d being seen today for ongoing prenatal care.  She is currently monitored for the following issues for this high-risk pregnancy and has Substance use disorder; Previous cesarean section complicating pregnancy, antepartum condition or complication; Endocarditis of tricuspid valve; Hepatitis C antibody positive in blood; Tobacco use disorder; Supervision of high risk pregnancy, antepartum; History of tricuspid valve replacement with bioprosthetic valve; AMA (advanced maternal age) multigravida 35+, unspecified trimester; Pregnancy complicated by subutex maintenance, antepartum ([place]); Depression; Hepatitis C, chronic, maternal, antepartum ([place]); Unwanted fertility; and Obesity in pregnancy on their problem list.  Patient reports no complaints.  Contractions: Not present. Vag. Bleeding: None.  Movement: Present. Denies leaking of fluid.   The following portions of the patient's history were reviewed and updated as appropriate: allergies, current medications, past family history, past medical history, past social history, past surgical history and problem list.   Objective:   Vitals:   03/21/19 1551  BP: 102/74  Pulse: 89    Fetal Status:     Movement: Present     General:  Alert, oriented and cooperative. Patient is in no acute distress.  Skin: Skin is warm and dry. No rash noted.   Cardiovascular: Normal heart rate noted  Respiratory: Normal respiratory effort, no problems with respiration noted  Abdomen: Soft, gravid, appropriate for gestational age.  Pain/Pressure: Present     Pelvic: Cervical exam deferred        Extremities: Normal range of motion.  Edema: Trace  Mental Status: Normal mood and affect. Normal behavior. Normal judgment and thought content.   Assessment and Plan:  Pregnancy: [id number] at [redacted]w[redacted]d 1. Supervision of high risk pregnancy, antepartum   2. Hepatitis C, chronic, maternal, antepartum  ([place]) - follow up with GI  3. Unwanted fertility - pland BLT 4. AMA (advanced maternal age) multigravida 83+, unspecified trimester  5. Previous cesarean section complicating pregnancy, antepartum condition or complication - plans repeat with BTL  6. Substance use disorder - goes to [place] - takes 8 mg of subutex TID 7. Obesity in pregnancy   Preterm labor symptoms and general obstetric precautions including but not limited to vaginal bleeding, contractions, leaking of fluid and fetal movement were reviewed in detail with the patient. Please refer to After Visit Summary for other counseling recommendations.   No follow-ups on file.  Future Appointments  Date Time Provider [place]  03/25/2019  7:50 AM MC-MAU 1 MC-INDC None  03/28/2019  3:55 PM Emily Filbert, MD WOC-WOCA WOC  04/04/2019  3:35 PM Woodroe Mode, MD [place]  04/06/2019  8:15 AM WOC-WOCA NST WOC-WOCA WOC  04/06/2019  9:15 AM Sloan Leiter, MD WOC-WOCA WOC    Emily Filbert, MD

## 2019-03-25 ENCOUNTER — Other Ambulatory Visit: Payer: Self-pay

## 2019-03-25 ENCOUNTER — Other Ambulatory Visit (HOSPITAL_COMMUNITY)
Admission: RE | Admit: 2019-03-25 | Discharge: 2019-03-25 | Disposition: A | Discharge status: Home / Self Care | Payer: Medicaid Other | Source: Ambulatory Visit | Attending: Obstetrics and Gynecology | Admitting: Obstetrics and Gynecology

## 2019-03-25 DIAGNOSIS — Z01812 Encounter for preprocedural laboratory examination: Secondary | ICD-10-CM | POA: Insufficient documentation

## 2019-03-25 DIAGNOSIS — Z20828 Contact with and (suspected) exposure to other viral communicable diseases: Secondary | ICD-10-CM | POA: Insufficient documentation

## 2019-03-25 LAB — SARS CORONAVIRUS 2 (TAT 6-24 HRS): SARS Coronavirus 2: NEGATIVE

## 2019-03-25 NOTE — MAU Note (Signed)
Asymptomatic, swab collected. 

## 2019-03-28 ENCOUNTER — Telehealth (INDEPENDENT_AMBULATORY_CARE_PROVIDER_SITE_OTHER): Payer: Medicaid Other | Admitting: Obstetrics & Gynecology

## 2019-03-28 ENCOUNTER — Other Ambulatory Visit: Payer: Self-pay

## 2019-03-28 VITALS — BP 101/66 | HR 76

## 2019-03-28 DIAGNOSIS — F1990 Other psychoactive substance use, unspecified, uncomplicated: Secondary | ICD-10-CM

## 2019-03-28 DIAGNOSIS — O0993 Supervision of high risk pregnancy, unspecified, third trimester: Secondary | ICD-10-CM

## 2019-03-28 DIAGNOSIS — Z3A38 38 weeks gestation of pregnancy: Secondary | ICD-10-CM

## 2019-03-28 DIAGNOSIS — O99213 Obesity complicating pregnancy, third trimester: Secondary | ICD-10-CM

## 2019-03-28 DIAGNOSIS — O0990 Supervision of high risk pregnancy, unspecified, unspecified trimester: Secondary | ICD-10-CM

## 2019-03-28 DIAGNOSIS — Z3009 Encounter for other general counseling and advice on contraception: Secondary | ICD-10-CM

## 2019-03-28 DIAGNOSIS — Z953 Presence of xenogenic heart valve: Secondary | ICD-10-CM

## 2019-03-28 DIAGNOSIS — O34219 Maternal care for unspecified type scar from previous cesarean delivery: Secondary | ICD-10-CM

## 2019-03-28 DIAGNOSIS — O09523 Supervision of elderly multigravida, third trimester: Secondary | ICD-10-CM

## 2019-03-28 DIAGNOSIS — B182 Chronic viral hepatitis C: Secondary | ICD-10-CM

## 2019-03-28 DIAGNOSIS — O09529 Supervision of elderly multigravida, unspecified trimester: Secondary | ICD-10-CM

## 2019-03-28 DIAGNOSIS — O99210 Obesity complicating pregnancy, unspecified trimester: Secondary | ICD-10-CM

## 2019-03-28 DIAGNOSIS — O98413 Viral hepatitis complicating pregnancy, third trimester: Secondary | ICD-10-CM

## 2019-03-28 NOTE — Progress Notes (Signed)
TELEHEALTH OBSTETRICS PRENATAL VIRTUAL VIDEO VISIT ENCOUNTER NOTE  Provider [facility]: [facility]   I connected with Tiffany Salazar on 03/28/19 at  3:55 PM EDT by MyChart Video Encounter at home and verified that I am speaking with the correct person using two identifiers.   I discussed the limitations, risks, security and privacy concerns of performing an evaluation and management service virtually and the availability of in person appointments. I also discussed with the patient that there may be a patient responsible charge related to this service. The patient expressed understanding and agreed to proceed. Subjective:  Tiffany Salazar is a 38 y.o. [phone] at 2357w6d being seen today for ongoing prenatal care.  She is currently monitored for the following issues for this high-risk pregnancy and has Substance use disorder; Previous cesarean section complicating pregnancy, antepartum condition or complication; Endocarditis of tricuspid valve; Hepatitis C antibody positive in blood; Tobacco use disorder; Supervision of high risk pregnancy, antepartum; History of tricuspid valve replacement with bioprosthetic valve; AMA (advanced maternal age) multigravida 35+, unspecified trimester; Pregnancy complicated by subutex maintenance, antepartum (HCC); Depression; Hepatitis C, chronic, maternal, antepartum (HCC); Unwanted fertility; and Obesity in pregnancy on their problem list.  Patient reports no complaints.  Contractions: Not present. Vag. Bleeding: None.  Movement: Present. Denies any leaking of fluid.   The following portions of the patient's history were reviewed and updated as appropriate: allergies, current medications, past family history, past medical history, past social history, past surgical history and problem list.   Objective:   Vitals:   03/28/19 1601  BP: 101/66  Pulse: 76    Fetal Status:     Movement: Present     General:  Alert, oriented and  cooperative. Patient is in no acute distress.  Respiratory: Normal respiratory effort, no problems with respiration noted  Mental Status: Normal mood and affect. Normal behavior. Normal judgment and thought content.  Rest of physical exam deferred due to type of encounter  Imaging: [place] [facility] Ob Follow Up  Result Date: 03/15/2019 ----------------------------------------------------------------------  OBSTETRICS REPORT                       (Signed Final 03/15/2019 12:15 pm) ---------------------------------------------------------------------- Patient Info  ID #:       [id number]                          D.O.B.:  1981/05/21 (38 yrs)  Name:       Tiffany Salazar                  Visit Date: 03/14/2019 03:45 pm ---------------------------------------------------------------------- Performed By  Performed By:     Rennie PlowmanErica Lyskawa          Ref. Address:     [address]                    [place]                                                             [address]  [place]                                                             [phone]  Attending:        Lin Landsmanorenthian Booker      [facility]                    MD                                       Fetal [facility]  Referred By:      Levie HeritageJACOB J STINSON                    MD ---------------------------------------------------------------------- Orders   #  Description                          Code         Ordered By   1  US MFM OB FOLLOW UP                  [id number]     [facility]  ----------------------------------------------------------------------   #  Order #                    Accession #                 Episode #   1  [id number]                  [id number]                  [id number]  ---------------------------------------------------------------------- Indications   [redacted] weeks gestation of pregnancy                Z3A.36   Encounter for other antenatal screening        Z36.2   follow-up    Advanced maternal age multigravida 4235+,        58O09.523   third trimester (low risk NIPS, neg AFP)   Previous cesarean delivery, antepartum (x3)    O34.219   Pregnancy comlicated by subutex                O99.320, F11.20,   maintenance, antepartum                        Z79.891   Substance abuse affecting pregnancy,           O99.320 F19.10   antepartum   Poor obstetrical history (Hx Tricuspid valve   O09.299   replacement)   Obesity complicating pregnancy, third          O99.213   trimester (pregravid BMI 31)   Tobacco use complicating pregnancy, third      O99.333   trimester   Chronic Hepatitis C complicating pregnancy,    O98.419 B18.2   antepartum  ---------------------------------------------------------------------- Vital Signs  Weight (lb): 217  Height:        5'7"  BMI:         33.98 ---------------------------------------------------------------------- Fetal Evaluation  Num Of Fetuses:         1  Fetal Heart Rate(bpm):  129  Cardiac Activity:       Observed  Presentation:           Cephalic  Placenta:               Anterior  P. Cord Insertion:      Visualized, central  Amniotic Fluid  AFI FV:      Within normal limits  AFI Sum(cm)     %Tile       Largest Pocket(cm)  19.96           76          6.24  RUQ(cm)       RLQ(cm)       LUQ(cm)        LLQ(cm)  3.78          3.85          6.09           6.24 ---------------------------------------------------------------------- Biometry  BPD:      94.2  mm     G. Age:  38w 3d         93  %    CI:        80.33   %    70 - 86                                                          FL/HC:      21.2   %    20.8 - 22.6  HC:       332   mm     G. Age:  37w 6d         48  %    HC/AC:      1.02        0.92 - 1.05  AC:       327   mm     G. Age:  36w 4d         56  %    FL/BPD:     74.6   %    71 - 87  FL:       70.3  mm     G. Age:  36w 0d         27  %    FL/AC:      21.5   %    20 - 24  HUM:      60.2  mm     G. Age:  35w 0d         32  %  Est.  FW:    3036  gm    6 lb 11 oz      54  % ---------------------------------------------------------------------- OB History  Gravidity:    7         Term:   3        Prem:   0        SAB:   1  TOP:          2       Ectopic:  0  Living: 3 ---------------------------------------------------------------------- Gestational Age  LMP:           36w 6d        Date:  06/29/18                 EDD:   04/05/19  U/S Today:     37w 2d                                        EDD:   04/02/19  Best:          36w 6d     Det. By:  LMP  (06/29/18)          EDD:   04/05/19 ---------------------------------------------------------------------- Anatomy  Cranium:               Appears normal         LVOT:                   Previously seen  Cavum:                 Appears normal         Aortic Arch:            Previously seen  Ventricles:            Appears normal         Ductal Arch:            Previously seen  Choroid Plexus:        Appears normal         Diaphragm:              Appears normal  Cerebellum:            Appears normal         Stomach:                Appears normal, left                                                                        sided  Posterior Fossa:       Appears normal         Abdomen:                Appears normal  Nuchal Fold:           Not applicable (>20    Abdominal Wall:         Appears nml (cord                         wks GA)                                        insert, abd wall)  Face:                  Orbits previously      Cord Vessels:           Previously seen  seen, profile nl  Lips:                  Previously seen        Kidneys:                Appear normal  Palate:                Previously seen        Bladder:                Appears normal  Thoracic:              Appears normal         Spine:                  Previously seen  Heart:                 Previously seen        Upper Extremities:      Previously seen  RVOT:                  Previously seen        Lower  Extremities:      Previously seen  Other:  Heels and 5th digit previously seen. Nasal bone visualized. Female          gender previously seen. ---------------------------------------------------------------------- Cervix Uterus Adnexa  Cervix  Not visualized (advanced GA >24wks)  Left Ovary  Not visualized.  Right Ovary  Within normal limits.  Adnexa  No abnormality visualized. ---------------------------------------------------------------------- Impression  Normal interval growth. ---------------------------------------------------------------------- Recommendations  Follow up as clinically indicated. ----------------------------------------------------------------------               Sander Nephew, MD Electronically Signed Final Report   03/15/2019 12:15 pm ----------------------------------------------------------------------   Assessment and Plan:  Pregnancy: [id number] at [redacted]w[redacted]d 1. Obesity in pregnancy   2. Supervision of high risk pregnancy, antepartum   3. AMA (advanced maternal age) multigravida 26+, unspecified trimester   4. Hepatitis C, chronic, maternal, antepartum ([place])   5. Previous cesarean section complicating pregnancy, antepartum condition or complication - plans RLTCS  6. History of tricuspid valve replacement with bioprosthetic valve   7. Substance use disorder - subutex 8 mg TID  8. Unwanted fertility - plans BTL  Term labor symptoms and general obstetric precautions including but not limited to vaginal bleeding, contractions, leaking of fluid and fetal movement were reviewed in detail with the patient. I discussed the assessment and treatment plan with the patient. The patient was provided an opportunity to ask questions and all were answered. The patient agreed with the plan and demonstrated an understanding of the instructions. The patient was advised to call back or seek an in-person office evaluation/go to [facility]U at [facility] for any urgent or  concerning symptoms. Please refer to After Visit Summary for other counseling recommendations.   I provided 10 minutes of face-to-face time during this encounter.  No follow-ups on file.  No future appointments.  Emily Filbert, MD [facility] for [employer] [facility], [place]

## 2019-03-29 ENCOUNTER — Other Ambulatory Visit: Payer: Self-pay

## 2019-03-29 ENCOUNTER — Inpatient Hospital Stay (HOSPITAL_COMMUNITY)
Admission: RE | Admit: 2019-03-29 | Discharge: 2019-04-01 | DRG: 784 | Disposition: A | Discharge status: Home / Self Care | Payer: Medicaid Other | Attending: Obstetrics & Gynecology | Admitting: Obstetrics & Gynecology

## 2019-03-29 ENCOUNTER — Inpatient Hospital Stay (HOSPITAL_COMMUNITY): Payer: Medicaid Other | Admitting: Anesthesiology

## 2019-03-29 ENCOUNTER — Encounter (HOSPITAL_COMMUNITY)
Admission: RE | Disposition: A | Discharge status: Home / Self Care | Payer: Self-pay | Source: Home / Self Care | Attending: Obstetrics & Gynecology

## 2019-03-29 ENCOUNTER — Encounter (HOSPITAL_COMMUNITY): Payer: Self-pay | Admitting: *Deleted

## 2019-03-29 DIAGNOSIS — O34211 Maternal care for low transverse scar from previous cesarean delivery: Principal | ICD-10-CM | POA: Diagnosis present

## 2019-03-29 DIAGNOSIS — B182 Chronic viral hepatitis C: Secondary | ICD-10-CM | POA: Diagnosis present

## 2019-03-29 DIAGNOSIS — F17210 Nicotine dependence, cigarettes, uncomplicated: Secondary | ICD-10-CM | POA: Diagnosis present

## 2019-03-29 DIAGNOSIS — Z302 Encounter for sterilization: Secondary | ICD-10-CM | POA: Diagnosis not present

## 2019-03-29 DIAGNOSIS — O99320 Drug use complicating pregnancy, unspecified trimester: Secondary | ICD-10-CM

## 2019-03-29 DIAGNOSIS — F329 Major depressive disorder, single episode, unspecified: Secondary | ICD-10-CM | POA: Diagnosis present

## 2019-03-29 DIAGNOSIS — Z953 Presence of xenogenic heart valve: Secondary | ICD-10-CM

## 2019-03-29 DIAGNOSIS — Z3A39 39 weeks gestation of pregnancy: Secondary | ICD-10-CM | POA: Diagnosis not present

## 2019-03-29 DIAGNOSIS — F1990 Other psychoactive substance use, unspecified, uncomplicated: Secondary | ICD-10-CM | POA: Diagnosis present

## 2019-03-29 DIAGNOSIS — F32A Depression, unspecified: Secondary | ICD-10-CM | POA: Diagnosis present

## 2019-03-29 DIAGNOSIS — O99334 Smoking (tobacco) complicating childbirth: Secondary | ICD-10-CM | POA: Diagnosis present

## 2019-03-29 DIAGNOSIS — O98419 Viral hepatitis complicating pregnancy, unspecified trimester: Secondary | ICD-10-CM | POA: Diagnosis present

## 2019-03-29 DIAGNOSIS — R768 Other specified abnormal immunological findings in serum: Secondary | ICD-10-CM | POA: Diagnosis present

## 2019-03-29 DIAGNOSIS — O9842 Viral hepatitis complicating childbirth: Secondary | ICD-10-CM | POA: Diagnosis present

## 2019-03-29 DIAGNOSIS — Z8614 Personal history of Methicillin resistant Staphylococcus aureus infection: Secondary | ICD-10-CM | POA: Diagnosis not present

## 2019-03-29 DIAGNOSIS — O34219 Maternal care for unspecified type scar from previous cesarean delivery: Secondary | ICD-10-CM | POA: Diagnosis present

## 2019-03-29 DIAGNOSIS — F1120 Opioid dependence, uncomplicated: Secondary | ICD-10-CM

## 2019-03-29 DIAGNOSIS — O99324 Drug use complicating childbirth: Secondary | ICD-10-CM | POA: Diagnosis present

## 2019-03-29 DIAGNOSIS — O09529 Supervision of elderly multigravida, unspecified trimester: Secondary | ICD-10-CM

## 2019-03-29 LAB — CBC
HCT: 36.1 % (ref 36.0–46.0)
Hemoglobin: 11.5 g/dL — ABNORMAL LOW (ref 12.0–15.0)
MCH: 30.7 pg (ref 26.0–34.0)
MCHC: 31.9 g/dL (ref 30.0–36.0)
MCV: 96.5 fL (ref 80.0–100.0)
Platelets: 135 10*3/uL — ABNORMAL LOW (ref 150–400)
RBC: 3.74 MIL/uL — ABNORMAL LOW (ref 3.87–5.11)
RDW: 13 % (ref 11.5–15.5)
WBC: 9.7 10*3/uL (ref 4.0–10.5)
nRBC: 0 % (ref 0.0–0.2)

## 2019-03-29 LAB — TYPE AND SCREEN
ABO/RH(D): A POS
Antibody Screen: NEGATIVE

## 2019-03-29 SURGERY — Surgical Case
Anesthesia: Spinal | Laterality: Bilateral

## 2019-03-29 MED ORDER — WITCH HAZEL-GLYCERIN EX PADS: 1.0000 "application " | MEDICATED_PAD | CUTANEOUS | Status: DC | PRN

## 2019-03-29 MED ORDER — BUPRENORPHINE HCL 8 MG SL SUBL
8.0000 mg | SUBLINGUAL_TABLET | Freq: Three times a day (TID) | SUBLINGUAL | Status: DC
  Administered 2019-03-29 – 2019-04-01 (×8): 8 mg via SUBLINGUAL
  Filled 2019-03-29 (×8): qty 1

## 2019-03-29 MED ORDER — FENTANYL CITRATE (PF) 100 MCG/2ML IJ SOLN
INTRAMUSCULAR | Status: AC
  Filled 2019-03-29: qty 2

## 2019-03-29 MED ORDER — OXYTOCIN 10 UNIT/ML IJ SOLN
INTRAMUSCULAR | Status: DC | PRN
  Administered 2019-03-29: 40 [IU]

## 2019-03-29 MED ORDER — IBUPROFEN 800 MG PO TABS
800.0000 mg | ORAL_TABLET | Freq: Three times a day (TID) | ORAL | Status: DC
  Administered 2019-03-30 – 2019-04-01 (×6): 800 mg via ORAL
  Filled 2019-03-29 (×6): qty 1

## 2019-03-29 MED ORDER — METOCLOPRAMIDE HCL 5 MG/ML IJ SOLN
INTRAMUSCULAR | Status: DC | PRN
  Administered 2019-03-29: 10 mg via INTRAVENOUS

## 2019-03-29 MED ORDER — COCONUT OIL OIL: 1.0000 "application " | TOPICAL_OIL | Status: DC | PRN

## 2019-03-29 MED ORDER — ALBUTEROL SULFATE (2.5 MG/3ML) 0.083% IN NEBU: 2.5000 mg | INHALATION_SOLUTION | Freq: Four times a day (QID) | RESPIRATORY_TRACT | Status: DC | PRN

## 2019-03-29 MED ORDER — MORPHINE SULFATE (PF) 0.5 MG/ML IJ SOLN
INTRAMUSCULAR | Status: DC | PRN
  Administered 2019-03-29: .15 ug via INTRATHECAL

## 2019-03-29 MED ORDER — FENTANYL CITRATE (PF) 100 MCG/2ML IJ SOLN
INTRAMUSCULAR | Status: DC | PRN
  Administered 2019-03-29: 15 ug via INTRATHECAL

## 2019-03-29 MED ORDER — OXYTOCIN 40 UNITS IN NORMAL SALINE INFUSION - SIMPLE MED: 2.5000 [IU]/h | INTRAVENOUS | Status: AC

## 2019-03-29 MED ORDER — CEFAZOLIN SODIUM-DEXTROSE 2-4 GM/100ML-% IV SOLN: 2.0000 g | INTRAVENOUS | Status: DC

## 2019-03-29 MED ORDER — SIMETHICONE 80 MG PO CHEW
80.0000 mg | CHEWABLE_TABLET | Freq: Three times a day (TID) | ORAL | Status: DC
  Administered 2019-03-30 – 2019-04-01 (×7): 80 mg via ORAL
  Filled 2019-03-29 (×8): qty 1

## 2019-03-29 MED ORDER — DEXAMETHASONE SODIUM PHOSPHATE 4 MG/ML IJ SOLN
INTRAMUSCULAR | Status: AC
  Filled 2019-03-29: qty 7

## 2019-03-29 MED ORDER — SCOPOLAMINE 1 MG/3DAYS TD PT72
MEDICATED_PATCH | TRANSDERMAL | Status: AC
  Filled 2019-03-29: qty 1

## 2019-03-29 MED ORDER — OXYTOCIN 40 UNITS IN NORMAL SALINE INFUSION - SIMPLE MED
INTRAVENOUS | Status: AC
  Filled 2019-03-29: qty 1000

## 2019-03-29 MED ORDER — ENOXAPARIN SODIUM 60 MG/0.6ML ~~LOC~~ SOLN
50.0000 mg | SUBCUTANEOUS | Status: DC
  Administered 2019-03-30 – 2019-04-01 (×3): 50 mg via SUBCUTANEOUS
  Filled 2019-03-29 (×3): qty 0.6

## 2019-03-29 MED ORDER — PHENYLEPHRINE HCL (PRESSORS) 10 MG/ML IV SOLN
INTRAVENOUS | Status: DC | PRN
  Administered 2019-03-29: 80 ug via INTRAVENOUS

## 2019-03-29 MED ORDER — DIBUCAINE (PERIANAL) 1 % EX OINT: 1.0000 "application " | TOPICAL_OINTMENT | CUTANEOUS | Status: DC | PRN

## 2019-03-29 MED ORDER — MENTHOL 3 MG MT LOZG: 1.0000 | LOZENGE | OROMUCOSAL | Status: DC | PRN

## 2019-03-29 MED ORDER — BUPIVACAINE HCL (PF) 0.5 % IJ SOLN
INTRAMUSCULAR | Status: DC | PRN
  Administered 2019-03-29: 30 mL

## 2019-03-29 MED ORDER — LACTATED RINGERS IV SOLN
INTRAVENOUS | Status: DC
  Administered 2019-03-30: 01:00:00 via INTRAVENOUS

## 2019-03-29 MED ORDER — PHENYLEPHRINE HCL-NACL 20-0.9 MG/250ML-% IV SOLN
INTRAVENOUS | Status: AC
  Filled 2019-03-29: qty 250

## 2019-03-29 MED ORDER — LACTATED RINGERS IV SOLN
INTRAVENOUS | Status: DC
  Administered 2019-03-29 (×2): via INTRAVENOUS

## 2019-03-29 MED ORDER — PRENATAL MULTIVITAMIN CH
1.0000 | ORAL_TABLET | Freq: Every day | ORAL | Status: DC
  Administered 2019-03-30 – 2019-03-31 (×2): 1 via ORAL
  Filled 2019-03-29 (×3): qty 1

## 2019-03-29 MED ORDER — METOCLOPRAMIDE HCL 5 MG/ML IJ SOLN
INTRAMUSCULAR | Status: AC
  Filled 2019-03-29: qty 2

## 2019-03-29 MED ORDER — ACETAMINOPHEN 10 MG/ML IV SOLN
1000.0000 mg | Freq: Once | INTRAVENOUS | Status: DC | PRN
  Administered 2019-03-29: 1000 mg via INTRAVENOUS

## 2019-03-29 MED ORDER — PHENYLEPHRINE 40 MCG/ML (10ML) SYRINGE FOR IV PUSH (FOR BLOOD PRESSURE SUPPORT)
PREFILLED_SYRINGE | INTRAVENOUS | Status: AC
  Filled 2019-03-29: qty 10

## 2019-03-29 MED ORDER — DEXAMETHASONE SODIUM PHOSPHATE 4 MG/ML IJ SOLN
INTRAMUSCULAR | Status: DC | PRN
  Administered 2019-03-29: 4 mg via INTRAVENOUS

## 2019-03-29 MED ORDER — SODIUM CHLORIDE 0.9 % IV SOLN
INTRAVENOUS | Status: DC | PRN
  Administered 2019-03-29: 17:00:00 via INTRAVENOUS

## 2019-03-29 MED ORDER — SCOPOLAMINE 1 MG/3DAYS TD PT72
MEDICATED_PATCH | TRANSDERMAL | Status: DC | PRN
  Administered 2019-03-29: 1 via TRANSDERMAL

## 2019-03-29 MED ORDER — CEFAZOLIN SODIUM-DEXTROSE 2-3 GM-%(50ML) IV SOLR
INTRAVENOUS | Status: DC | PRN
  Administered 2019-03-29: 2 g via INTRAVENOUS

## 2019-03-29 MED ORDER — BUPIVACAINE IN DEXTROSE 0.75-8.25 % IT SOLN
INTRATHECAL | Status: DC | PRN
  Administered 2019-03-29: 2 mL via INTRATHECAL

## 2019-03-29 MED ORDER — LACTATED RINGERS IV SOLN
INTRAVENOUS | Status: DC | PRN
  Administered 2019-03-29 (×2): via INTRAVENOUS

## 2019-03-29 MED ORDER — DIPHENHYDRAMINE HCL 25 MG PO CAPS: 25.0000 mg | ORAL_CAPSULE | Freq: Four times a day (QID) | ORAL | Status: DC | PRN

## 2019-03-29 MED ORDER — FENTANYL CITRATE (PF) 100 MCG/2ML IJ SOLN
25.0000 ug | INTRAMUSCULAR | Status: DC | PRN
  Administered 2019-03-29: 25 ug via INTRAVENOUS
  Administered 2019-03-29: 50 ug via INTRAVENOUS

## 2019-03-29 MED ORDER — ONDANSETRON HCL 4 MG/2ML IJ SOLN
INTRAMUSCULAR | Status: DC | PRN
  Administered 2019-03-29: 4 mg via INTRAVENOUS

## 2019-03-29 MED ORDER — SODIUM CHLORIDE 0.9 % IV SOLN
INTRAVENOUS | Status: DC | PRN
  Administered 2019-03-29: 60 ug/min via INTRAVENOUS

## 2019-03-29 MED ORDER — ACETAMINOPHEN 10 MG/ML IV SOLN
INTRAVENOUS | Status: AC
  Filled 2019-03-29: qty 100

## 2019-03-29 MED ORDER — ONDANSETRON HCL 4 MG/2ML IJ SOLN
INTRAMUSCULAR | Status: AC
  Filled 2019-03-29: qty 2

## 2019-03-29 MED ORDER — SENNOSIDES-DOCUSATE SODIUM 8.6-50 MG PO TABS
2.0000 | ORAL_TABLET | ORAL | Status: DC
  Administered 2019-03-29 – 2019-03-31 (×3): 2 via ORAL
  Filled 2019-03-29 (×3): qty 2

## 2019-03-29 MED ORDER — BUPIVACAINE HCL (PF) 0.5 % IJ SOLN
INTRAMUSCULAR | Status: AC
  Filled 2019-03-29: qty 30

## 2019-03-29 MED ORDER — ACETAMINOPHEN 325 MG PO TABS
650.0000 mg | ORAL_TABLET | ORAL | Status: DC | PRN
  Administered 2019-03-30 – 2019-03-31 (×4): 650 mg via ORAL
  Filled 2019-03-29 (×4): qty 2

## 2019-03-29 MED ORDER — OXYCODONE HCL 5 MG PO TABS
5.0000 mg | ORAL_TABLET | ORAL | Status: DC | PRN
  Administered 2019-03-30 – 2019-03-31 (×2): 5 mg via ORAL
  Filled 2019-03-29: qty 2
  Filled 2019-03-29: qty 1

## 2019-03-29 MED ORDER — MORPHINE SULFATE (PF) 0.5 MG/ML IJ SOLN
INTRAMUSCULAR | Status: AC
  Filled 2019-03-29: qty 10

## 2019-03-29 MED ORDER — KETOROLAC TROMETHAMINE 30 MG/ML IJ SOLN
30.0000 mg | Freq: Four times a day (QID) | INTRAMUSCULAR | Status: AC
  Administered 2019-03-29 – 2019-03-30 (×3): 30 mg via INTRAVENOUS
  Filled 2019-03-29 (×3): qty 1

## 2019-03-29 MED ORDER — NON FORMULARY: 8.0000 mg | Freq: Three times a day (TID) | Status: DC

## 2019-03-29 MED ORDER — SIMETHICONE 80 MG PO CHEW
80.0000 mg | CHEWABLE_TABLET | ORAL | Status: DC | PRN
  Administered 2019-03-29: 22:00:00 80 mg via ORAL
  Filled 2019-03-29: qty 1

## 2019-03-29 MED ORDER — CEFAZOLIN SODIUM-DEXTROSE 2-4 GM/100ML-% IV SOLN
INTRAVENOUS | Status: AC
  Filled 2019-03-29: qty 100

## 2019-03-29 SURGICAL SUPPLY — 36 items
BARRIER ADHS 3X4 INTERCEED (GAUZE/BANDAGES/DRESSINGS) IMPLANT
CHLORAPREP W/TINT 26ML (MISCELLANEOUS) ×2 IMPLANT
CLAMP CORD UMBIL (MISCELLANEOUS) IMPLANT
CLIP FILSHIE TUBAL LIGA STRL (Clip) ×2 IMPLANT
CLOTH BEACON ORANGE TIMEOUT ST (SAFETY) ×2 IMPLANT
DRSG OPSITE POSTOP 4X10 (GAUZE/BANDAGES/DRESSINGS) ×2 IMPLANT
ELECT REM PT RETURN 9FT ADLT (ELECTROSURGICAL) ×2
ELECTRODE REM PT RTRN 9FT ADLT (ELECTROSURGICAL) ×1 IMPLANT
EXTRACTOR VACUUM KIWI (MISCELLANEOUS) IMPLANT
GLOVE BIO SURGEON STRL SZ 6.5 (GLOVE) ×2 IMPLANT
GLOVE BIOGEL PI IND STRL 7.0 (GLOVE) ×1 IMPLANT
GLOVE BIOGEL PI INDICATOR 7.0 (GLOVE) ×1
GOWN STRL REUS W/TWL LRG LVL3 (GOWN DISPOSABLE) ×4 IMPLANT
KIT ABG SYR 3ML LUER SLIP (SYRINGE) IMPLANT
NEEDLE HYPO 25X5/8 SAFETYGLIDE (NEEDLE) IMPLANT
NEEDLE SPNL 18GX3.5 QUINCKE PK (NEEDLE) ×2 IMPLANT
NS IRRIG 1000ML POUR BTL (IV SOLUTION) ×2 IMPLANT
PACK C SECTION WH (CUSTOM PROCEDURE TRAY) ×2 IMPLANT
PAD OB MATERNITY 4.3X12.25 (PERSONAL CARE ITEMS) ×2 IMPLANT
PENCIL SMOKE EVAC W/HOLSTER (ELECTROSURGICAL) ×2 IMPLANT
SUT PDS AB 0 CTX 60 (SUTURE) IMPLANT
SUT VIC AB 0 CT1 27 (SUTURE)
SUT VIC AB 0 CT1 27XBRD ANBCTR (SUTURE) IMPLANT
SUT VIC AB 0 CT1 36 (SUTURE) IMPLANT
SUT VIC AB 2-0 CT1 27 (SUTURE) ×1
SUT VIC AB 2-0 CT1 TAPERPNT 27 (SUTURE) ×1 IMPLANT
SUT VIC AB 2-0 CTX 36 (SUTURE) ×4 IMPLANT
SUT VIC AB 3-0 CT1 27 (SUTURE) ×1
SUT VIC AB 3-0 CT1 TAPERPNT 27 (SUTURE) ×1 IMPLANT
SUT VIC AB 3-0 SH 27 (SUTURE)
SUT VIC AB 3-0 SH 27X BRD (SUTURE) IMPLANT
SUT VIC AB 4-0 KS 27 (SUTURE) ×2 IMPLANT
SYR 30ML LL (SYRINGE) ×2 IMPLANT
TOWEL OR 17X24 6PK STRL BLUE (TOWEL DISPOSABLE) ×2 IMPLANT
TRAY FOLEY W/BAG SLVR 14FR LF (SET/KITS/TRAYS/PACK) ×2 IMPLANT
WATER STERILE IRR 1000ML POUR (IV SOLUTION) ×2 IMPLANT

## 2019-03-29 NOTE — H&P (Signed)
OBSTETRIC ADMISSION HISTORY AND PHYSICAL  Tiffany Salazar is a 38 y.o. female [id number] with IUP at 4172w0d by L/12 presenting for scheduled repeat LTCS.   Reports fetal movement. Denies vaginal bleeding, leakage of fluids.   [facility]  Support person in labor: Husband   Ultrasounds . 12w0: viability U/S . 19w0: no obvious fetal anomalies  anterior placenta  . 23w0: EFW 64%, 620g . 27w1: EFW 72%, 1165g . 32w1: EFW 65% . 36w6: EFW 54% 3036g  Prenatal History/Complications: . Chronic Hep C - genotype 1a, RNA quant 201k 02/09/19 . 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 AMA . Depression . Polysubstance use history - now on subutex TID  Past Medical History: Past Medical History:  Diagnosis Date  . Complication of anesthesia    problems getting numb  . Hepatitis C   . MRSA infection 03/21/2015  . Subdural hematoma (HCC) 12/26/2013  . Substance use disorder   . Tricuspid valve replaced     Past Surgical History: Past Surgical History:  Procedure Laterality Date  . CESAREAN SECTION  x 3  . TEE WITHOUT CARDIOVERSION N/A 03/23/2018   Procedure: TRANSESOPHAGEAL ECHOCARDIOGRAM (TEE);  Surgeon: Chrystie NoseHilty, Kenneth C, MD;  [facility];  Service: Cardiovascular;  Laterality: N/A;  . TEE WITHOUT CARDIOVERSION N/A 03/29/2018   Procedure: TRANSESOPHAGEAL ECHOCARDIOGRAM (TEE);  Surgeon: Alleen BorneBartle, Bryan K, MD;  [facility];  Service: Open Heart Surgery;  Laterality: N/A;  . TRICUSPID VALVE REPLACEMENT N/A 03/29/2018   Procedure: TRICUSPID VALVE REPLACEMENT Using 27 mm Magna Ease Mitral Valve Pericardial Bioprosthesis;  Surgeon: Alleen BorneBartle, Bryan K, MD;  [facility];  Service: Open Heart Surgery;  Laterality: N/A;    Obstetrical History: OB History    Gravida  7   Para  3   Term  3   Preterm      AB  3   Living  3     SAB  1   TAB  2   Ectopic      Multiple      Live Births  3           Social  History: Social History   Socioeconomic History  . Marital status: Single    Spouse name: Not on file  . Number of children: Not on file  . Years of education: Not on file  . Highest education level: Not on file  Occupational History  . Occupation: unemployed  Social Needs  . Financial resource strain: Not on file  . Food insecurity    Worry: Never true    Inability: Never true  . Transportation needs    Medical: No    Non-medical: No  Tobacco Use  . Smoking status: Current Every Day Smoker    Packs/day: 1.00    Types: Cigarettes  . Smokeless tobacco: Current User  Substance and Sexual Activity  . Alcohol use: Not Currently    Alcohol/week: 0.0 standard drinks    Comment: Pt stated she has started goint ot rehab clinic  . Drug use: Not Currently    Types: IV, Cocaine, Heroin, Marijuana    Comment: Cocaine last use was 04/17/2016. Heroin last use was 04/18/2016. Methadone last use was 04/12/2016. Marijuana 04/18/2016.  Marland Kitchen. Sexual activity: Yes    Partners: Female, Female    Birth control/protection: None  Lifestyle  . Physical activity    Days per week: Not on file    Minutes per session: Not on  file  . Stress: Not on file  Relationships  . Social Herbalist on phone: Not on file    Gets together: Not on file    Attends religious service: Not on file    Active member of club or organization: Not on file    Attends meetings of clubs or organizations: Not on file    Relationship status: Not on file  Other Topics Concern  . Not on file  Social History Narrative   The patient has 3 daughters.  Her oldest daughter was born in 2003 cesarean delivery.  Her middle daughter was born in 2012.  Her youngest daughter was born in the fall 2018.      She is currently living with her boyfriend whose name is [name].  Reports her and [name] recently got engaged as a fall 2019.    Family History: Family History  Problem Relation Age of Onset  . Thyroid disease Mother   .  Breast cancer Mother 64       breast  . Alcohol abuse Father     Allergies: Allergies  Allergen Reactions  . Gentamicin Rash  . Vancomycin Rash    Medications Prior to Admission  Medication Sig Dispense Refill Last Dose  . albuterol (PROVENTIL HFA;VENTOLIN HFA) 108 (90 Base) MCG/ACT inhaler Inhale 2 puffs into the lungs every 6 (six) hours as needed for wheezing or shortness of breath. 1 Inhaler 2   . aspirin EC 81 MG tablet Take 81 mg by mouth daily.     . buprenorphine (SUBUTEX) 8 MG SUBL SL tablet Place 8 mg under the tongue 3 (three) times daily.    03/29/2019 at 1030  . polyethylene glycol (MIRALAX) packet Take 17 g by mouth 2 (two) times daily. May increase to three times a day if no result (Patient taking differently: Take 17 g by mouth daily. ) 14 each 5   . Prenatal Vit-Fe Fumarate-FA (PREPLUS) 27-1 MG TABS Take 1 tablet by mouth daily. 30 tablet 13      Review of Systems  All systems reviewed and negative except as stated in HPI  Blood pressure 124/69, pulse 66, temperature 98.2 F (36.8 C), temperature source Oral, resp. rate 20, height 5\' 7"  (1.702 m), weight 98.4 kg, last menstrual period 06/29/2018, SpO2 97 %, unknown if currently breastfeeding. General appearance: alert, well-appearing, NAD Lungs: no respiratory distress Heart: regular rate  Abdomen: soft, non-tender; gravid  Pelvic: deferred Extremities: no significant LE edema    Prenatal labs: ABO, Rh: --/--/PENDING (08/04 1352) Antibody: PENDING (08/04 1352) Rubella: 2.24 (12/11 1112) RPR: Non Reactive (05/13 0943)  HBsAg: Negative (12/11 1112)  HIV: Non Reactive (05/13 0943)  GBS:   negative Glucola: normal 2-hr Genetic screening:  Low risk NIPS  Prenatal Transfer Tool  Maternal Diabetes: No Genetic Screening: Normal Maternal Ultrasounds/Referrals: Normal Fetal Ultrasounds or other Referrals:  None Maternal Substance Abuse:  Denies currently, though history of cocaine, opioids  tobacco  use Significant Maternal Medications:  Suboxone, miralax, PNV, ASA Significant Maternal Lab Results: Other: Hep C positive with detectable viral load   Results for orders placed or performed during the hospital encounter of 03/29/19 (from the past 24 hour(s))  CBC   Collection Time: 03/29/19  1:52 PM  Result Value Ref Range   WBC 9.7 4.0 - 10.5 K/uL   RBC 3.74 (L) 3.87 - 5.11 MIL/uL   Hemoglobin 11.5 (L) 12.0 - 15.0 g/dL   HCT 36.1 36.0 - 46.0 %  MCV 96.5 80.0 - 100.0 fL   MCH 30.7 26.0 - 34.0 pg   MCHC 31.9 30.0 - 36.0 g/dL   RDW 16.113.0 09.611.5 - 04.515.5 %   Platelets 135 (L) 150 - 400 K/uL   nRBC 0.0 0.0 - 0.2 %  Type and screen [facility]   Collection Time: 03/29/19  1:52 PM  Result Value Ref Range   ABO/RH(D) PENDING    Antibody Screen PENDING    Sample Expiration      04/01/2019,2359 Performed at [facility], [address] [place], [place] [address]     Patient Active Problem List   Diagnosis Date Noted  . Obesity in pregnancy 03/21/2019  . Unwanted fertility 02/09/2019  . Hepatitis C, chronic, maternal, antepartum (HCC) 01/05/2019  . Pregnancy complicated by subutex maintenance, antepartum (HCC) 10/20/2018  . Supervision of high risk pregnancy, antepartum 09/20/2018  . History of tricuspid valve replacement with bioprosthetic valve 09/20/2018  . AMA (advanced maternal age) multigravida 35+, unspecified trimester 09/20/2018  . Tobacco use disorder 08/02/2018  . Hepatitis C antibody positive in blood   . 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 Depression 11/13/2016  . Previous cesarean section complicating pregnancy, antepartum condition or complication 03/21/2015  . Substance use disorder 12/30/2013    Assessment/Plan:  Tiffany Salazar is a 38 y.o. [id number] at 587w0d here for scheduled repeat LTCS.  The risks of cesarean section discussed with the patient included but were not limited to: bleeding which may require transfusion or  reoperation; infection which may require antibiotics; injury to bowel, bladder, ureters or other surrounding organs; injury to the fetus; need for additional procedures including hysterectomy in the event of a life-threatening hemorrhage; placental abnormalities wth subsequent pregnancies, incisional problems, thromboembolic phenomenon and other postoperative/anesthesia complications. The patient concurred with the proposed plan, giving informed written consent for the procedure.   Patient has been NPO since last night, and she will remain NPO for procedure. Anesthesia and OR aware. Preoperative prophylactic antibiotics and SCDs ordered on call to the OR.  To OR when ready.  Patient also desires permanent sterilization.  Other reversible forms of contraception were discussed with patient; she declines all other modalities. Risks of procedure discussed with patient including but not limited to: risk of regret, permanence of method, bleeding, infection, injury to surrounding organs and need for additional procedures.  Failure risk of 1-2% with increased risk of ectopic gestation if pregnancy occurs was also discussed with patient.  The patient concurred with the proposed plan, giving informed written consent for the procedures.    Postpartum Planning -- breast/BTL -- RI/[x] Tdap   Arval Brandstetter S. Earlene PlaterWallace, DO OB/GYN Fellow

## 2019-03-29 NOTE — Transfer of Care (Signed)
Immediate Anesthesia Transfer of Care Note  Patient: Tiffany Salazar  Procedure(s) Performed: CESAREAN SECTION WITH BILATERAL TUBAL LIGATION (Bilateral )  Patient Location: PACU  Anesthesia Type:Spinal  Level of Consciousness: awake, alert  and oriented  Airway & Oxygen Therapy: Patient Spontanous Breathing  Post-op Assessment: Report given to RN and Post -op Vital signs reviewed and stable  Post vital signs: Reviewed and stable  Last Vitals:  Vitals Value Taken Time  BP    Temp    Pulse    Resp    SpO2      Last Pain:  Vitals:   03/29/19 1330  TempSrc: Oral  PainSc: 0-No pain      Patients Stated Pain Goal: 4 (58/09/98 3382)  Complications: No apparent anesthesia complications

## 2019-03-29 NOTE — Op Note (Addendum)
03/29/2019  5:15 PM  PATIENT:  Tiffany Salazar  38 y.o. female  PRE-OPERATIVE DIAGNOSIS:  Previous C/S x 3, unwanted fertility   POST-OPERATIVE DIAGNOSIS:  same   PROCEDURE:  Procedure(s): CESAREAN SECTION WITH BILATERAL TUBAL LIGATION (Bilateral)  SURGEON:  Surgeon(s) and Role:    * Elgie Landino, Wilhemina Cash, MD - Primary  ASSISTANTS: Gerri Lins, DO   ANESTHESIA:   local and spinal  EBL:  500 cc  BLOOD ADMINISTERED:none  DRAINS: Urinary Catheter (Foley)   LOCAL MEDICATIONS USED:  MARCAINE     SPECIMEN:  Source of Specimen:  cord blood  DISPOSITION OF SPECIMEN:  PATHOLOGY  COUNTS:  YES  TOURNIQUET:  * No tourniquets in log *  DICTATION: .Dragon Dictation  PLAN OF CARE: Admit to inpatient   PATIENT DISPOSITION:  PACU - hemodynamically stable.   Delay start of Pharmacological VTE agent (>24hrs) due to surgical blood loss or risk of bleeding: not applicable The risks, benefits, and alternatives of surgery were explained, understood, accepted. Consents were signed. All questions were answered. In the operating room spinal anesthesia was applied without complication. Her abdomen and vagina were prepped and draped in the usual sterile fashion. A Foley catheter was placed, draining clear urine throughout case. Timeout procedure was done. After adequate anesthesia was assured 30 mL for 0.5% Marcaine was injected into the subcutaneous tissue at the site of her previous cesarean. An incision was made through the previous incision. The incision was carried down through the subcutaneous tissue to the fascia. The fascia was scored the midline and extended bilaterally. The middle 50% of the rectus muscles were separated in a transverse fashion using electrosurgical technique. Excellent hemostasis was maintained. The peritoneum was entered with hemostats. Peritoneal incision was extended bilaterally with the Bovie. The bladder blade was placed. A transverse incision was made on the well-developed  lower uterine segment. The uterine incision was extended with traction on each side. Amniotomy was performed with a hemostat. Clear fluid was noted. The baby was delivered from a vertex presentation.the mouth and nostrils were suctioned prior to delivery of the shoulders.  The baby's cord was clamped and cut and was transferred to the NICU personnel for routine care. The placenta was delivered intact with traction. The uterus was left in situ and the interior was cleaned with a dry lap sponge. The uterine incision was closed with 2-0 Vicryl running locking suture. Excellent hemostasis was noted. By tilting the uterus each side was able to visualize the adnexa, and they were normal. A Filsche clip was placed across the entire oviduct in the isthmic region after tracing the tube to the fimbriated end.  The rectus fascia rectus muscles were noted be hemostatic as well. The fascia was closed with a #1 PDS loop in a running nonlocking fashion. No defects were palpable. The subcutaneous tissue was irrigated, clean, and dried. A subcuticular closure was done with a 3-0 Vicryl suture. Steri-Strips are placed. Excellent cosmetic results were obtained. She was taken to the recovery room in stable condition. She tolerated the procedure well.

## 2019-03-29 NOTE — Discharge Summary (Signed)
Obstetrics Discharge Summary [facility]N Faculty [facility]   Patient Name: Tiffany Salazar DOB: 05/01/1981 MRN: [id number]  Date of admission: 03/29/2019 Delivering MD: Allie BossierVE, MYRA C   Date of discharge: 04/01/2019  Admitting diagnosis: RCS Undesired Fertility Intrauterine pregnancy: 402w0d     Secondary diagnosis:   Principal Problem:   Cesarean delivery delivered Active Problems:   Substance use disorder   Previous cesarean section complicating pregnancy, antepartum condition or complication   Hepatitis C antibody positive in blood   History of tricuspid valve replacement with bioprosthetic valve   AMA (advanced maternal age) multigravida 35+, unspecified trimester   Pregnancy complicated by subutex maintenance, antepartum (HCC)   Depression   Hepatitis C, chronic, maternal, antepartum (HCC)    Discharge diagnosis: Term Pregnancy Delivered                                            Postpartum procedures: BTL Complications: none  Outpatient Follow-Up: [ ]  incision check [ ]  ensure follow-up for suboxone [ ]  ensure ID follow-up for chronic hepatitis C  Hospital course: Tiffany Salazar is a 38 y.o. 612w0d who was admitted for scheduled repeat LTCS. Her pregnancy was complicated by above noted. Delivery was uncomplicated, she had a BTL performed with Filshie clips as well. Please see delivery/op note for additional details. Her postpartum course was uncomplicated. She was breastfeeding without difficulty. By day of discharge, she was passing flatus, urinating, eating and drinking without difficulty. Her pain was well-controlled, and she was discharged home with Oxycodone and Ibuprofen. She will follow-up in clinic in 2 weeks.   Physical exam  Vitals:   03/31/19 0527 03/31/19 1419 03/31/19 2143 04/01/19 0600  BP: 102/65 (!) 96/58 115/72 114/85  Pulse: (!) 50 61 (!) 57 (!) 57  Resp: 16 16 16 18   Temp: 98 F (36.7 C) 98.3 F (36.8 C) 97.9 F (36.6 C) 98 F (36.7 C)  TempSrc: Oral Oral Oral    SpO2: 99% 99% 100% 100%  Weight:      Height:       General: well appearing, NAD Lochia: appropriate Uterine Fundus: firm Incision: Healing well with no significant drainage DVT Evaluation: No evidence of DVT seen on physical exam.  Labs: Lab Results  Component Value Date   WBC 18.1 (H) 03/30/2019   HGB 10.5 (L) 03/30/2019   HCT 32.8 (L) 03/30/2019   MCV 96.8 03/30/2019   PLT 129 (L) 03/30/2019   CMP Latest Ref Rng & Units 03/30/2019  Glucose 65 - 99 mg/dL -  BUN 6 - 20 mg/dL -  Creatinine 4.090.44 - 8.111.00 mg/dL 9.140.73  Sodium 782134 - 956144 mmol/L -  Potassium 3.5 - 5.2 mmol/L -  Chloride 96 - 106 mmol/L -  CO2 20 - 29 mmol/L -  Calcium 8.7 - 10.2 mg/dL -  Total Protein 6.0 - 8.5 g/dL -  Total Bilirubin 0.0 - 1.2 mg/dL -  Alkaline Phos 39 - 213117 IU/L -  AST 0 - 40 IU/L -  ALT 0 - 32 IU/L -    Discharge instructions: Per After Visit Summary and "Baby and Me Booklet"  After visit meds:  Allergies as of 04/01/2019      Reactions   Gentamicin Rash   Vancomycin Rash      Medication List    TAKE these medications   albuterol 108 (90 Base) MCG/ACT inhaler Commonly known  as: VENTOLIN HFA Inhale 2 puffs into the lungs every 6 (six) hours as needed for wheezing or shortness of breath.   aspirin EC 81 MG tablet Take 81 mg by mouth daily.   buprenorphine 8 MG Subl SL tablet Commonly known as: SUBUTEX Place 8 mg under the tongue 3 (three) times daily.   ibuprofen 800 MG tablet Commonly known as: ADVIL Take 1 tablet (800 mg total) by mouth 3 (three) times daily with meals as needed for headache, mild pain, moderate pain or cramping.   oxyCODONE 5 MG immediate release tablet Commonly known as: Oxy IR/ROXICODONE Take 1 tablet (5 mg total) by mouth every 4 (four) hours as needed for severe pain or breakthrough pain.   polyethylene glycol 17 g packet Commonly known as: MiraLax Take 17 g by mouth 2 (two) times daily. May increase to three times a day if no result What changed:    when to take this  additional instructions   PrePLUS 27-1 MG Tabs Take 1 tablet by mouth daily.   senna-docusate 8.6-50 MG tablet Commonly known as: Senokot-S Take 2 tablets by mouth at bedtime as needed for mild constipation or moderate constipation.            Discharge Care Instructions  (From admission, onward)         Start     Ordered   04/01/19 0000  Discharge wound care:    Comments: As per discharge handout and nursing instructions   04/01/19 0743          Postpartum contraception: Tubal Ligation with Filshie Clips  Diet: Routine Diet Activity: Advance as tolerated. Pelvic rest for 6 weeks.   Follow-up Appt: Future Appointments  Date Time Provider [place]  04/04/2019  4:15 PM Tresea Mall, CNM [facility]   Follow-up Visit:No follow-ups on file.  Please schedule this patient for Postpartum visit in: 4 weeks with the following provider: Any provider For C/S patients schedule nurse incision check in weeks 2 weeks: yes High risk pregnancy complicated by: chronic hep C, polysubstance use on subutex, AMA Delivery mode:  CS Anticipated Birth Control:  BTL done PP PP Procedures needed: Incision check  Schedule Integrated BH visit: no  Newborn Data: Live born female  Birth Weight:  3290g APGAR: 62, 9   Newborn Delivery   Birth date/time: 03/29/2019 16:50:00 Delivery type: C-Section, Low Transverse Trial of labor: No C-section categorization: Repeat     Baby Feeding: Breast Disposition:rooming in   Verita Schneiders, MD, [place], [facility] for [employer], [place]

## 2019-03-29 NOTE — Anesthesia Preprocedure Evaluation (Signed)
Anesthesia Evaluation  Patient identified by MRN, date of birth, ID band Patient awake    Reviewed: Allergy & Precautions, NPO status , Patient's Chart, lab work & pertinent test results  Airway Mallampati: II  TM Distance: >3 FB Neck ROM: Full    Dental no notable dental hx.    Pulmonary neg pulmonary ROS, Current Smoker,    Pulmonary exam normal breath sounds clear to auscultation       Cardiovascular Normal cardiovascular exam+ Valvular Problems/Murmurs (endocarditis s/p TV replacement 2019)  Rhythm:Regular Rate:Normal     Neuro/Psych PSYCHIATRIC DISORDERS Depression negative neurological ROS     GI/Hepatic negative GI ROS, (+)     substance abuse  , Hepatitis -, C  Endo/Other  negative endocrine ROS  Renal/GU negative Renal ROS  negative genitourinary   Musculoskeletal negative musculoskeletal ROS (+)   Abdominal   Peds  Hematology  (+) Blood dyscrasia (Hgb 11.5, plt 135), anemia ,   Anesthesia Other Findings   Reproductive/Obstetrics (+) Pregnancy                             Anesthesia Physical Anesthesia Plan  ASA: III  Anesthesia Plan: Spinal   Post-op Pain Management:    Induction:   PONV Risk Score and Plan: Treatment may vary due to age or medical condition  Airway Management Planned: Natural Airway  Additional Equipment:   Intra-op Plan:   Post-operative Plan:   Informed Consent: I have reviewed the patients History and Physical, chart, labs and discussed the procedure including the risks, benefits and alternatives for the proposed anesthesia with the patient or authorized representative who has indicated his/her understanding and acceptance.     Dental advisory given  Plan Discussed with: CRNA  Anesthesia Plan Comments:         Anesthesia Quick Evaluation

## 2019-03-29 NOTE — Anesthesia Procedure Notes (Signed)
Spinal  Patient location during procedure: OR Start time: 03/29/2019 4:19 PM End time: 03/29/2019 4:29 PM Staffing Anesthesiologist: Freddrick March, MD Performed: anesthesiologist  Preanesthetic Checklist Completed: patient identified, surgical consent, pre-op evaluation, timeout performed, IV checked, risks and benefits discussed and monitors and equipment checked Spinal Block Patient position: sitting Prep: site prepped and draped and DuraPrep Patient monitoring: cardiac monitor, continuous pulse ox and blood pressure Approach: midline Location: L3-4 Injection technique: single-shot Needle Needle type: Pencan  Needle gauge: 24 G Needle length: 9 cm Assessment Sensory level: T6 Additional Notes Functioning IV was confirmed and monitors were applied. Sterile prep and drape, including hand hygiene and sterile gloves were used. The patient was positioned and the spine was prepped. The skin was anesthetized with lidocaine.  Free flow of clear CSF was obtained prior to injecting local anesthetic into the CSF.  The spinal needle aspirated freely following injection.  The needle was carefully withdrawn.  The patient tolerated the procedure well.

## 2019-03-29 NOTE — Progress Notes (Signed)
RN Lorrin Goodell called OB resident Corliss Blacker at [phone] to inquire about Toradol administration with platelets of 135. Dr. Sylvester Harder advised RN to give ordered Toradol for pain management.

## 2019-03-30 ENCOUNTER — Encounter (HOSPITAL_COMMUNITY): Payer: Self-pay | Admitting: Obstetrics & Gynecology

## 2019-03-30 LAB — CBC
HCT: 32.8 % — ABNORMAL LOW (ref 36.0–46.0)
Hemoglobin: 10.5 g/dL — ABNORMAL LOW (ref 12.0–15.0)
MCH: 31 pg (ref 26.0–34.0)
MCHC: 32 g/dL (ref 30.0–36.0)
MCV: 96.8 fL (ref 80.0–100.0)
Platelets: 129 10*3/uL — ABNORMAL LOW (ref 150–400)
RBC: 3.39 MIL/uL — ABNORMAL LOW (ref 3.87–5.11)
RDW: 13 % (ref 11.5–15.5)
WBC: 18.1 10*3/uL — ABNORMAL HIGH (ref 4.0–10.5)
nRBC: 0 % (ref 0.0–0.2)

## 2019-03-30 LAB — CREATININE, SERUM
Creatinine, Ser: 0.73 mg/dL (ref 0.44–1.00)
GFR calc Af Amer: >60 mL/min (ref >60)
GFR calc non Af Amer: >60 mL/min (ref >60)

## 2019-03-30 MED ORDER — MUSCLE RUB 10-15 % EX CREA
TOPICAL_CREAM | CUTANEOUS | Status: DC | PRN
  Administered 2019-03-30: 22:00:00 via TOPICAL
  Filled 2019-03-30: qty 85

## 2019-03-30 NOTE — Anesthesia Postprocedure Evaluation (Signed)
Anesthesia Post Note  Patient: Tiffany Salazar  Procedure(s) Performed: CESAREAN SECTION WITH BILATERAL TUBAL LIGATION (Bilateral )     Patient location during evaluation: PACU Anesthesia Type: Spinal Level of consciousness: oriented and awake and alert Pain management: pain level controlled Vital Signs Assessment: post-procedure vital signs reviewed and stable Respiratory status: spontaneous breathing, respiratory function stable and patient connected to nasal cannula oxygen Cardiovascular status: blood pressure returned to baseline and stable Postop Assessment: no headache, no backache and no apparent nausea or vomiting Anesthetic complications: no    Last Vitals:  Vitals:   03/30/19 0040 03/30/19 0330  BP:  113/72  Pulse:    Resp: 18 18  Temp:  36.7 C  SpO2: 100% 100%    Last Pain:  Vitals:   03/30/19 0532  TempSrc:   PainSc: 7                  Chelsey L Woodrum

## 2019-03-30 NOTE — Progress Notes (Signed)
Pt reports pain near shoulder joint. Had similar pain with last CS. Requesting biofreeze. Muscle rub ordered.

## 2019-03-30 NOTE — Lactation Note (Signed)
This note was copied from a baby's chart. Lactation Consultation Note Baby 39 hrs old.  Mom states baby BF well after delivery and this is his best feeding since, Mom feeding baby in football hold BF. Baby in football hold. BF well. LC gave support under baby's head.  Encouraged cheeks to breast to obtain deep latch. Mom in a lot of pain from cramping and abd. Check. Reviewed w/mom if she notices cracked or bleeding nipples not to BF, may pump and dump until healed. Mom shook her head yes. Encouraged breast massage while feeding at intervals. Newborn feeding habits, STS, I&O, supply and demand discussed. Encouraged to call for assistance or questions. Lactation brochure given.  Patient Name: Boy Anniebell Bedore OMBTD'H Date: 03/30/2019 Reason for consult: Initial assessment;Term;1st time breastfeeding   Maternal Data Has patient been taught Hand Expression?: Yes Does the patient have breastfeeding experience prior to this delivery?: Yes  Feeding Feeding Type: Breast Fed  LATCH Score Latch: Grasps breast easily, tongue down, lips flanged, rhythmical sucking.  Audible Swallowing: Spontaneous and intermittent  Type of Nipple: Everted at rest and after stimulation  Comfort (Breast/Nipple): Soft / non-tender  Hold (Positioning): Assistance needed to correctly position infant at breast and maintain latch.  LATCH Score: 9  Interventions Interventions: Breast feeding basics reviewed;Skin to skin;Position options;Breast massage;Breast compression;Hand express  Lactation Tools Discussed/Used WIC Program: Yes   Consult Status Consult Status: Follow-up Date: 03/31/19 Follow-up type: In-patient    Estreya Clay, [place] 03/30/2019, 5:50 AM

## 2019-03-30 NOTE — Clinical Social Work Maternal (Signed)
CLINICAL SOCIAL WORK MATERNAL/CHILD NOTE  Patient Details  Name: Tiffany Salazar MRN: [id number] Date of Birth: [birth year]  Date:  03/30/2019  Clinical Social Worker Initiating Note:  Elijio Miles Date/Time: Initiated:  03/30/19/1058     Child's Name:  Tiffany Salazar   Biological Parents:  Mother, Father(Tiffany Salazar and Tiffany Salazar DOB: 08/25/1975)   Need for Interpreter:  None   Reason for Referral:  Current Substance Use/Substance Use During Pregnancy (MOB on Subutex)   Address:  [address] [place] [address]    Phone number:  [phone] (home)     Additional phone number:   Household Members/Support Persons (HM/SP):   Household Member/Support Person 1, Household Member/Support Person 2, Household Member/Support Person 3, Household Member/Support Person 4   HM/SP Name Relationship DOB or Age  HM/SP -1 Tiffany Salazar FOB 08/25/1975  HM/SP -2 Tiffany Salazar Daughter (MOB has full custody) 09/28/2001  HM/SP -3 Tiffany Salazar Daughter (MGM has temporary custody but stays with MOB) 05/06/2011  HM/SP -4 Tiffany Salazar Daughter (MOB's aunt has temporary custody but MOB is in the process of obtaining full custody) 11/13/2016  HM/SP -5        HM/SP -6        HM/SP -7        HM/SP -8          Natural Supports (not living in the home):  Extended Family, Immediate Family, Friends, Other (Comment), Radiographer, therapeutic Supports: Case Metallurgist, Organized support group (Comment), Therapist(MOB has a Transport planner through [employer] and also attends [employer] and has a Publishing copy)   Employment: Ship broker   Type of Work:     Education:  Attending college   Homebound arranged:    Museum/gallery curator Resources:  Medicaid   Other Resources:  Physicist, medical , [place] Considerations Which May Impact Care:    Strengths:  Ability to meet basic needs , Home prepared for child , Pediatrician chosen   Psychotropic Medications:         Pediatrician:     Solicitor area  Pediatrician List:   Passenger transport manager)  [place]      Pediatrician Fax Number:    Risk Factors/Current Problems:  Substance Use (MOB has history of substance use but is currently taking Subutex and has a large support system and services in place.)   Cognitive State:  Able to Concentrate , Alert    Mood/Affect:  Bright , Calm , Comfortable , Interested , Happy , Relaxed    CSW Assessment:  CSW received consult for history of substance use currently on Subutex.  CSW met with MOB to offer support and complete assessment.    MOB sitting on the side of the bed with infant asleep in basinet and FOB present at bedside. CSW introduced self and requested that FOB step out of the room to complete assessment. MOB requested that FOB be allowed to stay and gave CSW verbal permission to discuss anything in front of FOB. MOB and FOB both pleasant and engaged throughout assessment. FOB did not speak much as he was filling out paperwork but was attentive to CSW's questions and supportive of MOB throughout assessment. CSW explained reason for consult to which MOB was understanding. MOB reported she currently lives with FOB and her 22 year old daughter. MOB shared that she has two other daughters that are  both in the temporary custody of other family members. MOB stated her mother Tiffany Salazar [phone]) has temporary custody of her middle daughter Tiffany Salazar) but that she often resides with MOB. MOB reported that her aunt Tiffany Salazar [phone]) has temporary custody of her youngest daughter Tiffany Salazar) but shared that she is in phase 3 of a 3 phase plan to get full custody of her. MOB denied any CPS involvement in the placement of her daughters and MOB stated that she voluntarily relinquished her rights and is in the process of custody back. 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 MOB confirmed she receives both [facility] and food stamps and is aware of process to get infant added on to her plans.   CSW inquired about MOB's mental health history and MOB noted some minor anxiety and depression "here and there" but feels that it is manageable. MOB shared that she is currently in counseling once a week with [facility] where she receives her Subutex.  MOB also shared that she participates in group counseling once a week, attends NA meetings and has a sponsor. MOB shared with CSW that she has been clean for a year now and feels the Subutex is effective in managing her symptoms. CSW praised MOB for her year of sobriety. MOB reported having a very supportive support system consisting of FOB, FOB's mother, her sponsor, group members and counselor. MOB denied any history of PPD in prior pregnancies but was open to education. CSW provided education regarding the baby blues period vs. perinatal mood disorders. CSW encouraged MOB to reach out to counselor if concerns arise. CSW recommended self-evaluation during the postpartum time period using the New Mom Checklist from Postpartum Progress and encouraged MOB to contact a medical professional if symptoms are noted at any time. MOB denied any current SI or HI and did not appear to experiencing any acute mental health symptoms.   CSW informed MOB of Hospital Drug Policy due to MOB's Subutex use during pregnancy and explained UDS came back negative but that CDS was still pending and that a CPS report would be made, if warranted. MOB and FOB expressed understanding and denied any questions or concerns regarding policy or report. MOB denied having any history of CPS involvement.   MOB confirmed having all essential items for infant once discharged and stated infant would be sleeping in a basinet once home. CSW provided review of Sudden Infant  Death Syndrome (SIDS) precautions and safe sleeping habits.     CSW Plan/Description:  No Further Intervention Required/No Barriers to Discharge, Sudden Infant Death Syndrome (SIDS) Education, Perinatal Mood and Anxiety Disorder (PMADs) Education, [place], CSW Will Continue to Monitor Umbilical Cord Tissue Drug Screen Results and Make Report if Foye Spurling, [place] 03/30/2019, 1:48 PM

## 2019-03-30 NOTE — Progress Notes (Signed)
POSTPARTUM PROGRESS NOTE  Subjective: KAYTLIN BURKLOW is a 38 y.o. [phone] s/p rLTCS/BTL at [redacted]w[redacted]d.  She reports she doing well. No acute events overnight. She denies any problems with ambulating, voiding or po intake. Denies nausea or vomiting. She has not passed flatus. Pain is well controlled.  Lochia is improving.  Objective: Blood pressure 113/72, pulse 64, temperature 98 F (36.7 C), resp. rate 18, height 5\' 7"  (1.702 m), weight 98.4 kg, last menstrual period 06/29/2018, SpO2 100 %, unknown if currently breastfeeding.  Physical Exam:  General: alert, cooperative and no distress Chest: no respiratory distress Abdomen: soft, non-tender  incision c/d/i  Uterine Fundus: firm, appropriately tender Extremities: No calf swelling or tenderness  no edema  Recent Labs    03/29/19 1352  HGB 11.5*  HCT 36.1    Assessment/Plan: CHAMIA SCHMUTZ is a 38 y.o. [id number] s/p schedled rLTCS/BTL at [redacted]w[redacted]d  POD#1: Doing well, pain well-controlled. Post op cbc pending. -- Routine postpartum care -- Encouraged up OOB -- Lovenox for VTE prophylaxis  Hepatitis C --genotype 1a, RNA quant 201k 02/09/19 --consider treatment post partum  Polysubstance abuse --stable on subutex TID  Hx MRSA endocarditis  --s/p tricuspid valve replacement 2019 --asymptomatic  Routine Postpartum Care -- Contraception: s/p BTL -- Feeding: breast  Dispo: Plan for discharge home likely on POD#3.  Corliss Blacker, [facility]

## 2019-03-31 LAB — RPR: RPR Ser Ql: NONREACTIVE

## 2019-03-31 MED ORDER — POLYETHYLENE GLYCOL 3350 17 G PO PACK
17.0000 g | PACK | Freq: Every day | ORAL | Status: DC
  Administered 2019-03-31 – 2019-04-01 (×2): 17 g via ORAL
  Filled 2019-03-31 (×2): qty 1

## 2019-03-31 MED ORDER — NICOTINE 21 MG/24HR TD PT24
21.0000 mg | MEDICATED_PATCH | Freq: Every day | TRANSDERMAL | Status: DC
  Administered 2019-03-31 – 2019-04-01 (×2): 21 mg via TRANSDERMAL
  Filled 2019-03-31 (×2): qty 1

## 2019-03-31 NOTE — Discharge Instructions (Signed)

## 2019-03-31 NOTE — Lactation Note (Signed)
This note was copied from a baby's chart. Lactation Consultation Note  Patient Name: Tiffany Salazar EEFEO'F Date: 03/31/2019   RN asked about breastfeeding compatibility of Nicotine patch (L3). Per Marcello Moores Hale's "Medications & Mother's Milk" 2019, "no untoward effects were noted from nicotine patch study" (p. 705). Nicotine patch has a safer breastfeeding compatibility profile than nicotine gum. RN made aware.  Mom also noted to be taking suboxone 8mg  TID (L2).   Matthias Hughs [facility] 03/31/2019, 9:57 AM

## 2019-03-31 NOTE — Lactation Note (Signed)
This note was copied from a baby's chart. Lactation Consultation Note  Patient Name: Boy Corra Kaine NRWCH'J Date: 03/31/2019   Mom was inquiring if infant needed some formula b/c of weight loss. I reminded Mom that infant had had multiple voids & stools in the last 24 hrs alone.   Mom agreeable to being set up with a DEBP.    Matthias Hughs [facility] 03/31/2019, 12:24 PM

## 2019-03-31 NOTE — Progress Notes (Signed)
Subjective: Postpartum Day 2: Cesarean Delivery Patient reports she is overall feeling very well. She is up ad lib and pain is well-controlled. She would like Nicotine patch which has been ordered. She is yet to have a bowel movement and requests Miralax which has been ordered.   Objective: Vital signs in last 24 hours: Temp:  [98 F (36.7 C)-98.3 F (36.8 C)] 98.3 F (36.8 C) (08/06 1419) Pulse Rate:  [50-61] 61 (08/06 1419) Resp:  [16-18] 16 (08/06 1419) BP: (96-113)/(58-77) 96/58 (08/06 1419) SpO2:  [99 %] 99 % (08/06 1419)  Physical Exam:  General: alert and cooperative Lochia: appropriate Uterine Fundus: firm Incision: healing well, no significant drainage, no dehiscence, no significant erythema DVT Evaluation: No evidence of DVT seen on physical exam.  Recent Labs    03/29/19 1352 03/30/19 0615  HGB 11.5* 10.5*  HCT 36.1 32.8*    Assessment/Plan: Status post Cesarean section. Doing well postoperatively.  Continue current care. Reports that she has outpatient Subutex visit already arranged. Plans to room-in with baby after discharge. Circumcision scheduled outpatient.   [place] 03/31/2019, 6:42 PM

## 2019-04-01 ENCOUNTER — Encounter (HOSPITAL_COMMUNITY): Payer: Self-pay | Admitting: *Deleted

## 2019-04-01 MED ORDER — SENNOSIDES-DOCUSATE SODIUM 8.6-50 MG PO TABS: 2.0000 | ORAL_TABLET | Freq: Every evening | ORAL | 2 refills | Status: DC | PRN

## 2019-04-01 MED ORDER — IBUPROFEN 800 MG PO TABS: 800.0000 mg | ORAL_TABLET | Freq: Three times a day (TID) | ORAL | 2 refills | Status: DC | PRN

## 2019-04-01 MED ORDER — OXYCODONE HCL 5 MG PO TABS: 5.0000 mg | ORAL_TABLET | ORAL | 0 refills | Status: DC | PRN

## 2019-04-01 NOTE — Lactation Note (Signed)
This note was copied from a baby's chart. Lactation Consultation Note  Patient Name: Boy Tyne Banta LDJTT'S Date: 04/01/2019 Reason for consult: Follow-up assessment;Term  Subutex, Hep C, nicotine patch  LC in to visit with P5 Mom of term baby at 71 hrs old.  Baby at 7% weight loss, gained 45 gms since yesterday.  Mom on Subutex for opiod dependence.    Baby did receive some small bottles of formula due to weight loss yesterday.  DEBP set up at bedside.  Mom has pumped a couple times collected EBM to feed to baby.  Breasts are filling, and baby at the breast in semi-football hold, encouraged STS (Mom removed baby's sleeper).  Adjusted positioning to provide support under baby's body and under Mom's hand supporting baby's head.  Baby just fed for 30 mins on both breasts.  Hand expression revealed an easy flow of transitional milk.  Both nipples look intact with no trauma noted.  Baby appears contented as mom placed baby STS on her chest.   Talked to Mom about avoiding formula now, and if she wants to supplement baby it is recommended she pump and offer her milk to baby.  Talked about possible engorgement if baby is getting formula.   Pump parts sitting on paper towel by sink.  Parts not disassembled.  Rewashed them after taking all the parts apart.  Placed another bin on other side of wall for pump parts to air dry.  Mom denies having any questions.  Encouraged Mom to ask for help prn.  Interventions Interventions: Skin to skin;Breast massage;Hand express;DEBP;Support pillows;Position options;Adjust position  Lactation Tools Discussed/Used Tools: Pump;Bottle Breast pump type: Double-Electric Breast Pump   Consult Status Consult Status: Follow-up Date: 04/02/19 Follow-up type: In-patient    Broadus John 04/01/2019, 11:51 AM

## 2019-04-02 ENCOUNTER — Ambulatory Visit: Payer: Self-pay

## 2019-04-02 NOTE — Lactation Note (Signed)
This note was copied from a baby's chart. Lactation Consultation Note  Patient Name: Tiffany Salazar [id number] Date: 04/02/2019 Reason for consult: Follow-up assessment;Infant weight loss   Baby 91 hours old.  8.7% weight loss.  Changed yellow seedy stool and void. Mother on subutex for opoid dependence and Hep C +. Mother aware of breastfeeding precautions regarding Hep C. Baby received approx 60 ml of breastmilk at 0900 with slow flow nipple. He was cueing upon entering and mother latched him in football hold. Frequent swallows observed. Discussed supplementing baby when he has short feedings ex 10 min to help stabilize weight loss and encouraged mother to continue post pumping. Mother agreeable.  Mother has 2- DEBP at home, unsure of brands. Discussed also getting a pump from [facility].      Maternal Data    Feeding Feeding Type: Breast Fed  LATCH Score Latch: Grasps breast easily, tongue down, lips flanged, rhythmical sucking.  Audible Swallowing: Spontaneous and intermittent  Type of Nipple: Everted at rest and after stimulation  Comfort (Breast/Nipple): Filling, red/small blisters or bruises, mild/mod discomfort  Hold (Positioning): No assistance needed to correctly position infant at breast.  LATCH Score: 9  Interventions Interventions: DEBP;Breast compression  Lactation Tools Discussed/Used Tools: Pump Breast pump type: Double-Electric Breast Pump   Consult Status Consult Status: Follow-up Date: 04/03/19 Follow-up type: In-patient    Vivianne Master [facility] 04/02/2019, 12:03 PM

## 2019-04-03 ENCOUNTER — Ambulatory Visit: Payer: Self-pay

## 2019-04-03 NOTE — Lactation Note (Signed)
This note was copied from a baby's chart. Lactation Consultation Note  Patient Name: Tiffany Salazar Date: 04/03/2019   Mother on subutex for opoid dependence and Hep C + but aware of precautions w/ breastfeeding - cracked or bleeding.   18 29 days old and latched upon entering w/ intermittent swallows. Feed on demand approximately 8-12 times per day.   Wake baby for feedings if needed. Mother will continue to post pump and supplement short feedings or if baby will not latch. Reviewed engorgement care and monitoring voids/stools.      Maternal Data    Feeding Feeding Type: Breast Fed  LATCH Score                   Interventions    Lactation Tools Discussed/Used     Consult Status      Carlye Grippe 04/03/2019, 9:32 AM

## 2019-04-04 ENCOUNTER — Other Ambulatory Visit: Payer: Self-pay

## 2019-04-04 ENCOUNTER — Telehealth: Payer: Medicaid Other | Admitting: Advanced Practice Midwife

## 2019-04-04 ENCOUNTER — Telehealth: Payer: Medicaid Other | Admitting: Obstetrics & Gynecology

## 2019-04-04 ENCOUNTER — Encounter: Payer: Self-pay | Admitting: Advanced Practice Midwife

## 2019-04-04 VITALS — BP 123/78

## 2019-04-04 DIAGNOSIS — Z5329 Procedure and treatment not carried out because of patient's decision for other reasons: Secondary | ICD-10-CM

## 2019-04-04 DIAGNOSIS — Z91199 Patient's noncompliance with other medical treatment and regimen due to unspecified reason: Secondary | ICD-10-CM

## 2019-04-04 NOTE — Progress Notes (Signed)
CMA spoke with patient. She is one week out from a c-section, and needs an incision check today. However, she was scheduled as a virtual visit. Spoke with front desk and patient was re-scheduled for in person visit for tomorrow.   Marcille Buffy DNP, CNM  04/04/19  3:46 PM

## 2019-04-05 ENCOUNTER — Ambulatory Visit (INDEPENDENT_AMBULATORY_CARE_PROVIDER_SITE_OTHER): Payer: Medicaid Other

## 2019-04-05 ENCOUNTER — Other Ambulatory Visit: Payer: Self-pay

## 2019-04-05 VITALS — BP 127/72 | HR 65 | Wt 217.1 lb

## 2019-04-05 DIAGNOSIS — Z5189 Encounter for other specified aftercare: Secondary | ICD-10-CM

## 2019-04-05 NOTE — Progress Notes (Signed)
Pt here today for incision check s/p c-section on 03/29/19. Incision well approximated, no odor, no drainage, no redness.  Some swelling observed around the incision and pt also has some swelling +1 pitting edema bilateral extremities.  BP 127/72 RA.  Notiifed Milbert Coulter, CNM no new orders.  I advised to continue to ambulate, drink plenty of water, and elevate feet when she can.  I also informed pt that will evaluate her at her pp visit scheduled on 04/26/19.  Pt verbalized understanding.    Mel Almond, RN 04/05/19

## 2019-04-06 ENCOUNTER — Encounter: Payer: Medicaid Other | Admitting: Obstetrics and Gynecology

## 2019-04-06 ENCOUNTER — Other Ambulatory Visit: Payer: Medicaid Other

## 2019-04-10 NOTE — Progress Notes (Signed)
I reviewed the note and agree with the nursing assessment and plan.   Dayquan Buys, CNM 11/20/2017 10:32 AM   

## 2019-04-11 ENCOUNTER — Encounter (HOSPITAL_COMMUNITY): Payer: Self-pay | Admitting: *Deleted

## 2019-04-25 ENCOUNTER — Telehealth: Payer: Self-pay | Admitting: Women's Health

## 2019-04-25 NOTE — Telephone Encounter (Signed)
Attempted to contact patient about her appointment on 9/1 @ 10:55. Patient instructed that the appointment is a mychart visit. Patient instructed to download the mychart app if not already done so. Patient instructed to give the office a call with any concerns.

## 2019-04-26 ENCOUNTER — Telehealth (INDEPENDENT_AMBULATORY_CARE_PROVIDER_SITE_OTHER): Payer: Medicaid Other | Admitting: Women's Health

## 2019-04-26 ENCOUNTER — Encounter: Payer: Self-pay | Admitting: Women's Health

## 2019-04-26 ENCOUNTER — Other Ambulatory Visit: Payer: Self-pay

## 2019-04-26 NOTE — Patient Instructions (Addendum)
Postpartum Tubal Ligation, Care After This sheet gives you information about how to care for yourself after your procedure. Your health care provider may also give you more specific instructions. If you have problems or questions, contact your health care provider. What can I expect after the procedure? After the procedure, you may have:  A sore throat.  Bruising or pain in your back.  Nausea or vomiting.  Dizziness.  Mild abdominal discomfort or pain, such as cramping, gas pain, or feeling bloated.  Soreness around the incision area.  Tiredness.  Pain in your shoulders. Follow these instructions at home: Medicines  Ask your health care provider if the medicine prescribed to you: ? Requires you to avoid driving or using heavy machinery. ? Can cause constipation. You may need to take actions to prevent or treat constipation, such as:  Drink enough fluid to keep your urine pale yellow.  Take over-the-counter or prescription medicines.  Eat foods that are high in fiber, such as beans, whole grains, and fresh fruits and vegetables.  Limit foods that are high in fat and processed sugars, such as fried or sweet foods.  Do not take aspirin because it can cause bleeding. Activity  Rest for the remainder of the day.  Return to your normal activities as told by your health care provider. Ask your health care provider what activities are safe for you.  Do not have sex, douche, or put a tampon or anything else in your vagina for 6 weeks or as long as told by your health care provider.  Do not lift anything that is heavier than your baby for 2 weeks, or the limit that you are told, until your health care provider says that it is safe. Incision care      Follow instructions from your health care provider about how to take care of your incision. Make sure you: ? Wash your hands with soap and water before and after you change your bandage (dressing). If soap and water are not  available, use hand sanitizer. ? Change your dressing as told by your health care provider. ? Leave stitches (sutures), skin glue, or adhesive strips in place. These skin closures may need to stay in place for 2 weeks or longer. If adhesive strip edges start to loosen and curl up, you may trim the loose edges. Do not remove adhesive strips completely unless your health care provider tells you to do that.  Check your incision area every day for signs of infection. Check for: ? Redness, swelling, or pain. ? Fluid or blood. ? Warmth. ? Pus or a bad smell. Other Instructions  Do not take baths, swim, or use a hot tub until your health care provider approves. Ask your health care provider if you may take showers. You may only be allowed to take sponge baths.  Keep all follow-up visits as told by your health care provider. This is important. Contact a health care provider if:  You have redness, swelling, or pain around your incision.  Your incision feels warm to the touch.  Your pain does not improve after 2-3 days.  You have a rash.  You repeatedly become dizzy or lightheaded.  Your pain medicine is not helping.  You are constipated. Get help right away if you:  Have a fever.  Faint.  Have pain in your abdomen that gets worse.  Have fluid or blood coming from your incision.  You have pus or a bad smell coming from your incision.  The  edges of your incision break open after the sutures have been removed.  Have shortness of breath or trouble breathing.  Have chest pain or leg pain.  Have ongoing nausea or diarrhea. Summary  Mild abdominal discomfort is common after this procedure.  Contact your health care provider if you experience problems or have concerns.  Do not lift anything that is heavier than your baby for 2 weeks, or the limit that you are told, until your health care provider says that it is safe.  Keep all follow-up visits as told by your health care  provider. This is important. This information is not intended to replace advice given to you by your health care provider. Make sure you discuss any questions you have with your health care provider. Document Released: 02/10/2012 Document Revised: 07/01/2018 Document Reviewed: 07/01/2018 Elsevier Patient Education  2020 [employer] Postpartum Care After Cesarean Delivery This sheet gives you information about how to care for yourself from the time you deliver your baby to up to 6-12 weeks after delivery (postpartum period). Your health care provider may also give you more specific instructions. If you have problems or questions, contact your health care provider. Follow these instructions at home: Medicines  Take over-the-counter and prescription medicines only as told by your health care provider.  If you were prescribed an antibiotic medicine, take it as told by your health care provider. Do not stop taking the antibiotic even if you start to feel better.  Ask your health care provider if the medicine prescribed to you: ? Requires you to avoid driving or using heavy machinery. ? Can cause constipation. You may need to take actions to prevent or treat constipation, such as:  Drink enough fluid to keep your urine pale yellow.  Take over-the-counter or prescription medicines.  Eat foods that are high in fiber, such as beans, whole grains, and fresh fruits and vegetables.  Limit foods that are high in fat and processed sugars, such as fried or sweet foods. Activity  Gradually return to your normal activities as told by your health care provider.  Avoid activities that take a lot of effort and energy (are strenuous) until approved by your health care provider. Walking at a slow to moderate pace is usually safe. Ask your health care provider what activities are safe for you. ? Do not lift anything that is heavier than your baby or 10 lb (4.5 kg) as told by your health care provider. ? Do  not vacuum, climb stairs, or drive a car for as long as told by your health care provider.  If possible, have someone help you at home until you are able to do your usual activities yourself.  Rest as much as possible. Try to rest or take naps while your baby is sleeping. Vaginal bleeding  It is normal to have vaginal bleeding (lochia) after delivery. Wear a sanitary pad to absorb vaginal bleeding and discharge. ? During the first week after delivery, the amount and appearance of lochia is often similar to a menstrual period. ? Over the next few weeks, it will gradually decrease to a dry, yellow-brown discharge. ? For most women, lochia stops completely by 4-6 weeks after delivery. Vaginal bleeding can vary from woman to woman.  Change your sanitary pads frequently. Watch for any changes in your flow, such as: ? A sudden increase in volume. ? A change in color. ? Large blood clots.  If you pass a blood clot, save it and call your health care  provider to discuss. Do not flush blood clots down the toilet before you get instructions from your health care provider.  Do not use tampons or douches until your health care provider says this is safe.  If you are not breastfeeding, your period should return 6-8 weeks after delivery. If you are breastfeeding, your period may return anytime between 8 weeks after delivery and the time that you stop breastfeeding. Perineal care   If your C-section (Cesarean section) was unplanned, and you were allowed to labor and push before delivery, you may have pain, swelling, and discomfort of the tissue between your vaginal opening and your anus (perineum). You may also have an incision in the tissue (episiotomy) or the tissue may have torn during delivery. Follow these instructions as told by your health care provider: ? Keep your perineum clean and dry as told by your health care provider. Use medicated pads and pain-relieving sprays and creams as directed. ? If  you have an episiotomy or vaginal tear, check the area every day for signs of infection. Check for:  Redness, swelling, or pain.  Fluid or blood.  Warmth.  Pus or a bad smell. ? You may be given a squirt bottle to use instead of wiping to clean the perineum area after you go to the bathroom. As you start healing, you may use the squirt bottle before wiping yourself. Make sure to wipe gently. ? To relieve pain caused by an episiotomy, vaginal tear, or hemorrhoids, try taking a warm sitz bath 2-3 times a day. A sitz bath is a warm water bath that is taken while you are sitting down. The water should only come up to your hips and should cover your buttocks. Breast care  Within the first few days after delivery, your breasts may feel heavy, full, and uncomfortable (breast engorgement). You may also have milk leaking from your breasts. Your health care provider can suggest ways to help relieve breast discomfort. Breast engorgement should go away within a few days.  If you are breastfeeding: ? Wear a bra that supports your breasts and fits you well. ? Keep your nipples clean and dry. Apply creams and ointments as told by your health care provider. ? You may need to use breast pads to absorb milk leakage. ? You may have uterine contractions every time you breastfeed for several weeks after delivery. Uterine contractions help your uterus return to its normal size. ? If you have any problems with breastfeeding, work with your health care provider or a Advertising copywriterlactation consultant.  If you are not breastfeeding: ? Avoid touching your breasts as this can make your breasts produce more milk. ? Wear a well-fitting bra and use cold packs to help with swelling. ? Do not squeeze out (express) milk. This causes you to make more milk. Intimacy and sexuality  Ask your health care provider when you can engage in sexual activity. This may depend on your: ? Risk of infection. ? Healing rate. ? Comfort and desire to  engage in sexual activity.  You are able to get pregnant after delivery, even if you have not had your period. If desired, talk with your health care provider about methods of family planning or birth control (contraception). Lifestyle  Do not use any products that contain nicotine or tobacco, such as cigarettes, e-cigarettes, and chewing tobacco. If you need help quitting, ask your health care provider.  Do not drink alcohol, especially if you are breastfeeding. Eating and drinking   Drink enough fluid  to keep your urine pale yellow.  Eat high-fiber foods every day. These may help prevent or relieve constipation. High-fiber foods include: ? Whole grain cereals and breads. ? Brown rice. ? Beans. ? Fresh fruits and vegetables.  Take your prenatal vitamins until your postpartum checkup or until your health care provider tells you it is okay to stop. General instructions  Keep all follow-up visits for you and your baby as told by your health care provider. Most women visit their health care provider for a postpartum checkup within the first 3-6 weeks after delivery. Contact a health care provider if you:  Feel unable to cope with the changes that a new baby brings to your life, and these feelings do not go away.  Feel unusually sad or worried.  Have breasts that are painful, hard, or turn red.  Have a fever.  Have trouble holding urine or keeping urine from leaking.  Have little or no interest in activities you used to enjoy.  Have not breastfed at all and you have not had a menstrual period for 12 weeks after delivery.  Have stopped breastfeeding and you have not had a menstrual period for 12 weeks after you stopped breastfeeding.  Have questions about caring for yourself or your baby.  Pass a blood clot from your vagina. Get help right away if you:  Have chest pain.  Have difficulty breathing.  Have sudden, severe leg pain.  Have severe pain or cramping in your  abdomen.  Bleed from your vagina so much that you fill more than one sanitary pad in one hour. Bleeding should not be heavier than your heaviest period.  Develop a severe headache.  Faint.  Have blurred vision or spots in your vision.  Have a bad-smelling vaginal discharge.  Have thoughts about hurting yourself or your baby. If you ever feel like you may hurt yourself or others, or have thoughts about taking your own life, get help right away. You can go to your nearest emergency department or call:  Your local emergency services (911 in the [place]  A suicide crisis helpline, such as the [facility] at 873-110-8691. This is open 24 hours a day. Summary  The period of time from when you deliver your baby to up to 6-12 weeks after delivery is called the postpartum period.  Gradually return to your normal activities as told by your health care provider.  Keep all follow-up visits for you and your baby as told by your health care provider. This information is not intended to replace advice given to you by your health care provider. Make sure you discuss any questions you have with your health care provider. Document Released: 08/08/2000 Document Revised: 03/31/2018 Document Reviewed: 03/31/2018 Elsevier Patient Education  2020 [employer].

## 2019-04-26 NOTE — Progress Notes (Signed)
TELEHEALTH POSTPARTUM VIRTUAL VIDEO VISIT ENCOUNTER NOTE   Provider location: [facility] for [employer] at [facility]   I connected with Tiffany Salazar on 04/26/19 at  8:55 AM EDT by MyChart Video Encounter at home and verified that I am speaking with the correct person using two identifiers.    I discussed the limitations, risks, security and privacy concerns of performing an evaluation and management service virtually and the availability of in person appointments. I also discussed with the patient that there may be a patient responsible charge related to this service. The patient expressed understanding and agreed to proceed.  Chief Complaint: Postpartum Visit  History of Present Illness: Tiffany Salazar is a 38 y.o. Caucasian [id number] being evaluated for postpartum followup.    She is s/p Repeat Cesarean on 03/29/2019 at 39 weeks; she was discharged to home on 04/01/2019. Pregnancy complicated by HepC, substance use, depression, hx of C/S. Baby is doing well yes.  Complains of feeling like she "pulled a muscle on the inside of her C/S" incision about a week ago, but denies any s/sx of infection. Pt reports around the time this occurred, she picked up her daughter who weighs "more than 10 pounds."  Vaginal bleeding or discharge: No Intercourse: No  Contraception: bilateral tubal ligation Mode of feeding infant: Breast PP depression s/s: No .  Any bowel or bladder issues: No  Pap smear: no abnormalities (date: 06/11/2018)  Review of Systems: Positive for pulling sensation behind C/S incision. Her 12 point review of systems is negative or as noted in the History of Present Illness.  Patient Active Problem List   Diagnosis Date Noted  . Hepatitis C, chronic, maternal, antepartum ([place]) 01/05/2019  . History of tricuspid valve replacement with bioprosthetic valve 09/20/2018  . Tobacco use disorder 08/02/2018  . Hepatitis C antibody positive in blood   . Endocarditis of tricuspid  valve 03/20/2018  . Depression 11/13/2016  . Substance use disorder 12/30/2013    Medications Nino Glow. Tigue had no medications administered during this visit. Current Outpatient Medications  Medication Sig Dispense Refill  . albuterol (PROVENTIL HFA;VENTOLIN HFA) 108 (90 Base) MCG/ACT inhaler Inhale 2 puffs into the lungs every 6 (six) hours as needed for wheezing or shortness of breath. (Patient not taking: Reported on 04/05/2019) 1 Inhaler 2  . aspirin EC 81 MG tablet Take 81 mg by mouth daily.    . buprenorphine (SUBUTEX) 8 MG SUBL SL tablet Place 8 mg under the tongue 3 (three) times daily.     Marland Kitchen ibuprofen (ADVIL) 800 MG tablet Take 1 tablet (800 mg total) by mouth 3 (three) times daily with meals as needed for headache, mild pain, moderate pain or cramping. 30 tablet 2  . oxyCODONE (OXY IR/ROXICODONE) 5 MG immediate release tablet Take 1 tablet (5 mg total) by mouth every 4 (four) hours as needed for severe pain or breakthrough pain. 30 tablet 0  . polyethylene glycol (MIRALAX) packet Take 17 g by mouth 2 (two) times daily. May increase to three times a day if no result (Patient not taking: Reported on 04/05/2019) 14 each 5  . Prenatal Vit-Fe Fumarate-FA (PREPLUS) 27-1 MG TABS Take 1 tablet by mouth daily. (Patient not taking: Reported on 04/05/2019) 30 tablet 13  . senna-docusate (SENOKOT-S) 8.6-50 MG tablet Take 2 tablets by mouth at bedtime as needed for mild constipation or moderate constipation. (Patient not taking: Reported on 04/05/2019) 30 tablet 2   No current facility-administered medications for this visit.  Allergies Gentamicin and Vancomycin  Physical Exam:  BP 100/69   Pulse (!) 59  General:  Alert, oriented and cooperative. Patient is in no acute distress.  Mental Status: Normal mood and affect. Normal behavior. Normal judgment and thought content.   Respiratory: Normal respiratory effort noted, no problems with respiration noted  Rest of physical exam deferred due  to type of encounter  PP Depression Screening:   Edinburgh Postnatal Depression Scale Screening Tool 04/26/2019 03/30/2019 03/29/2019  I have been able to laugh and see the funny side of things. 0 0 (No Data)  I have looked forward with enjoyment to things. 0 0 -  I have blamed myself unnecessarily when things went wrong. 0 1 -  I have been anxious or worried for no good reason. 0 0 -  I have felt scared or panicky for no good reason. 0 0 -  Things have been getting on top of me. 0 1 -  I have been so unhappy that I have had difficulty sleeping. 0 0 -  I have felt sad or miserable. 0 0 -  I have been so unhappy that I have been crying. 0 0 -  The thought of harming myself has occurred to me. 0 0 -  Edinburgh Postnatal Depression Scale Total 0 2 -     Assessment:Patient is a 38 y.o. [id number] who is 4 weeks postpartum from a repeat cesarean section and tubal ligation was performed.  She is doing well.   Plan:  1. Postpartum exam -as best as can be seen through video, incision appears well-approximated and healed without evidence of swelling, redness, drainage, bleeding; advised pt she can be seen in [facility]U for an emergency evaluation, or can be scheduled outpatient with a physician -otherwise no concerns, pt appears to be doing well  RTC within one week for evaluation of pulling sensation behind C/S incision.  I discussed the assessment and treatment plan with the patient. The patient was provided an opportunity to ask questions and all were answered. The patient agreed with the plan and demonstrated an understanding of the instructions.   The patient was advised to call back or seek an in-person evaluation/go to the ED for any concerning postpartum symptoms.  I provided 15 minutes of face-to-face time during this encounter.   Marylen PontoNicole E Nugent NP [facility] 04/26/2019 9:42 AM

## 2019-05-11 ENCOUNTER — Encounter: Payer: Self-pay | Admitting: Obstetrics and Gynecology

## 2019-05-11 ENCOUNTER — Ambulatory Visit: Payer: Medicaid Other | Admitting: Obstetrics and Gynecology

## 2019-05-26 ENCOUNTER — Encounter: Payer: Self-pay | Admitting: Family Medicine

## 2019-05-27 ENCOUNTER — Ambulatory Visit: Payer: Medicaid Other | Admitting: Family Medicine

## 2019-06-03 ENCOUNTER — Other Ambulatory Visit: Payer: Self-pay

## 2019-06-03 ENCOUNTER — Encounter: Payer: Self-pay | Admitting: Family Medicine

## 2019-06-03 ENCOUNTER — Telehealth (INDEPENDENT_AMBULATORY_CARE_PROVIDER_SITE_OTHER): Payer: Medicaid Other | Admitting: Family Medicine

## 2019-06-03 DIAGNOSIS — R768 Other specified abnormal immunological findings in serum: Secondary | ICD-10-CM | POA: Diagnosis not present

## 2019-06-03 NOTE — Progress Notes (Signed)
[place] Telemedicine Visit  Patient consented to have virtual visit. Method of visit: Telephone  Encounter participants: Patient: Tiffany Salazar - located at home Provider: Martyn Malay - located at [facility]    Chief Complaint: hepatitis C   HPI: Tiffany Salazar 38 year with history of hepatitis C (detectable viral load), endocarditis s/p tricuspid valve replacement, and bilateral tubal ligation presenting via virtual visit for a referral to ID. She was diagnosed with hepatitis C while admitted for MRSA bacteremia. She has been using buprenorphine for the past year. She would like treatment for Hep C. Partner is also positive and now has insurance. 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   ROS: per HPI  Pertinent PMHx:  Hepatitis C TVR MRSA Bacteremia   Exam:  Respiratory: Breathing comfortably, speaking in full sentences   Assessment/Plan: Diagnoses and all orders for this visit:  Hepatitis C antibody positive in blood- genotype 1A, positive viral load  -     Ambulatory referral to Infectious Disease - Recommended partner call as well  - Recommended influenza vaccination   Time spent during visit with patient:8 minutes  Dorris Singh, MD  Family Medicine Teaching Service

## 2019-06-21 ENCOUNTER — Telehealth: Payer: Self-pay | Admitting: Pharmacy Technician

## 2019-06-21 ENCOUNTER — Other Ambulatory Visit: Payer: Self-pay

## 2019-06-21 ENCOUNTER — Ambulatory Visit (INDEPENDENT_AMBULATORY_CARE_PROVIDER_SITE_OTHER): Payer: Medicaid Other | Admitting: Infectious Diseases

## 2019-06-21 ENCOUNTER — Encounter: Payer: Self-pay | Admitting: Infectious Diseases

## 2019-06-21 VITALS — BP 115/73 | HR 56 | Temp 98.0°F | Wt 208.8 lb

## 2019-06-21 DIAGNOSIS — Z23 Encounter for immunization: Secondary | ICD-10-CM | POA: Diagnosis not present

## 2019-06-21 DIAGNOSIS — O98419 Viral hepatitis complicating pregnancy, unspecified trimester: Secondary | ICD-10-CM

## 2019-06-21 DIAGNOSIS — B182 Chronic viral hepatitis C: Secondary | ICD-10-CM | POA: Diagnosis not present

## 2019-06-21 NOTE — Patient Instructions (Signed)
Use condoms with all sexual intercourse until you know both you and your boyfriend are cured from hepatitis C. Avoid alcohol and illicit drugs. Return to clinic in 3 weeks to review results of your FibroScan and discuss treatment options for hepatitis C.

## 2019-06-21 NOTE — Progress Notes (Signed)
Tiffany Salazar  [id number]  04/29/1981    HPI: The patient is a 38 y.o. y/o white female who presents today for an evaluation for HCV.  She was initially diagnosed in 2018 while receiving care at a local methadone clinic.  The following year she was diagnosed with a tricuspid valve endocarditis due to relapse in her IV drug use and completed treatment in October 2019.  Since that time she has continued to take Subutex three 8 mg tablets daily to control her addictive symptoms.  She admits to the.  Of IV drug use that was ongoing in total for approximately 4 years from 2015-2019.  She initially began her illicit drug use with snorting heroin and then gradually progressed to crushing prescription pills then ultimately using heroin daily as well as cocaine.  She also has had multiple body piercings in the past including to her years, nose, nipples, vaginal area, bellybutton, and eyelid.  She also has had several tattoos the most recent of which were placed to her right neck and posterior neck in October 2018.  She recently gave birth to a child 3 months ago and has started modafinil postpartum under the direction of her physician at her methadone clinic.  Per labs obtained in September 2019 she had a negative HIV antibody at the time and confirmed chronic infection with hepatitis C genotype 1a and HCV viral load of 201,000 IU.  She is anxious to begin treatment for her chronic hepatitis C.  She is without physical complaints at today's visit.  Past Medical History:  Diagnosis Date  . AMA (advanced maternal age) multigravida 35+, unspecified trimester 09/20/2018  . Hepatitis C   . MRSA infection 03/21/2015  . Pregnancy complicated by subutex maintenance, antepartum (HCC) 10/20/2018  . Subdural hematoma (HCC) 12/26/2013  . Substance use disorder   . Tricuspid valve replaced   . Unwanted fertility 02/09/2019   Signed BTL papers    Past Surgical History:  Procedure Laterality Date  . CESAREAN SECTION   x 3  . CESAREAN SECTION WITH BILATERAL TUBAL LIGATION Bilateral 03/29/2019   Procedure: CESAREAN SECTION WITH BILATERAL TUBAL LIGATION;  Surgeon: Allie Bossierove, Myra C, MD;  Location: [facility];  Service: Obstetrics;  Laterality: Bilateral;  . TEE WITHOUT CARDIOVERSION N/A 03/23/2018   Procedure: TRANSESOPHAGEAL ECHOCARDIOGRAM (TEE);  Surgeon: Chrystie NoseHilty, Kenneth C, MD;  [facility];  Service: Cardiovascular;  Laterality: N/A;  . TEE WITHOUT CARDIOVERSION N/A 03/29/2018   Procedure: TRANSESOPHAGEAL ECHOCARDIOGRAM (TEE);  Surgeon: Alleen BorneBartle, Bryan K, MD;  [facility];  Service: Open Heart Surgery;  Laterality: N/A;  . TRICUSPID VALVE REPLACEMENT N/A 03/29/2018   Procedure: TRICUSPID VALVE REPLACEMENT Using 27 mm Magna Ease Mitral Valve Pericardial Bioprosthesis;  Surgeon: Alleen BorneBartle, Bryan K, MD;  [facility];  Service: Open Heart Surgery;  Laterality: N/A;     Family History  Problem Relation Age of Onset  . Thyroid disease Mother   . Breast cancer Mother 454       breast  . Alcohol abuse Father   She has 4 children aging ranging in age from 343 months old to 36 years old.  Per her report all of her children are healthy without medical issues.  Social History   Tobacco Use  . Smoking status: Current Every Day Smoker    Packs/day: 1.00    Types: E-cigarettes  . Smokeless tobacco: Current User  . Tobacco comment: quit smoking cigarretted; vape  Substance Use Topics  . Alcohol use: Not Currently  Alcohol/week: 0.0 standard drinks    Comment: Pt stated she has started goint ot rehab clinic  . Drug use: Not Currently    Types: IV, Cocaine, Heroin, Marijuana    Comment: March 20, 2018 last time used drugs. subutex      reports being sexually active and has had partner(s) who are Female and Female. She reports using the following method of birth control/protection: None.   Allergies  Allergen Reactions  . Gentamicin Rash  . Vancomycin Rash     Outpatient Medications Prior to Visit   Medication Sig Dispense Refill  . modafinil (PROVIGIL) 200 MG tablet Take 200 mg by mouth 2 (two) times daily.    Marland Kitchen albuterol (PROVENTIL HFA;VENTOLIN HFA) 108 (90 Base) MCG/ACT inhaler Inhale 2 puffs into the lungs every 6 (six) hours as needed for wheezing or shortness of breath. (Patient not taking: Reported on 04/05/2019) 1 Inhaler 2  . aspirin EC 81 MG tablet Take 81 mg by mouth daily.    . buprenorphine (SUBUTEX) 8 MG SUBL SL tablet Place 8 mg under the tongue 3 (three) times daily.     Marland Kitchen ibuprofen (ADVIL) 800 MG tablet Take 1 tablet (800 mg total) by mouth 3 (three) times daily with meals as needed for headache, mild pain, moderate pain or cramping. 30 tablet 2  . oxyCODONE (OXY IR/ROXICODONE) 5 MG immediate release tablet Take 1 tablet (5 mg total) by mouth every 4 (four) hours as needed for severe pain or breakthrough pain. 30 tablet 0  . polyethylene glycol (MIRALAX) packet Take 17 g by mouth 2 (two) times daily. May increase to three times a day if no result (Patient not taking: Reported on 04/05/2019) 14 each 5  . Prenatal Vit-Fe Fumarate-FA (PREPLUS) 27-1 MG TABS Take 1 tablet by mouth daily. (Patient not taking: Reported on 04/05/2019) 30 tablet 13  . senna-docusate (SENOKOT-S) 8.6-50 MG tablet Take 2 tablets by mouth at bedtime as needed for mild constipation or moderate constipation. (Patient not taking: Reported on 04/05/2019) 30 tablet 2   No facility-administered medications prior to visit.      Review of Systems  Constitutional: Positive for fatigue. Negative for chills and fever.  HENT: Negative for congestion, hearing loss and sinus pressure.   Eyes: Negative for photophobia, discharge, redness and visual disturbance.  Respiratory: Negative for apnea, cough, shortness of breath and wheezing.   Cardiovascular: Negative for chest pain and leg swelling.  Gastrointestinal: Negative for abdominal distention, abdominal pain, constipation, diarrhea, nausea and vomiting.  Endocrine:  Negative for cold intolerance, heat intolerance, polydipsia and polyuria.  Genitourinary: Negative for dysuria, flank pain, frequency, urgency, vaginal bleeding and vaginal discharge.  Musculoskeletal: Negative for arthralgias, back pain, joint swelling and neck pain.  Skin: Negative for pallor and rash.  Allergic/Immunologic: Negative for immunocompromised state.  Neurological: Negative for dizziness, seizures, speech difficulty, weakness and headaches.  Hematological: Does not bruise/bleed easily.  Psychiatric/Behavioral: Negative for agitation, confusion, hallucinations and sleep disturbance. The patient is nervous/anxious.      Vitals:   06/21/19 1504  BP: 115/73  Pulse: (!) 56  Temp: 98 F (36.7 C)     Physical Exam Gen: pleasant, anxious, NAD, A&Ox 3 Head: NCAT, no temporal wasting evident EENT: PERRL, EOMI, MMM, adequate dentition Neck: supple, no JVD CV: bradycardic, RR, no murmurs evident Pulm: CTA bilaterally, no wheeze or retractions Abd: soft, NTND, +BS Extrems: trace LE edema, 2+ pulses Skin: no rashes, adequate skin turgor Neuro: CN II-XII grossly intact, no focal neurologic  deficits appreciated, gait was normal, A&Ox 3   Labs: Lab Results  Component Value Date   HEPBSAG Negative 08/04/2018    No results found for: [facility]  No results found for: [facility]  Lab Results  Component Value Date   HCVGENOTYPE Comment 02/09/2019    Lab Results  Component Value Date   WBC 18.1 (H) 03/30/2019   HGB 10.5 (L) 03/30/2019   HCT 32.8 (L) 03/30/2019   MCV 96.8 03/30/2019   PLT 129 (L) 03/30/2019       Chemistry      Component Value Date/Time   NA 138 03/14/2019 1447   NA 141 12/26/2013 0416   K 4.2 03/14/2019 1447   K 4.0 12/26/2013 0416   CL 104 03/14/2019 1447   CL 108 (H) 12/26/2013 0416   CO2 18 (L) 03/14/2019 1447   CO2 28 12/26/2013 0416   BUN 8 03/14/2019 1447   BUN 16 12/26/2013 0416   CREATININE 0.73 03/30/2019 0615   CREATININE 1.08  12/26/2013 0416      Component Value Date/Time   CALCIUM 8.6 (L) 03/14/2019 1447   CALCIUM 8.6 12/26/2013 0416   ALKPHOS 184 (H) 03/14/2019 1447   ALKPHOS 74 12/26/2013 0416   AST 22 03/14/2019 1447   AST 39 (H) 12/26/2013 0416   ALT 24 03/14/2019 1447   ALT LAVTOP 04/06/2018 1800   BILITOT 0.3 03/14/2019 1447   BILITOT 0.2 12/26/2013 0416        Assessment/Plan: The patient is a 38 year old white female with a history of IV drug use and tricuspid valve endocarditis status post tricuspid valve replacement in 2019 presenting for evaluation for chronic hepatitis C.  HCV - Her HCV Ab was initially positive in 2018, thus confirming past exposure to pathogen. Note: this screening test will remain positive/reactive life-long even if HCV infection has been cured/immunologically cleared. Her most likely risk factor for acquisition was past IV drug use, intranasal cocaine use, and less likely tattoo placements or body piercings.  She has confirmed chronic infection with genotype 1a and HCV viral load of 201,000 IU. An HIV Ab last year was negative, thus excluding co-infection.  I will check a hepatitis B surface antigen to assess her risk for the development of breakthrough hepatitis B viremia while on HCV treatment and will also check a hepatitis B surface antibody to determine prior success of her previous vaccination.  As chronic infection has been confirmed, will proceed with FibroScan testing to determine stage of fibrosis and best directly active antiviral treatment options. F/u in 4 weeks to review lab results.   Health maintenance - I have counselled the patient extensively re: the need for barrier precautions with sexual activity in order to prevent sexual and/or vertical transmission of HCV.  She underwent a bilateral tubal ligation following her most recent delivery.  She must continue these practices until 3 months after treatment completion until a sustained virologic response (SVR) has been  confirmed to establish a cure of her infection. She expressed full understanding of these instructions. Vaccination for hepatitis A & B was recommended as was abstinence from cigarettes and alcohol.

## 2019-06-21 NOTE — Telephone Encounter (Signed)
[facility] Patient Teacher, English as a foreign language completed.    The patient is insured through [name] and has a $3 copay.  We will need to get a Readiness to Treat evaluation form.  Tiffany Salazar. [name] [place] Patient [facility] Phone: [phone] Fax:  [phone]

## 2019-06-22 LAB — HEPATITIS B SURFACE ANTIBODY, QUANTITATIVE: Hep B S AB Quant (Post): 62 m[IU]/mL (ref >=10)

## 2019-06-22 LAB — HEPATITIS B SURFACE ANTIGEN: Hepatitis B Surface Ag: NONREACTIVE

## 2019-06-29 ENCOUNTER — Ambulatory Visit
Admission: RE | Admit: 2019-06-29 | Discharge: 2019-06-29 | Disposition: A | Discharge status: Home / Self Care | Payer: Medicaid Other | Source: Ambulatory Visit | Attending: Infectious Diseases | Admitting: Infectious Diseases

## 2019-07-01 ENCOUNTER — Telehealth: Payer: Self-pay | Admitting: *Deleted

## 2019-07-01 NOTE — Telephone Encounter (Signed)
   [facility] Pre-operative Risk Assessment    Request for surgical clearance:  1. What type of surgery is being performed? Root canal therapy and possible extraction   2. When is this surgery scheduled? 07/07/19   3. What type of clearance is required (medical clearance vs. Pharmacy clearance to hold med vs. Both)? medical  4. Are there any medications that need to be held prior to surgery and how long?none   5. Practice name and name of physician performing surgery? [facility]   6. What is your office phone number [phone]    7.   What is your office fax number [phone]  8.   Anesthesia type (None, local, MAC, general) ? 2% lidocaine with 1:lock epi   Fredia Beets 07/01/2019, 3:11 PM  _________________________________________________________________   (provider comments below)

## 2019-07-01 NOTE — Telephone Encounter (Signed)
   Primary [place], MD  Chart reviewed as part of pre-operative protocol coverage. Because of Tiffany Salazar's past medical history and time since last visit, he/she will require a follow-up visit in order to better assess preoperative cardiovascular risk.  She will require SBE prophylaxis prior to her procedure.    Pre-op covering staff: - Please schedule appointment and call patient to inform them. - Please contact requesting surgeon's office via preferred method (i.e, phone, fax) to inform them of need for appointment prior to surgery.  If applicable, this message will also be routed to pharmacy pool and/or primary cardiologist for input on holding anticoagulant/antiplatelet agent as requested below so that this information is available at time of patient's appointment.   [name], NP  07/01/2019, 3:38 PM

## 2019-07-01 NOTE — Telephone Encounter (Signed)
Pt has been informed of needing appt for surgery clearance. Pt has been scheduled for Dr. Oval Linsey 07/05/19 @ 11:40 am. I will route to Dr. Oval Linsey for appt and remove from the pre op call back pool.

## 2019-07-03 ENCOUNTER — Telehealth: Payer: Self-pay | Admitting: Family Medicine

## 2019-07-03 NOTE — Telephone Encounter (Signed)
Received letter from Dr. Graciella Freer regarding dental surgery. It appears patient scheduled with Cardiology for pre-operative evaluation given valvular heart disease, which is appropriate.   Nursing- form is in the nursing forms wall pocket. Please fax to [facility], appointment is with Dr. Oval Linsey.   Let me know if dentist needs additional documentation from me.   Thank you, [place] Coach   Dorris Singh, MD  [facility] Medicine Teaching Service

## 2019-07-04 ENCOUNTER — Other Ambulatory Visit: Payer: Self-pay

## 2019-07-04 ENCOUNTER — Telehealth: Payer: Self-pay | Admitting: *Deleted

## 2019-07-04 ENCOUNTER — Encounter: Payer: Self-pay | Admitting: *Deleted

## 2019-07-04 ENCOUNTER — Encounter: Payer: Self-pay | Admitting: Cardiovascular Disease

## 2019-07-04 ENCOUNTER — Ambulatory Visit (INDEPENDENT_AMBULATORY_CARE_PROVIDER_SITE_OTHER): Payer: Medicaid Other | Admitting: Cardiovascular Disease

## 2019-07-04 VITALS — BP 96/50 | HR 54 | Temp 95.9°F | Ht 68.0 in | Wt 211.0 lb

## 2019-07-04 DIAGNOSIS — Z954 Presence of other heart-valve replacement: Secondary | ICD-10-CM

## 2019-07-04 DIAGNOSIS — I079 Rheumatic tricuspid valve disease, unspecified: Secondary | ICD-10-CM | POA: Diagnosis not present

## 2019-07-04 NOTE — Patient Instructions (Addendum)
Medication Instructions:  Your physician recommends that you continue on your current medications as directed. Please refer to the Current Medication list given to you today.  *If you need a refill on your cardiac medications before your next appointment, please call your pharmacy*  Lab Work: NONE   Testing/Procedures: NONE  Follow-Up: At [facility], you and your health needs are our priority.  As part of our continuing mission to provide you with exceptional heart care, we have created designated Provider Care Teams.  These Care Teams include your primary Cardiologist (physician) and Advanced Practice Providers (APPs -  Physician Assistants and Nurse Practitioners) who all work together to provide you with the care you need, when you need it.  Your next appointment:   12 months  You will receive a reminder letter in the mail two months in advance. If you don't receive a letter, please call our office to schedule the follow-up appointment.  The format for your next appointment:   In Person  Provider:   You may see Skeet Latch, MD or one of the following Advanced Practice Providers on your designated Care Team:    Kerin Ransom, PA-C  [place], [place]  Coletta Memos, [place]   Other Instructions YOU ARE CLEARED FROM [place]

## 2019-07-04 NOTE — Progress Notes (Signed)
Cardiology Office Note   Date:  07/04/2019   ID:  Tiffany Salazar, DOB 08-Oct-1980, MRN [id number]  PCP:  Westley Chandler, MD  Cardiologist:   Chilton Si, MD   Chief Complaint  Patient presents with  . Follow-up    Surgical clearance.     History of Present Illness: Tiffany Salazar is a 38 y.o. 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  Tiffany Salazar was admitted 02/2018 with tricuspid valve endocarditis.  She had a TEE that showed a 0.5 cm x 1 cm vegetation on the septal leaflet of the tricuspid valve.  There was severe tricuspid regurgitation.   She was initially treated at an outside hospital and started on vancomycin and gentamicin.  However she developed Red Man syndrome.  This was switched to daptomycin and Bactrim.  Her hospitalization was complicated by septic pulmonary emboli.  She underwent tricuspid valve replacement with a 27 mm Edwards Magna-Ease pericardial valve on 03/29/2018.  Her postoperative course was relatively uncomplicated.  Tiffany Salazar followed up 04/2018 and was doing well from a cardiac standpoint.  She had a phone visit with [name], PA on 09/2018. She noted some exertional dyspnea which was attributed to pregnancy.  She had a filling without complications.  She has been doing well.  She had a daughter and has been very stressed.  She has a high Education administrator, second grader and newborn.  The older children are doing virtual school.  She has been trying to go to the gym but does not always have time.  When she does exercise she has no exertional chest pain or shortness of breath.  Last week she had an episode of lightheadedness after standing.  It lasted for a minute.  She denies any syncope.  She denies any palpitations.  She has not noted any lower extremity edema, orthopnea, or PND.  She is no longer breast-feeding.  She does note that her soda intake has been high.  She drinks a cup of  coffee in the morning and then has 4 sodas throughout the day.  She continues to abstain from illicit drug use.  She is currently in school to become a Scientist, forensic.   Past Medical History:  Diagnosis Date  . AMA (advanced maternal age) multigravida 37+, unspecified trimester 09/20/2018  . Hepatitis C   . MRSA infection 03/21/2015  . Pregnancy complicated by subutex maintenance, antepartum (HCC) 10/20/2018  . Subdural hematoma (HCC) 12/26/2013  . Substance use disorder   . Tricuspid valve replaced   . Unwanted fertility 02/09/2019   Signed BTL papers    Past Surgical History:  Procedure Laterality Date  . CESAREAN SECTION  x 3  . CESAREAN SECTION WITH BILATERAL TUBAL LIGATION Bilateral 03/29/2019   Procedure: CESAREAN SECTION WITH BILATERAL TUBAL LIGATION;  Surgeon: Allie Bossier, MD;  Location: [facility];  Service: Obstetrics;  Laterality: Bilateral;  . TEE WITHOUT CARDIOVERSION N/A 03/23/2018   Procedure: TRANSESOPHAGEAL ECHOCARDIOGRAM (TEE);  Surgeon: Chrystie Nose, MD;  Location: [facility] ENDOSCOPY;  Service: Cardiovascular;  Laterality: N/A;  . TEE WITHOUT CARDIOVERSION N/A 03/29/2018   Procedure: TRANSESOPHAGEAL ECHOCARDIOGRAM (TEE);  Surgeon: Alleen Borne, MD;  Location: [facility] OR;  Service: Open Heart Surgery;  Laterality: N/A;  . TRICUSPID VALVE REPLACEMENT N/A 03/29/2018   Procedure: TRICUSPID VALVE REPLACEMENT Using 27 mm Magna Ease Mitral Valve Pericardial Bioprosthesis;  Surgeon: Alleen Borne, MD;  Location: [facility]  OR;  Service: Open Heart Surgery;  Laterality: N/A;     Current Outpatient Medications  Medication Sig Dispense Refill  . albuterol (PROVENTIL HFA;VENTOLIN HFA) 108 (90 Base) MCG/ACT inhaler Inhale 2 puffs into the lungs every 6 (six) hours as needed for wheezing or shortness of breath. 1 Inhaler 2  . Brexpiprazole (REXULTI) 4 MG TABS Take 4 mg by mouth daily.    . buprenorphine (SUBUTEX) 8 MG SUBL SL tablet Place 8 mg under the tongue 3 (three) times daily.     . citalopram  (CELEXA) 20 MG tablet Take 20 mg by mouth daily.    Marland Kitchen ibuprofen (ADVIL) 800 MG tablet Take 1 tablet (800 mg total) by mouth 3 (three) times daily with meals as needed for headache, mild pain, moderate pain or cramping. 30 tablet 2  . modafinil (PROVIGIL) 200 MG tablet Take 200 mg by mouth 2 (two) times daily.    . polyethylene glycol (MIRALAX) packet Take 17 g by mouth 2 (two) times daily. May increase to three times a day if no result 14 each 5  . Prenatal Vit-Fe Fumarate-FA (PREPLUS) 27-1 MG TABS Take 1 tablet by mouth daily. 30 tablet 13  . senna-docusate (SENOKOT-S) 8.6-50 MG tablet Take 2 tablets by mouth at bedtime as needed for mild constipation or moderate constipation. 30 tablet 2  . aspirin EC 81 MG tablet Take 81 mg by mouth daily.     No current facility-administered medications for this visit.     Allergies:   Gentamicin and Vancomycin    Social History:  The patient  reports that she has been smoking e-cigarettes. She has been smoking about 1.00 pack per day. She uses smokeless tobacco. She reports previous alcohol use. She reports previous drug use. Drugs: IV, Cocaine, Heroin, and Marijuana.   Family History:  The patient's family history includes Alcohol abuse in her father; Breast cancer (age of onset: 67) in her mother; Thyroid disease in her mother.    ROS:  Please see the history of present illness.   Otherwise, review of systems are positive for none.   All other systems are reviewed and negative.    PHYSICAL EXAM: VS:  BP (!) 96/50 (BP Location: Left Arm, Patient Position: Sitting, Cuff Size: Normal)   Pulse (!) 54   Temp (!) 95.9 F (35.5 C)   Ht 5\' 8"  (1.727 m)   Wt 211 lb (95.7 kg)   BMI 32.08 kg/m  , BMI Body mass index is 32.08 kg/m. GENERAL:  Well appearing HEENT:  Pupils equal round and reactive, fundi not visualized, oral mucosa unremarkable NECK:  No jugular venous distention, waveform within normal limits, carotid upstroke brisk and symmetric, no  bruits, no thyromegaly LYMPHATICS:  No cervical adenopathy LUNGS:  Clear to auscultation bilaterally HEART:  RRR.  PMI not displaced or sustained,S1 and S2 within normal limits, no S3, no S4, no clicks, no rubs, no murmurs ABD:  Flat, positive bowel sounds normal in frequency in pitch, no bruits, no rebound, no guarding, no midline pulsatile mass, no hepatomegaly, no splenomegaly EXT:  2 plus pulses throughout, no edema, no cyanosis no clubbing SKIN:  No rashes no nodules NEURO:  Cranial nerves II through XII grossly intact, motor grossly intact throughout PSYCH:  Cognitively intact, oriented to person place and time   EKG:  EKG is ordered today. 07/14/19: Sinus bradycardia.  Rate 54 bpm.     TEE 03/23/18: Study Conclusions  - Left ventricle: The cavity size was normal. Wall  thickness was   normal. Systolic function was normal. The estimated ejection   fraction was in the range of 55% to 60%. Wall motion was normal;   there were no regional wall motion abnormalities. - Aortic valve: No evidence of vegetation. - Mitral valve: No evidence of vegetation. - Left atrium: No evidence of thrombus in the atrial cavity or   appendage. - Right atrium: No evidence of thrombus in the atrial cavity or   appendage. - Atrial septum: No defect or patent foramen ovale was identified. - Tricuspid valve: Large 0.5 x 1 cm vegetations of the septal and   posterior leafelts (seen in 2D and 3D views) with associated   severe, wide-open TR. Peak RV-RA gradient (S): 88 mm Hg.   Regurgitant VTI: 88 cm. - Pulmonic valve: No evidence of vegetation.  Impressions:  - 2 large mobile vegetations of the TV with severe TR and high   embolic potential. Consider CT surgical consult for debridement   and TV replacement.  ECHO:03/10/2018: Summary:  -Clinical question: Sepsis  -Left ventricle: Size was normal. Systolic function was normal. Ejection  fraction was estimated in the range of 55 % to 60 %.  There were no regional  wall motion abnormalities. Left ventricular diastolic function parameters were normal.  -Mitral valve: There was no evidence for vegetation.  -Right ventricle: The size was normal. Systolic function was normal.  Systolic pressure was within the normal range. Estimated peak pressure was at  least 24 mmHg. There was a mobile mass in the ventricular cavity. It may  represent a vegetation.  -Tricuspid valve: There was trace regurgitation. There was echogenic mobile  mass on tricuspid valve leaflets and also in the right ventricular chamber  consistent with vegetations .  Pulmonary veins: Not well visualized.  Right ventricle: The size was normal. Systolic function was normal. Wall  thickness was normal. There was a mobile mass in the ventricular cavity. It  may represent a vegetation. Doppler: Systolic pressure was within the normal  range. Estimated peak pressure was at least 24 mmHg.  Pulmonic valve: Not well visualized.  Tricuspid valve: The valve structure was normal. There was normal leaflet  separation. There was echogenic mobile mass on tricuspid valve leaflets and  also in the right ventricular chamber consistent with vegetations . Doppler:  There was no evidence for tricuspid stenosis. There was trace regurgitation.  Right atrium: Size was normal.  Systemic veins: IVC: The inferior vena cava was normal in size and course.  Respirophasic changes were normal.  Echo 04/2018: Echo 05/24/2018 LV EF: 55% - 60% Study Conclusions  - Left ventricle: The cavity size was normal. Systolic function was normal. The estimated ejection fraction was in the range of 55% to 60%. Wall motion was normal; there were no regional wall motion abnormalities. - Aortic valve: There was no significant regurgitation. - Mitral valve: There was trivial regurgitation. - Left atrium: The atrium was moderately dilated. - Right ventricle: Systolic function was  normal. - Right atrium: The atrium was moderately to severely dilated. - Atrial septum: No defect or patent foramen ovale was identified. - Tricuspid valve: Prior procedures included surgical repair. A 2.7 cmbioprosthesis was present. There was trivial regurgitation. - Pulmonic valve: There was trivial regurgitation. - Pulmonary arteries: PA peak pressure: 28 mm Hg (S).  Impressions:  - S/P TV replacement. Valve appears well seated, trivial TR centrally. EF normal. No other significant valve disease.   Recent Labs: 03/14/2019: ALT 24; BUN 8; Potassium 4.2; Sodium  138 03/30/2019: Creatinine, Ser 0.73; Hemoglobin 10.5; Platelets 129    Lipid Panel No results found for: CHOL, TRIG, HDL, CHOLHDL, VLDL, LDLCALC, LDLDIRECT    Wt Readings from Last 3 Encounters:  07/04/19 211 lb (95.7 kg)  06/21/19 208 lb 12.8 oz (94.7 kg)  04/05/19 217 lb 1.6 oz (98.5 kg)      ASSESSMENT AND PLAN:  # Tricuspid valve MRSA endocarditis: Ms. Courtney ParisGodwin is doing well physically.  She has no lower extremity edema, orthopnea or PND.  Her tricuspid valve was stable on echo last year.  She does not need presurgical clearance prior to dental procedures.  Unless she is having moderate sedation or general anesthesia this is unnecessary.  She does need antibiotic prophylaxis prior to surgery.  # Polysubstance abuse:  Ms. Courtney ParisGodwin was congratulated on continuing to abstain from illicits.     Current medicines are reviewed at length with the patient today.  The patient does not have concerns regarding medicines.  The following changes have been made:  no change  Labs/ tests ordered today include:   No orders of the defined types were placed in this encounter.    Disposition:   FU with Damyia Strider C. Duke Salviaandolph, MD, [facility] in 1 year.   Signed, Mirela Parsley C. Duke Salviaandolph, MD, [facility]  07/04/2019 4:58 PM     [facility]

## 2019-07-04 NOTE — Telephone Encounter (Signed)
Patient been seen today  

## 2019-07-04 NOTE — Telephone Encounter (Signed)
Patient scheduled for in office visit tomorrow which is a virtual clinic. Dr Oval Linsey has had a cancellation for today at 4 pm. Left message to call back to see about moving to today

## 2019-07-05 ENCOUNTER — Ambulatory Visit: Payer: Medicaid Other | Admitting: Cardiovascular Disease

## 2019-07-05 NOTE — Telephone Encounter (Signed)
Dr. Owens Shark,  We are unable to locate this form.  Any Ideas? Christen Bame, CMA

## 2019-07-06 ENCOUNTER — Telehealth: Payer: Self-pay | Admitting: Cardiovascular Disease

## 2019-07-06 ENCOUNTER — Encounter: Payer: Self-pay | Admitting: Family Medicine

## 2019-07-06 NOTE — Telephone Encounter (Signed)
Spoke with office and advised premed necessary, patient aware and she is cleared for procedure Patient seen on 11/9

## 2019-07-06 NOTE — Telephone Encounter (Signed)
Left message to call back  

## 2019-07-06 NOTE — Telephone Encounter (Signed)
Letter from Dr. Graciella Freer, letter from Dr. Owens Shark and note from Dr. Owens Shark faxed to Dr. Oval Linsey @ [phone].  Copy of Dr. Cherly Anderson letter placed in the to be scanned pile.  Christen Bame, CMA

## 2019-07-06 NOTE — Telephone Encounter (Signed)
Does patient need to be pre-med for her Root canal therapy and possible extraction tomorrow on 07/07/19? She states that they did receive the surgical clearance after her appt on 07/05/19 with Dr. Oval Linsey and that she would also be sending over a fax as well.

## 2019-07-12 ENCOUNTER — Other Ambulatory Visit: Payer: Self-pay

## 2019-07-12 ENCOUNTER — Encounter: Payer: Self-pay | Admitting: Infectious Diseases

## 2019-07-12 ENCOUNTER — Ambulatory Visit (INDEPENDENT_AMBULATORY_CARE_PROVIDER_SITE_OTHER): Payer: Medicaid Other | Admitting: Infectious Diseases

## 2019-07-12 VITALS — BP 103/65 | HR 56 | Wt 213.0 lb

## 2019-07-12 DIAGNOSIS — B182 Chronic viral hepatitis C: Secondary | ICD-10-CM

## 2019-07-12 DIAGNOSIS — O98419 Viral hepatitis complicating pregnancy, unspecified trimester: Secondary | ICD-10-CM | POA: Diagnosis not present

## 2019-07-12 MED ORDER — LEDIPASVIR-SOFOSBUVIR 90-400 MG PO TABS: 1.0000 | ORAL_TABLET | Freq: Every day | ORAL | 1 refills | Status: DC

## 2019-07-12 NOTE — Progress Notes (Signed)
Tiffany Salazar  [id number]  [birth year]    HPI: The patient is a 38 y.o. y/o white female who presents today for an evaluation for HCV.  She was last seen in our clinic on June 21, 2019.  She was initially diagnosed in 2018 while receiving care at a local methadone clinic.  The following year she was diagnosed with a tricuspid valve endocarditis due to relapse in her IV drug use and completed ABX treatment in October 2019.  Since that time, she has continued to take Subutex three 8 mg tablets daily to control her addictive symptoms.  She admits to IV drug use that was ongoing in total for approximately 4 years from 2015-2019.  She initially began her illicit drug use with snorting heroin and then gradually progressed to crushing prescription pills then ultimately using heroin daily as well as cocaine.  She also has had multiple body piercings in the past including to her ears, nose, nipples, vaginal area, bellybutton, and eyelid.  She also has had several tattoos, the most recent of which were placed to her right neck and posterior neck in October 2018.  She recently gave birth to a child earlier this year and has started modafinil postpartum under the direction of her physician at her methadone clinic.  Per labs obtained in September 2019 she had a negative HIV antibody at the time and confirmed chronic infection with hepatitis C genotype 1a and HCV viral load of 201,000 IU.  Last month, her hepatitis B surface antigen was negative and her hepatitis B surface antibody was consistent with prior vaccination/immunity.  Her FibroScan performed on June 29, 2019 confirmed F0-F1 fibrosis. She remains anxious to begin treatment for her chronic hepatitis C.  She is without physical complaints at today's visit.  Past Medical History:  Diagnosis Date  . AMA (advanced maternal age) multigravida 82+, unspecified trimester 09/20/2018  . Hepatitis C   . MRSA infection 03/21/2015  . Pregnancy complicated by  subutex maintenance, antepartum ([place]) 10/20/2018  . Subdural hematoma ([place]) 12/26/2013  . Substance use disorder   . Tricuspid valve replaced   . Unwanted fertility 02/09/2019   Signed BTL papers    Past Surgical History:  Procedure Laterality Date  . CESAREAN SECTION  x 3  . CESAREAN SECTION WITH BILATERAL TUBAL LIGATION Bilateral 03/29/2019   Procedure: CESAREAN SECTION WITH BILATERAL TUBAL LIGATION;  Surgeon: Emily Filbert, MD;  Location: [facility] LD ORS;  Service: Obstetrics;  Laterality: Bilateral;  . TEE WITHOUT CARDIOVERSION N/A 03/23/2018   Procedure: TRANSESOPHAGEAL ECHOCARDIOGRAM (TEE);  Surgeon: Pixie Casino, MD;  Location: [place];  Service: Cardiovascular;  Laterality: N/A;  . TEE WITHOUT CARDIOVERSION N/A 03/29/2018   Procedure: TRANSESOPHAGEAL ECHOCARDIOGRAM (TEE);  Surgeon: Gaye Pollack, MD;  Location: [place];  Service: Open Heart Surgery;  Laterality: N/A;  . TRICUSPID VALVE REPLACEMENT N/A 03/29/2018   Procedure: TRICUSPID VALVE REPLACEMENT Using 27 mm Magna Ease Mitral Valve Pericardial Bioprosthesis;  Surgeon: Gaye Pollack, MD;  Location: [place];  Service: Open Heart Surgery;  Laterality: N/A;     Family History  Problem Relation Age of Onset  . Thyroid disease Mother   . Breast cancer Mother 9       breast  . Alcohol abuse Father   She has 4 children aging ranging in age from 48 months old to 36 years old.  Per her report all of her children are healthy without medical issues.  Social History   Tobacco Use  .  Smoking status: Current Every Day Smoker    Packs/day: 1.00    Types: E-cigarettes  . Smokeless tobacco: Current User  . Tobacco comment: quit smoking cigarretted; vape  Substance Use Topics  . Alcohol use: Not Currently    Alcohol/week: 0.0 standard drinks    Comment: Pt stated she has started goint ot rehab clinic  . Drug use: Not Currently    Types: IV, Cocaine, Heroin, Marijuana    Comment: March 20, 2018 last time used drugs. subutex      reports  previously being sexually active and has had partner(s) who are Female and Female. She reports using the following method of birth control/protection: None.   Allergies  Allergen Reactions  . Gentamicin Rash  . Vancomycin Rash     Outpatient Medications Prior to Visit  Medication Sig Dispense Refill  . Brexpiprazole (REXULTI) 4 MG TABS Take 4 mg by mouth daily.    . buprenorphine (SUBUTEX) 8 MG SUBL SL tablet Place 8 mg under the tongue 3 (three) times daily.     . citalopram (CELEXA) 20 MG tablet Take 20 mg by mouth daily.    . modafinil (PROVIGIL) 200 MG tablet Take 200 mg by mouth 2 (two) times daily.    Marland Kitchen albuterol (PROVENTIL HFA;VENTOLIN HFA) 108 (90 Base) MCG/ACT inhaler Inhale 2 puffs into the lungs every 6 (six) hours as needed for wheezing or shortness of breath. 1 Inhaler 2  . amoxicillin (AMOXIL) 500 MG tablet Take 500 mg by mouth as directed. Take 4 tablets one hour prior to dental procedure    . aspirin EC 81 MG tablet Take 81 mg by mouth daily.    Marland Kitchen ibuprofen (ADVIL) 800 MG tablet Take 1 tablet (800 mg total) by mouth 3 (three) times daily with meals as needed for headache, mild pain, moderate pain or cramping. 30 tablet 2  . polyethylene glycol (MIRALAX) packet Take 17 g by mouth 2 (two) times daily. May increase to three times a day if no result 14 each 5  . Prenatal Vit-Fe Fumarate-FA (PREPLUS) 27-1 MG TABS Take 1 tablet by mouth daily. 30 tablet 13  . senna-docusate (SENOKOT-S) 8.6-50 MG tablet Take 2 tablets by mouth at bedtime as needed for mild constipation or moderate constipation. 30 tablet 2   No facility-administered medications prior to visit.      Review of Systems  Constitutional: Positive for fatigue. Negative for chills and fever.  HENT: Negative for congestion, hearing loss and sinus pressure.   Eyes: Negative for photophobia, discharge, redness and visual disturbance.  Respiratory: Negative for apnea, cough, shortness of breath and wheezing.    Cardiovascular: Negative for chest pain and leg swelling.  Gastrointestinal: Negative for abdominal distention, abdominal pain, constipation, diarrhea, nausea and vomiting.  Endocrine: Negative for cold intolerance, heat intolerance, polydipsia and polyuria.  Genitourinary: Negative for dysuria, flank pain, frequency, urgency, vaginal bleeding and vaginal discharge.  Musculoskeletal: Negative for arthralgias, back pain, joint swelling and neck pain.  Skin: Negative for pallor and rash.  Allergic/Immunologic: Negative for immunocompromised state.  Neurological: Negative for dizziness, seizures, speech difficulty, weakness and headaches.  Hematological: Does not bruise/bleed easily.  Psychiatric/Behavioral: Negative for agitation, confusion, hallucinations and sleep disturbance. The patient is nervous/anxious.      Vitals:   07/12/19 1553  BP: 103/65  Pulse: (!) 56     Physical Exam Gen: pleasant, anxious, NAD, A&Ox 3 Head: NCAT, no temporal wasting evident EENT: PERRL, EOMI, MMM, adequate dentition Neck: supple, no JVD CV:  bradycardic rate, RR, no murmurs evident Pulm: CTA bilaterally, no wheeze or retractions Abd: soft, NTND, +BS Extrems: trace LE edema, 2+ pulses Skin: no rashes, +tattoos to neck, adequate skin turgor Neuro: CN II-XII grossly intact, no focal neurologic deficits appreciated, gait was normal, A&Ox 3   Labs: Lab Results  Component Value Date   HEPBSAG NON-REACTIVE 06/21/2019    No results found for: [facility]  No results found for: [facility]  Lab Results  Component Value Date   HCVGENOTYPE Comment 02/09/2019    Lab Results  Component Value Date   WBC 18.1 (H) 03/30/2019   HGB 10.5 (L) 03/30/2019   HCT 32.8 (L) 03/30/2019   MCV 96.8 03/30/2019   PLT 129 (L) 03/30/2019       Chemistry      Component Value Date/Time   NA 138 03/14/2019 1447   NA 141 12/26/2013 0416   K 4.2 03/14/2019 1447   K 4.0 12/26/2013 0416   CL 104 03/14/2019 1447    CL 108 (H) 12/26/2013 0416   CO2 18 (L) 03/14/2019 1447   CO2 28 12/26/2013 0416   BUN 8 03/14/2019 1447   BUN 16 12/26/2013 0416   CREATININE 0.73 03/30/2019 0615   CREATININE 1.08 12/26/2013 0416      Component Value Date/Time   CALCIUM 8.6 (L) 03/14/2019 1447   CALCIUM 8.6 12/26/2013 0416   ALKPHOS 184 (H) 03/14/2019 1447   ALKPHOS 74 12/26/2013 0416   AST 22 03/14/2019 1447   AST 39 (H) 12/26/2013 0416   ALT 24 03/14/2019 1447   ALT LAVTOP 04/06/2018 1800   BILITOT 0.3 03/14/2019 1447   BILITOT 0.2 12/26/2013 0416        Assessment/Plan: The patient is a 38 year old white female with a history of IV drug use and tricuspid valve endocarditis, status post tricuspid valve replacement in 2019 presenting for evaluation for chronic hepatitis C.  HCV - Her HCV Ab was initially positive in 2018, thus confirming past exposure to pathogen. Note: this screening test will remain positive/reactive life-long even if HCV infection has been cured/immunologically cleared. Her most likely risk factor for acquisition was past IV drug use, intranasal cocaine use, and less likely tattoo placements or body piercings.  She has confirmed chronic infection with genotype 1a and HCV viral load of 201,000 IU. An HIV Ab last year was negative, thus excluding co-infection.  Her hepatitis B surface antigen was negative last month.  Her FibroScan performed on June 29, 2019 confirmed F0-F1 fibrosis.  Several DAA treatment options were reviewed with the patient and she has elected to begin treatment with Harvoni for an anticipated 8-week course.  Patient was reminded to avoid PPIs and H2 blockers.  Our pharmacy staff in the clinic met with the patient at today's visit to review drug-drug interactions and side effects of her anticipated medication.  The patient will have her HCV viral load checked 4 weeks after initiating DAA treatment and will follow up with me in the clinic in 6 weeks' time to review results and  assess her tolerance of medication.  Health maintenance - I have counselled the patient extensively re: the need for barrier precautions with sexual activity in order to prevent sexual and/or vertical transmission of HCV.  She underwent a bilateral tubal ligation following her most recent delivery.  She must continue these practices until 3 months after treatment completion until a sustained virologic response (SVR) has been confirmed to establish a cure of her infection. She expressed full understanding  of these instructions. Abstinence from cigarettes, illicit drugs, and alcohol.  As her hepatitis B surface antibody confirmed immunity, she will not need vaccination for hepatitis B.  She will be offered vaccination for hepatitis A at her next visit.

## 2019-07-12 NOTE — Patient Instructions (Signed)
Return for lab draw exactly 4 weeks after you start harvoni. Return for appointment with Dr. Prince Rome in 6 weeks. Abstain from alcohol and illicit drugs. Use condoms with all sexual encounters until HCV has been cured.

## 2019-07-14 ENCOUNTER — Telehealth: Payer: Self-pay | Admitting: Pharmacy Technician

## 2019-07-14 NOTE — Telephone Encounter (Addendum)
[facility] Patient Advocate Encounter   Received notification from [place] that prior authorization for HARVONI is required.   PA submitted on 07/14/2019 Key [id number] W Status is APPROVED.    I have left a HIPPA compliant voicemail and email asking her to contact me at the office.    Once I can talk with her directly I will let her know of the approval and set up shipment.   Tiffany Salazar. [name] Patient [facility] for Infectious Disease Phone: [phone] Fax:  [phone]

## 2019-07-18 ENCOUNTER — Encounter: Payer: Self-pay | Admitting: Pharmacy Technician

## 2019-07-18 ENCOUNTER — Other Ambulatory Visit: Payer: Self-pay | Admitting: Pharmacist

## 2019-07-18 ENCOUNTER — Telehealth: Payer: Self-pay | Admitting: Pharmacist

## 2019-07-18 MED FILL — LEDIPASVIR-SOFOSBUVIR 90-40: 90-400 | 28 days supply | Qty: 28 | Fill #0

## 2019-07-18 NOTE — Telephone Encounter (Signed)
Thank you guys for getting this started.

## 2019-08-10 MED FILL — LEDIPASVIR-SOFOSBUVIR 90-40: 90-400 | 28 days supply | Qty: 28 | Fill #1

## 2019-08-16 ENCOUNTER — Other Ambulatory Visit: Payer: Medicaid Other

## 2019-08-29 ENCOUNTER — Telehealth: Payer: Self-pay

## 2019-08-29 ENCOUNTER — Ambulatory Visit: Payer: Medicaid Other | Admitting: Infectious Diseases

## 2019-08-29 NOTE — Telephone Encounter (Signed)
COVID-19 Pre-Screening Questions:08/29/2019  Do you currently have a fever (>100 F), chills or unexplained body aches? NO   Are you currently experiencing new cough, shortness of breath, sore throat, runny nose?NO .  Have you recently travelled outside the state of [place] in the last 14 days? NO .  Have you been in contact with someone that is currently pending confirmation of Covid19 testing or has been confirmed to have the Covid19 virus?  NO  **If the patient answers NO to ALL questions -  advise the patient to please call the clinic before coming to the office should any symptoms develop.     

## 2019-08-30 ENCOUNTER — Other Ambulatory Visit: Payer: Self-pay

## 2019-08-30 ENCOUNTER — Encounter: Payer: Self-pay | Admitting: Family

## 2019-08-30 ENCOUNTER — Ambulatory Visit (INDEPENDENT_AMBULATORY_CARE_PROVIDER_SITE_OTHER): Payer: Medicaid Other | Admitting: Family

## 2019-08-30 VITALS — BP 97/60 | HR 55 | Temp 98.0°F | Wt 221.0 lb

## 2019-08-30 DIAGNOSIS — O98419 Viral hepatitis complicating pregnancy, unspecified trimester: Secondary | ICD-10-CM

## 2019-08-30 DIAGNOSIS — B182 Chronic viral hepatitis C: Secondary | ICD-10-CM | POA: Diagnosis not present

## 2019-08-30 NOTE — Assessment & Plan Note (Signed)
Tiffany Salazar is in the sixth week of her 8-week therapy with Harvoni for genotype 1 a chronic hepatitis C with initial viral load of 201,000.  Check hepatitis C viral load today.  We reviewed the plan of care and answered questions.  Continue current dose of Harvoni until complete.  We will planned repeat hepatitis C viral load in 3 weeks and then cure visit in 3 months to ensure SVR is obtained.

## 2019-08-30 NOTE — Progress Notes (Signed)
Subjective:    Patient ID: Tiffany Salazar, female    DOB: 08-17-1981, 39 y.o.   MRN: [id number]  Chief Complaint  Patient presents with  . Hepatitis C     HPI:  Tiffany Salazar is a 39 y.o. female with chronic hepatitis C who was last seen in the office on 07/12/2019 with lab work showing genotype 1a hepatitis C with a viral load of 201,000.  FibroScan performed showed F score of F0/F1.  She was started on Harvoni for 8 weeks.  Tiffany Salazar continues to take her Harvoni as prescribed with no adverse side effects or missed doses.  Overall feeling well today with no new concerns/complaints.  She is currently in week 6 of therapy with Harvoni.  She is asymptomatic and denies jaundice, scleral icterus, nausea, vomiting, or diarrhea.   Allergies  Allergen Reactions  . Gentamicin Rash  . Vancomycin Rash      Outpatient Medications Prior to Visit  Medication Sig Dispense Refill  . albuterol (PROVENTIL HFA;VENTOLIN HFA) 108 (90 Base) MCG/ACT inhaler Inhale 2 puffs into the lungs every 6 (six) hours as needed for wheezing or shortness of breath. 1 Inhaler 2  . aspirin EC 81 MG tablet Take 81 mg by mouth daily.    . Brexpiprazole (REXULTI) 4 MG TABS Take 4 mg by mouth daily.    . buprenorphine (SUBUTEX) 8 MG SUBL SL tablet Place 8 mg under the tongue 3 (three) times daily.     . citalopram (CELEXA) 20 MG tablet Take 20 mg by mouth daily.    Marland Kitchen ibuprofen (ADVIL) 800 MG tablet Take 1 tablet (800 mg total) by mouth 3 (three) times daily with meals as needed for headache, mild pain, moderate pain or cramping. 30 tablet 2  . Ledipasvir-Sofosbuvir (HARVONI) 90-400 MG TABS Take 1 tablet by mouth daily. 28 tablet 1  . modafinil (PROVIGIL) 200 MG tablet Take 200 mg by mouth 2 (two) times daily.    . polyethylene glycol (MIRALAX) packet Take 17 g by mouth 2 (two) times daily. May increase to three times a day if no result 14 each 5  . Prenatal Vit-Fe Fumarate-FA (PREPLUS) 27-1 MG TABS Take 1 tablet by  mouth daily. 30 tablet 13  . senna-docusate (SENOKOT-S) 8.6-50 MG tablet Take 2 tablets by mouth at bedtime as needed for mild constipation or moderate constipation. 30 tablet 2  . amoxicillin (AMOXIL) 500 MG tablet Take 500 mg by mouth as directed. Take 4 tablets one hour prior to dental procedure     No facility-administered medications prior to visit.     Past Medical History:  Diagnosis Date  . AMA (advanced maternal age) multigravida 29+, unspecified trimester 09/20/2018  . Hepatitis C   . MRSA infection 03/21/2015  . Pregnancy complicated by subutex maintenance, antepartum ([place]) 10/20/2018  . Subdural hematoma ([place]) 12/26/2013  . Substance use disorder   . Tricuspid valve replaced   . Unwanted fertility 02/09/2019   Signed BTL papers     Past Surgical History:  Procedure Laterality Date  . CESAREAN SECTION  x 3  . CESAREAN SECTION WITH BILATERAL TUBAL LIGATION Bilateral 03/29/2019   Procedure: CESAREAN SECTION WITH BILATERAL TUBAL LIGATION;  Surgeon: Emily Filbert, MD;  [facility];  Service: Obstetrics;  Laterality: Bilateral;  . TEE WITHOUT CARDIOVERSION N/A 03/23/2018   Procedure: TRANSESOPHAGEAL ECHOCARDIOGRAM (TEE);  Surgeon: Pixie Casino, MD;  Location[facility] ENDOSCOPY;  Service: Cardiovascular;  Laterality: N/A;  . TEE WITHOUT CARDIOVERSION  N/A 03/29/2018   Procedure: TRANSESOPHAGEAL ECHOCARDIOGRAM (TEE);  Surgeon: Gaye Pollack, MD;  Location: [place];  Service: Open Heart Surgery;  Laterality: N/A;  . TRICUSPID VALVE REPLACEMENT N/A 03/29/2018   Procedure: TRICUSPID VALVE REPLACEMENT Using 27 mm Magna Ease Mitral Valve Pericardial Bioprosthesis;  Surgeon: Gaye Pollack, MD;  Location: [place];  Service: Open Heart Surgery;  Laterality: N/A;       Review of Systems  Constitutional: Negative for chills, fatigue, fever and unexpected weight change.  Respiratory: Negative for cough, chest tightness, shortness of breath and wheezing.   Cardiovascular: Negative for chest  pain and leg swelling.  Gastrointestinal: Negative for abdominal distention, constipation, diarrhea, nausea and vomiting.  Neurological: Negative for dizziness, weakness, light-headedness and headaches.  Hematological: Does not bruise/bleed easily.      Objective:    BP 97/60   Pulse (!) 55   Temp 98 F (36.7 C)   Wt 221 lb (100.2 kg)   BMI 33.60 kg/m  Nursing note and vital signs reviewed.  Physical Exam Constitutional:      General: She is not in acute distress.    Appearance: She is well-developed.  Cardiovascular:     Rate and Rhythm: Normal rate and regular rhythm.     Heart sounds: Normal heart sounds. No murmur. No friction rub. No gallop.   Pulmonary:     Effort: Pulmonary effort is normal. No respiratory distress.     Breath sounds: Normal breath sounds. No wheezing or rales.  Chest:     Chest wall: No tenderness.  Abdominal:     General: Bowel sounds are normal. There is no distension.     Palpations: Abdomen is soft. There is no mass.     Tenderness: There is no abdominal tenderness. There is no guarding or rebound.  Skin:    General: Skin is warm and dry.  Neurological:     Mental Status: She is alert and oriented to person, place, and time.  Psychiatric:        Behavior: Behavior normal.        Thought Content: Thought content normal.        Judgment: Judgment normal.      Depression screen [facility] 2/9 07/12/2019 06/21/2019 02/09/2019 01/05/2019 09/20/2018  Decreased Interest 0 0 0 0 0  Down, Depressed, Hopeless 0 0 0 0 0  PHQ - 2 Score 0 0 0 0 0  Altered sleeping - - 0 3 2  Tired, decreased energy - - 0 3 1  Change in appetite - - 0 0 0  Feeling bad or failure about yourself  - - 0 0 0  Trouble concentrating - - 0 2 0  Moving slowly or fidgety/restless - - 0 0 0  Suicidal thoughts - - 0 0 0  PHQ-9 Score - - 0 8 3       Assessment & Plan:    Patient Active Problem List   Diagnosis Date Noted  . Hepatitis C, chronic, maternal, antepartum ([place])  01/05/2019  . History of tricuspid valve replacement with bioprosthetic valve 09/20/2018  . Tobacco use disorder 08/02/2018  . Hepatitis C antibody positive in blood   . 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 Depression 11/13/2016  . Substance use disorder 12/30/2013     Problem List Items Addressed This Visit      Digestive   Hepatitis C, chronic, maternal, antepartum ([place]) - Primary    Tiffany Salazar is in the sixth week of her  8-week therapy with Harvoni for genotype 1 a chronic hepatitis C with initial viral load of 201,000.  Check hepatitis C viral load today.  We reviewed the plan of care and answered questions.  Continue current dose of Harvoni until complete.  We will planned repeat hepatitis C viral load in 3 weeks and then cure visit in 3 months to ensure SVR is obtained.      Relevant Orders   Hepatitis C RNA quantitative   Comp Met (CMET)       I am having Nino Glow. Micucci maintain her buprenorphine, polyethylene glycol, PrePLUS, albuterol, aspirin EC, ibuprofen, senna-docusate, modafinil, Rexulti, citalopram, amoxicillin, and Ledipasvir-Sofosbuvir.   Follow-up: Return in about 3 months (around 11/28/2019).   Terri Piedra, MSN, FNP-C Nurse Practitioner [facility] for Infectious Disease [place] number: [phone]

## 2019-08-30 NOTE — Patient Instructions (Signed)
Nice to see you.  Continue to take your Harvoni until completed.  We will check your blood work today.  Please schedule a lab appointment in 3 weeks to recheck your viral load.  Schedule an office visit in 3 months from completion of treatment with lab work 1 week prior to your appointment for cure visit.  Have a great day and stay safe!

## 2019-09-02 LAB — COMPREHENSIVE METABOLIC PANEL
AG Ratio: 1.4 (calc) (ref 1.0–2.5)
ALT: 13 U/L (ref 6–29)
AST: 25 U/L (ref 10–30)
Albumin: 3.9 g/dL (ref 3.6–5.1)
Alkaline phosphatase (APISO): 83 U/L (ref 31–125)
BUN: 14 mg/dL (ref 7–25)
CO2: 26 mmol/L (ref 20–32)
Calcium: 8.8 mg/dL (ref 8.6–10.2)
Chloride: 103 mmol/L (ref 98–110)
Creat: 0.95 mg/dL (ref 0.50–1.10)
Globulin: 2.7 g/dL (calc) (ref 1.9–3.7)
Glucose, Bld: 84 mg/dL (ref 65–99)
Potassium: 5 mmol/L (ref 3.5–5.3)
Sodium: 139 mmol/L (ref 135–146)
Total Bilirubin: 0.3 mg/dL (ref 0.2–1.2)
Total Protein: 6.6 g/dL (ref 6.1–8.1)

## 2019-09-02 LAB — HEPATITIS C RNA QUANTITATIVE
HCV Quantitative Log: <1.18 Log IU/mL
HCV RNA, PCR, QN: <15 IU/mL

## 2019-09-20 ENCOUNTER — Other Ambulatory Visit: Payer: Medicaid Other

## 2019-11-06 ENCOUNTER — Other Ambulatory Visit: Payer: Self-pay | Admitting: Family Medicine

## 2019-11-06 DIAGNOSIS — J302 Other seasonal allergic rhinitis: Secondary | ICD-10-CM

## 2019-11-10 ENCOUNTER — Other Ambulatory Visit: Payer: Medicaid Other

## 2019-11-10 ENCOUNTER — Other Ambulatory Visit: Payer: Self-pay

## 2019-11-10 DIAGNOSIS — O98419 Viral hepatitis complicating pregnancy, unspecified trimester: Secondary | ICD-10-CM | POA: Diagnosis not present

## 2019-11-10 DIAGNOSIS — B182 Chronic viral hepatitis C: Secondary | ICD-10-CM | POA: Diagnosis not present

## 2019-11-12 LAB — HEPATITIS C RNA QUANTITATIVE
HCV Quantitative Log: <1.18 Log IU/mL — AB
HCV RNA, PCR, QN: <15 IU/mL — AB

## 2019-11-22 ENCOUNTER — Telehealth: Payer: Self-pay | Admitting: Family Medicine

## 2019-11-22 ENCOUNTER — Telehealth: Payer: Self-pay

## 2019-11-22 DIAGNOSIS — M21612 Bunion of left foot: Secondary | ICD-10-CM

## 2019-11-22 DIAGNOSIS — M21611 Bunion of right foot: Secondary | ICD-10-CM

## 2019-11-22 NOTE — Telephone Encounter (Signed)
Opened in error and documented elsewhere.  .Sharline Lehane R Reyanne Hussar, CMA  

## 2019-11-22 NOTE — Telephone Encounter (Signed)
Referral placed, please let patient know.  Kalaysia Demonbreun, MD  Family Medicine Teaching Service   

## 2019-11-22 NOTE — Telephone Encounter (Signed)
Pt is calling because she would like to have a referral to see a podiatrist. She has very large bunions on both of her feet and she is having problem walking and wearing some types of shoes.

## 2019-11-22 NOTE — Telephone Encounter (Signed)
Called patient with referral information.  [facility] [address] [phone]  Patient also wants to speak to Dr. Manson Passey concerning weight loss surgery.  Patient has already made appointment but wants to discuss it with Dr. Manson Passey.  Glennie Hawk, CMA

## 2019-11-24 ENCOUNTER — Ambulatory Visit (INDEPENDENT_AMBULATORY_CARE_PROVIDER_SITE_OTHER): Payer: Medicaid Other | Admitting: Family

## 2019-11-24 ENCOUNTER — Ambulatory Visit: Payer: Medicaid Other | Attending: Internal Medicine

## 2019-11-24 ENCOUNTER — Encounter: Payer: Self-pay | Admitting: Family

## 2019-11-24 ENCOUNTER — Other Ambulatory Visit: Payer: Self-pay

## 2019-11-24 DIAGNOSIS — O98419 Viral hepatitis complicating pregnancy, unspecified trimester: Secondary | ICD-10-CM | POA: Diagnosis not present

## 2019-11-24 DIAGNOSIS — Z23 Encounter for immunization: Secondary | ICD-10-CM

## 2019-11-24 DIAGNOSIS — B182 Chronic viral hepatitis C: Secondary | ICD-10-CM

## 2019-11-24 NOTE — Progress Notes (Signed)
   Covid-19 Vaccination Clinic  Name:  Tiffany Salazar    MRN: [id number] DOB: 1981/04/26  11/24/2019  [name] was observed post Covid-19 immunization for 30 minutes based on pre-vaccination screening without incident. She was provided with Vaccine Information Sheet and instruction to access the V-Safe system.   [name] was instructed to call 911 with any severe reactions post vaccine: Marland Kitchen Difficulty breathing  . Swelling of face and throat  . A fast heartbeat  . A bad rash all over body  . Dizziness and weakness   Immunizations Administered    Name Date Dose VIS Date Route   Pfizer COVID-19 Vaccine 11/24/2019  3:30 PM 0.3 mL 08/05/2019 Intramuscular   Manufacturer: [employer], [employer]   Lot: [id number]   NDC: [address]-7

## 2019-11-24 NOTE — Patient Instructions (Addendum)
Nice to see you.  You are cured from Hepatitis C. No further treatment is necessary.   Recommend follow up with primary care for routine liver screening every 6-12 months.  Check out the resource:  Dietdoctor.com The Obesity Code by Wylene Simmer PE Diet by Vincente Liberty (Burnfatnotsugar)  Follow up with ID as recommended.   Have a great day and stay safe

## 2019-11-24 NOTE — Progress Notes (Signed)
Subjective:    Patient ID: Tiffany Salazar, female    DOB: 04-09-1981, 39 y.o.   MRN: [id number]  Chief Complaint  Patient presents with  . Hepatitis C    HPI:  Tiffany Salazar is a 39 y.o. female with chronic Hepatitis C who was last seen in the office on 08/30/19 near the completion of treatment with Harvoni. Blood work at the time showed a Hepatitis C RNA level that was undetectable. Most recent blood work completed on 11/10/19 continues to be undetectable.   Tiffany Salazar has been doing well with no abdominal pain, nausea, vomiting, scleral icterus, or jaundice. She is concerned about losing weight and has appointment with the weight loss center.    Allergies  Allergen Reactions  . Gentamicin Rash  . Vancomycin Rash      Outpatient Medications Prior to Visit  Medication Sig Dispense Refill  . aspirin EC 81 MG tablet Take 81 mg by mouth daily.    . Brexpiprazole (REXULTI) 4 MG TABS Take 4 mg by mouth daily.    . buprenorphine (SUBUTEX) 8 MG SUBL SL tablet Place 8 mg under the tongue 3 (three) times daily.     . citalopram (CELEXA) 20 MG tablet Take 20 mg by mouth daily.    Marland Kitchen ibuprofen (ADVIL) 800 MG tablet Take 1 tablet (800 mg total) by mouth 3 (three) times daily with meals as needed for headache, mild pain, moderate pain or cramping. 30 tablet 2  . Ledipasvir-Sofosbuvir (HARVONI) 90-400 MG TABS Take 1 tablet by mouth daily. 28 tablet 1  . modafinil (PROVIGIL) 200 MG tablet Take 200 mg by mouth 2 (two) times daily.    . polyethylene glycol (MIRALAX) packet Take 17 g by mouth 2 (two) times daily. May increase to three times a day if no result 14 each 5  . Prenatal Vit-Fe Fumarate-FA (PREPLUS) 27-1 MG TABS Take 1 tablet by mouth daily. 30 tablet 13  . senna-docusate (SENOKOT-S) 8.6-50 MG tablet Take 2 tablets by mouth at bedtime as needed for mild constipation or moderate constipation. 30 tablet 2  . VENTOLIN HFA 108 (90 Base) MCG/ACT inhaler INHALE TWO PUFFS BY MOUTH EVERY 6 HOURS AS  NEEDED FOR WHEEZING OR SHORTNESS OF BREATH 18 g 1  . amoxicillin (AMOXIL) 500 MG tablet Take 500 mg by mouth as directed. Take 4 tablets one hour prior to dental procedure     No facility-administered medications prior to visit.     Past Medical History:  Diagnosis Date  . AMA (advanced maternal age) multigravida 63+, unspecified trimester 09/20/2018  . Hepatitis C   . MRSA infection 03/21/2015  . Pregnancy complicated by subutex maintenance, antepartum ([place]) 10/20/2018  . Subdural hematoma ([place]) 12/26/2013  . Substance use disorder   . Tricuspid valve replaced   . Unwanted fertility 02/09/2019   Signed BTL papers      Past Surgical History:  Procedure Laterality Date  . CESAREAN SECTION  x 3  . CESAREAN SECTION WITH BILATERAL TUBAL LIGATION Bilateral 03/29/2019   Procedure: CESAREAN SECTION WITH BILATERAL TUBAL LIGATION;  Surgeon: Emily Filbert, MD;  [facility];  Service: Obstetrics;  Laterality: Bilateral;  . TEE WITHOUT CARDIOVERSION N/A 03/23/2018   Procedure: TRANSESOPHAGEAL ECHOCARDIOGRAM (TEE);  Surgeon: Pixie Casino, MD;  Location: [place];  Service: Cardiovascular;  Laterality: N/A;  . TEE WITHOUT CARDIOVERSION N/A 03/29/2018   Procedure: TRANSESOPHAGEAL ECHOCARDIOGRAM (TEE);  Surgeon: Gaye Pollack, MD;  Location: [place];  Service: Open  Heart Surgery;  Laterality: N/A;  . TRICUSPID VALVE REPLACEMENT N/A 03/29/2018   Procedure: TRICUSPID VALVE REPLACEMENT Using 27 mm Magna Ease Mitral Valve Pericardial Bioprosthesis;  Surgeon: Alleen Borne, MD;  [facility] OR;  Service: Open Heart Surgery;  Laterality: N/A;      Family History  Problem Relation Age of Onset  . Thyroid disease Mother   . Breast cancer Mother 31       breast  . Alcohol abuse Father       Social History   Socioeconomic History  . Marital status: Single    Spouse name: Not on file  . Number of children: Not on file  . Years of education: Not on file  . Highest education level: Not  on file  Occupational History  . Occupation: unemployed  Tobacco Use  . Smoking status: Current Every Day Smoker    Packs/day: 1.00    Types: E-cigarettes  . Smokeless tobacco: Current User  . Tobacco comment: quit smoking cigarretted; vape  Substance and Sexual Activity  . Alcohol use: Not Currently    Alcohol/week: 0.0 standard drinks    Comment: Pt stated she has started goint ot rehab clinic  . Drug use: Not Currently    Types: IV, Cocaine, Heroin, Marijuana    Comment: March 20, 2018 last time used drugs. subutex  . Sexual activity: Not Currently    Partners: Female, Female    Birth control/protection: None    Comment: offered condoms; declined 06/2019  Other Topics Concern  . Not on file  Social History Narrative   The patient has 3 daughters.  Her oldest daughter was born in 2003 cesarean delivery.  Her middle daughter was born in 2012.  Her youngest daughter was born in the fall 2018.      She is currently living with her boyfriend whose name is [name].  Reports her and [name] recently got engaged as a fall 2019.   Social Determinants of Health   Financial Resource Strain:   . Difficulty of Paying Living Expenses:   Food Insecurity:   . Worried About Programme researcher, broadcasting/film/video in the Last Year:   . Barista in the Last Year:   Transportation Needs:   . Freight forwarder (Medical):   Marland Kitchen Lack of Transportation (Non-Medical):   Physical Activity:   . Days of Exercise per Week:   . Minutes of Exercise per Session:   Stress:   . Feeling of Stress :   Social Connections:   . Frequency of Communication with Friends and Family:   . Frequency of Social Gatherings with Friends and Family:   . Attends Religious Services:   . Active Member of Clubs or Organizations:   . Attends Banker Meetings:   Marland Kitchen Marital Status:   Intimate Partner Violence:   . Fear of Current or Ex-Partner:   . Emotionally Abused:   Marland Kitchen Physically Abused:   . Sexually Abused:        Review of Systems  Constitutional: Negative for chills, fatigue, fever and unexpected weight change.  Respiratory: Negative for cough, chest tightness, shortness of breath and wheezing.   Cardiovascular: Negative for chest pain and leg swelling.  Gastrointestinal: Negative for abdominal distention, constipation, diarrhea, nausea and vomiting.  Neurological: Negative for dizziness, weakness, light-headedness and headaches.  Hematological: Does not bruise/bleed easily.       Objective:    BP 116/77   Pulse 83   Temp 97.6 F (36.4  C)   Ht 5\' 7"  (1.702 m)   Wt 212 lb (96.2 kg)   SpO2 99%   BMI 33.20 kg/m  Nursing note and vital signs reviewed.  Physical Exam Constitutional:      General: She is not in acute distress.    Appearance: She is well-developed.  Cardiovascular:     Rate and Rhythm: Normal rate and regular rhythm.     Heart sounds: Normal heart sounds. No murmur. No friction rub. No gallop.   Pulmonary:     Effort: Pulmonary effort is normal. No respiratory distress.     Breath sounds: Normal breath sounds. No wheezing or rales.  Chest:     Chest wall: No tenderness.  Abdominal:     General: Bowel sounds are normal. There is no distension.     Palpations: Abdomen is soft. There is no mass.     Tenderness: There is no abdominal tenderness. There is no guarding or rebound.  Skin:    General: Skin is warm and dry.  Neurological:     Mental Status: She is alert and oriented to person, place, and time.  Psychiatric:        Behavior: Behavior normal.        Thought Content: Thought content normal.        Judgment: Judgment normal.         Assessment & Plan:   Patient Active Problem List   Diagnosis Date Noted  . Hepatitis C, chronic, maternal, antepartum ([facility]) 01/05/2019  . History of tricuspid valve replacement with bioprosthetic valve 09/20/2018  . Tobacco use disorder 08/02/2018  . Hepatitis C antibody positive in blood   . Endocarditis of  tricuspid valve 03/20/2018  . Depression 11/13/2016  . Substance use disorder 12/30/2013     Problem List Items Addressed This Visit      Digestive   Hepatitis C, chronic, maternal, antepartum ([facility])    Ms. Dunigan has completed treatment for Hepatitis C at the end of January and recent blood work confirms SVR12 with viral remaining undetectable.  No further treatment is necessary at this time.  Given risk of increased fibrosis recommend follow-up with primary care every 6 to 12 months for liver function tests and hepatocellular carcinoma screening.  If there is concern for hepatitis C in the future hepatitis C RNA level will need to be evaluated as hepatitis C antibody remains positive indefinitely.  Follow-up with ID as needed.          I am having February. [place] maintain her buprenorphine, polyethylene glycol, PrePLUS, aspirin EC, ibuprofen, senna-docusate, modafinil, Rexulti, citalopram, amoxicillin, Ledipasvir-Sofosbuvir, and Ventolin HFA.    Follow-up: Return if symptoms worsen or fail to improve.    Pasty Spillers, MSN, FNP-C Nurse Practitioner [facility] for Infectious Disease [facility] Main number: [phone]

## 2019-11-24 NOTE — Assessment & Plan Note (Signed)
Tiffany Salazar has completed treatment for Hepatitis C at the end of January and recent blood work confirms SVR12 with viral remaining undetectable.  No further treatment is necessary at this time.  Given risk of increased fibrosis recommend follow-up with primary care every 6 to 12 months for liver function tests and hepatocellular carcinoma screening.  If there is concern for hepatitis C in the future hepatitis C RNA level will need to be evaluated as hepatitis C antibody remains positive indefinitely.  Follow-up with ID as needed.

## 2019-12-15 ENCOUNTER — Ambulatory Visit: Payer: Medicaid Other | Admitting: Podiatry

## 2019-12-15 ENCOUNTER — Ambulatory Visit (INDEPENDENT_AMBULATORY_CARE_PROVIDER_SITE_OTHER): Payer: Medicaid Other

## 2019-12-15 ENCOUNTER — Other Ambulatory Visit: Payer: Self-pay

## 2019-12-15 DIAGNOSIS — M216X1 Other acquired deformities of right foot: Secondary | ICD-10-CM | POA: Diagnosis not present

## 2019-12-15 DIAGNOSIS — M2012 Hallux valgus (acquired), left foot: Secondary | ICD-10-CM | POA: Diagnosis not present

## 2019-12-15 DIAGNOSIS — M21611 Bunion of right foot: Secondary | ICD-10-CM

## 2019-12-15 DIAGNOSIS — M21612 Bunion of left foot: Secondary | ICD-10-CM

## 2019-12-15 DIAGNOSIS — Z720 Tobacco use: Secondary | ICD-10-CM

## 2019-12-15 DIAGNOSIS — M21961 Unspecified acquired deformity of right lower leg: Secondary | ICD-10-CM

## 2019-12-15 NOTE — Patient Instructions (Signed)
Pre-Operative Instructions  Congratulations, you have decided to take an important step towards improving your quality of life.  You can be assured that the doctors and staff at [facility] will be with you every step of the way.  Here are some important things you should know:  1. Plan to be at the surgery center/hospital at least 1 (one) hour prior to your scheduled time, unless otherwise directed by the surgical center/hospital staff.  You must have a responsible adult accompany you, remain during the surgery and drive you home.  Make sure you have directions to the surgical center/hospital to ensure you arrive on time. 2. If you are having surgery at [facility] or [facility], you will need a copy of your medical history and physical form from your family physician within one month prior to the date of surgery. We will give you a form for your primary physician to complete.  3. We make every effort to accommodate the date you request for surgery.  However, there are times where surgery dates or times have to be moved.  We will contact you as soon as possible if a change in schedule is required.   4. No aspirin/ibuprofen for one week before surgery.  If you are on aspirin, any non-steroidal anti-inflammatory medications (Mobic, Aleve, Ibuprofen) should not be taken seven (7) days prior to your surgery.  You make take Tylenol for pain prior to surgery.  5. Medications - If you are taking daily heart and blood pressure medications, seizure, reflux, allergy, asthma, anxiety, pain or diabetes medications, make sure you notify the surgery center/hospital before the day of surgery so they can tell you which medications you should take or avoid the day of surgery. 6. No food or drink after midnight the night before surgery unless directed otherwise by surgical center/hospital staff. 7. No alcoholic beverages 24-hours prior to surgery.  No smoking 24-hours prior or 24-hours after  surgery. 8. Wear loose pants or shorts. They should be loose enough to fit over bandages, boots, and casts. 9. Don't wear slip-on shoes. Sneakers are preferred. 10. Bring your boot with you to the surgery center/hospital.  Also bring crutches or a walker if your physician has prescribed it for you.  If you do not have this equipment, it will be provided for you after surgery. 11. If you have not been contacted by the surgery center/hospital by the day before your surgery, call to confirm the date and time of your surgery. 12. Leave-time from work may vary depending on the type of surgery you have.  Appropriate arrangements should be made prior to surgery with your employer. 13. Prescriptions will be provided immediately following surgery by your doctor.  Fill these as soon as possible after surgery and take the medication as directed. Pain medications will not be refilled on weekends and must be approved by the doctor. 14. Remove nail polish on the operative foot and avoid getting pedicures prior to surgery. 15. Wash the night before surgery.  The night before surgery wash the foot and leg well with water and the antibacterial soap provided. Be sure to pay special attention to beneath the toenails and in between the toes.  Wash for at least three (3) minutes. Rinse thoroughly with water and dry well with a towel.  Perform this wash unless told not to do so by your physician.  Enclosed: 1 Ice pack (please put in freezer the night before surgery)   1 Hibiclens skin cleaner     Pre-op instructions  If you have any questions regarding the instructions, please do not hesitate to call our office.  [place]: [address], , [place] [address] -- [phone]  [place]: [address] , [place] [address] -- [phone]  [place]: [address], , [place] [address] -- [phone]   Website: [email or web address] 

## 2019-12-18 NOTE — Progress Notes (Signed)
  Subjective:  Patient ID: Tiffany Salazar, female    DOB: 1981/04/15,  MRN: [id number]  Chief Complaint  Patient presents with  . New Patient (Initial Visit)  . Foot Pain    bilateral bunions and possible hammertoes. ongoing for 6+ years. mother had them. sharp and throbbing sensation. can't wear certain shoes . has had no treatment.   . Consult    for bilateral bunions    39 y.o. female presents with the above complaint. History confirmed with patient.   Objective:  Physical Exam: warm, good capillary refill, no trophic changes or ulcerative lesions, normal DP and PT pulses and normal sensory exam. Left Foot: bunion deformity noted with pain to palpation Right Foot: bunion deformity noted with pain to palpation no pain palpation about the second third metatarsophalangeal joints  No images are attached to the encounter.  Radiographs: X-ray of both feet: no fracture, dislocation, swelling or degenerative changes noted and hallux valgus deformity Assessment:   1. Bilateral bunions   2. Hallux valgus with bunions of left foot   3. Metatarsal deformity, right   4. Tobacco abuse      Plan:  Patient was evaluated and treated and all questions answered.  Hallux valgus bilaterally -XR reviewed with patient -Educated on etiology of deformity -Discussed proper shoe gear modifications and padding -Patient would like to proceed with surgical correction.  Discussed risks of proceeding with surgery with continued smoking.  Discussed the importance of smoking cessation prior to surgery.  Encourage patient to quit smoking and will recheck this at her next visit.  Plan for surgery a short time after that if smoking is stopped. -Discussed should benefit from simple Austin bunionectomy left however on the right side she may need metatarsal shortening osteotomies -Patient has failed all conservative therapy and wishes to proceed with surgical intervention. All risks, benefits, and alternatives  discussed with patient. No guarantees given. Consent reviewed and signed by patient. -Planned procedures: Austin bunionectomy left  Return in about 1 month (around 01/14/2020) for surgical planning visit.

## 2019-12-19 ENCOUNTER — Ambulatory Visit: Payer: Medicaid Other

## 2019-12-20 ENCOUNTER — Ambulatory Visit: Payer: Medicaid Other | Attending: Internal Medicine

## 2019-12-20 DIAGNOSIS — Z23 Encounter for immunization: Secondary | ICD-10-CM

## 2019-12-20 NOTE — Progress Notes (Signed)
   Covid-19 Vaccination Clinic  Name:  Tiffany Salazar    MRN: [id number] DOB: 07-10-1981  12/20/2019  Tiffany Salazar was observed post Covid-19 immunization for 15 minutes without incident. She was provided with Vaccine Information Sheet and instruction to access the V-Safe system.   Tiffany Salazar was instructed to call 911 with any severe reactions post vaccine: Marland Kitchen Difficulty breathing  . Swelling of face and throat  . A fast heartbeat  . A bad rash all over body  . Dizziness and weakness   Immunizations Administered    Name Date Dose VIS Date Route   Pfizer COVID-19 Vaccine 12/20/2019  4:56 PM 0.3 mL 10/19/2018 Intramuscular   Manufacturer: [employer], [employer]   Lot: [id number]   NDC: [address]-9

## 2019-12-28 ENCOUNTER — Other Ambulatory Visit: Payer: Self-pay | Admitting: Podiatry

## 2019-12-28 DIAGNOSIS — M2012 Hallux valgus (acquired), left foot: Secondary | ICD-10-CM

## 2019-12-28 DIAGNOSIS — M21612 Bunion of left foot: Secondary | ICD-10-CM

## 2019-12-28 DIAGNOSIS — M21961 Unspecified acquired deformity of right lower leg: Secondary | ICD-10-CM

## 2020-01-19 ENCOUNTER — Other Ambulatory Visit: Payer: Self-pay

## 2020-01-19 ENCOUNTER — Ambulatory Visit (INDEPENDENT_AMBULATORY_CARE_PROVIDER_SITE_OTHER): Payer: Medicaid Other | Admitting: Podiatry

## 2020-01-19 DIAGNOSIS — M21611 Bunion of right foot: Secondary | ICD-10-CM

## 2020-01-19 DIAGNOSIS — M2042 Other hammer toe(s) (acquired), left foot: Secondary | ICD-10-CM | POA: Diagnosis not present

## 2020-01-19 DIAGNOSIS — M21622 Bunionette of left foot: Secondary | ICD-10-CM

## 2020-01-19 DIAGNOSIS — M2012 Hallux valgus (acquired), left foot: Secondary | ICD-10-CM

## 2020-01-19 DIAGNOSIS — M21612 Bunion of left foot: Secondary | ICD-10-CM

## 2020-01-19 DIAGNOSIS — Z720 Tobacco use: Secondary | ICD-10-CM

## 2020-01-19 NOTE — Patient Instructions (Signed)
Pre-Operative Instructions  Congratulations, you have decided to take an important step towards improving your quality of life.  You can be assured that the doctors and staff at [facility] will be with you every step of the way.  Here are some important things you should know:  1. Plan to be at the surgery center/hospital at least 1 (one) hour prior to your scheduled time, unless otherwise directed by the surgical center/hospital staff.  You must have a responsible adult accompany you, remain during the surgery and drive you home.  Make sure you have directions to the surgical center/hospital to ensure you arrive on time. 2. If you are having surgery at [facility] or [facility], you will need a copy of your medical history and physical form from your family physician within one month prior to the date of surgery. We will give you a form for your primary physician to complete.  3. We make every effort to accommodate the date you request for surgery.  However, there are times where surgery dates or times have to be moved.  We will contact you as soon as possible if a change in schedule is required.   4. No aspirin/ibuprofen for one week before surgery.  If you are on aspirin, any non-steroidal anti-inflammatory medications (Mobic, Aleve, Ibuprofen) should not be taken seven (7) days prior to your surgery.  You make take Tylenol for pain prior to surgery.  5. Medications - If you are taking daily heart and blood pressure medications, seizure, reflux, allergy, asthma, anxiety, pain or diabetes medications, make sure you notify the surgery center/hospital before the day of surgery so they can tell you which medications you should take or avoid the day of surgery. 6. No food or drink after midnight the night before surgery unless directed otherwise by surgical center/hospital staff. 7. No alcoholic beverages 24-hours prior to surgery.  No smoking 24-hours prior or 24-hours after  surgery. 8. Wear loose pants or shorts. They should be loose enough to fit over bandages, boots, and casts. 9. Don't wear slip-on shoes. Sneakers are preferred. 10. Bring your boot with you to the surgery center/hospital.  Also bring crutches or a walker if your physician has prescribed it for you.  If you do not have this equipment, it will be provided for you after surgery. 11. If you have not been contacted by the surgery center/hospital by the day before your surgery, call to confirm the date and time of your surgery. 12. Leave-time from work may vary depending on the type of surgery you have.  Appropriate arrangements should be made prior to surgery with your employer. 13. Prescriptions will be provided immediately following surgery by your doctor.  Fill these as soon as possible after surgery and take the medication as directed. Pain medications will not be refilled on weekends and must be approved by the doctor. 14. Remove nail polish on the operative foot and avoid getting pedicures prior to surgery. 15. Wash the night before surgery.  The night before surgery wash the foot and leg well with water and the antibacterial soap provided. Be sure to pay special attention to beneath the toenails and in between the toes.  Wash for at least three (3) minutes. Rinse thoroughly with water and dry well with a towel.  Perform this wash unless told not to do so by your physician.  Enclosed: 1 Ice pack (please put in freezer the night before surgery)   1 Hibiclens skin cleaner     Pre-op instructions  If you have any questions regarding the instructions, please do not hesitate to call our office.  [place]: [address], [place], [place] [address] -- [phone]  [place]: [address] [place], [place] [address] -- [phone]  [place]: [address], [place], [place] [address] -- [phone]   Website: [email or web address] 

## 2020-01-19 NOTE — Progress Notes (Signed)
  Subjective:  Patient ID: Tiffany Salazar, female    DOB: Oct 04, 1980,  MRN: [id number]  Chief Complaint  Patient presents with  . Bunions    Surgical consult bunions  . Nail Problem    Pt states lifelong ingrown nails, left 1st lateral and medial, right 1st medial    39 y.o. female presents with the above complaint. History confirmed with patient.  Also complains about pain with her tailor's bunions.  If possible would like this corrected as well as her worst pain is when she wears her shoe and the squeeze and she feels like the toes go numb.  Objective:  Physical Exam: warm, good capillary refill, no trophic changes or ulcerative lesions, normal DP and PT pulses and normal sensory exam. Left Foot: bunion deformity noted with pain to palpation; pain location about the left fifth MPJ with tailor's bunion deformity.  Fifth hammertoe deformity.  Ingrown nail left hallux Right Foot: bunion deformity noted with pain to palpation; no pain palpation about the second third metatarsophalangeal joints.  Ingrown nail right hallux  No images are attached to the encounter.  Radiographs: 4/22 X-ray of both feet: no fracture, dislocation, swelling or degenerative changes noted and hallux valgus deformity Assessment:   1. Bilateral bunions   2. Hallux valgus with bunions of left foot   3. Tobacco abuse   4. Hammer toe of left foot   5. Tailor's bunion of left foot      Plan:  Patient was evaluated and treated and all questions answered.  Hallux valgus bilaterally -Prior x-rays reviewed with patient. -She is starting pain at the bunion level also mentions that she is having pain in her tailor's bunion today.  Would like to discuss correction of this as well. -Also complains of ingrown toenail to left great toe and right great toe would like these taken care of. -Instead we will plan for surgical correction of both the left foot bunion, tailor's bunion, fifth toe hammertoe, ingrown toenail left  great toe both borders -Patient has failed all conservative therapy and wishes to proceed with surgical intervention. All risks, benefits, and alternatives discussed with patient. No guarantees given. Consent reviewed and signed by patient. -Discussed continued efforts to quit tobacco use.  She is currently vaping.  Discussed increased risk of complications with tobacco use -Planned procedures: Left foot Austin bunionectomy, tailor's bunionectomy with possible osteotomy, fifth hammertoe correction, ingrown toenail with matrixectomy  No follow-ups on file.

## 2020-02-03 ENCOUNTER — Encounter: Payer: Medicaid Other | Admitting: Podiatry

## 2020-02-06 ENCOUNTER — Other Ambulatory Visit: Payer: Self-pay | Admitting: Cardiovascular Disease

## 2020-02-08 ENCOUNTER — Encounter: Payer: Self-pay | Admitting: Podiatry

## 2020-02-08 ENCOUNTER — Other Ambulatory Visit: Payer: Self-pay | Admitting: Podiatry

## 2020-02-08 DIAGNOSIS — M2012 Hallux valgus (acquired), left foot: Secondary | ICD-10-CM | POA: Diagnosis not present

## 2020-02-08 DIAGNOSIS — M25572 Pain in left ankle and joints of left foot: Secondary | ICD-10-CM | POA: Diagnosis not present

## 2020-02-08 DIAGNOSIS — M21622 Bunionette of left foot: Secondary | ICD-10-CM | POA: Diagnosis not present

## 2020-02-08 DIAGNOSIS — M2022 Hallux rigidus, left foot: Secondary | ICD-10-CM | POA: Diagnosis not present

## 2020-02-08 DIAGNOSIS — M205X2 Other deformities of toe(s) (acquired), left foot: Secondary | ICD-10-CM | POA: Diagnosis not present

## 2020-02-08 DIAGNOSIS — M21542 Acquired clubfoot, left foot: Secondary | ICD-10-CM | POA: Diagnosis not present

## 2020-02-08 DIAGNOSIS — G729 Myopathy, unspecified: Secondary | ICD-10-CM | POA: Diagnosis not present

## 2020-02-08 DIAGNOSIS — L600 Ingrowing nail: Secondary | ICD-10-CM | POA: Diagnosis not present

## 2020-02-08 DIAGNOSIS — M2042 Other hammer toe(s) (acquired), left foot: Secondary | ICD-10-CM | POA: Diagnosis not present

## 2020-02-08 MED ORDER — ONDANSETRON HCL 4 MG PO TABS: 4.0000 mg | ORAL_TABLET | Freq: Three times a day (TID) | ORAL | 0 refills | Status: DC | PRN

## 2020-02-08 MED ORDER — OXYCODONE-ACETAMINOPHEN 5-325 MG PO TABS: 1.0000 | ORAL_TABLET | ORAL | 0 refills | Status: DC | PRN

## 2020-02-08 MED ORDER — CEPHALEXIN 500 MG PO CAPS: 500.0000 mg | ORAL_CAPSULE | Freq: Two times a day (BID) | ORAL | 0 refills | Status: DC

## 2020-02-08 NOTE — Progress Notes (Signed)
Rx sent to pharmacy for outpt surgery

## 2020-02-09 ENCOUNTER — Telehealth: Payer: Self-pay | Admitting: Podiatry

## 2020-02-09 ENCOUNTER — Telehealth: Payer: Self-pay | Admitting: *Deleted

## 2020-02-09 NOTE — Telephone Encounter (Signed)
I asked pt to describe the pain and she stated terrible throbbing. I told pt the ace wrap put on after the surgery is on too tight, to sit down, remove the boot, open-ended sock and ace wrap, then elevate the foot for 15 minutes, if pain worsens then dangle for 15 minutes, after 15 minutes either way place foot level with the hip and beginning at the toes rewrap the ace gently rolling down the foot and up the leg replace the sock and boot. Pt asked what to do about the blood and I told her to mark it and if it goes outside of the mark call again tomorrow morning. Pt sounded less anxious and stated understanding.

## 2020-02-09 NOTE — Telephone Encounter (Signed)
Pt called stating that her pain medication needed approval her DOS 02/08/20 pt states there is another pain medication that her primary dr fills for her and now the pharmacy needs an approval on current medication sent for the surgry. Please advise

## 2020-02-09 NOTE — Telephone Encounter (Signed)
I spoke with [facility] Jungling states pt is in Pain Management program and she spoke with pt's Dr. Sabas Sous and he prescribed the pain medication for pt.

## 2020-02-09 NOTE — Telephone Encounter (Signed)
Pt states her foot is killing her and she is taking subutex and she is a special pt and the pain medication is not working for her.

## 2020-02-09 NOTE — Telephone Encounter (Signed)
Pt says oxyCODONE-acetaminophen (PERCOCET) 5-325 MG isn't working. Please advise.

## 2020-02-09 NOTE — Telephone Encounter (Signed)
Answered at 4:53pm in another 02/09/2020 message.

## 2020-02-10 ENCOUNTER — Encounter: Payer: Medicaid Other | Admitting: Podiatry

## 2020-02-10 ENCOUNTER — Other Ambulatory Visit: Payer: Self-pay

## 2020-02-10 ENCOUNTER — Ambulatory Visit (INDEPENDENT_AMBULATORY_CARE_PROVIDER_SITE_OTHER): Payer: Medicaid Other | Admitting: Podiatry

## 2020-02-10 VITALS — Temp 97.3°F

## 2020-02-10 DIAGNOSIS — R600 Localized edema: Secondary | ICD-10-CM

## 2020-02-10 DIAGNOSIS — M2012 Hallux valgus (acquired), left foot: Secondary | ICD-10-CM | POA: Diagnosis not present

## 2020-02-17 ENCOUNTER — Encounter: Payer: Medicaid Other | Admitting: Podiatry

## 2020-02-17 ENCOUNTER — Other Ambulatory Visit: Payer: Self-pay

## 2020-02-17 ENCOUNTER — Ambulatory Visit (INDEPENDENT_AMBULATORY_CARE_PROVIDER_SITE_OTHER): Payer: Medicaid Other | Admitting: Podiatry

## 2020-02-17 DIAGNOSIS — M21612 Bunion of left foot: Secondary | ICD-10-CM

## 2020-02-17 DIAGNOSIS — M21611 Bunion of right foot: Secondary | ICD-10-CM

## 2020-02-17 DIAGNOSIS — Z9889 Other specified postprocedural states: Secondary | ICD-10-CM

## 2020-02-17 DIAGNOSIS — M2012 Hallux valgus (acquired), left foot: Secondary | ICD-10-CM

## 2020-02-19 ENCOUNTER — Encounter: Payer: Self-pay | Admitting: Podiatry

## 2020-02-19 NOTE — Progress Notes (Signed)
Subjective:  Patient ID: Tiffany Salazar, female    DOB: [birth year],  MRN: [id number]  Chief Complaint  Patient presents with   Routine Post Op    POV #2 DOS 02/08/20 AUSTIN BUNIONECTOMY LT, TAILORS BUNION LT, HAMMERTOE 5TH LT, EXC PERM NAIL B/L/DR PRICE PT      39 y.o. female returns for post-op check.  Patient states that she is doing well.  She still in a lot of pain.  Pain scale is 1 out of 10.  The cam boot is helping.  She get the foot wet earlier in the shower however she dried up really well.  She denies any other acute complaints.  She is just here to make sure that there is nothing infected.  She has known to Dr. Samuella Cota.  Review of Systems: Negative except as noted in the HPI. Denies N/V/F/Ch.  Past Medical History:  Diagnosis Date   AMA (advanced maternal age) multigravida 35+, unspecified trimester 09/20/2018   Hepatitis C    MRSA infection 03/21/2015   Pregnancy complicated by subutex maintenance, antepartum (HCC) 10/20/2018   Subdural hematoma (HCC) 12/26/2013   Substance use disorder    Tricuspid valve replaced    Unwanted fertility 02/09/2019   Signed BTL papers    Current Outpatient Medications:    amoxicillin (AMOXIL) 500 MG tablet, Take 500 mg by mouth as directed. Take 4 tablets one hour prior to dental procedure, Disp: , Rfl:    Brexpiprazole (REXULTI) 4 MG TABS, Take 4 mg by mouth daily., Disp: , Rfl:    buprenorphine (SUBUTEX) 8 MG SUBL SL tablet, Place 8 mg under the tongue 3 (three) times daily. , Disp: , Rfl:    cephALEXin (KEFLEX) 500 MG capsule, Take 1 capsule (500 mg total) by mouth 2 (two) times daily., Disp: 14 capsule, Rfl: 0   citalopram (CELEXA) 20 MG tablet, Take 20 mg by mouth daily., Disp: , Rfl:    modafinil (PROVIGIL) 200 MG tablet, Take 200 mg by mouth 2 (two) times daily., Disp: , Rfl:    ondansetron (ZOFRAN) 4 MG tablet, Take 1 tablet (4 mg total) by mouth every 8 (eight) hours as needed for nausea or vomiting., Disp: 20 tablet, Rfl:  0   oxyCODONE-acetaminophen (PERCOCET) 5-325 MG tablet, Take 1 tablet by mouth every 4 (four) hours as needed for severe pain., Disp: 20 tablet, Rfl: 0   polyethylene glycol (MIRALAX) packet, Take 17 g by mouth 2 (two) times daily. May increase to three times a day if no result, Disp: 14 each, Rfl: 5   senna-docusate (SENOKOT-S) 8.6-50 MG tablet, Take 2 tablets by mouth at bedtime as needed for mild constipation or moderate constipation., Disp: 30 tablet, Rfl: 2   VENTOLIN HFA 108 (90 Base) MCG/ACT inhaler, INHALE TWO PUFFS BY MOUTH EVERY 6 HOURS AS NEEDED FOR WHEEZING OR SHORTNESS OF BREATH, Disp: 18 g, Rfl: 1  Social History   Tobacco Use  Smoking Status Current Every Day Smoker   Packs/day: 1.00   Types: E-cigarettes  Smokeless Tobacco Current User  Tobacco Comment   quit smoking cigarretted; vape    Allergies  Allergen Reactions   Gentamicin Rash   Vancomycin Rash   Objective:  There were no vitals filed for this visit. There is no height or weight on file to calculate BMI. Constitutional Well developed. Well nourished.  Vascular Foot warm and well perfused. Capillary refill normal to all digits.   Neurologic Normal speech. Oriented to person, place, and time. Epicritic sensation to  light touch grossly present bilaterally.  Dermatologic Skin healing well without signs of infection. Skin edges well coapted without signs of infection.  Orthopedic: Tenderness to palpation noted about the surgical site.   Radiographs: None Assessment:   1. Bilateral bunions   2. Hallux valgus with bunions of left foot   3. Status post foot surgery    Plan:  Patient was evaluated and treated and all questions answered.  S/p foot surgery left -Progressing as expected post-operatively. -XR: None -WB Status: Weightbearing as tolerated in cam boot -Sutures: Intact.  No signs of dehiscence noted.  No clinical signs of infection noted. -Medications: None -Foot redressed.  No  follow-ups on file.

## 2020-02-19 NOTE — Progress Notes (Signed)
°  Subjective:  Patient ID: Tiffany Salazar, female    DOB: June 23, 1981,  MRN: [id number]  Chief Complaint  Patient presents with   Post-op Problem    POV #1 DOS 02/08/20 AUSTIN BUNIONECTOMY LT, TAILORS BUNION LT, HAMMERTOE 5TH LT, EXC PERM NAIL B/L. Pt stated, "10/10 pain. I'm taking ibuprofen 600mg  BID-TID and 4 Percocets daily. Saw bright red blood through my bandages yesterday. Swelling. Nurse told me to unwrap my Ace wrap for 15 minutes at a time. Minimal walking, but I've had to use stairs. I borrowed someone's crutches".    DOS: 02/08/20 Procedure: Austin Bunionectomy left, tailor's bunionectomy, 5th hammertoe correction, bilateral 1st toe nail excision  39 y.o. female presents with the above complaint. History confirmed with patient.   Objective:  Physical Exam: tenderness at the surgical site, local edema noted and calf supple, nontender. Incision: healing well, no significant drainage, no dehiscence, no significant erythema, slight clear drainage present. Nail sites bilat with draining ss drainage erythema without warmth.   Assessment:   1. Localized edema     Plan:  Patient was evaluated and treated and all questions answered.  Post-operative State -Dressing applied consisting of telfa unna boot, cast padding, ACE bandage for reduction of edema -WBAT in CAM boot  No follow-ups on file.

## 2020-02-24 ENCOUNTER — Ambulatory Visit (INDEPENDENT_AMBULATORY_CARE_PROVIDER_SITE_OTHER): Payer: Medicaid Other | Admitting: Podiatry

## 2020-02-24 ENCOUNTER — Ambulatory Visit (INDEPENDENT_AMBULATORY_CARE_PROVIDER_SITE_OTHER): Payer: Medicaid Other

## 2020-02-24 ENCOUNTER — Encounter: Payer: Medicaid Other | Admitting: Podiatry

## 2020-02-24 ENCOUNTER — Other Ambulatory Visit: Payer: Self-pay | Admitting: Podiatry

## 2020-02-24 ENCOUNTER — Other Ambulatory Visit: Payer: Self-pay

## 2020-02-24 DIAGNOSIS — M21612 Bunion of left foot: Secondary | ICD-10-CM

## 2020-02-24 DIAGNOSIS — Z9889 Other specified postprocedural states: Secondary | ICD-10-CM

## 2020-02-24 DIAGNOSIS — M21611 Bunion of right foot: Secondary | ICD-10-CM

## 2020-02-24 DIAGNOSIS — M2012 Hallux valgus (acquired), left foot: Secondary | ICD-10-CM

## 2020-02-24 NOTE — Progress Notes (Signed)
  Subjective:  Patient ID: Tiffany Salazar, female    DOB: June 30, 1981,  MRN: [id number]  Chief Complaint  Patient presents with  . Routine Post Op    POV#3 DOS 6.16.2021 AUSTIN BUNIONECTOMY LT, TAILOR'S BUNION LT, HAMMERTOE 5TH LT, EXC NAIL PERM B/L. Pt states no concerns and denies constitutional symptoms. "My skin is white(lateral foot). Pt states not much pain today, pain medication is effective.     DOS: 02/08/20 Procedure: Austin Bunionectomy left, tailor's bunionectomy, 5th hammertoe correction, bilateral 1st toe nail excision  39 y.o. female presents with the above complaint. History confirmed with patient.   Objective:  Physical Exam: tenderness at the surgical site, local edema noted and calf supple, nontender. Incision: healing well medially, small area laterally with partial dehiscence without signs of infeciton. Nail sites healing well with intact soft crust.  Assessment:   1. Bilateral bunions   2. Post-operative state     Plan:  Patient was evaluated and treated and all questions answered.  Post-operative State -XR reviewed. Healing well no signs of infection. -Has some wound area at lateral foot from injury from his daughter -Dressed with povidone and band-aid. Ok to shower and dress with  No follow-ups on file.

## 2020-03-02 ENCOUNTER — Other Ambulatory Visit: Payer: Self-pay | Admitting: Podiatry

## 2020-03-02 ENCOUNTER — Telehealth: Payer: Self-pay | Admitting: Podiatry

## 2020-03-02 MED ORDER — CEPHALEXIN 500 MG PO CAPS: 500.0000 mg | ORAL_CAPSULE | Freq: Two times a day (BID) | ORAL | 0 refills | Status: DC

## 2020-03-02 NOTE — Addendum Note (Signed)
Addended by: Alphia Kava D on: 03/02/2020 03:32 PM   Modules accepted: Orders

## 2020-03-02 NOTE — Telephone Encounter (Signed)
Pt called back following up on previous request for antibiotics.   Please give patient a call

## 2020-03-02 NOTE — Telephone Encounter (Signed)
Pt called stated that she had a ingrown removed prior to surgery and now she states that she feelds like that toe is getting infected she had prev open heart  So she just wanted a antibiotic called to to be on the safe side

## 2020-03-02 NOTE — Telephone Encounter (Addendum)
I spoke with pt and she states that soaking, right near the cuticle has become red and puffy, was looking good for a while now started the red and puffy. Pt states she did cut the corner and the oozing place is at the cuticle. I told pt I would get orders from Dr. Samuella Cota and I would transfer her to a scheduler to get in earlier than 03/06/2020.

## 2020-03-05 ENCOUNTER — Encounter: Payer: Medicaid Other | Admitting: Podiatry

## 2020-03-09 ENCOUNTER — Ambulatory Visit (INDEPENDENT_AMBULATORY_CARE_PROVIDER_SITE_OTHER): Payer: Medicaid Other

## 2020-03-09 ENCOUNTER — Other Ambulatory Visit: Payer: Self-pay

## 2020-03-09 ENCOUNTER — Ambulatory Visit (INDEPENDENT_AMBULATORY_CARE_PROVIDER_SITE_OTHER): Payer: Medicaid Other | Admitting: Podiatry

## 2020-03-09 ENCOUNTER — Encounter: Payer: Medicaid Other | Admitting: Podiatry

## 2020-03-09 DIAGNOSIS — M2042 Other hammer toe(s) (acquired), left foot: Secondary | ICD-10-CM

## 2020-03-09 DIAGNOSIS — Z9889 Other specified postprocedural states: Secondary | ICD-10-CM

## 2020-03-09 NOTE — Progress Notes (Signed)
  Subjective:  Patient ID: Tiffany Salazar, female    DOB: 12/10/1980,  MRN: [id number]  Chief Complaint  Patient presents with  . Routine Post Op    [facility] DOS 6.16.2021 Left foot correction of bunion and tailor's bunion with cutting and shifting of bone, correction of 5th toe hammertoe, removal ingrown nail both borders. Pt states left lateral wound is concerning due to it being stepped on previously. Denies fever/chills/nausea/vomiting.    DOS: 02/08/20 Procedure: Austin Bunionectomy left, tailor's bunionectomy, 5th hammertoe correction, bilateral 1st toe nail excision  39 y.o. female presents with the above complaint. History confirmed with patient.  Objective:  Physical Exam: tenderness at the surgical site, local edema noted and calf supple, nontender. Incision: healing well medially, partial dehiscence without signs of infeciton. Nail sites healing well with intact soft crust.  Assessment:   1. Hammer toe of left foot   2. Post-operative state     Plan:  Patient was evaluated and treated and all questions answered.  Post-operative State -XR reviewed. Osteotomies all healed. -Transition to surgical shoe. Surgical shoe offered. Patient declined due to non-coverage. -Has some wound area at lateral foot. Start Wet to dry dressings. Continue soaking daily. F/u in 2 weeks for wound check.  Return in about 2 weeks (around 03/23/2020) for Post-Op (No XRs).

## 2020-03-22 ENCOUNTER — Ambulatory Visit (INDEPENDENT_AMBULATORY_CARE_PROVIDER_SITE_OTHER): Payer: Medicaid Other | Admitting: Podiatry

## 2020-03-22 ENCOUNTER — Other Ambulatory Visit: Payer: Self-pay

## 2020-03-22 DIAGNOSIS — M2042 Other hammer toe(s) (acquired), left foot: Secondary | ICD-10-CM

## 2020-03-22 DIAGNOSIS — Z9889 Other specified postprocedural states: Secondary | ICD-10-CM

## 2020-03-22 NOTE — Progress Notes (Signed)
  Subjective:  Patient ID: Tiffany Salazar, female    DOB: 1981/02/22,  MRN: [id number]  Chief Complaint  Patient presents with  . Routine Post Op    POV #5 DOS 02/08/20 [place] BUNIONECTOMY LT, TAILORS BUNION LT, HAMMERTOE 5TH LT, EXC PERM NAIL B/L. Pt states that she is feeling alot bette since the last time she was here. But is concerned that the top of her left foot is painful and will not go away.    DOS: 02/08/20 Procedure: [place] Bunionectomy left, tailor's bunionectomy, 5th hammertoe correction, bilateral 1st toe nail excision  39 y.o. female presents with the above complaint. History confirmed with patient.  Objective:  Physical Exam: tenderness at the surgical site, local edema noted and calf supple, nontender. Incision: healed medially,dehischence laterally almost healed. No signs of infeciton. Nail sites healing well with intact soft crust.  Assessment:   1. Hammer toe of left foot   2. Post-operative state     Plan:  Patient was evaluated and treated and all questions answered.  Post-operative State -Wounds healing -WBAT in surgical shoe -Continue abx ointment and band-aid after soaking daily. -Transition to normal shoegear in 2 weeks.  Return in about 4 weeks (around 04/19/2020) for Post-Op (with XRs).

## 2020-04-07 IMAGING — DX DG CHEST 1V PORT
1 series · 1 of 1 positions shown · non-contrast
Comparison: 03/24/2018 CT of the chest

CLINICAL DATA: Chest pain

EXAM:
PORTABLE CHEST 1 VIEW

[chest ap]
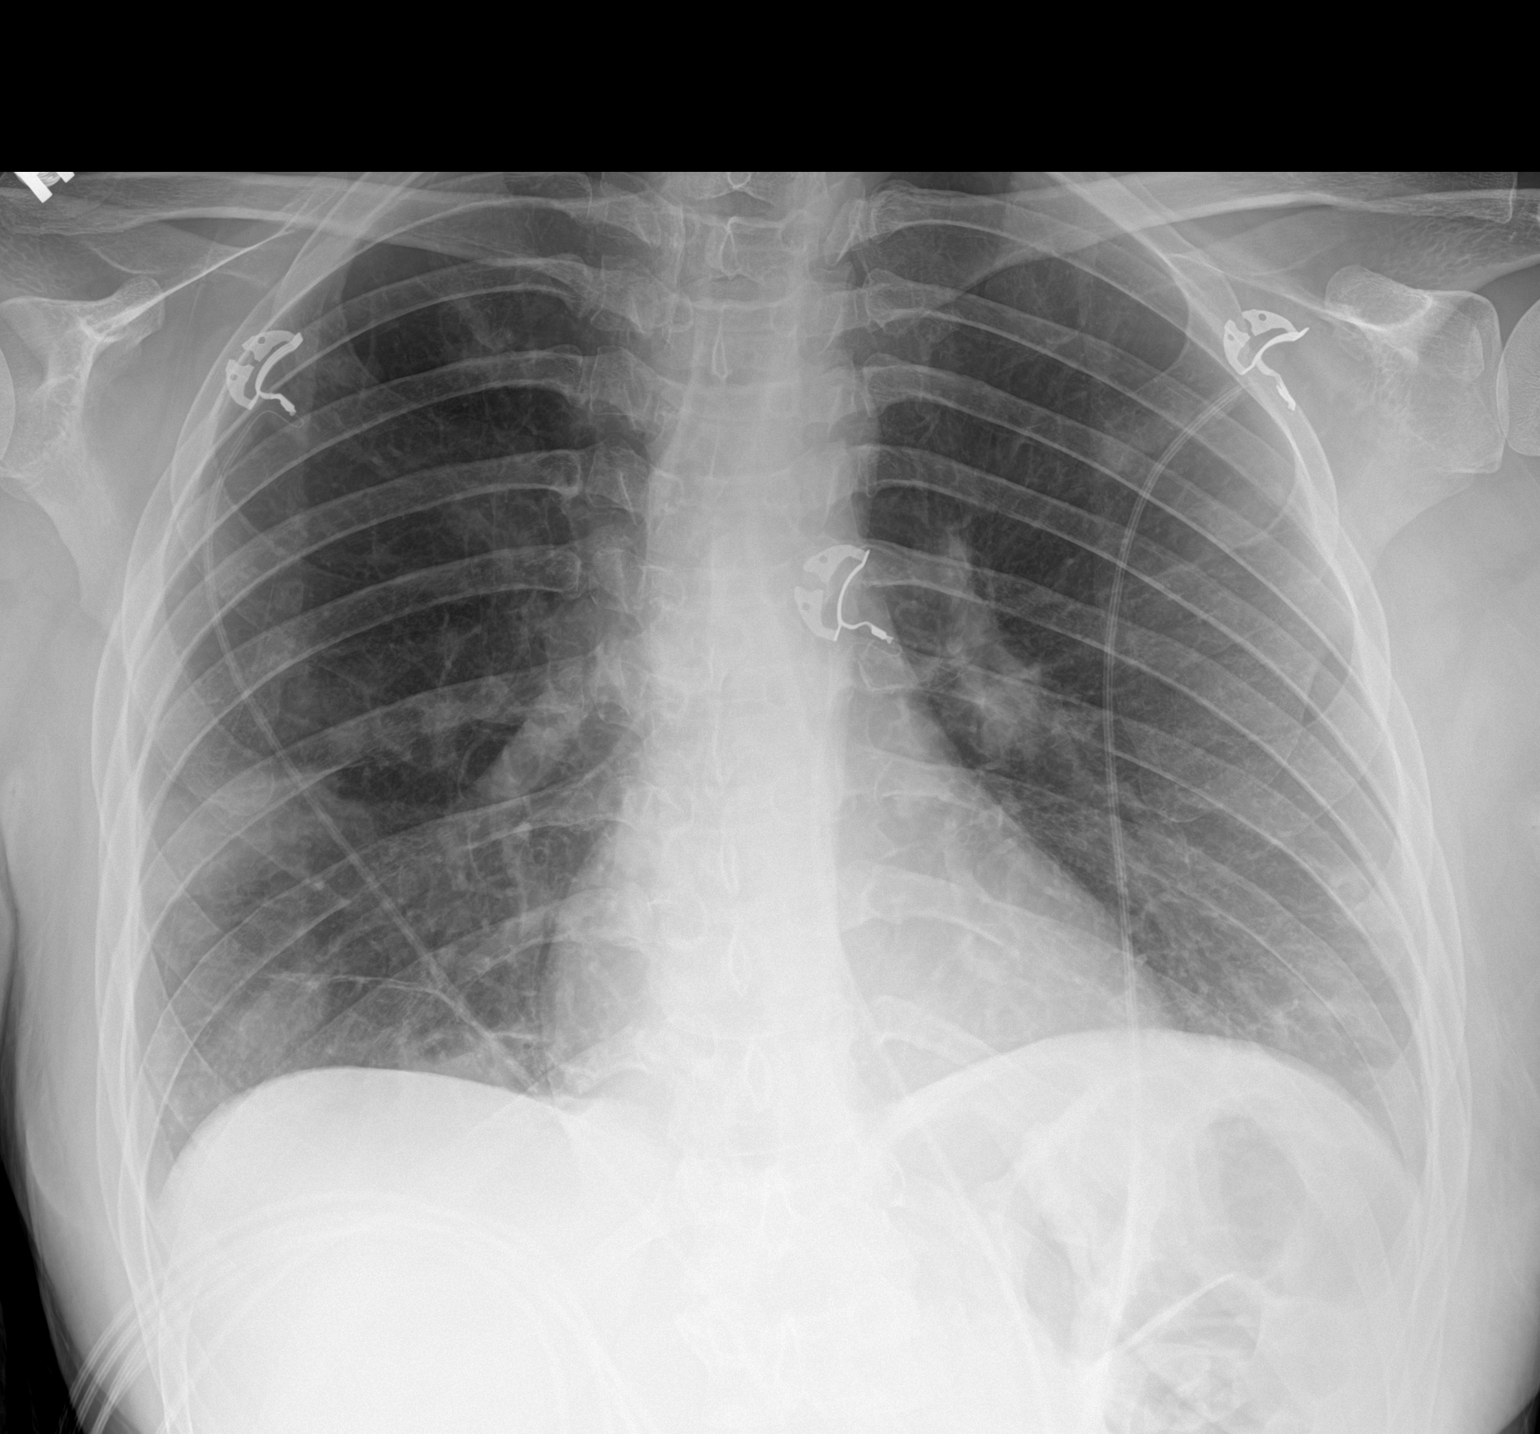

[1 of 1 positions shown; findings below may reference images not displayed]

FINDINGS: Cardiac shadows within normal limits. Patchy densities are again
identified throughout both lungs with some degree of cavitation
similar to that seen on the prior exam. No new focal infiltrate or
effusion is seen. No bony abnormality is noted.
IMPRESSION: Stable cavitary lesions bilaterally.

## 2020-04-24 ENCOUNTER — Ambulatory Visit (INDEPENDENT_AMBULATORY_CARE_PROVIDER_SITE_OTHER): Payer: Medicaid Other

## 2020-04-24 ENCOUNTER — Ambulatory Visit (INDEPENDENT_AMBULATORY_CARE_PROVIDER_SITE_OTHER): Payer: Medicaid Other | Admitting: Podiatry

## 2020-04-24 ENCOUNTER — Other Ambulatory Visit: Payer: Self-pay

## 2020-04-24 DIAGNOSIS — M2042 Other hammer toe(s) (acquired), left foot: Secondary | ICD-10-CM | POA: Diagnosis not present

## 2020-04-24 MED ORDER — METHYLPREDNISOLONE 4 MG PO TBPK
ORAL_TABLET | ORAL | 0 refills | Status: DC
Start: 2020-04-24 — End: 2020-06-06

## 2020-04-24 NOTE — Progress Notes (Signed)
  Subjective:  Patient ID: Tiffany Salazar, female    DOB: 05-Feb-1981,  MRN: [id number]  Chief Complaint  Patient presents with  . Routine Post Op    POV DOS 02/08/2020 Pt states she is still having continued pain and swelling.    DOS: 02/08/20 Procedure: [place] Bunionectomy left, tailor's bunionectomy, 5th hammertoe correction, bilateral 1st toe nail excision  39 y.o. female presents with the above complaint. History confirmed with patient.  Objective:  Physical Exam: no tenderness at the surgical site, local edema noted and calf supple, nontender. POP centrally, no pain at the 1st or 5th metatarsals. Incision: immature healing both incisions with slight scabbing.  Assessment:   1. Hammer toe of left foot     Plan:  Patient was evaluated and treated and all questions answered.  Post-operative State -Wounds continue to mature -Edema left forefoot centrally, not at either metatarsal -Rx medrol pack for reduction of edema  Plan for surgical discussion right foot next visit. No XR needed.  Return in about 4 weeks (around 05/22/2020) for Post-Op (No XRs).

## 2020-05-17 DIAGNOSIS — Z23 Encounter for immunization: Secondary | ICD-10-CM | POA: Diagnosis not present

## 2020-05-22 ENCOUNTER — Encounter: Payer: Medicaid Other | Admitting: Podiatry

## 2020-05-25 ENCOUNTER — Ambulatory Visit (INDEPENDENT_AMBULATORY_CARE_PROVIDER_SITE_OTHER): Payer: Medicaid Other | Admitting: Podiatry

## 2020-05-25 ENCOUNTER — Other Ambulatory Visit: Payer: Self-pay

## 2020-05-25 DIAGNOSIS — M2011 Hallux valgus (acquired), right foot: Secondary | ICD-10-CM | POA: Diagnosis not present

## 2020-05-25 DIAGNOSIS — M21961 Unspecified acquired deformity of right lower leg: Secondary | ICD-10-CM

## 2020-05-25 DIAGNOSIS — M21621 Bunionette of right foot: Secondary | ICD-10-CM

## 2020-05-25 DIAGNOSIS — Z720 Tobacco use: Secondary | ICD-10-CM

## 2020-05-25 DIAGNOSIS — M2031 Hallux varus (acquired), right foot: Secondary | ICD-10-CM

## 2020-05-28 ENCOUNTER — Encounter: Payer: Self-pay | Admitting: Podiatry

## 2020-05-28 NOTE — Progress Notes (Signed)
  Subjective:  Patient ID: Tiffany Salazar, female    DOB: 08/22/1981,  MRN: [id number]   DOS: 02/08/20 Procedure: [place] Bunionectomy left, tailor's bunionectomy, 5th hammertoe correction, bilateral 1st toe nail excision  No chief complaint on file.   38 y.o. female presents with the above complaint. History confirmed with patient. Feeling very good with the left foot, would like to consider surgical intervention for the right foot.  Objective:  Physical Exam: warm, good capillary refill, no trophic changes or ulcerative lesions, normal DP and PT pulses and normal sensory exam. Left Foot: well healed, good position 1st/5th toe and metatarsals without POP or Pain on ROM  Right Foot: Hallux valgus, tailor's bunion, lesser digital contractures. POP medial and lateral bunions with POP 4th/5th toes   Assessment:   1. Hallux valgus, right   2. Tailor's bunionette, right   3. Hallux hammertoe, right   4. Tobacco abuse   5. Metatarsal deformity, right      Plan:  Patient was evaluated and treated and all questions answered.  Hallux Valgus, Tailor's Bunionectomy, Hammertoes Right -Prior XR reviewed with patient -Patient has failed all conservative therapy and wishes to proceed with surgical intervention. All risks, benefits, and alternatives discussed with patient. No guarantees given. Consent reviewed and signed by patient. -Planned procedures: Correction right foot bunion, tailor's bunion, correction 4th/5th hammertoe, possible 2nd met shortening ostoetomy -Risk factors: smoking status.  No follow-ups on file.

## 2020-06-06 ENCOUNTER — Other Ambulatory Visit: Payer: Self-pay

## 2020-06-06 ENCOUNTER — Ambulatory Visit (INDEPENDENT_AMBULATORY_CARE_PROVIDER_SITE_OTHER): Payer: Medicaid Other | Admitting: Family Medicine

## 2020-06-06 DIAGNOSIS — R599 Enlarged lymph nodes, unspecified: Secondary | ICD-10-CM | POA: Diagnosis not present

## 2020-06-06 NOTE — Patient Instructions (Signed)
Good to see you today!  Thanks for coming in.  You have a lymph node behind your ear.  It is swollen due to infection and the nickel allergy.  Use warm compress as needed  It should go away in the next 3-4 weeks.  If not come back

## 2020-06-06 NOTE — Progress Notes (Signed)
    SUBJECTIVE:   CHIEF COMPLAINT / HPI:   LUMP BEHIND R EAR Has been present more than a week.  Tried to pop squeeze it but no discharge.  Has been having allergic reaction to ear rings and had an infection in one hole.   No scalp problems.  No fever  No other lumps  PERTINENT  PMH / PSH: multiple ear piercings  OBJECTIVE:   BP 115/65   Pulse 66   Ht 5\' 8"  (1.727 m)   Wt 225 lb 3.2 oz (102.2 kg)   SpO2 98%   BMI 34.24 kg/m   R ear - 4 piercings two with rings. No active redness or discharge Posterior to ear is a 0.7 cm firm smooth nontender lymph node Thick hair with normal scalp Neck:  No deformities, thyromegaly, masses, or tenderness noted.   Supple with full range of motion without pain.  No other nodes noted   ASSESSMENT/PLAN:   Reactive lymphadenopathy Posterior auricular LN likely due to ear irritation from ear rings.  No active infection - monitor for resolution      , MD [facility]

## 2020-06-06 NOTE — Assessment & Plan Note (Signed)
Posterior auricular LN likely due to ear irritation from ear rings.  No active infection - monitor for resolution

## 2020-06-18 ENCOUNTER — Telehealth: Payer: Self-pay | Admitting: Cardiovascular Disease

## 2020-06-18 NOTE — Telephone Encounter (Signed)
   [facility] Pre-operative Risk Assessment    HEARTCARE STAFF: - Please ensure there is not already an duplicate clearance open for this procedure. - Under Visit Info/Reason for Call, type in Other and utilize the format Clearance MM/DD/YY or Clearance TBD. Do not use dashes or single digits. - If request is for dental extraction, please clarify the # of teeth to be extracted.  Request for surgical clearance:  1. What type of surgery is being performed? Liposuction and skin removal   2. When is this surgery scheduled? 06/27/20  3. What type of clearance is required (medical clearance vs. Pharmacy clearance to hold med vs. Both)? Both   4. Are there any medications that need to be held prior to surgery and how long? Not that they are aware of   5. Practice name and name of physician performing surgery? [facility], Dr. Ulis Rias   6. What is the office phone number? [phone]   7.   What is the office fax number? [phone]  8.   Anesthesia type (None, local, MAC, general) ? N/A   Trilby Drummer 06/18/2020, 3:09 PM  _________________________________________________________________   (provider comments below)

## 2020-06-19 ENCOUNTER — Telehealth: Payer: Self-pay | Admitting: Cardiovascular Disease

## 2020-06-19 ENCOUNTER — Telehealth: Payer: Self-pay | Admitting: Family Medicine

## 2020-06-19 ENCOUNTER — Telehealth: Payer: Self-pay

## 2020-06-19 NOTE — Telephone Encounter (Signed)
She is 39 and has no CAD, low risk.  Unless she has new or unstable symptoms such as shortness of breath, edema, or chest pain, she can proceed with surgery without an appointment.

## 2020-06-19 NOTE — Telephone Encounter (Signed)
Patient is calling in because she requesting surgical clearance prior to a liposuction procedure that she is having done on 06/27/20. Peggyann Juba, PA advised to have patient move procedure in order to see Dr. Duke Salvia at her yearly OV on 06/29/20 prior to having this procedure done.   Patient states that she has already made arrangements such as taking off work, childcare for her children etc and is requesting that Dr. Duke Salvia approve her sx clearance without being seen in office first. Patient states that she will still be able to come to her appointment on 11/5 but really does not want to reschedule her procedure if at all possible. I let the patient know that I would send to Dr. Duke Salvia and her RN for further advisement. Patient requesting a call from Dr. Duke Salvia so she can explain that the procedure is minimal and does not require anesthesia.

## 2020-06-19 NOTE — Telephone Encounter (Signed)
I called and left a message for [facility] to return call about moving procedure until after 06/29/20 when patient sees Dr. Duke Salvia.

## 2020-06-19 NOTE — Telephone Encounter (Signed)
Received two forms for surgical clearance for [facility]. Please schedule patient for appointment with me or team member for risk stratification.  Form in base of my box.   Terisa Starr, MD  Family Medicine Teaching Service

## 2020-06-19 NOTE — Telephone Encounter (Signed)
Called patient to make appointment for risk stratification per Dr. Manson Passey.  Patient stated that she already had appointment before I could even tell her what it was for.  Glennie Hawk, CMA

## 2020-06-19 NOTE — Telephone Encounter (Signed)
Spoke with patient, she denies any shortness of breath/chest pain/swelling and stated she was feeling good Advised of Dr Leonides Sake recommendations Will fax to requesting office

## 2020-06-19 NOTE — Telephone Encounter (Signed)
Patient is calling about her clearance, she would like to know if Dr. Duke Salvia would sign off on her clearance without her been seen.  Her surgery is scheduled for 06/29/20

## 2020-06-19 NOTE — Telephone Encounter (Signed)
Left patient a message to call back regarding her surgical clearance.

## 2020-06-19 NOTE — Telephone Encounter (Signed)
We received a request for a cardiac evaluation for Liposuction and skin removal scheduled for 06/27/20. This patient has a year follow-up 06/29/20 with Dr. Duke Salvia. Since this procedure is clearly elective please call office of Dr. Cala Bradford [facility] and request the procedure to moved until after her 11/5 appointment, where Dr. Duke Salvia can address pre-operative risk assessment.   Thank you,   Zikeria Keough Fransico Michael, PA-C

## 2020-06-20 NOTE — Telephone Encounter (Signed)
Patient returning our call and doesn't understand why she needs this appointment for the papers to be signed. Advised patient the appointment is required per Dr. Theora Gianotti phone note.   Patient did make an appointment with Dr. Neita Garnet for October 27th at 11:25 am.

## 2020-06-20 NOTE — Telephone Encounter (Signed)
   Primary Cardiologist: Chilton Si, MD  Chart reviewed as part of pre-operative protocol coverage. Patient last seen 07/03/20 and was stable from a cardiac standpoint. Per Dr. Duke Salvia patient does not need to be seen prior to procedure given no CAD(low risk)  as long as she is asymptomatic. Patient was called and she denied chest pain, shortness of breath, lower leg swelling, and other acute symptoms. Given past medical history and time since last visit, based on ACC/AHA guidelines, Tiffany Salazar would be at acceptable risk for the planned procedure without further cardiovascular testing.   The patient was advised that if she develops new symptoms prior to surgery to contact our office to arrange for a follow-up visit, and she verbalized understanding.  I will route this recommendation to the requesting party via Epic fax function and remove from pre-op pool.  Please call with questions.  Bridgit Eynon David Stall, PA-C 06/20/2020, 8:00 AM

## 2020-06-20 NOTE — Telephone Encounter (Signed)
   Primary Cardiologist: Chilton Si, MD  Chart reviewed as part of pre-operative protocol coverage. Patient last seen 07/03/20 and was stable from a cardiac standpoint. Per Dr. Duke Salvia patient does not need to be seen prior to procedure given no CAD(low risk)  as long as she is asymptomatic. Patient was called and she denied chest pain, shortness of breath, lower leg swelling, and other acute symptoms. Given past medical history and time since last visit, based on ACC/AHA guidelines, ALANTIS BETHUNE would be at acceptable risk for the planned procedure without further cardiovascular testing.   The patient was advised that if she develops new symptoms prior to surgery to contact our office to arrange for a follow-up visit, and she verbalized understanding.  I will route this recommendation to the requesting party via Epic fax function and remove from pre-op pool.  Please call with questions.  Ludwig Tugwell David Stall, PA-C 06/20/2020, 7:55 AM

## 2020-06-22 ENCOUNTER — Encounter: Payer: Self-pay | Admitting: Family Medicine

## 2020-06-22 ENCOUNTER — Other Ambulatory Visit: Payer: Self-pay

## 2020-06-22 ENCOUNTER — Ambulatory Visit (INDEPENDENT_AMBULATORY_CARE_PROVIDER_SITE_OTHER): Payer: Medicaid Other | Admitting: Family Medicine

## 2020-06-22 VITALS — BP 108/62 | HR 98 | Wt 225.2 lb

## 2020-06-22 DIAGNOSIS — I079 Rheumatic tricuspid valve disease, unspecified: Secondary | ICD-10-CM

## 2020-06-22 DIAGNOSIS — Z01818 Encounter for other preprocedural examination: Secondary | ICD-10-CM

## 2020-06-22 DIAGNOSIS — R635 Abnormal weight gain: Secondary | ICD-10-CM

## 2020-06-22 NOTE — Patient Instructions (Signed)
Thank you for choosing [facility] for your care today. Today we discussed risk assessment prior to undergoing elective surgery.  I have completed the paperwork and will provide a copy to the surgical institution via fax. We will also complete lab work to check electrolytes, kidney and liver function as well as blood count and your thyroid. I will contact you if there are any abnormal labs.  If you develop any chest pain, difficulty breathing, fevers or chills; it is recommended that you hold off from undergoing this procedure due to your history of endocarditis and they get increased risk of postoperative or intraoperative complications.  Please use the revised cardiac risk index for preoperative risk that showed a 3.9% risk of death, heart attack or cardiac arrest within 30 days of your procedure.  This is considered a class I risk.  We also calculated the [facility] perioperative risk for heart attack or cardiac arrest that showed a score of 0%.

## 2020-06-22 NOTE — Progress Notes (Signed)
    SUBJECTIVE:   CHIEF COMPLAINT / HPI: preoperative risk stratification   Patient presents today for preoperative assessment. 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 Patient is planning to have liposuction and pannus removal on 06/27/20. She reports that her cardiologist has given her the "OK" to have the procedure with follow up planned for 06/29/20. This is confirmed via chart review.   Today, she reports taking medications as prescribed.  She denies any symptoms of chest pain, SOB, hx of allergic rxn to anesthesia nor family hx of malignant hyperthermia.  Patient reports that she has not used illicit substances.  Last cardiology appt was Nov of 2020 for annual check up  Patient requests to have thyroid checked due to family hx of hypothyroidism.  Has stopped taking ASA  Reviewed current medications   PERTINENT  PMH / PSH:  Hx of endocarditis 2/2 to IVDU  OBJECTIVE:   BP 108/62   Pulse 98   Wt 225 lb 3.2 oz (102.2 kg)   SpO2 95%   BMI 34.24 kg/m   General: female appearing stated age in no acute distress HEENT: MMM, no oral lesions noted,Neck non-tender without lymphadenopathy Cardio: Normal S1 and S2, no S3 or S4. Rhythm is regular. Bilateral radial pulses palpable Pulm: Clear to auscultation bilaterally, no crackles, wheezing, or diminished breath sounds. Normal respiratory effort Abdomen: Bowel sounds normal. Abdomen soft and non-tender.  Extremities: No peripheral edema. Warm & well perfused.  Neuro: pt alert and oriented x4, follows commands, PERRLA, EOMI bilaterally   ASSESSMENT/PLAN:   Encounter for preoperative assessment Reviewed Chales Abrahams scoring, 0% and cardiac risk index score of 3% for risk of MI, adverse cardiovascular event within 30 days of surgery, categorizing her as low risk  - CMP  -TSH -CBC  -discussed these risks with patient who verbalized understanding  -  patient's forms completed, original given to patient and copy to be faxed to surgeon's office  -patient has been prescribed abx before procedure, recommended that she complete that as prescribed     Ronnald Ramp, MD [facility]

## 2020-06-23 ENCOUNTER — Encounter: Payer: Self-pay | Admitting: Family Medicine

## 2020-06-23 LAB — CBC WITH DIFFERENTIAL/PLATELET
Basophils Absolute: 0.1 10*3/uL (ref 0.0–0.2)
Basos: 1 %
EOS (ABSOLUTE): 0.4 10*3/uL (ref 0.0–0.4)
Eos: 6 %
Hematocrit: 39.7 % (ref 34.0–46.6)
Hemoglobin: 13.3 g/dL (ref 11.1–15.9)
Immature Grans (Abs): 0 10*3/uL (ref 0.0–0.1)
Immature Granulocytes: 0 %
Lymphocytes Absolute: 2.8 10*3/uL (ref 0.7–3.1)
Lymphs: 40 %
MCH: 31.1 pg (ref 26.6–33.0)
MCHC: 33.5 g/dL (ref 31.5–35.7)
MCV: 93 fL (ref 79–97)
Monocytes Absolute: 0.5 10*3/uL (ref 0.1–0.9)
Monocytes: 7 %
Neutrophils Absolute: 3.3 10*3/uL (ref 1.4–7.0)
Neutrophils: 46 %
Platelets: 240 10*3/uL (ref 150–450)
RBC: 4.28 x10E6/uL (ref 3.77–5.28)
RDW: 12.5 % (ref 11.7–15.4)
WBC: 7.1 10*3/uL (ref 3.4–10.8)

## 2020-06-23 LAB — COMPREHENSIVE METABOLIC PANEL
ALT: 14 IU/L (ref 0–32)
AST: 23 IU/L (ref 0–40)
Albumin/Globulin Ratio: 1.5 (ref 1.2–2.2)
Albumin: 4.1 g/dL (ref 3.8–4.8)
Alkaline Phosphatase: 105 IU/L (ref 44–121)
BUN/Creatinine Ratio: 13 (ref 9–23)
BUN: 11 mg/dL (ref 6–20)
Bilirubin Total: 0.3 mg/dL (ref 0.0–1.2)
CO2: 22 mmol/L (ref 20–29)
Calcium: 8.8 mg/dL (ref 8.7–10.2)
Chloride: 104 mmol/L (ref 96–106)
Creatinine, Ser: 0.86 mg/dL (ref 0.57–1.00)
GFR calc Af Amer: 100 mL/min/{1.73_m2} (ref >59)
GFR calc non Af Amer: 87 mL/min/{1.73_m2} (ref >59)
Globulin, Total: 2.8 g/dL (ref 1.5–4.5)
Glucose: 92 mg/dL (ref 65–99)
Potassium: 4.5 mmol/L (ref 3.5–5.2)
Sodium: 142 mmol/L (ref 134–144)
Total Protein: 6.9 g/dL (ref 6.0–8.5)

## 2020-06-23 LAB — TSH: TSH: 1.55 u[IU]/mL (ref 0.450–4.500)

## 2020-06-24 DIAGNOSIS — Z01818 Encounter for other preprocedural examination: Secondary | ICD-10-CM | POA: Insufficient documentation

## 2020-06-24 NOTE — Assessment & Plan Note (Signed)
Reviewed Chales Abrahams scoring, 0% and cardiac risk index score of 3% for risk of MI, adverse cardiovascular event within 30 days of surgery, categorizing her as low risk  - CMP  -TSH -CBC  -discussed these risks with patient who verbalized understanding  - patient's forms completed, original given to patient and copy to be faxed to surgeon's office  -patient has been prescribed abx before procedure, recommended that she complete that as prescribed

## 2020-06-29 ENCOUNTER — Ambulatory Visit: Payer: Medicaid Other | Admitting: Cardiovascular Disease

## 2020-06-29 NOTE — Progress Notes (Deleted)
Cardiology Office Note   Date:  06/29/2020   ID:  Tiffany Salazar, DOB [birth year], MRN [id number]  PCP:  Westley Chandler, MD  Cardiologist:   Chilton Si, MD   No chief complaint on file.    History of Present Illness: Tiffany Salazar is a 39 y.o. 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  Tiffany Salazar was admitted 02/2018 with tricuspid valve endocarditis.  She had a TEE that showed a 0.5 cm x 1 cm vegetation on the septal leaflet of the tricuspid valve.  There was severe tricuspid regurgitation.   She was initially treated at an outside hospital and started on vancomycin and gentamicin.  However she developed Red Man syndrome.  This was switched to daptomycin and Bactrim.  Her hospitalization was complicated by septic pulmonary emboli.  She underwent tricuspid valve replacement with a 27 mm Edwards Magna-Ease pericardial valve on 03/29/2018.  Her postoperative Salazar was relatively uncomplicated.  Tiffany Salazar followed up 04/2018 and was doing well from a cardiac standpoint.  She had a phone visit with Tiffany Course, Tiffany Salazar on 09/2018. She noted some exertional dyspnea which was attributed to pregnancy.  She had a filling without complications.  She has been doing well.  She had a daughter and has been very stressed.  She has a high Education administrator, second grader and newborn.  The older children are doing virtual school.  She has been trying to go to the gym but does not always have time.  When she does exercise she has no exertional chest pain or shortness of breath.  Last week she had an episode of lightheadedness after standing.  It lasted for a minute.  She denies any syncope.  She denies any palpitations.  She has not noted any lower extremity edema, orthopnea, or PND.  She is no longer breast-feeding.  She does note that her soda intake has been high.  She drinks a cup of coffee in the morning and then has 4 sodas  throughout the day.  She continues to abstain from illicit drug use.  She is currently in school to become a Scientist, forensic.   Past Medical History:  Diagnosis Date  . AMA (advanced maternal age) multigravida 35+, unspecified trimester 09/20/2018  . Hepatitis C   . MRSA infection 03/21/2015  . Pregnancy complicated by subutex maintenance, antepartum (HCC) 10/20/2018  . Subdural hematoma (HCC) 12/26/2013  . Substance use disorder   . Tricuspid valve replaced   . Unwanted fertility 02/09/2019   Signed BTL papers    Past Surgical History:  Procedure Laterality Date  . CESAREAN SECTION  x 3  . CESAREAN SECTION WITH BILATERAL TUBAL LIGATION Bilateral 03/29/2019   Procedure: CESAREAN SECTION WITH BILATERAL TUBAL LIGATION;  Surgeon: Allie Bossier, MD;  Location: [facility] ORS;  Service: Obstetrics;  Laterality: Bilateral;  . TEE WITHOUT CARDIOVERSION N/A 03/23/2018   Procedure: TRANSESOPHAGEAL ECHOCARDIOGRAM (TEE);  Surgeon: Chrystie Nose, MD;  Location: [facility] ENDOSCOPY;  Service: Cardiovascular;  Laterality: N/A;  . TEE WITHOUT CARDIOVERSION N/A 03/29/2018   Procedure: TRANSESOPHAGEAL ECHOCARDIOGRAM (TEE);  Surgeon: Alleen Borne, MD;  Location: [facility] OR;  Service: Open Heart Surgery;  Laterality: N/A;  . TRICUSPID VALVE REPLACEMENT N/A 03/29/2018   Procedure: TRICUSPID VALVE REPLACEMENT Using 27 mm Magna Ease Mitral Valve Pericardial Bioprosthesis;  Surgeon: Alleen Borne, MD;  Location: [facility] OR;  Service: Open Heart Surgery;  Laterality: N/A;  Current Outpatient Medications  Medication Sig Dispense Refill  . amphetamine-dextroamphetamine (ADDERALL XR) 30 MG 24 hr capsule Take 30 mg by mouth in the morning and at bedtime.    . bisacodyl (BISACODYL) 5 MG EC tablet Take 5 mg by mouth daily as needed for moderate constipation.    . Brexpiprazole (REXULTI) 4 MG TABS Take 4 mg by mouth daily.    . buprenorphine (SUBUTEX) 8 MG SUBL SL tablet Place 8 mg under the tongue 3 (three) times daily.     .  citalopram (CELEXA) 20 MG tablet Take 20 mg by mouth daily.    . modafinil (PROVIGIL) 200 MG tablet Take 200 mg by mouth 2 (two) times daily.    . polyethylene glycol (MIRALAX) packet Take 17 g by mouth 2 (two) times daily. May increase to three times a day if no result (Patient not taking: Reported on 06/22/2020) 14 each 5  . VENTOLIN HFA 108 (90 Base) MCG/ACT inhaler INHALE TWO PUFFS BY MOUTH EVERY 6 HOURS AS NEEDED FOR WHEEZING OR SHORTNESS OF BREATH 18 g 1   No current facility-administered medications for this visit.    Allergies:   Gentamicin and Vancomycin    Social History:  The patient  reports that she has been smoking e-cigarettes. She has been smoking about 1.00 pack per day. She uses smokeless tobacco. She reports previous alcohol use. She reports previous drug use. Drugs: IV, Cocaine, Heroin, and Marijuana.   Family History:  The patient's family history includes Alcohol abuse in her father; Breast cancer (age of onset: 4454) in her mother; Thyroid disease in her mother.    ROS:  Please see the history of present illness.   Otherwise, review of systems are positive for none.   All other systems are reviewed and negative.    PHYSICAL EXAM: VS:  There were no vitals taken for this visit. , BMI There is no height or weight on file to calculate BMI. GENERAL:  Well appearing HEENT:  Pupils equal round and reactive, fundi not visualized, oral mucosa unremarkable NECK:  No jugular venous distention, waveform within normal limits, carotid upstroke brisk and symmetric, no bruits, no thyromegaly LYMPHATICS:  No cervical adenopathy LUNGS:  Clear to auscultation bilaterally HEART:  RRR.  PMI not displaced or sustained,S1 and S2 within normal limits, no S3, no S4, no clicks, no rubs, no murmurs ABD:  Flat, positive bowel sounds normal in frequency in pitch, no bruits, no rebound, no guarding, no midline pulsatile mass, no hepatomegaly, no splenomegaly EXT:  2 plus pulses throughout, no  edema, no cyanosis no clubbing SKIN:  No rashes no nodules NEURO:  Cranial nerves II through XII grossly intact, motor grossly intact throughout PSYCH:  Cognitively intact, oriented to person place and time   EKG:  EKG is ordered today. 07/14/19: Sinus bradycardia.  Rate 54 bpm.     TEE 03/23/18: Study Conclusions  - Left ventricle: The cavity size was normal. Wall thickness was   normal. Systolic function was normal. The estimated ejection   fraction was in the range of 55% to 60%. Wall motion was normal;   there were no regional wall motion abnormalities. - Aortic valve: No evidence of vegetation. - Mitral valve: No evidence of vegetation. - Left atrium: No evidence of thrombus in the atrial cavity or   appendage. - Right atrium: No evidence of thrombus in the atrial cavity or   appendage. - Atrial septum: No defect or patent foramen ovale was identified. - Tricuspid  valve: Large 0.5 x 1 cm vegetations of the septal and   posterior leafelts (seen in 2D and 3D views) with associated   severe, wide-open TR. Peak RV-RA gradient (S): 88 mm Hg.   Regurgitant VTI: 88 cm. - Pulmonic valve: No evidence of vegetation.  Impressions:  - 2 large mobile vegetations of the TV with severe TR and high   embolic potential. Consider CT surgical consult for debridement   and TV replacement.  ECHO:03/10/2018: Summary:  -Clinical question: Sepsis  -Left ventricle: Size was normal. Systolic function was normal. Ejection  fraction was estimated in the range of 55 % to 60 %. There were no regional  wall motion abnormalities. Left ventricular diastolic function parameters were normal.  -Mitral valve: There was no evidence for vegetation.  -Right ventricle: The size was normal. Systolic function was normal.  Systolic pressure was within the normal range. Estimated peak pressure was at  least 24 mmHg. There was a mobile mass in the ventricular cavity. It may  represent a  vegetation.  -Tricuspid valve: There was trace regurgitation. There was echogenic mobile  mass on tricuspid valve leaflets and also in the right ventricular chamber  consistent with vegetations .  Pulmonary veins: Not well visualized.  Right ventricle: The size was normal. Systolic function was normal. Wall  thickness was normal. There was a mobile mass in the ventricular cavity. It  may represent a vegetation. Doppler: Systolic pressure was within the normal  range. Estimated peak pressure was at least 24 mmHg.  Pulmonic valve: Not well visualized.  Tricuspid valve: The valve structure was normal. There was normal leaflet  separation. There was echogenic mobile mass on tricuspid valve leaflets and  also in the right ventricular chamber consistent with vegetations . Doppler:  There was no evidence for tricuspid stenosis. There was trace regurgitation.  Right atrium: Size was normal.  Systemic veins: IVC: The inferior vena cava was normal in size and Salazar.  Respirophasic changes were normal.  Echo 04/2018: Echo 05/24/2018 LV EF: 55% - 60% Study Conclusions  - Left ventricle: The cavity size was normal. Systolic function was normal. The estimated ejection fraction was in the range of 55% to 60%. Wall motion was normal; there were no regional wall motion abnormalities. - Aortic valve: There was no significant regurgitation. - Mitral valve: There was trivial regurgitation. - Left atrium: The atrium was moderately dilated. - Right ventricle: Systolic function was normal. - Right atrium: The atrium was moderately to severely dilated. - Atrial septum: No defect or patent foramen ovale was identified. - Tricuspid valve: Prior procedures included surgical repair. A 2.7 cmbioprosthesis was present. There was trivial regurgitation. - Pulmonic valve: There was trivial regurgitation. - Pulmonary arteries: Tiffany Salazar peak pressure: 28 mm Hg (S).  Impressions:  - S/P TV  replacement. Valve appears well seated, trivial TR centrally. EF normal. No other significant valve disease.   Recent Labs: 06/22/2020: ALT 14; BUN 11; Creatinine, Ser 0.86; Hemoglobin 13.3; Platelets 240; Potassium 4.5; Sodium 142; TSH 1.550    Lipid Panel No results found for: CHOL, TRIG, HDL, CHOLHDL, VLDL, LDLCALC, LDLDIRECT    Wt Readings from Last 3 Encounters:  06/22/20 225 lb 3.2 oz (102.2 kg)  06/06/20 225 lb 3.2 oz (102.2 kg)  11/24/19 212 lb (96.2 kg)      ASSESSMENT AND PLAN:  # Tricuspid valve MRSA endocarditis: Tiffany Salazar is doing well physically.  She has no lower extremity edema, orthopnea or PND.  Her tricuspid valve was stable  on echo last year.  She does not need presurgical clearance prior to dental procedures.  Unless she is having moderate sedation or general anesthesia this is unnecessary.  She does need antibiotic prophylaxis prior to surgery.  # Polysubstance abuse:  Tiffany Salazar was congratulated on continuing to abstain from illicits.     Current medicines are reviewed at length with the patient today.  The patient does not have concerns regarding medicines.  The following changes have been made:  no change  Labs/ tests ordered today include:   No orders of the defined types were placed in this encounter.    Disposition:   FU with Braedyn Riggle C. Duke Salvia, MD, [facility] in 1 year.   Signed, Terree Gaultney C. Duke Salvia, MD, [facility]  06/29/2020 2:20 PM    [facility]

## 2020-07-02 ENCOUNTER — Ambulatory Visit: Payer: Medicaid Other | Admitting: Family Medicine

## 2020-07-10 ENCOUNTER — Encounter: Payer: Medicaid Other | Admitting: Podiatry

## 2020-07-12 ENCOUNTER — Encounter: Payer: Self-pay | Admitting: Podiatry

## 2020-07-17 ENCOUNTER — Encounter: Payer: Medicaid Other | Admitting: Podiatry

## 2020-07-24 ENCOUNTER — Encounter: Payer: Medicaid Other | Admitting: Podiatry

## 2020-07-31 ENCOUNTER — Encounter: Payer: Medicaid Other | Admitting: Podiatry

## 2020-08-21 ENCOUNTER — Encounter: Payer: Medicaid Other | Admitting: Podiatry

## 2020-08-28 ENCOUNTER — Telehealth: Payer: Self-pay | Admitting: *Deleted

## 2020-08-28 IMAGING — US US OB < 14 WEEKS - US OB TV
1 series · 15 of 28 positions shown · non-contrast
Comparison: None.

CLINICAL DATA: High-risk pregnancy in 1st trimester. Chronic
hepatitis-C and substance abuse.

EXAM:
OBSTETRIC <14 WK US AND TRANSVAGINAL OB US
TECHNIQUE: Both transabdominal and transvaginal ultrasound examinations were
performed for complete evaluation of the gestation as well as the
maternal uterus, adnexal regions, and pelvic cul-de-sac.
Transvaginal technique was performed to assess early pregnancy.

[Series 1: us ob < 14 weeks - us ob tv · 15 of 83 slices shown]
[im 1/83]
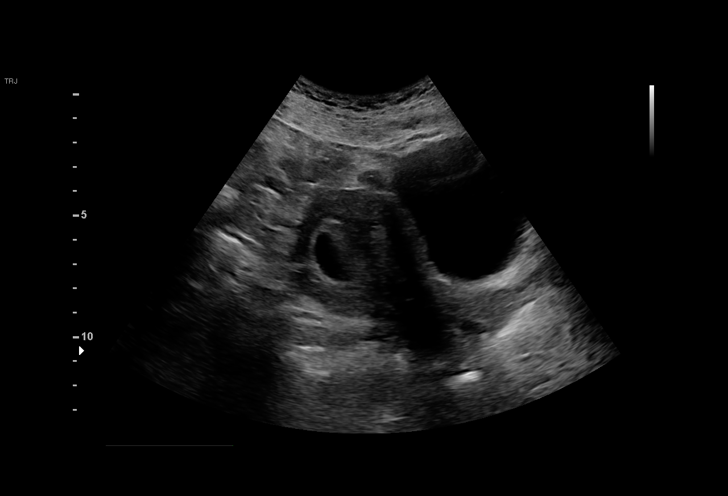
[im 7/83]
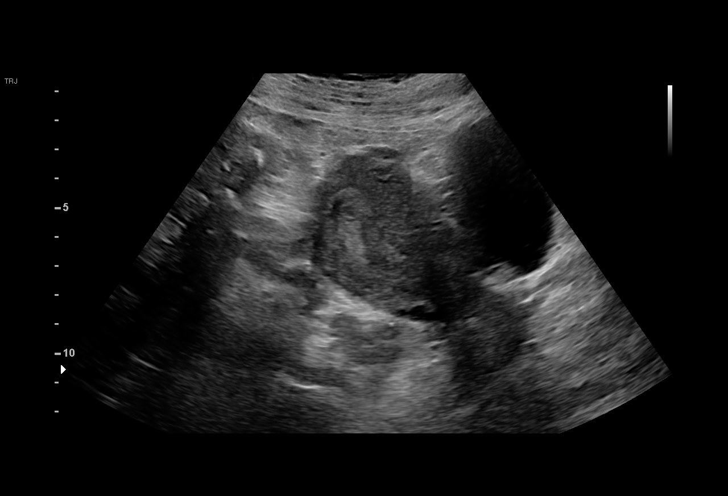
[im 13/83]
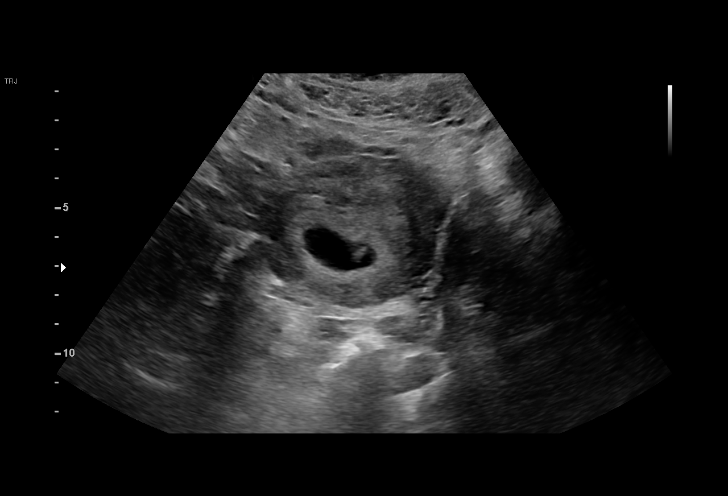
[im 19/83]
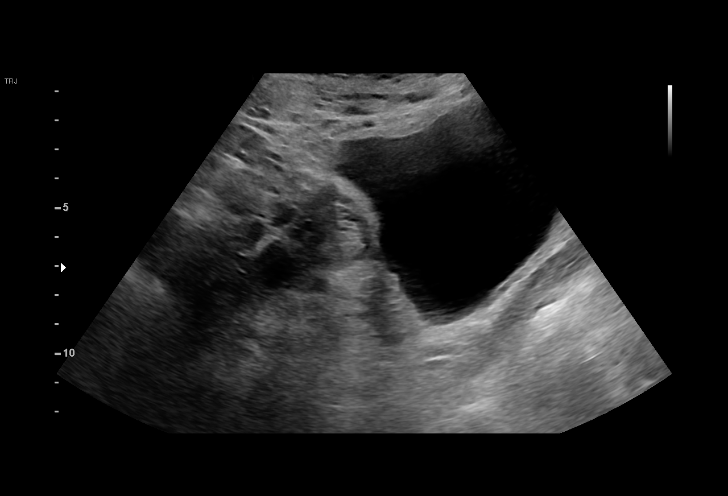
[im 25/83]
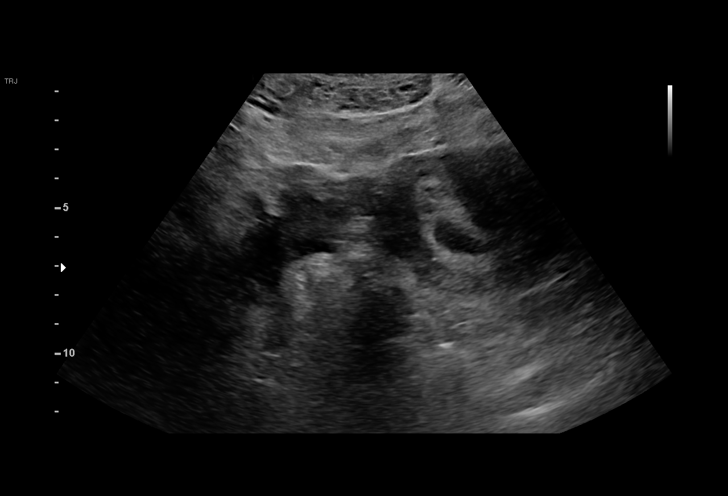
[im 31/83]
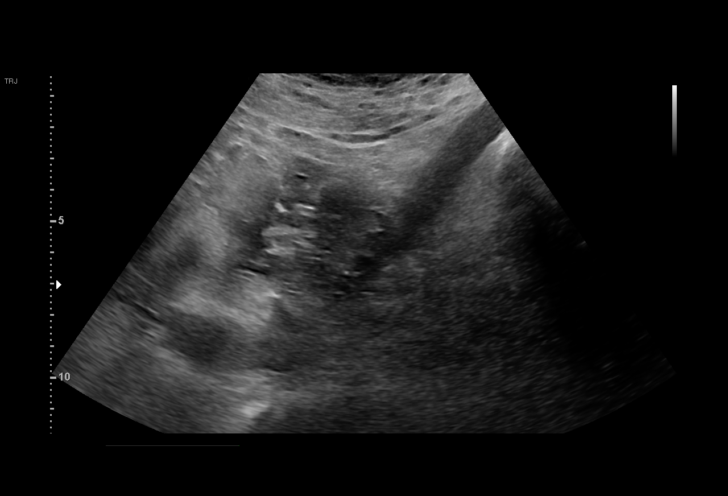
[im 37/83]
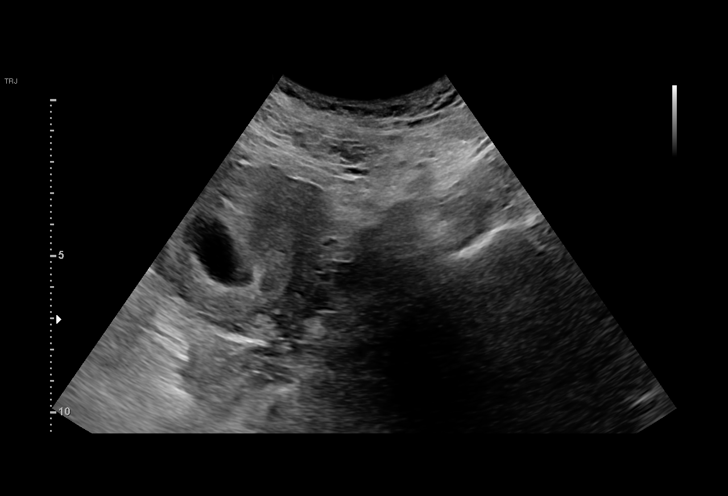
[im 43/83]
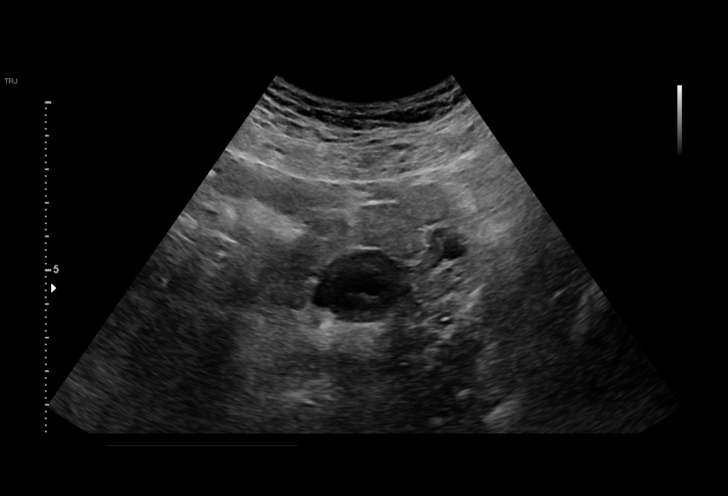
[im 46/83]
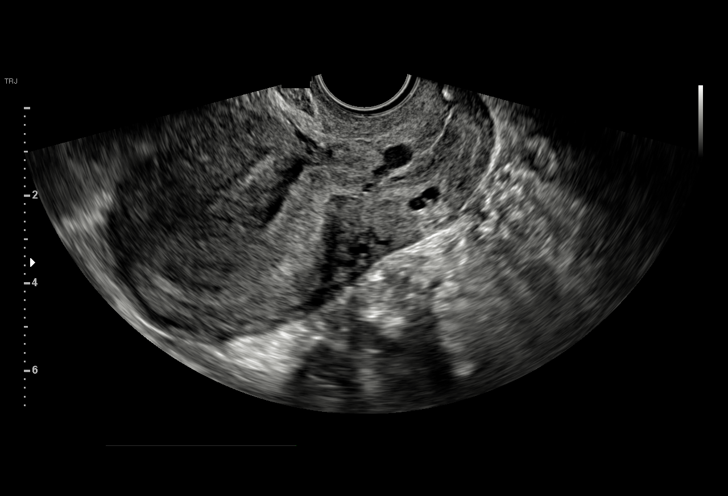
[im 52/83]
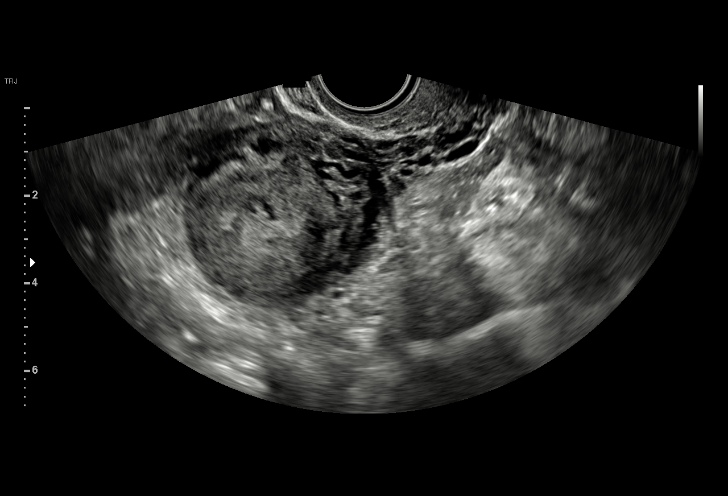
[im 58/83]
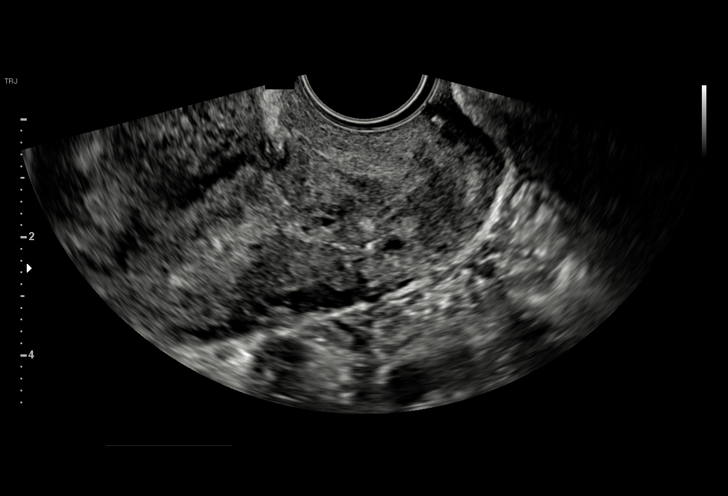
[im 64/83]
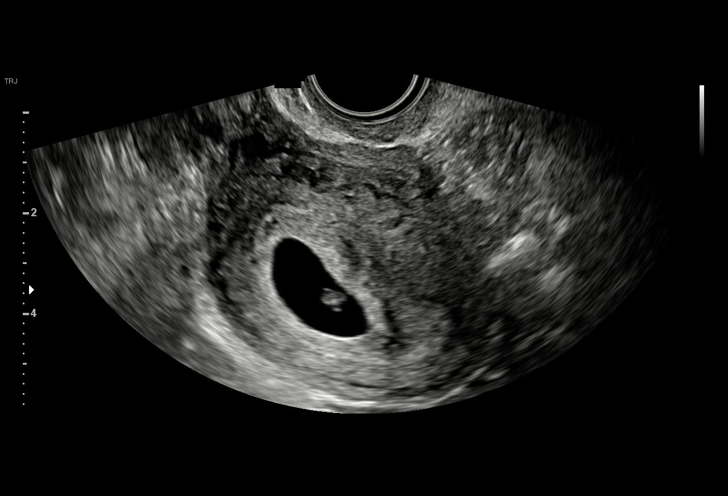
[im 70/83]
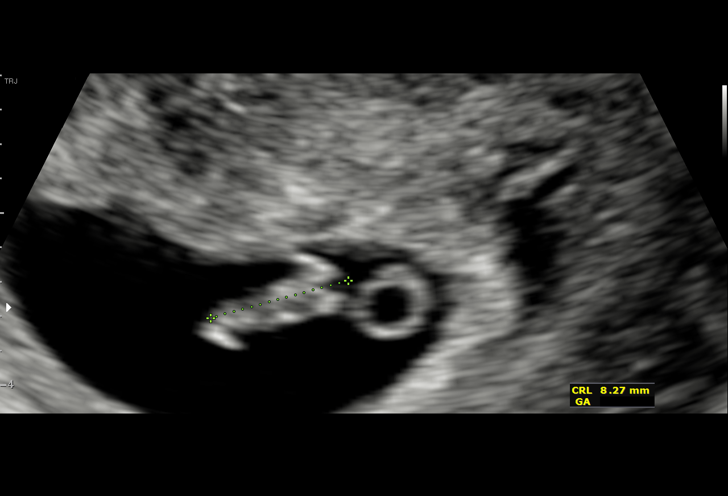
[im 76/83]
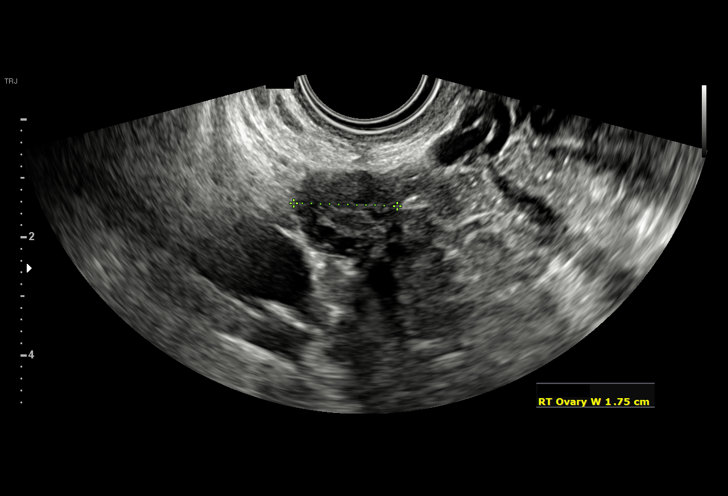
[im 83/83]
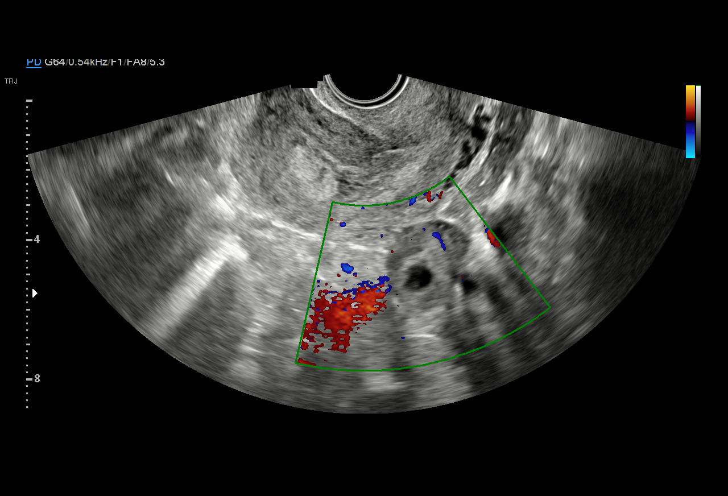

[15 of 28 positions shown; findings below may reference images not displayed]

FINDINGS: Intrauterine gestational sac: Single

Yolk sac:  Visualized.

Embryo:  Visualized.

Cardiac Activity: Visualized.

Heart Rate: 132 bpm

CRL:  8 mm   6 w   5 d                  US EDC: 04/06/2019

Subchorionic hemorrhage:  None visualized.

Maternal uterus/adnexae: Both ovaries are normal in appearance. No
mass or abnormal free fluid identified.
IMPRESSION: Single living IUP measuring 6 weeks 5 days, with US EDC of
04/06/2019.

No significant maternal uterine or adnexal abnormality identified.

## 2020-08-28 NOTE — Telephone Encounter (Signed)
Spoke with the patient.She had come in today inquiring about antibiotics for a foot surgery tomorrow. She stated that she had antibiotics previously for the same surgery on a different foot. She has been advised that there was not a surgical clearance noted for her. She was advised that we can call Dr. Kandice Hams office who is doing the surgery but she declined stating that she would take care it. She has been advised to call back if anything further is needed

## 2020-08-29 ENCOUNTER — Other Ambulatory Visit: Payer: Self-pay | Admitting: Podiatry

## 2020-08-29 DIAGNOSIS — M21621 Bunionette of right foot: Secondary | ICD-10-CM | POA: Diagnosis not present

## 2020-08-29 DIAGNOSIS — M25571 Pain in right ankle and joints of right foot: Secondary | ICD-10-CM | POA: Diagnosis not present

## 2020-08-29 DIAGNOSIS — M2041 Other hammer toe(s) (acquired), right foot: Secondary | ICD-10-CM | POA: Diagnosis not present

## 2020-08-29 DIAGNOSIS — M2011 Hallux valgus (acquired), right foot: Secondary | ICD-10-CM | POA: Diagnosis not present

## 2020-08-29 DIAGNOSIS — M21541 Acquired clubfoot, right foot: Secondary | ICD-10-CM | POA: Diagnosis not present

## 2020-08-29 MED ORDER — CEPHALEXIN 500 MG PO CAPS: 500.0000 mg | ORAL_CAPSULE | Freq: Two times a day (BID) | ORAL | 0 refills | Status: DC

## 2020-08-29 MED ORDER — OXYCODONE-ACETAMINOPHEN 5-325 MG PO TABS
1.0000 | ORAL_TABLET | ORAL | 0 refills | Status: DC | PRN
Start: 2020-08-29 — End: 2021-01-07

## 2020-08-29 MED ORDER — ONDANSETRON HCL 4 MG PO TABS: 4.0000 mg | ORAL_TABLET | Freq: Three times a day (TID) | ORAL | 0 refills | Status: DC | PRN

## 2020-08-29 NOTE — Progress Notes (Signed)
Rx sent to pharmacy for outpatient surgery. °

## 2020-09-04 ENCOUNTER — Other Ambulatory Visit: Payer: Self-pay

## 2020-09-04 ENCOUNTER — Ambulatory Visit (INDEPENDENT_AMBULATORY_CARE_PROVIDER_SITE_OTHER): Payer: Medicaid Other

## 2020-09-04 ENCOUNTER — Ambulatory Visit (INDEPENDENT_AMBULATORY_CARE_PROVIDER_SITE_OTHER): Payer: Medicaid Other | Admitting: Podiatry

## 2020-09-04 DIAGNOSIS — M2011 Hallux valgus (acquired), right foot: Secondary | ICD-10-CM

## 2020-09-04 DIAGNOSIS — M21619 Bunion of unspecified foot: Secondary | ICD-10-CM

## 2020-09-04 NOTE — Progress Notes (Signed)
  Subjective:  Patient ID: Tiffany Salazar, female    DOB: 19-Aug-1981,  MRN: [id number]  Chief Complaint  Patient presents with  . Routine Post Op    POV#1 DOS 1.5.2022 RT FOOT CORRECTION OF BUNION, CORRECTION OF TAILOR'S BUNION, CORRECTION OF 4TH/5TH HAMMERTOES, . 2ND METATARSAL SHORTENING IF NEEDED    DOS: 08/29/20 Procedure: Right foot Austin bunionectomy, tailor's bunionectomy, 4th/5th hammertoe corrections   40 y.o. female presents with the above complaint. History confirmed with patient. States she is not having much pain nor swelling, is trying to elevate the foot. Having a more straightforward recovery than her other foot.  Objective:  Physical Exam: tenderness at the surgical site, local edema noted and calf supple, nontender. Incision: healing well, no significant drainage, no dehiscence, no significant erythema  No images are attached to the encounter.  Radiographs: X-ray of the right foot: consistent with post-op state good alignment noted decrased 1st IMA   Assessment:   1. Bunion   2. Hallux valgus, right     Plan:  Patient was evaluated and treated and all questions answered.  Post-operative State -XR reviewed with patient -Dressing applied consisting of sterile gauze, kerlix and ACE bandage -WBAT in CAM boot  -Plan for suture removal in 2 weeks.  Return in about 2 weeks (around 09/18/2020) for Post-Op (No XRs).

## 2020-09-11 ENCOUNTER — Encounter: Payer: Medicaid Other | Admitting: Podiatry

## 2020-09-13 DIAGNOSIS — Z03818 Encounter for observation for suspected exposure to other biological agents ruled out: Secondary | ICD-10-CM | POA: Diagnosis not present

## 2020-09-21 ENCOUNTER — Ambulatory Visit (INDEPENDENT_AMBULATORY_CARE_PROVIDER_SITE_OTHER): Payer: Medicaid Other | Admitting: Podiatry

## 2020-09-21 ENCOUNTER — Other Ambulatory Visit: Payer: Self-pay

## 2020-09-21 DIAGNOSIS — M2011 Hallux valgus (acquired), right foot: Secondary | ICD-10-CM

## 2020-09-21 DIAGNOSIS — M21619 Bunion of unspecified foot: Secondary | ICD-10-CM

## 2020-09-21 DIAGNOSIS — Z9889 Other specified postprocedural states: Secondary | ICD-10-CM

## 2020-09-21 NOTE — Progress Notes (Signed)
  Subjective:  Patient ID: Tiffany Salazar, female    DOB: 09-14-1980,  MRN: [id number]  No chief complaint on file.  DOS: 08/29/20 Procedure: Right foot Austin bunionectomy, tailor's bunionectomy, 4th/5th hammertoe corrections   40 y.o. female presents with the above complaint. History confirmed with patient. Denies pain today. Ambulating in her boot, requests new boot as the one she has got wet and has an odor.  Objective:  Physical Exam: tenderness at the surgical site, local edema noted and calf supple, nontender. Incision: healing well, no significant drainage, no dehiscence, no significant erythema  No images are attached to the encounter.  Radiographs: X-ray of the right foot: consistent with post-op state good alignment noted decrased 1st IMA   Assessment:   1. Bunion   2. Hallux valgus, right   3. Post-operative state    Plan:  Patient was evaluated and treated and all questions answered.  Post-operative State -Sutures removed -Steri-strips applied to the incision -Ok to start showering at this time. Advised they cannot soak. -WBAT in CAM boot - new RX written. -New XR in 2 weeks, plan to progress WB.  No follow-ups on file.

## 2020-09-25 ENCOUNTER — Encounter: Payer: Medicaid Other | Admitting: Podiatry

## 2020-09-26 DIAGNOSIS — M79674 Pain in right toe(s): Secondary | ICD-10-CM | POA: Diagnosis not present

## 2020-10-05 ENCOUNTER — Other Ambulatory Visit: Payer: Self-pay

## 2020-10-05 ENCOUNTER — Ambulatory Visit (INDEPENDENT_AMBULATORY_CARE_PROVIDER_SITE_OTHER): Payer: Medicaid Other | Admitting: Podiatry

## 2020-10-05 ENCOUNTER — Ambulatory Visit (INDEPENDENT_AMBULATORY_CARE_PROVIDER_SITE_OTHER): Payer: Medicaid Other

## 2020-10-05 DIAGNOSIS — M2011 Hallux valgus (acquired), right foot: Secondary | ICD-10-CM

## 2020-10-05 NOTE — Progress Notes (Signed)
  Subjective:  Patient ID: Tiffany Salazar, female    DOB: [birth year],  MRN: [id number]  Chief Complaint  Patient presents with  . Routine Post Op    Pov#3 Denies fever/chills/nausea/vomiting. Pt states no concerns.   DOS: 08/29/20 Procedure: Right foot Austin bunionectomy, tailor's bunionectomy, 4th/5th hammertoe corrections   40 y.o. female presents with the above complaint. History confirmed with patient. Denies pain today. Doing very well and not having any issues at present.  Objective:  Physical Exam: tenderness at the surgical site, local edema noted and calf supple, nontender. Incision: healing well, no significant drainage, no dehiscence, no significant erythema  No images are attached to the encounter.  Radiographs: X-ray of the right foot: consistent with post-op state good alignment noted decrased 1st IMA, osteotom appears bridged.  Assessment:   1. Hallux valgus, right    Plan:  Patient was evaluated and treated and all questions answered.  Post-operative State -XR reviewed with patient -WBAT in Surgical shoe  -Transition to normal shoegear in 2 weeks. -F/u in 1 month for final recheck.  No follow-ups on file.

## 2020-11-02 ENCOUNTER — Other Ambulatory Visit: Payer: Self-pay

## 2020-11-02 ENCOUNTER — Ambulatory Visit (INDEPENDENT_AMBULATORY_CARE_PROVIDER_SITE_OTHER): Payer: Medicaid Other | Admitting: Podiatry

## 2020-11-02 DIAGNOSIS — M2011 Hallux valgus (acquired), right foot: Secondary | ICD-10-CM

## 2020-11-02 DIAGNOSIS — M21619 Bunion of unspecified foot: Secondary | ICD-10-CM

## 2020-11-02 DIAGNOSIS — Z9889 Other specified postprocedural states: Secondary | ICD-10-CM

## 2020-11-22 NOTE — Progress Notes (Signed)
  Subjective:  Patient ID: Tiffany Salazar, female    DOB: 08-Jul-1981,  MRN: [id number]  No chief complaint on file.  DOS: 08/29/20 Procedure: Right foot Austin bunionectomy, tailor's bunionectomy, 4th/5th hammertoe corrections   40 y.o. female presents with the above complaint. History confirmed with patient. Denies pain today. Doing very well and not having any issues at present.  Objective:  Physical Exam: No tenderness noted about the surgical sites.  Good range of motion of the first and fifth metatarsophalangeal joints.  No clinical bunion or tailor's bunion deformity.  Mild edema noted. Incision: Incisions well-healed  Assessment:   1. Hallux valgus, right   2. Bunion   3. Post-operative state    Plan:  Patient was evaluated and treated and all questions answered.  Post-operative State -Appears fully healed.  Still with occasional swelling.  I think that the swelling will subside and allow her to wear shoes more normally.  Follow-up in 6 weeks for recheck  No follow-ups on file.

## 2020-12-14 ENCOUNTER — Other Ambulatory Visit: Payer: Self-pay

## 2020-12-14 ENCOUNTER — Ambulatory Visit (INDEPENDENT_AMBULATORY_CARE_PROVIDER_SITE_OTHER): Payer: Medicaid Other | Admitting: Podiatry

## 2020-12-14 DIAGNOSIS — M21621 Bunionette of right foot: Secondary | ICD-10-CM

## 2020-12-14 DIAGNOSIS — M2011 Hallux valgus (acquired), right foot: Secondary | ICD-10-CM

## 2020-12-14 DIAGNOSIS — M21619 Bunion of unspecified foot: Secondary | ICD-10-CM

## 2020-12-27 NOTE — Progress Notes (Signed)
  Subjective:  Patient ID: Tiffany Salazar, female    DOB: 1980/11/01,  MRN: [id number]  Chief Complaint  Patient presents with  . Hammer Toe    Pt states still experiencing swelling in foot "My foot still feels too wide for my shoes". Pt states gradual but minimal improvement with this.   DOS: 08/29/20 Procedure: Right foot Austin bunionectomy, tailor's bunionectomy, 4th/5th hammertoe corrections   40 y.o. female presents with the above complaint. History confirmed with patient. Denies pain today. Doing very well and not having any issues at present.  Objective:  Physical Exam: No tenderness noted about the surgical sites.  Good range of motion of the first and fifth metatarsophalangeal joints.  No clinical bunion or tailor's bunion deformity.  Mild edema noted. Incision: Incisions well-healed  Assessment:   1. Hallux valgus, right   2. Bunion   3. Tailor's bunionette, right    Plan:  Patient was evaluated and treated and all questions answered.  Post-operative State -She is continue to do quite well she is only having some pain at the tailor's bunion on the right.  We did discuss that should this continue not to resolve we could consider surgical correction.  I think a good portion of this is edema and should resolve with time but will follow-up in several weeks to discuss  No follow-ups on file.

## 2020-12-28 ENCOUNTER — Ambulatory Visit (INDEPENDENT_AMBULATORY_CARE_PROVIDER_SITE_OTHER): Payer: Medicaid Other | Admitting: Podiatry

## 2020-12-28 ENCOUNTER — Other Ambulatory Visit: Payer: Self-pay

## 2020-12-28 DIAGNOSIS — M21621 Bunionette of right foot: Secondary | ICD-10-CM | POA: Diagnosis not present

## 2020-12-28 DIAGNOSIS — M21619 Bunion of unspecified foot: Secondary | ICD-10-CM | POA: Diagnosis not present

## 2020-12-28 DIAGNOSIS — M2011 Hallux valgus (acquired), right foot: Secondary | ICD-10-CM | POA: Diagnosis not present

## 2020-12-28 NOTE — H&P (View-Only) (Signed)
  Subjective:  Patient ID: Tiffany Salazar, female    DOB: 03/26/1981,  MRN: [id number]  Chief Complaint  Patient presents with  . Surgery Consultation    Right foot surgery consult. Pt states she is unable to wear her shoes on the right foot.    DOS: 08/29/20 Procedure: Right foot Austin bunionectomy, tailor's bunionectomy, 4th/5th hammertoe corrections   40 y.o. female presents with the above complaint. History confirmed with patient. Denies pain today. Doing very well and not having any issues at present.  Objective:  Physical Exam: No tenderness noted about the surgical sites.  Good range of motion of the first and fifth metatarsophalangeal joints.  Mild clinical tailor's bunion deformity.  Mild edema noted. Incision: Incisions well-healed  Assessment:   1. Hallux valgus, right   2. Bunion   3. Tailor's bunionette, right    Plan:  Patient was evaluated and treated and all questions answered.  Post-operative State -Patient has failed all conservative therapy and wishes to proceed with surgical intervention. All risks, benefits, and alternatives discussed with patient. No guarantees given. Consent reviewed and signed by patient. -Planned procedures: Right tailor's bunion correction with osteotomy - MIS vs open  No follow-ups on file.  

## 2020-12-28 NOTE — Progress Notes (Signed)
  Subjective:  Patient ID: Tiffany Salazar, female    DOB: 11/20/1980,  MRN: [id number]  Chief Complaint  Patient presents with  . Surgery Consultation    Right foot surgery consult. Pt states she is unable to wear her shoes on the right foot.    DOS: 08/29/20 Procedure: Right foot Austin bunionectomy, tailor's bunionectomy, 4th/5th hammertoe corrections   40 y.o. female presents with the above complaint. History confirmed with patient. Denies pain today. Doing very well and not having any issues at present.  Objective:  Physical Exam: No tenderness noted about the surgical sites.  Good range of motion of the first and fifth metatarsophalangeal joints.  Mild clinical tailor's bunion deformity.  Mild edema noted. Incision: Incisions well-healed  Assessment:   1. Hallux valgus, right   2. Bunion   3. Tailor's bunionette, right    Plan:  Patient was evaluated and treated and all questions answered.  Post-operative State -Patient has failed all conservative therapy and wishes to proceed with surgical intervention. All risks, benefits, and alternatives discussed with patient. No guarantees given. Consent reviewed and signed by patient. -Planned procedures: Right tailor's bunion correction with osteotomy - MIS vs open  No follow-ups on file.

## 2021-01-02 ENCOUNTER — Ambulatory Visit: Payer: Self-pay | Admitting: Podiatry

## 2021-01-02 DIAGNOSIS — M21621 Bunionette of right foot: Secondary | ICD-10-CM

## 2021-01-04 NOTE — Patient Instructions (Addendum)
DUE TO COVID-19 ONLY ONE VISITOR IS ALLOWED TO COME WITH YOU AND STAY IN THE WAITING ROOM ONLY DURING PRE OP AND PROCEDURE DAY OF SURGERY. THE 1 VISITOR  MAY VISIT WITH YOU AFTER SURGERY IN YOUR PRIVATE ROOM DURING VISITING HOURS ONLY!                Tiffany Salazar   Your procedure is scheduled on: 01/09/21   Report to [facility] Main  Entrance   Report to admitting at: 1:20 AM     Call this number if you have problems the morning of surgery [phone]    Remember: NO SOLID FOOD AFTER MIDNIGHT THE NIGHT PRIOR TO SURGERY. NOTHING BY MOUTH EXCEPT CLEAR LIQUIDS UNTIL: 12:20 PM . PLEASE FINISH ENSURE DRINK PER SURGEON ORDER  WHICH NEEDS TO BE COMPLETED AT: 12:20 PM .  CLEAR LIQUID DIET  Foods Allowed                                                                     Foods Excluded  Coffee and tea, regular and decaf                             liquids that you cannot  Plain Jell-O any favor except red or purple                                           see through such as: Fruit ices (not with fruit pulp)                                     milk, soups, orange juice  Iced Popsicles                                    All solid food Carbonated beverages, regular and diet                                    Cranberry, grape and apple juices Sports drinks like Gatorade Lightly seasoned clear broth or consume(fat free) Sugar, honey syrup  Sample Menu Breakfast                                Lunch                                     Supper Cranberry juice                    Beef broth                            Chicken broth Jell-O  Grape juice                           Apple juice Coffee or tea                        Jell-O                                      Popsicle                                                Coffee or tea                        Coffee or tea  _____________________________________________________________________  BRUSH  YOUR TEETH MORNING OF SURGERY AND RINSE YOUR MOUTH OUT, NO CHEWING GUM CANDY OR MINTS.                               You may not have any metal on your body including hair pins and              piercings  Do not wear jewelry, make-up, lotions, powders or perfumes, deodorant             Do not wear nail polish on your fingernails.  Do not shave  48 hours prior to surgery.    Do not bring valuables to the hospital. [facility] IS NOT             RESPONSIBLE   FOR VALUABLES.  Contacts, dentures or bridgework may not be worn into surgery.  Leave suitcase in the car. After surgery it may be brought to your room.     Patients discharged the day of surgery will not be allowed to drive home. IF YOU ARE HAVING SURGERY AND GOING HOME THE SAME DAY, YOU MUST HAVE AN ADULT TO DRIVE YOU HOME AND BE WITH YOU FOR 24 HOURS. YOU MAY GO HOME BY TAXI OR UBER OR ORTHERWISE, BUT AN ADULT MUST ACCOMPANY YOU HOME AND STAY WITH YOU FOR 24 HOURS.  Name and phone number of your driver:  Special Instructions: N/A              Please read over the following fact sheets you were given: _____________________________________________________________________  [facility] - Preparing for Surgery Before surgery, you can play an important role.  Because skin is not sterile, your skin needs to be as free of germs as possible.  You can reduce the number of germs on your skin by washing with CHG (chlorahexidine gluconate) soap before surgery.  CHG is an antiseptic cleaner which kills germs and bonds with the skin to continue killing germs even after washing. Please DO NOT use if you have an allergy to CHG or antibacterial soaps.  If your skin becomes reddened/irritated stop using the CHG and inform your nurse when you arrive at Short Stay. Do not shave (including legs and underarms) for at least 48 hours prior to the first CHG shower.  You may shave your face/neck. Please follow these instructions carefully:  1.  Shower with CHG  Soap the night before surgery and  the  morning of Surgery.  2.  If you choose to wash your hair, wash your hair first as usual with your  normal  shampoo.  3.  After you shampoo, rinse your hair and body thoroughly to remove the  shampoo.                           4.  Use CHG as you would any other liquid soap.  You can apply chg directly  to the skin and wash                       Gently with a scrungie or clean washcloth.  5.  Apply the CHG Soap to your body ONLY FROM THE NECK DOWN.   Do not use on face/ open                           Wound or open sores. Avoid contact with eyes, ears mouth and genitals (private parts).                       Wash face,  Genitals (private parts) with your normal soap.             6.  Wash thoroughly, paying special attention to the area where your surgery  will be performed.  7.  Thoroughly rinse your body with warm water from the neck down.  8.  DO NOT shower/wash with your normal soap after using and rinsing off  the CHG Soap.                9.  Pat yourself dry with a clean towel.            10.  Wear clean pajamas.            11.  Place clean sheets on your bed the night of your first shower and do not  sleep with pets. Day of Surgery : Do not apply any lotions/deodorants the morning of surgery.  Please wear clean clothes to the hospital/surgery center.  FAILURE TO FOLLOW THESE INSTRUCTIONS MAY RESULT IN THE CANCELLATION OF YOUR SURGERY PATIENT SIGNATURE_________________________________  NURSE SIGNATURE__________________________________  ________________________________________________________________________   Tiffany Salazar  An incentive spirometer is a tool that can help keep your lungs clear and active. This tool measures how well you are filling your lungs with each breath. Taking long deep breaths may help reverse or decrease the chance of developing breathing (pulmonary) problems (especially infection) following:  A long period of time  when you are unable to move or be active. BEFORE THE PROCEDURE   If the spirometer includes an indicator to show your best effort, your nurse or respiratory therapist will set it to a desired goal.  If possible, sit up straight or lean slightly forward. Try not to slouch.  Hold the incentive spirometer in an upright position. INSTRUCTIONS FOR USE  1. Sit on the edge of your bed if possible, or sit up as far as you can in bed or on a chair. 2. Hold the incentive spirometer in an upright position. 3. Breathe out normally. 4. Place the mouthpiece in your mouth and seal your lips tightly around it. 5. Breathe in slowly and as deeply as possible, raising the piston or the ball toward the top of the column. 6. Hold your breath for 3-5 seconds or for as long  as possible. Allow the piston or ball to fall to the bottom of the column. 7. Remove the mouthpiece from your mouth and breathe out normally. 8. Rest for a few seconds and repeat Steps 1 through 7 at least 10 times every 1-2 hours when you are awake. Take your time and take a few normal breaths between deep breaths. 9. The spirometer may include an indicator to show your best effort. Use the indicator as a goal to work toward during each repetition. 10. After each set of 10 deep breaths, practice coughing to be sure your lungs are clear. If you have an incision (the cut made at the time of surgery), support your incision when coughing by placing a pillow or rolled up towels firmly against it. Once you are able to get out of bed, walk around indoors and cough well. You may stop using the incentive spirometer when instructed by your caregiver.  RISKS AND COMPLICATIONS  Take your time so you do not get dizzy or light-headed.  If you are in pain, you may need to take or ask for pain medication before doing incentive spirometry. It is harder to take a deep breath if you are having pain. AFTER USE  Rest and breathe slowly and easily.  It can be  helpful to keep track of a log of your progress. Your caregiver can provide you with a simple table to help with this. If you are using the spirometer at home, follow these instructions: SEEK MEDICAL CARE IF:   You are having difficultly using the spirometer.  You have trouble using the spirometer as often as instructed.  Your pain medication is not giving enough relief while using the spirometer.  You develop fever of 100.5 F (38.1 C) or higher. SEEK IMMEDIATE MEDICAL CARE IF:   You cough up bloody sputum that had not been present before.  You develop fever of 102 F (38.9 C) or greater.  You develop worsening pain at or near the incision site. MAKE SURE YOU:   Understand these instructions.  Will watch your condition.  Will get help right away if you are not doing well or get worse. Document Released: 12/22/2006 Document Revised: 11/03/2011 Document Reviewed: 02/22/2007 [facility] Patient Information 2014 [employer], [place].   ________________________________________________________________________

## 2021-01-04 NOTE — Progress Notes (Signed)
Pt. Needs H & P for upcomming surgery.

## 2021-01-07 ENCOUNTER — Other Ambulatory Visit: Payer: Self-pay

## 2021-01-07 ENCOUNTER — Ambulatory Visit (INDEPENDENT_AMBULATORY_CARE_PROVIDER_SITE_OTHER): Payer: Medicaid Other | Admitting: Family Medicine

## 2021-01-07 ENCOUNTER — Encounter: Payer: Self-pay | Admitting: Family Medicine

## 2021-01-07 DIAGNOSIS — Z01818 Encounter for other preprocedural examination: Secondary | ICD-10-CM

## 2021-01-07 NOTE — Progress Notes (Signed)
    SUBJECTIVE:   CHIEF COMPLAINT / HPI: Preoperative assessment  Patient reports that on 5/18, she is scheduled for surgery involving a deformity on her foot.  She reports that she has had the surgery on the contralateral foot prior and had no issues.  Patient has history of tricuspid valve replacement as well as history of IV drug use.  Patient continues to report abstinence from recreational drug use.  She does report that she continues to vape.  Patient states that she has not followed up with cardiology recently but is due for an appointment.  She states that she sees them annually for follow-up.  PERTINENT  PMH / PSH: History of IV drug use Tricuspid valve replacement   OBJECTIVE:   BP 104/68   Pulse 64   Ht 5\' 8"  (1.727 m)   Wt 203 lb 6.4 oz (92.3 kg)   LMP 12/27/2020 (Approximate)   SpO2 98%   BMI 30.93 kg/m   General: Female appearing stated age in no acute distress HEENT: MMM, no oral lesions noted,Neck non-tender without lymphadenopathy Cardio: Normal S1 and S2, no S3 or S4. Rhythm is regular.  Tricuspid valve murmur appreciated bilateral radial pulses palpable Pulm: Clear to auscultation bilaterally, no crackles, wheezing, or diminished breath sounds. Normal respiratory effort, on room air Abdomen: Bowel sounds normal. Abdomen soft and non-tender.  Extremities: No peripheral edema. Warm & well perfused.  Neuro: pt alert and oriented x4, follows commands, PERRLA, EOMI bilaterally   ASSESSMENT/PLAN:   Encounter for preoperative assessment Given the low risk nature of scheduled operation, patient determined to be appropriate to proceed with scheduled operation. Believe this is low risk for cardiac event during operation.  Advised patient to follow-up with cardiology for annual evaluation and follow-up of tricuspid valve replacement.  Patient denies any difficulty breathing as well as any chest pain today. Patient has physical and exam form from surgeon.  This form was  completed today.  Given history of endocarditis of tricuspid valve, reviewed recommendations for prophylactic antibiotics and given the nature of the surgery, patient does not need prophylactic antibiotic treatment.     02/26/2021, MD [facility]

## 2021-01-07 NOTE — Patient Instructions (Signed)
It was a pleasure to see you today!  Thank you for choosing [facility] for your primary care.   Tiffany Salazar was seen for preoperative assessment.   Our plans for today were:  I have completed the form for your foot surgery.  You are okay to proceed with your procedure on 01/10/2019  You are not due for Pap smear for another 2 years and 2024   Best Wishes,   Dr. Neita Garnet

## 2021-01-08 ENCOUNTER — Encounter (HOSPITAL_COMMUNITY)
Admission: RE | Admit: 2021-01-08 | Discharge: 2021-01-08 | Disposition: A | Discharge status: Home / Self Care | Payer: Medicaid Other | Source: Ambulatory Visit | Attending: Podiatry | Admitting: Podiatry

## 2021-01-08 ENCOUNTER — Encounter (HOSPITAL_COMMUNITY): Payer: Self-pay

## 2021-01-08 ENCOUNTER — Other Ambulatory Visit: Payer: Self-pay

## 2021-01-08 DIAGNOSIS — Z01818 Encounter for other preprocedural examination: Secondary | ICD-10-CM | POA: Insufficient documentation

## 2021-01-08 HISTORY — DX: Anemia, unspecified: D64.9

## 2021-01-08 HISTORY — DX: Unspecified asthma, uncomplicated: J45.909

## 2021-01-08 LAB — CBC
HCT: 41.9 % (ref 36.0–46.0)
Hemoglobin: 13.3 g/dL (ref 12.0–15.0)
MCH: 30.9 pg (ref 26.0–34.0)
MCHC: 31.7 g/dL (ref 30.0–36.0)
MCV: 97.4 fL (ref 80.0–100.0)
Platelets: 233 10*3/uL (ref 150–400)
RBC: 4.3 MIL/uL (ref 3.87–5.11)
RDW: 13.2 % (ref 11.5–15.5)
WBC: 6.2 10*3/uL (ref 4.0–10.5)
nRBC: 0 % (ref 0.0–0.2)

## 2021-01-08 LAB — SURGICAL PCR SCREEN
MRSA, PCR: NEGATIVE
Staphylococcus aureus: NEGATIVE

## 2021-01-08 LAB — BASIC METABOLIC PANEL
Anion gap: 4 — ABNORMAL LOW (ref 5–15)
BUN: 14 mg/dL (ref 6–20)
CO2: 28 mmol/L (ref 22–32)
Calcium: 9 mg/dL (ref 8.9–10.3)
Chloride: 106 mmol/L (ref 98–111)
Creatinine, Ser: 0.87 mg/dL (ref 0.44–1.00)
GFR, Estimated: >60 mL/min (ref >60)
Glucose, Bld: 100 mg/dL — ABNORMAL HIGH (ref 70–99)
Potassium: 4.8 mmol/L (ref 3.5–5.1)
Sodium: 138 mmol/L (ref 135–145)

## 2021-01-08 NOTE — Progress Notes (Signed)
COVID Vaccine Completed: Yes Date COVID Vaccine completed: 12/20/19 COVID vaccine manufacturer: Pfizer    Moderna   [employer]   PCP - Dr. Terisa Starr Cardiologist - Dr. Chilton Si. LOV: 07/03/20  Chest x-ray -  EKG -  Stress Test -  ECHO - 05/24/18 Cardiac Cath -  Pacemaker/ICD device last checked:  Sleep Study -  CPAP -   Fasting Blood Sugar -  Checks Blood Sugar _____ times a day  Blood Thinner Instructions: Aspirin Instructions: Last Dose:  Anesthesia review: Hx: tricuspid valve replacement (2019)  Patient denies shortness of breath, fever, cough and chest pain at PAT appointment   Patient verbalized understanding of instructions that were given to them at the PAT appointment. Patient was also instructed that they will need to review over the PAT instructions again at home before surgery.

## 2021-01-09 ENCOUNTER — Encounter (HOSPITAL_COMMUNITY): Payer: Self-pay | Admitting: Podiatry

## 2021-01-09 ENCOUNTER — Ambulatory Visit (HOSPITAL_COMMUNITY): Payer: Medicaid Other | Admitting: Physician Assistant

## 2021-01-09 ENCOUNTER — Ambulatory Visit (HOSPITAL_COMMUNITY): Payer: Medicaid Other | Admitting: Anesthesiology

## 2021-01-09 ENCOUNTER — Encounter (HOSPITAL_COMMUNITY)
Admission: RE | Disposition: A | Discharge status: Home / Self Care | Payer: Self-pay | Source: Home / Self Care | Attending: Podiatry

## 2021-01-09 ENCOUNTER — Ambulatory Visit (HOSPITAL_COMMUNITY)
Admission: RE | Admit: 2021-01-09 | Discharge: 2021-01-09 | Disposition: A | Discharge status: Home / Self Care | Payer: Medicaid Other | Attending: Podiatry | Admitting: Podiatry

## 2021-01-09 DIAGNOSIS — Z79899 Other long term (current) drug therapy: Secondary | ICD-10-CM | POA: Insufficient documentation

## 2021-01-09 DIAGNOSIS — K7460 Unspecified cirrhosis of liver: Secondary | ICD-10-CM | POA: Diagnosis not present

## 2021-01-09 DIAGNOSIS — F17200 Nicotine dependence, unspecified, uncomplicated: Secondary | ICD-10-CM | POA: Diagnosis not present

## 2021-01-09 DIAGNOSIS — M21621 Bunionette of right foot: Secondary | ICD-10-CM

## 2021-01-09 DIAGNOSIS — Z881 Allergy status to other antibiotic agents status: Secondary | ICD-10-CM | POA: Insufficient documentation

## 2021-01-09 DIAGNOSIS — F32A Depression, unspecified: Secondary | ICD-10-CM | POA: Diagnosis not present

## 2021-01-09 DIAGNOSIS — Z79891 Long term (current) use of opiate analgesic: Secondary | ICD-10-CM | POA: Diagnosis not present

## 2021-01-09 DIAGNOSIS — B1920 Unspecified viral hepatitis C without hepatic coma: Secondary | ICD-10-CM | POA: Diagnosis not present

## 2021-01-09 DIAGNOSIS — Z952 Presence of prosthetic heart valve: Secondary | ICD-10-CM | POA: Diagnosis not present

## 2021-01-09 HISTORY — PX: BUNIONECTOMY: SHX129

## 2021-01-09 LAB — PREGNANCY, URINE: Preg Test, Ur: NEGATIVE

## 2021-01-09 SURGERY — BUNIONECTOMY
Anesthesia: Monitor Anesthesia Care | Site: Toe | Laterality: Right

## 2021-01-09 MED ORDER — ONDANSETRON HCL 4 MG PO TABS: 4.0000 mg | ORAL_TABLET | Freq: Three times a day (TID) | ORAL | 0 refills | Status: DC | PRN

## 2021-01-09 MED ORDER — FENTANYL CITRATE (PF) 100 MCG/2ML IJ SOLN
INTRAMUSCULAR | Status: AC
  Filled 2021-01-09: qty 2

## 2021-01-09 MED ORDER — OXYCODONE HCL 5 MG PO TABS: 5.0000 mg | ORAL_TABLET | Freq: Three times a day (TID) | ORAL | 0 refills | Status: DC | PRN

## 2021-01-09 MED ORDER — PROPOFOL 500 MG/50ML IV EMUL
INTRAVENOUS | Status: DC | PRN
  Administered 2021-01-09: 150 ug/kg/min via INTRAVENOUS

## 2021-01-09 MED ORDER — ONDANSETRON HCL 4 MG/2ML IJ SOLN
INTRAMUSCULAR | Status: DC | PRN
  Administered 2021-01-09: 4 mg via INTRAVENOUS

## 2021-01-09 MED ORDER — MIDAZOLAM HCL 2 MG/2ML IJ SOLN
INTRAMUSCULAR | Status: AC
  Filled 2021-01-09: qty 2

## 2021-01-09 MED ORDER — PROPOFOL 10 MG/ML IV BOLUS
INTRAVENOUS | Status: DC | PRN
  Administered 2021-01-09: 20 mg via INTRAVENOUS

## 2021-01-09 MED ORDER — CEFAZOLIN SODIUM-DEXTROSE 2-4 GM/100ML-% IV SOLN
2.0000 g | INTRAVENOUS | Status: AC
  Administered 2021-01-09: 2 g via INTRAVENOUS
  Filled 2021-01-09: qty 100

## 2021-01-09 MED ORDER — FENTANYL CITRATE (PF) 100 MCG/2ML IJ SOLN
INTRAMUSCULAR | Status: DC | PRN
  Administered 2021-01-09: 50 ug via INTRAVENOUS

## 2021-01-09 MED ORDER — BUPIVACAINE HCL (PF) 0.5 % IJ SOLN
INTRAMUSCULAR | Status: AC
  Filled 2021-01-09: qty 30

## 2021-01-09 MED ORDER — CHLORHEXIDINE GLUCONATE 0.12 % MT SOLN
15.0000 mL | Freq: Once | OROMUCOSAL | Status: AC
  Administered 2021-01-09: 15 mL via OROMUCOSAL

## 2021-01-09 MED ORDER — LACTATED RINGERS IV SOLN: INTRAVENOUS | Status: DC

## 2021-01-09 MED ORDER — MIDAZOLAM HCL 2 MG/2ML IJ SOLN
INTRAMUSCULAR | Status: DC | PRN
  Administered 2021-01-09: 2 mg via INTRAVENOUS

## 2021-01-09 MED ORDER — 0.9 % SODIUM CHLORIDE (POUR BTL) OPTIME
TOPICAL | Status: DC | PRN
  Administered 2021-01-09: 1000 mL

## 2021-01-09 MED ORDER — BUPIVACAINE HCL (PF) 0.5 % IJ SOLN
INTRAMUSCULAR | Status: DC | PRN
  Administered 2021-01-09: 10 mL

## 2021-01-09 MED ORDER — PROPOFOL 1000 MG/100ML IV EMUL
INTRAVENOUS | Status: AC
  Filled 2021-01-09: qty 100

## 2021-01-09 MED ORDER — OXYCODONE HCL 5 MG PO TABS: 5.0000 mg | ORAL_TABLET | Freq: Once | ORAL | Status: DC | PRN

## 2021-01-09 MED ORDER — ORAL CARE MOUTH RINSE: 15.0000 mL | Freq: Once | OROMUCOSAL | Status: AC

## 2021-01-09 MED ORDER — CEPHALEXIN 500 MG PO CAPS: ORAL_CAPSULE | ORAL | 0 refills | Status: DC

## 2021-01-09 MED ORDER — OXYCODONE HCL 5 MG/5ML PO SOLN: 5.0000 mg | Freq: Once | ORAL | Status: DC | PRN

## 2021-01-09 MED ORDER — FENTANYL CITRATE (PF) 100 MCG/2ML IJ SOLN: 25.0000 ug | INTRAMUSCULAR | Status: DC | PRN

## 2021-01-09 MED ORDER — ONDANSETRON HCL 4 MG/2ML IJ SOLN: 4.0000 mg | Freq: Four times a day (QID) | INTRAMUSCULAR | Status: DC | PRN

## 2021-01-09 MED ORDER — ONDANSETRON HCL 4 MG/2ML IJ SOLN
INTRAMUSCULAR | Status: AC
  Filled 2021-01-09: qty 2

## 2021-01-09 MED ORDER — PROPOFOL 10 MG/ML IV BOLUS
INTRAVENOUS | Status: AC
  Filled 2021-01-09: qty 20

## 2021-01-09 SURGICAL SUPPLY — 57 items
BLADE AVERAGE 25X9 (BLADE) IMPLANT
BLADE HEX COATED 2.75 (ELECTRODE) ×2 IMPLANT
BLADE SURG 15 STRL LF DISP TIS (BLADE) ×1 IMPLANT
BLADE SURG 15 STRL SS (BLADE) ×1
BNDG ELASTIC 3X5.8 VLCR STR LF (GAUZE/BANDAGES/DRESSINGS) ×2 IMPLANT
BNDG ELASTIC 4X5.8 VLCR STR LF (GAUZE/BANDAGES/DRESSINGS) ×4 IMPLANT
BNDG ESMARK 4X9 LF (GAUZE/BANDAGES/DRESSINGS) ×2 IMPLANT
BNDG GAUZE ELAST 4 BULKY (GAUZE/BANDAGES/DRESSINGS) ×2 IMPLANT
BUR MICA 2X12 (BURR) ×1 IMPLANT
BURR MICA 2X12 (BURR) ×2
CHLORAPREP W/TINT 26 (MISCELLANEOUS) ×2 IMPLANT
COVER BACK TABLE 60X90IN (DRAPES) ×2 IMPLANT
COVER WAND RF STERILE (DRAPES) IMPLANT
CUFF TOURN SGL QUICK 18X4 (TOURNIQUET CUFF) ×2 IMPLANT
DRAPE EXTREMITY T 121X128X90 (DISPOSABLE) ×2 IMPLANT
DRAPE IMP U-DRAPE 54X76 (DRAPES) ×2 IMPLANT
DRAPE OEC MINIVIEW 54X84 (DRAPES) ×2 IMPLANT
DRAPE SHEET LG 3/4 BI-LAMINATE (DRAPES) ×2 IMPLANT
DRAPE U-SHAPE 47X51 STRL (DRAPES) ×2 IMPLANT
DRSG EMULSION OIL 3X3 NADH (GAUZE/BANDAGES/DRESSINGS) ×2 IMPLANT
DRSG OPSITE POSTOP 4X6 (GAUZE/BANDAGES/DRESSINGS) ×2 IMPLANT
DRSG PAD ABDOMINAL 8X10 ST (GAUZE/BANDAGES/DRESSINGS) IMPLANT
ELECT REM PT RETURN 15FT ADLT (MISCELLANEOUS) ×2 IMPLANT
GAUZE 4X4 16PLY RFD (DISPOSABLE) IMPLANT
GAUZE SPONGE 4X4 12PLY STRL (GAUZE/BANDAGES/DRESSINGS) ×2 IMPLANT
GLOVE SRG 8 PF TXTR STRL LF DI (GLOVE) ×1 IMPLANT
GLOVE SURG ENC MOIS LTX SZ7.5 (GLOVE) ×2 IMPLANT
GLOVE SURG UNDER POLY LF SZ8 (GLOVE) ×1
GOWN STRL REUS W/ TWL LRG LVL3 (GOWN DISPOSABLE) ×1 IMPLANT
GOWN STRL REUS W/ TWL XL LVL3 (GOWN DISPOSABLE) ×1 IMPLANT
GOWN STRL REUS W/TWL LRG LVL3 (GOWN DISPOSABLE) ×1
GOWN STRL REUS W/TWL XL LVL3 (GOWN DISPOSABLE) ×1
KIT BASIN OR (CUSTOM PROCEDURE TRAY) ×2 IMPLANT
NEEDLE HYPO 25X1 1.5 SAFETY (NEEDLE) ×2 IMPLANT
NS IRRIG 1000ML POUR BTL (IV SOLUTION) ×2 IMPLANT
PADDING CAST ABS 4INX4YD NS (CAST SUPPLIES) ×1
PADDING CAST ABS COTTON 4X4 ST (CAST SUPPLIES) ×1 IMPLANT
PENCIL SMOKE EVACUATOR (MISCELLANEOUS) ×2 IMPLANT
SCREW HDLS MINI 2.5X18 (Screw) ×2 IMPLANT
SLEEVE SCD COMPRESS KNEE MED (STOCKING) ×2 IMPLANT
STOCKINETTE 4X48 STRL (DRAPES) ×2 IMPLANT
STOCKINETTE 6  STRL (DRAPES) ×1
STOCKINETTE 6 STRL (DRAPES) ×1 IMPLANT
SUT ETHILON 4 0 PS 2 18 (SUTURE) IMPLANT
SUT MNCRL AB 3-0 PS2 18 (SUTURE) IMPLANT
SUT MNCRL AB 4-0 PS2 18 (SUTURE) IMPLANT
SUT MON AB 5-0 PS2 18 (SUTURE) IMPLANT
SUT VIC AB 3-0 FS2 27 (SUTURE) ×2 IMPLANT
SUT VICRYL 4-0 PS2 18IN ABS (SUTURE) ×2 IMPLANT
SYR 30ML LL (SYRINGE) ×2 IMPLANT
SYR BULB EAR ULCER 3OZ GRN STR (SYRINGE) ×2 IMPLANT
SYR CONTROL 10ML LL (SYRINGE) ×2 IMPLANT
TOWEL OR 17X26 10 PK STRL BLUE (TOWEL DISPOSABLE) ×2 IMPLANT
TUBE SURG IRRIGATION (TUBING) ×2 IMPLANT
UNDERPAD 30X36 HEAVY ABSORB (UNDERPADS AND DIAPERS) ×2 IMPLANT
WIRE GUIDE 4INX.035 (WIRE) ×2 IMPLANT
YANKAUER SUCT BULB TIP NO VENT (SUCTIONS) ×2 IMPLANT

## 2021-01-09 NOTE — Anesthesia Procedure Notes (Signed)
Procedure Name: MAC Date/Time: 01/09/2021 1:15 PM Performed by: Niel Hummer, CRNA Pre-anesthesia Checklist: Patient identified, Emergency Drugs available, Suction available and Patient being monitored Oxygen Delivery Method: Simple face mask

## 2021-01-09 NOTE — Discharge Instructions (Signed)
  After Surgery Instructions   1) If you are recuperating from surgery anywhere other than home, please be sure to leave us the number where you can be reached.  2) Go directly home and rest.  3) Keep the operated foot(feet) elevated six inches above the hip when sitting or lying down. This will help control swelling and pain.  4) Support the elevated foot and leg with pillows. DO NOT PLACE PILLOWS UNDER THE KNEE.  5) DO NOT REMOVE or get your bandages WET, unless you were given different instructions by your doctor to do so. This increases the risk of infection.  6) Wear your surgical shoe or surgical boot at all times when you are up on your feet.  7) A limited amount of pain and swelling may occur. The skin may take on a bruised appearance. DO NOT BE ALARMED, THIS IS NORMAL.  8) For slight pain and swelling, apply an ice pack directly over the bandages for 15 minutes only out of each hour of the day. Continue until seen in the office for your first post op visit. DO NOT APPLY ANY FORM OF HEAT TO THE AREA.  9) Have prescriptions filled immediately and take as directed.  10) Drink lots of liquids, water and juice to stay hydrated.  11) CALL IMMEDIATELY IF:  *Bleeding continues until the following day of surgery  *Pain increases and/or does not respond to medication  *Bandages or cast appears to tight  *If your bandage gets wet  *Trip, fall or stump your surgical foot  *If your temperature goes above 101  *If you have ANY questions at all  12) You are expected to be weightbearing after your surgery.   If you need to reach the nurse for any reason, please call: [place]: [phone] [place]: [phone] [place]: [phone]  

## 2021-01-09 NOTE — Interval H&P Note (Signed)
History and Physical Interval Note:  01/09/2021 12:36 PM  Tiffany Salazar  has presented today for surgery, with the diagnosis of Tailor's bunion.  The various methods of treatment have been discussed with the patient and family. After consideration of risks, benefits and other options for treatment, the patient has consented to  Procedure(s): TAILOR's BUNIONECTOMY RIGHT (Right) as a surgical intervention.  The patient's history has been reviewed, patient examined, no change in status, stable for surgery.  I have reviewed the patient's chart and labs.  Questions were answered to the patient's satisfaction.     Park Liter

## 2021-01-09 NOTE — Anesthesia Postprocedure Evaluation (Signed)
Anesthesia Post Note  Patient: Tiffany Salazar  Procedure(s) Performed: Emmaline Life RIGHT (Right Toe)     Patient location during evaluation: PACU Anesthesia Type: MAC Level of consciousness: awake and alert Pain management: pain level controlled Vital Signs Assessment: post-procedure vital signs reviewed and stable Respiratory status: spontaneous breathing, nonlabored ventilation, respiratory function stable and patient connected to nasal cannula oxygen Cardiovascular status: stable and blood pressure returned to baseline Postop Assessment: no apparent nausea or vomiting Anesthetic complications: no   No complications documented.  Last Vitals:  Vitals:   01/09/21 1430 01/09/21 1445  BP: 121/71 (!) 111/54  Pulse: (!) 50 (!) 44  Resp: 19 11  Temp:  36.9 C  SpO2: 100% 97%    Last Pain:  Vitals:   01/09/21 1445  TempSrc:   PainSc: 0-No pain                 Jamil Castillo S

## 2021-01-09 NOTE — Anesthesia Preprocedure Evaluation (Signed)
Anesthesia Evaluation  Patient identified by MRN, date of birth, ID band Patient awake    Reviewed: Allergy & Precautions, H&P , NPO status , Patient's Chart, lab work & pertinent test results  Airway Mallampati: II   Neck ROM: full    Dental   Pulmonary asthma , Current Smoker,    breath sounds clear to auscultation       Cardiovascular + Valvular Problems/Murmurs  Rhythm:regular Rate:Normal  S/p tricuspid valve replacement   Neuro/Psych PSYCHIATRIC DISORDERS Depression    GI/Hepatic (+)     substance abuse  , Hepatitis -, C  Endo/Other    Renal/GU      Musculoskeletal   Abdominal   Peds  Hematology   Anesthesia Other Findings   Reproductive/Obstetrics                             Anesthesia Physical Anesthesia Plan  ASA: II  Anesthesia Plan: MAC   Post-op Pain Management:    Induction: Intravenous  PONV Risk Score and Plan: 1 and Propofol infusion, Ondansetron, Midazolam and Treatment may vary due to age or medical condition  Airway Management Planned: Simple Face Mask  Additional Equipment:   Intra-op Plan:   Post-operative Plan:   Informed Consent: I have reviewed the patients History and Physical, chart, labs and discussed the procedure including the risks, benefits and alternatives for the proposed anesthesia with the patient or authorized representative who has indicated his/her understanding and acceptance.     Dental advisory given  Plan Discussed with: CRNA, Anesthesiologist and Surgeon  Anesthesia Plan Comments:         Anesthesia Quick Evaluation

## 2021-01-09 NOTE — Op Note (Signed)
  Patient Name: Tiffany Salazar DOB: 1981-03-20  MRN: [id number]   Date of Surgery: 01/09/2021  Surgeon: Dr. Hardie Pulley, DPM Assistants: none  Pre-operative Diagnosis:  Tailor's bunion right Post-operative Diagnosis:  Same Procedures:  1) tailor's bunionectomy with osteotomy and screw fixation Pathology/Specimens: * No specimens in log * Anesthesia: MAC local Hemostasis:  Total Tourniquet Time Documented: Calf (Right) - 22 minutes Total: Calf (Right) - 22 minutes  Estimated Blood Loss: 36m  Materials:  Implant Name Type Inv. Item Serial No. Manufacturer Lot No. LRB No. Used Action  headless screw    [employer]  Right 1 Implanted  2.5 mm Stryker screw size 18  Medications: 149mmarcaine 0.2.5%lain Complications: none  Indications for Procedure:  This is a [age].o. female with a persistent tailor's bunion deformity causing difficulty with wearing shoe gear.  She presents for surgical correction.  All risk benefits alternatives were discussed the patient no guarantees given.   Procedure in Detail: Patient was identified in pre-operative holding area. Formal consent was signed and the right lower extremity was marked. Patient was brought back to the operating room. Anesthesia was induced. The extremity was prepped and draped in the usual sterile fashion. Timeout was taken to confirm patient name, laterality, and procedure prior to incision.   Attention was then directed to the right foot.  A small incision was made over the lateral aspect of the fifth metatarsal.  A ProStep bur was used to perform an osteotomy of the fifth metatarsal.  This was then translated medially.  The osteotomy was held in position and fixated with a K wire.  Fluoroscopy was used to confirm positioning.  This was then overdrilled with a 2.5 millimeter screw to fixate the osteotomy.  The final construct appeared stable.  The wounds were copiously irrigated with Angiocath needle and closed with 4-0  nylon.  The foot was then dressed with OpSite honeycomb dressing and Ace bandage. Patient tolerated the procedure well.   Disposition: Following a period of post-operative monitoring, patient will be transferred home.

## 2021-01-09 NOTE — Assessment & Plan Note (Addendum)
Given the low risk nature of scheduled operation, patient determined to be appropriate to proceed with scheduled operation. Believe this is low risk for cardiac event during operation.  Advised patient to follow-up with cardiology for annual evaluation and follow-up of tricuspid valve replacement.  Patient denies any difficulty breathing as well as any chest pain today. Patient has physical and exam form from surgeon.  This form was completed today.  Given history of endocarditis of tricuspid valve, reviewed recommendations for prophylactic antibiotics and given the nature of the surgery, patient does not need prophylactic antibiotic treatment.

## 2021-01-09 NOTE — Transfer of Care (Signed)
Immediate Anesthesia Transfer of Care Note  Patient: Tiffany Salazar  Procedure(s) Performed: Emmaline Life RIGHT (Right Toe)  Patient Location: PACU  Anesthesia Type:MAC  Level of Consciousness: awake, alert  and oriented  Airway & Oxygen Therapy: Patient Spontanous Breathing and Patient connected to face mask oxygen  Post-op Assessment: Report given to RN, Post -op Vital signs reviewed and stable and Patient moving all extremities X 4  Post vital signs: Reviewed and stable  Last Vitals:  Vitals Value Taken Time  BP    Temp    Pulse 55 01/09/21 1409  Resp 15 01/09/21 1409  SpO2 99 % 01/09/21 1409  Vitals shown include unvalidated device data.  Last Pain:  Vitals:   01/09/21 1219  TempSrc:   PainSc: 0-No pain         Complications: No complications documented.

## 2021-01-10 ENCOUNTER — Encounter (HOSPITAL_COMMUNITY): Payer: Self-pay | Admitting: Podiatry

## 2021-01-15 ENCOUNTER — Other Ambulatory Visit: Payer: Self-pay

## 2021-01-15 ENCOUNTER — Ambulatory Visit (INDEPENDENT_AMBULATORY_CARE_PROVIDER_SITE_OTHER): Payer: Medicaid Other

## 2021-01-15 ENCOUNTER — Ambulatory Visit (INDEPENDENT_AMBULATORY_CARE_PROVIDER_SITE_OTHER): Payer: Medicaid Other | Admitting: Podiatry

## 2021-01-15 ENCOUNTER — Encounter: Payer: Medicaid Other | Admitting: Podiatry

## 2021-01-15 DIAGNOSIS — M21621 Bunionette of right foot: Secondary | ICD-10-CM | POA: Diagnosis not present

## 2021-01-15 NOTE — Progress Notes (Signed)
  Subjective:  Patient ID: Tiffany Salazar, female    DOB: November 15, 1980,  MRN: [id number]  Chief Complaint  Patient presents with  . Routine Post Op    POV #1 DOS 01/09/2021 RT FOOT CORRECTION OF TAILORS BUNION Denies any f/c/n/v. Manageable pain lvls.    DOS: 01/09/21 Procedure: Right foot correction of tailor's bunion  40 y.o. female presents with the above complaint. History confirmed with patient.   Objective:  Physical Exam: tenderness at the surgical site, local edema noted and calf supple, nontender. Incision: healing well, no significant drainage, no dehiscence, no significant erythema  No images are attached to the encounter.  Radiographs: X-ray of the right foot: reduced Tailor's bunion. Screw is rather dorsal and lacks significant bone purchase.  Assessment:   1. Tailor's bunionette, right     Plan:  Patient was evaluated and treated and all questions answered.  Post-operative State -XR taken - capital fragment in good position but screw appears to be holding bone in place but with minimal bone purchase. Unsure if screw position changed c/t immediate post-op. May benefit from screw removal at a later date if symptomatic. Discussed with patient -Sutures removed today. Ok to shower. -WBAT in boot.   Return in about 2 weeks (around 01/29/2021) for Post-Op (with XRs).

## 2021-01-29 ENCOUNTER — Ambulatory Visit (INDEPENDENT_AMBULATORY_CARE_PROVIDER_SITE_OTHER): Payer: Medicaid Other | Admitting: Podiatry

## 2021-01-29 ENCOUNTER — Other Ambulatory Visit: Payer: Self-pay

## 2021-01-29 ENCOUNTER — Encounter: Payer: Medicaid Other | Admitting: Podiatry

## 2021-01-29 ENCOUNTER — Ambulatory Visit (INDEPENDENT_AMBULATORY_CARE_PROVIDER_SITE_OTHER): Payer: Medicaid Other

## 2021-01-29 DIAGNOSIS — Z9889 Other specified postprocedural states: Secondary | ICD-10-CM

## 2021-01-29 DIAGNOSIS — M21621 Bunionette of right foot: Secondary | ICD-10-CM | POA: Diagnosis not present

## 2021-01-29 NOTE — Progress Notes (Signed)
  Subjective:  Patient ID: Tiffany Salazar, female    DOB: 1980-09-15,  MRN: [id number]  Chief Complaint  Patient presents with  . Routine Post Op    POV #2 DOS 01/09/2021 RT FOOT CORRECTION OF TAILORS BUNION. Denies f/c/n/v. Manageable pain lvls. Reports this past week feeling as if something had popped and then something was moving around this past weekend but reports sensations have gone away.    DOS: 01/09/21 Procedure: Right foot correction of tailor's bunion  40 y.o. female presents with the above complaint. History confirmed with patient.   Objective:  Physical Exam: tenderness at the surgical site, local edema noted and calf supple, nontender. Incision: healing well, no significant drainage, no dehiscence, no significant erythema  No images are attached to the encounter.  Radiographs: X-ray of the right foot: reduced Tailor's bunion. Screw does appear to be outside the bone  Assessment:   1. Post-operative state     Plan:  Patient was evaluated and treated and all questions answered.  Post-operative State -XR taken - capital fragment in good position. Screw does appear to have moved. Will benefit from extraction at a later date. -Compression anklet dispensed. -WBAT in boot.  Return in about 2 weeks (around 02/12/2021) for Post-Op (with XRs).

## 2021-02-01 ENCOUNTER — Other Ambulatory Visit (HOSPITAL_BASED_OUTPATIENT_CLINIC_OR_DEPARTMENT_OTHER): Payer: Self-pay

## 2021-02-01 ENCOUNTER — Ambulatory Visit: Payer: Medicaid Other | Attending: Internal Medicine

## 2021-02-01 ENCOUNTER — Emergency Department (HOSPITAL_BASED_OUTPATIENT_CLINIC_OR_DEPARTMENT_OTHER)
Admission: EM | Admit: 2021-02-01 | Discharge: 2021-02-01 | Disposition: A | Discharge status: Home / Self Care | Payer: Medicaid Other | Attending: Emergency Medicine | Admitting: Emergency Medicine

## 2021-02-01 ENCOUNTER — Other Ambulatory Visit: Payer: Self-pay

## 2021-02-01 ENCOUNTER — Encounter (HOSPITAL_BASED_OUTPATIENT_CLINIC_OR_DEPARTMENT_OTHER): Payer: Self-pay | Admitting: Emergency Medicine

## 2021-02-01 DIAGNOSIS — L237 Allergic contact dermatitis due to plants, except food: Secondary | ICD-10-CM | POA: Insufficient documentation

## 2021-02-01 DIAGNOSIS — F17210 Nicotine dependence, cigarettes, uncomplicated: Secondary | ICD-10-CM | POA: Insufficient documentation

## 2021-02-01 DIAGNOSIS — L255 Unspecified contact dermatitis due to plants, except food: Secondary | ICD-10-CM

## 2021-02-01 DIAGNOSIS — J45909 Unspecified asthma, uncomplicated: Secondary | ICD-10-CM | POA: Diagnosis not present

## 2021-02-01 MED ORDER — TRIAMCINOLONE ACETONIDE 0.1 % EX CREA: 1.0000 "application " | TOPICAL_CREAM | Freq: Two times a day (BID) | CUTANEOUS | 0 refills | Status: DC

## 2021-02-01 MED ORDER — PREDNISONE 20 MG PO TABS: ORAL_TABLET | ORAL | 0 refills | Status: AC

## 2021-02-01 NOTE — ED Provider Notes (Signed)
[facility] EMERGENCY DEPT Provider Note   CSN: [id number] Arrival date & time: 02/01/21  1545     History Chief Complaint  Patient presents with   Poison Ivy    Tiffany Salazar is a 40 y.o. female.  The history is provided by the patient.  Poison Lajoyce Corners This is a new problem. Episode onset: 2 weeks ago. The problem has not changed since onset.Pertinent negatives include no chest pain, no abdominal pain, no headaches and no shortness of breath. Nothing aggravates the symptoms. Nothing relieves the symptoms. Treatments tried: calamine. The treatment provided no relief.      Past Medical History:  Diagnosis Date   AMA (advanced maternal age) multigravida 35+, unspecified trimester 09/20/2018   Anemia    Asthma    seasonal   Hepatitis C    Hep C   MRSA infection 03/21/2015   Pregnancy complicated by subutex maintenance, antepartum (HCC) 10/20/2018   Subdural hematoma (HCC) 12/26/2013   Substance use disorder    Tricuspid valve replaced    Unwanted fertility 02/09/2019   Signed BTL papers    Patient Active Problem List   Diagnosis Date Noted   Tailor's bunionette, right    Encounter for preoperative assessment 06/24/2020   Reactive lymphadenopathy 06/06/2020   Hepatitis C, chronic, maternal, antepartum (HCC) 01/05/2019   History of tricuspid valve replacement with bioprosthetic valve 09/20/2018   Tobacco use disorder 08/02/2018   Hepatitis C antibody positive in blood    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   Depression 11/13/2016   Substance use disorder 12/30/2013    Past Surgical History:  Procedure Laterality Date   BUNIONECTOMY Bilateral    BUNIONECTOMY Right 01/09/2021   Procedure: TAILOR's BUNIONECTOMY RIGHT;  Surgeon: Park Liter, DPM;  Location: [facility] ORS;  Service: Podiatry;  Laterality: Right;   CARDIAC SURGERY     CESAREAN SECTION  x 3   CESAREAN SECTION WITH BILATERAL TUBAL LIGATION Bilateral 03/29/2019   Procedure: CESAREAN SECTION  WITH BILATERAL TUBAL LIGATION;  Surgeon: Allie Bossier, MD;  Location: [facility] ORS;  Service: Obstetrics;  Laterality: Bilateral;   TEE WITHOUT CARDIOVERSION N/A 03/23/2018   Procedure: TRANSESOPHAGEAL ECHOCARDIOGRAM (TEE);  Surgeon: Chrystie Nose, MD;  Location: [facility] ENDOSCOPY;  Service: Cardiovascular;  Laterality: N/A;   TEE WITHOUT CARDIOVERSION N/A 03/29/2018   Procedure: TRANSESOPHAGEAL ECHOCARDIOGRAM (TEE);  Surgeon: Alleen Borne, MD;  Location: [facility] OR;  Service: Open Heart Surgery;  Laterality: N/A;   TRICUSPID VALVE REPLACEMENT N/A 03/29/2018   Procedure: TRICUSPID VALVE REPLACEMENT Using 27 mm Magna Ease Mitral Valve Pericardial Bioprosthesis;  Surgeon: Alleen Borne, MD;  Location: [facility] OR;  Service: Open Heart Surgery;  Laterality: N/A;   TUBAL LIGATION       OB History     Gravida  7   Para  4   Term  4   Preterm      AB  3   Living  4      SAB  1   IAB  2   Ectopic      Multiple      Live Births  4           Family History  Problem Relation Age of Onset   Thyroid disease Mother    Breast cancer Mother 44       breast   Alcohol abuse Father     Social History   Tobacco Use   Smoking status: Every Day    Packs/day: 1.00  Pack years: 0.00    Types: E-cigarettes, Cigarettes   Smokeless tobacco: Current   Tobacco comments:    quit smoking cigarretted; vape  Vaping Use   Vaping Use: Some days  Substance Use Topics   Alcohol use: Not Currently    Alcohol/week: 0.0 standard drinks    Comment: Pt stated she has started goint ot rehab clinic   Drug use: Not Currently    Types: IV, Cocaine, Heroin, Marijuana    Comment: March 20, 2018 last time used drugs. subutex    Home Medications Prior to Admission medications   Medication Sig Start Date End Date Taking? Authorizing Provider  amphetamine-dextroamphetamine (ADDERALL XR) 30 MG 24 hr capsule Take 30 mg by mouth in the morning and at bedtime.   Yes [provider]  buprenorphine  (SUBUTEX) 8 MG SUBL SL tablet Place 8 mg under the tongue 3 (three) times daily.    Yes [provider]  predniSONE (DELTASONE) 20 MG tablet Take 3 tablets (60 mg total) by mouth daily for 7 days, THEN 2 tablets (40 mg total) daily for 7 days, THEN 1 tablet (20 mg total) daily for 7 days. 02/01/21 02/22/21 Yes Koleen Distance, MD  triamcinolone cream (KENALOG) 0.1 % Apply 1 application topically 2 (two) times daily. 02/01/21  Yes Koleen Distance, MD  cephALEXin (KEFLEX) 500 MG capsule Take 1 tablet by mouth tonight, then one tomorrow AM and one in PM 01/09/21   Park Liter, DPM  ondansetron (ZOFRAN) 4 MG tablet Take 1 tablet (4 mg total) by mouth every 8 (eight) hours as needed for nausea or vomiting. 01/09/21   Park Liter, DPM  oxyCODONE (ROXICODONE) 5 MG immediate release tablet Take 1 tablet (5 mg total) by mouth every 8 (eight) hours as needed. 01/09/21 01/09/22  Park Liter, DPM  VENTOLIN HFA 108 (90 Base) MCG/ACT inhaler INHALE TWO PUFFS BY MOUTH EVERY 6 HOURS AS NEEDED FOR WHEEZING OR SHORTNESS OF BREATH 11/07/19   Levie Heritage, DO    Allergies    Gentamicin and Vancomycin  Review of Systems   Review of Systems  Constitutional:  Negative for chills and fever.  HENT:  Negative for ear pain and sore throat.   Eyes:  Negative for pain and visual disturbance.  Respiratory:  Negative for cough and shortness of breath.   Cardiovascular:  Negative for chest pain and palpitations.  Gastrointestinal:  Negative for abdominal pain and vomiting.  Genitourinary:  Negative for dysuria and hematuria.  Musculoskeletal:  Negative for arthralgias and back pain.  Skin:  Negative for color change and rash.  Neurological:  Negative for seizures, syncope and headaches.  All other systems reviewed and are negative.  Physical Exam Updated Vital Signs BP 112/77 (BP Location: Left Arm)   Pulse 60   Temp 98.5 F (36.9 C)   Resp 18   Ht 5\' 8"  (1.727 m)   Wt 90.7 kg   LMP 01/27/2021  (Approximate)   SpO2 97%   BMI 30.41 kg/m   Physical Exam Vitals and nursing note reviewed.  HENT:     Head: Normocephalic and atraumatic.  Eyes:     General: No scleral icterus. Pulmonary:     Effort: Pulmonary effort is normal. No respiratory distress.  Musculoskeletal:     Cervical back: Normal range of motion.  Skin:    General: Skin is warm and dry.     Comments: Scattered erythematous papules mainly at the dorsal forearms and ankles.  Some are excoriated but none appear secondarily infected.  Neurological:     General: No focal deficit present.     Mental Status: She is alert and oriented to person, place, and time.  Psychiatric:        Mood and Affect: Mood normal.    ED Results / Procedures / Treatments   Labs (all labs ordered are listed, but only abnormal results are displayed) Labs Reviewed - No data to display  EKG None  Radiology No results found.  Procedures Procedures   Medications Ordered in ED Medications - No data to display  ED Course  I have reviewed the triage vital signs and the nursing notes.  Pertinent labs & imaging results that were available during my care of the patient were reviewed by me and considered in my medical decision making (see chart for details).    MDM Rules/Calculators/A&P                          FLORENTINA MARQUART has had an exposure to poison sumac and has ongoing skin irritation.  She has experienced this in the past and typically requires steroids.  She has tolerated them well.  We spoke about the need for a more prolonged course of steroids.  She will be given topical and oral steroids.  Symptomatic treatment instructions were given.  Return precautions were discussed as well. Final Clinical Impression(s) / ED Diagnoses Final diagnoses:  Dermatitis due to plants, including poison ivy, sumac, and oak    Rx / DC Orders ED Discharge Orders          Ordered    triamcinolone cream (KENALOG) 0.1 %  2 times daily         02/01/21 1616    predniSONE (DELTASONE) 20 MG tablet        02/01/21 1616             Koleen Distance, MD 02/01/21 [phone]

## 2021-02-01 NOTE — ED Triage Notes (Signed)
Pt has had poison ivy for the past two week both arms and legs.

## 2021-02-12 ENCOUNTER — Ambulatory Visit (INDEPENDENT_AMBULATORY_CARE_PROVIDER_SITE_OTHER): Payer: Medicaid Other | Admitting: Podiatry

## 2021-02-12 DIAGNOSIS — Z9889 Other specified postprocedural states: Secondary | ICD-10-CM

## 2021-02-12 DIAGNOSIS — Z5329 Procedure and treatment not carried out because of patient's decision for other reasons: Secondary | ICD-10-CM

## 2021-02-12 NOTE — Progress Notes (Signed)
No show for post-op appt 

## 2021-02-21 IMAGING — US US MFM OB FOLLOW UP
1 series · 12 of 28 positions shown · non-contrast
Comparison: none

[Series 1: us mfm ob follow up · 56 acquisitions, 12 frames shown]
[im 3/56]
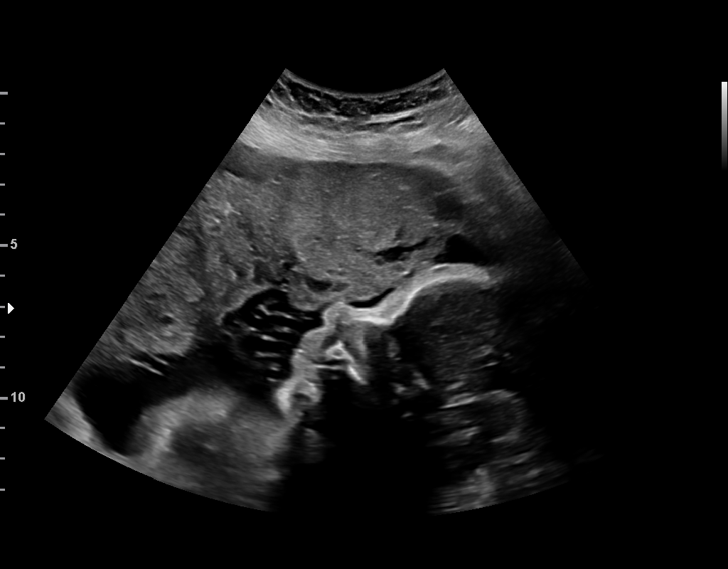
[im 7/56]
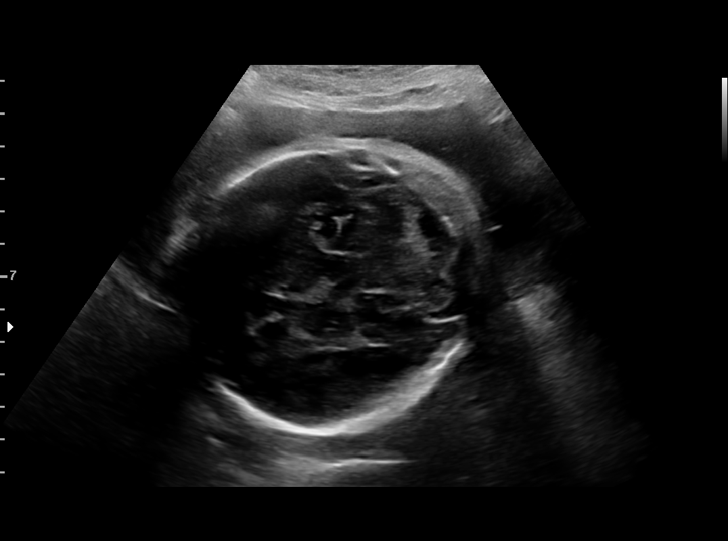
[im 11/56]
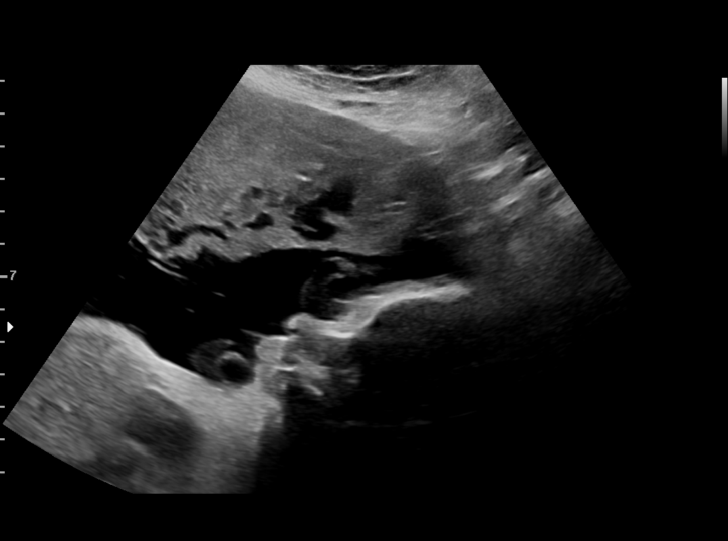
[im 17/56]
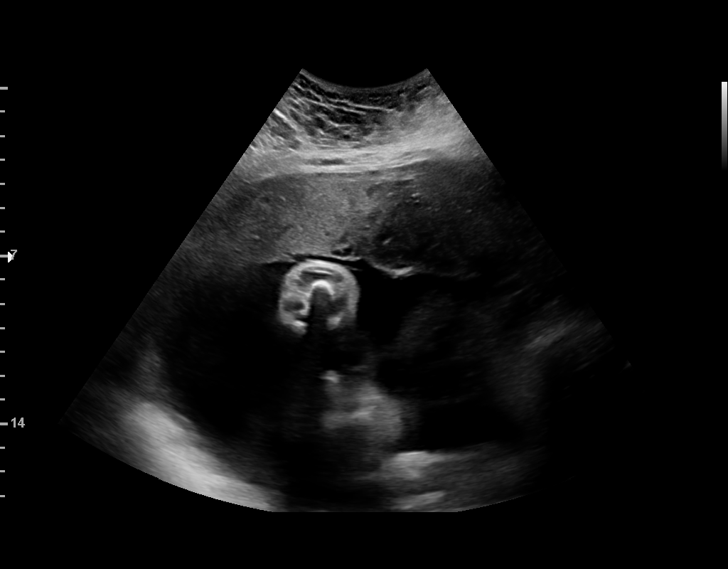
[im 21/56]
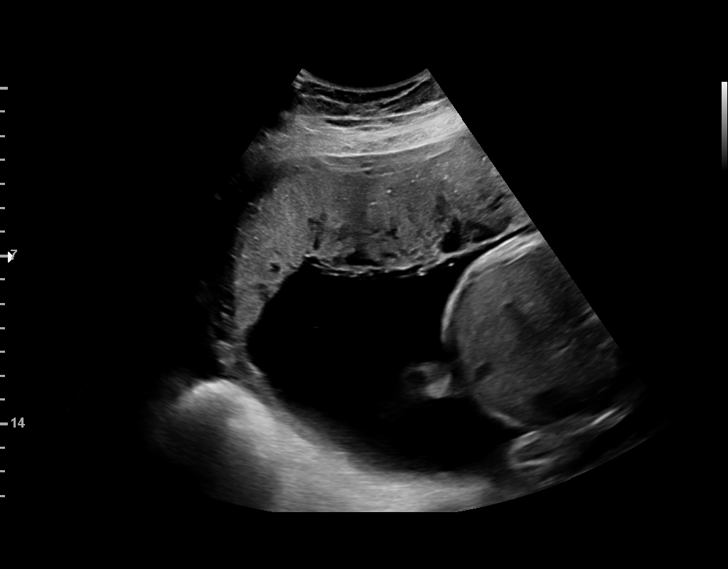
[im 25/56]
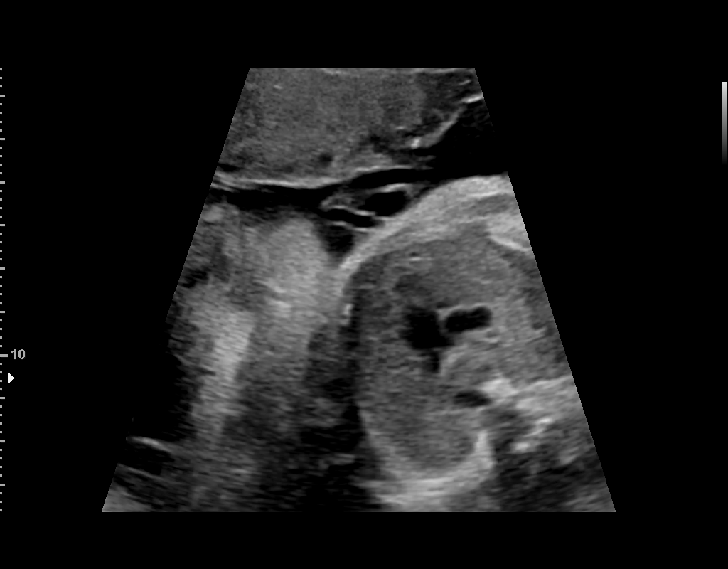
[im 31/56]
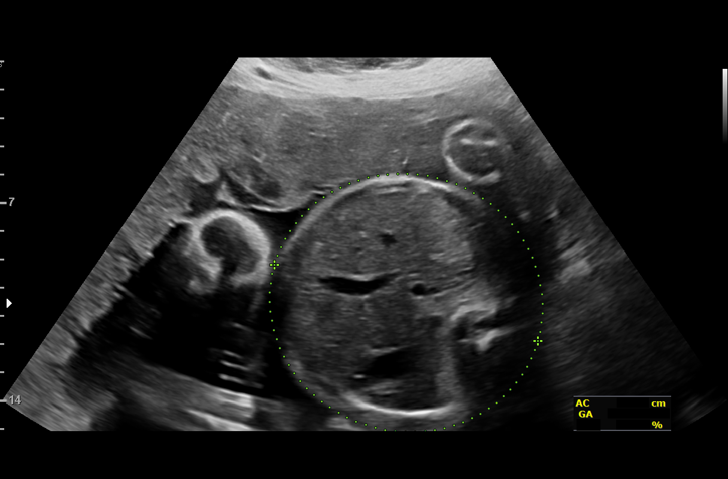
[im 35/56]
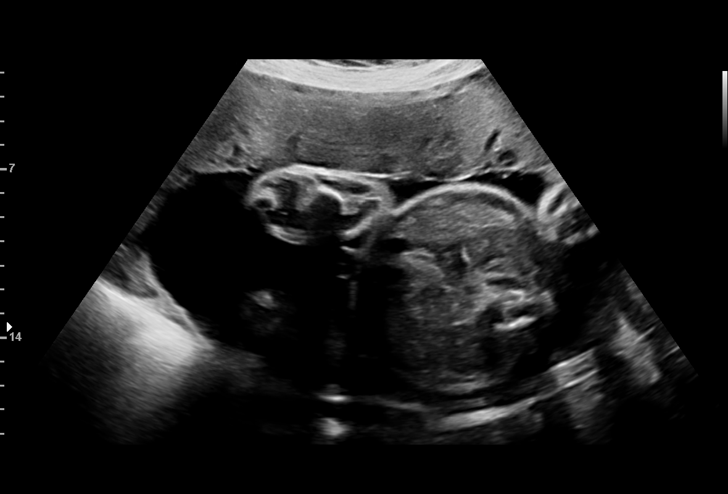
[im 39/56]
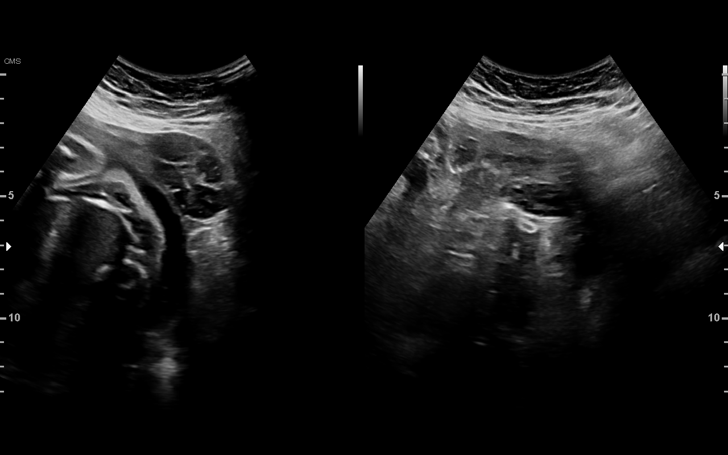
[im 45/56]
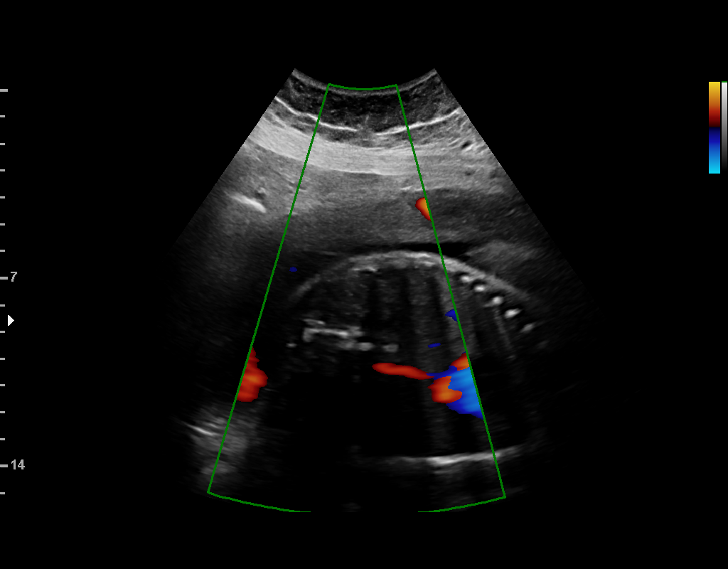
[im 49/56]
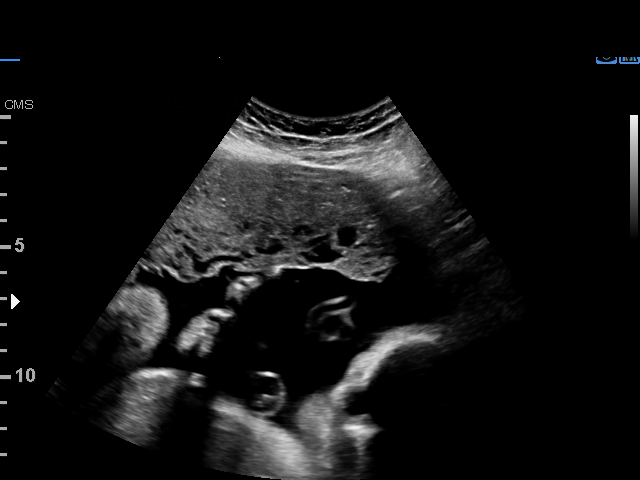
[im 53/56]
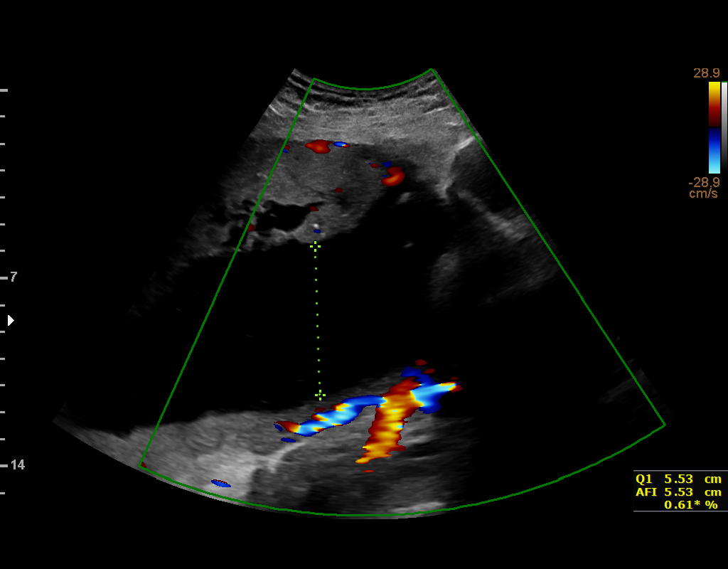

[12 of 28 positions shown; findings below may reference images not displayed]

AGUAS
 ----------------------------------------------------------------------

 ----------------------------------------------------------------------
Indications

  Encounter for other antenatal screening
  follow-up
  Advanced maternal age multigravida 35+,
  third trimester (low risk NIPS, neg AFP)
  Previous cesarean delivery, antepartum (x3)
  Pregnancy comlicated by subutex                O99.320, F11.20,
  maintenance, antepartum
  Substance abuse affecting pregnancy,
  antepartum
  Poor obstetrical history (Hx Tricuspid valve
  replacement)
  Obesity complicating pregnancy, third
  trimester (pregravid BMI 31)
  Tobacco use complicating pregnancy, third
  trimester
  Chronic Hepatitis C complicating pregnancy,
  antepartum
  32 weeks gestation of pregnancy
 ----------------------------------------------------------------------
Vital Signs

                                                Height:        5'7"
Fetal Evaluation

 Num Of Fetuses:          1
 Fetal Heart Rate(bpm):   135
 Cardiac Activity:        Observed
 Presentation:            Cephalic
 Placenta:                Anterior
 P. Cord Insertion:       Visualized
 Amniotic Fluid
 AFI FV:      Within normal limits

 AFI Sum(cm)     %Tile       Largest Pocket(cm)
 20.79           79

 RUQ(cm)       RLQ(cm)       LUQ(cm)        LLQ(cm)

Biometry

 BPD:      85.4  mm     G. Age:  34w 3d         94  %    CI:        72.31   %    70 - 86
                                                         FL/HC:       17.9  %    19.1 -
 HC:      319.5  mm     G. Age:  36w 0d         96  %    HC/AC:       1.10       0.96 -
 AC:      291.2  mm     G. Age:  33w 1d         76  %    FL/BPD:      66.9  %    71 - 87
 FL:       57.1  mm     G. Age:  30w 0d        < 3  %    FL/AC:       19.6  %    20 - 24
 HUM:      53.3  mm     G. Age:  31w 0d         29  %

 Est. FW:    7665   gm     4 lb 7 oz     65  %
OB History

 Gravidity:    7         Term:   3        Prem:   0        SAB:   1
 TOP:          2       Ectopic:  0        Living: 3
Gestational Age

 LMP:           32w 1d        Date:  06/29/18                 EDD:   04/05/19
 U/S Today:     33w 3d                                        EDD:   03/27/19
 Best:          32w 1d     Det. By:  LMP  (06/29/18)          EDD:   04/05/19
Anatomy

 Cranium:               Appears normal         Aortic Arch:            Previously seen
 Cavum:                 Appears normal         Ductal Arch:            Previously seen
 Ventricles:            Appears normal         Diaphragm:              Appears normal
 Choroid Plexus:        Previously seen        Stomach:                Appears normal, left
                                                                       sided
 Cerebellum:            Appears normal         Abdomen:                Appears normal
 Posterior Fossa:       Previously seen        Abdominal Wall:         Previously seen
 Nuchal Fold:           Previously seen        Cord Vessels:           Previously seen
 Face:                  Appears normal         Kidneys:                Appear normal
                        (orbits and profile)
 Lips:                  Appears normal         Bladder:                Appears normal
 Thoracic:              Appears normal         Spine:                  Previously seen
 Heart:                 Appears normal         Upper Extremities:      Previously seen
                        (4CH, axis, and
                        situs)
 RVOT:                  Appears normal         Lower Extremities:      Previously seen
 LVOT:                  Appears normal

 Other:  Technically difficult due to fetal position.
Cervix Uterus Adnexa

 Cervix
 Not visualized (advanced GA >70wks)

 Uterus
 No abnormality visualized.
 Left Ovary
 Within normal limits.

 Right Ovary
 Not visualized.

 Cul De Sac
 No free fluid seen.

 Adnexa
 No abnormality visualized.
Impression

 Amniotic fluid is normal and good fetal activity is seen. Fetal
 growth is appropriate for gestational age. Patient had
 screening for gestational diabetes today.
 BP 108/67 mm Hg.
Recommendations

 An appointment was made for her to return in 4 weeks for
 fetal growth assessment.
                 Jim, Lorraine

## 2021-02-27 ENCOUNTER — Encounter: Payer: Medicaid Other | Admitting: Physician Assistant

## 2021-03-01 NOTE — Progress Notes (Signed)
This encounter was created in error - please disregard.

## 2021-03-12 ENCOUNTER — Ambulatory Visit (INDEPENDENT_AMBULATORY_CARE_PROVIDER_SITE_OTHER): Payer: Medicaid Other | Admitting: Podiatry

## 2021-03-12 ENCOUNTER — Ambulatory Visit (INDEPENDENT_AMBULATORY_CARE_PROVIDER_SITE_OTHER): Payer: Medicaid Other

## 2021-03-12 ENCOUNTER — Other Ambulatory Visit: Payer: Self-pay

## 2021-03-12 DIAGNOSIS — M21619 Bunion of unspecified foot: Secondary | ICD-10-CM

## 2021-03-12 DIAGNOSIS — M21621 Bunionette of right foot: Secondary | ICD-10-CM | POA: Diagnosis not present

## 2021-03-12 DIAGNOSIS — Z5329 Procedure and treatment not carried out because of patient's decision for other reasons: Secondary | ICD-10-CM

## 2021-03-12 DIAGNOSIS — S90851A Superficial foreign body, right foot, initial encounter: Secondary | ICD-10-CM

## 2021-03-12 DIAGNOSIS — Z9889 Other specified postprocedural states: Secondary | ICD-10-CM | POA: Diagnosis not present

## 2021-03-12 DIAGNOSIS — M2011 Hallux valgus (acquired), right foot: Secondary | ICD-10-CM

## 2021-03-12 NOTE — Progress Notes (Signed)
  Subjective:  Patient ID: Tiffany Salazar, female    DOB: 27-Mar-1981,  MRN: [id number]  Chief Complaint  Patient presents with   Post-op Problem    Pt states she is having pain in her right foot near the 5th met. Pt thinks that the screw is coming out of her foot.    DOS: 01/09/21 Procedure: Right foot correction of tailor's bunion  40 y.o. female presents with the above complaint. History confirmed with patient.   Objective:  Physical Exam: tenderness at the surgical site, local edema noted and calf supple, nontender. Incision: healing well, no significant drainage, no dehiscence, no significant erythema  No images are attached to the encounter.  Radiographs: X-ray of the right foot: reduced Tailor's bunion. Screw does appear to be outside the bone  Assessment:   1. Post-operative state   2. Hallux valgus, right   3. Bunion   4. Foreign body in right foot, initial encounter     Plan:  Patient was evaluated and treated and all questions answered.  Post-operative State -XR taken - with dorsal displacement of screw -Discussed she would benefit from screw removal. Offered in office under local or surgical center w anesthesia. Pt prefers latter -Patient has failed all conservative therapy and wishes to proceed with surgical intervention. All risks, benefits, and alternatives discussed with patient. No guarantees given. Consent reviewed and signed by patient. -Planned procedures: removal of foreign body right -ASA 2 - Patient with mild systemic disease with no functional limitations -Post-op anticoagulation: chemoprophylaxis not indicated    No follow-ups on file.

## 2021-03-13 ENCOUNTER — Other Ambulatory Visit: Payer: Self-pay | Admitting: Podiatry

## 2021-03-13 ENCOUNTER — Encounter: Payer: Self-pay | Admitting: Podiatry

## 2021-03-13 ENCOUNTER — Telehealth: Payer: Self-pay | Admitting: *Deleted

## 2021-03-13 DIAGNOSIS — M795 Residual foreign body in soft tissue: Secondary | ICD-10-CM | POA: Diagnosis not present

## 2021-03-13 DIAGNOSIS — M60271 Foreign body granuloma of soft tissue, not elsewhere classified, right ankle and foot: Secondary | ICD-10-CM

## 2021-03-13 DIAGNOSIS — T8484XA Pain due to internal orthopedic prosthetic devices, implants and grafts, initial encounter: Secondary | ICD-10-CM | POA: Diagnosis not present

## 2021-03-13 DIAGNOSIS — Z4889 Encounter for other specified surgical aftercare: Secondary | ICD-10-CM | POA: Diagnosis not present

## 2021-03-13 MED ORDER — OXYCODONE HCL 5 MG PO TABS: 5.0000 mg | ORAL_TABLET | Freq: Three times a day (TID) | ORAL | 0 refills | Status: DC | PRN

## 2021-03-13 MED ORDER — CEPHALEXIN 500 MG PO CAPS: ORAL_CAPSULE | ORAL | 0 refills | Status: DC

## 2021-03-13 NOTE — Telephone Encounter (Signed)
Victorino Dike , pharmacist is calling to inform the physician that the patient was recently prescribed Buprenorphine -8mg  tablets 2 days ago by another doctor.Does he still want to complete the request for the oxycodone-5. Please advise.

## 2021-03-14 NOTE — Telephone Encounter (Signed)
Yes please inform

## 2021-03-14 NOTE — Telephone Encounter (Signed)
Returned call twice,line busy,will try again later.

## 2021-03-18 NOTE — Telephone Encounter (Signed)
Returned call to pharmacy to give ok per Dr Samuella Cota to continue to  fill prescription. They will fax changes for approval signature if needed.

## 2021-03-19 ENCOUNTER — Ambulatory Visit (INDEPENDENT_AMBULATORY_CARE_PROVIDER_SITE_OTHER): Payer: Medicaid Other

## 2021-03-19 ENCOUNTER — Ambulatory Visit (INDEPENDENT_AMBULATORY_CARE_PROVIDER_SITE_OTHER): Payer: Medicaid Other | Admitting: Podiatry

## 2021-03-19 ENCOUNTER — Other Ambulatory Visit: Payer: Self-pay

## 2021-03-19 DIAGNOSIS — M2011 Hallux valgus (acquired), right foot: Secondary | ICD-10-CM

## 2021-03-19 DIAGNOSIS — Z9889 Other specified postprocedural states: Secondary | ICD-10-CM

## 2021-03-19 NOTE — Progress Notes (Signed)
  Subjective:  Patient ID: Tiffany Salazar, female    DOB: 1981-06-17,  MRN: [id number]  Chief Complaint  Patient presents with   Routine Post Op    POV #1 DOS 03/13/2021 REMOVAL OF SCREW RT FOOT. Pt states she is a lot better since screw removal. Pt states her 38 yo son stepped on her right foot which caused some pain but not other complaints.     DOS: 03/13/21 Procedure: ROH right foot   40 y.o. female presents with the above complaint. History confirmed with patient. States when her son stepped on her foot it gapped the incision slightly  Objective:  Physical Exam: no tenderness at the surgical site, local edema noted, and calf supple, nontender. Incision: healing well, no significant drainage, no dehiscence, no significant erythema    Assessment:   1. Post-operative state     Plan:  Patient was evaluated and treated and all questions answered.  Post-operative State -XR reviewed with patient showing interval removal. Stable osteotomy without tailor's bunion -Ok to start showering at this time. Advised they cannot soak. -WBAT in normal shoegear -XRs needed at follow-up: 3 view Foot   Return in about 6 weeks (around 04/30/2021) for Post-Op (with XRs).

## 2021-03-29 ENCOUNTER — Other Ambulatory Visit: Payer: Self-pay

## 2021-03-29 ENCOUNTER — Ambulatory Visit (INDEPENDENT_AMBULATORY_CARE_PROVIDER_SITE_OTHER): Payer: Medicaid Other

## 2021-03-29 DIAGNOSIS — Z20822 Contact with and (suspected) exposure to covid-19: Secondary | ICD-10-CM

## 2021-03-29 NOTE — Progress Notes (Signed)
Patient presents to nurse clinic for COVID booster. Patient reports that earlier this week, she started having sore, scratchy throat and cough. Requested COVID testing.   Spoke with Dr. Obie Dredge who recommended that we proceed with COVID testing and hold off on booster until test results come back.   Performed nasal swab on patient. Delivered to lab for processing. Informed patient that results typically take 24-48 hours to come back.   Veronda Prude, RN

## 2021-03-30 LAB — NOVEL CORONAVIRUS, NAA: SARS-CoV-2, NAA: NOT DETECTED

## 2021-03-30 LAB — SARS-COV-2, NAA 2 DAY TAT

## 2021-04-02 ENCOUNTER — Encounter: Payer: Medicaid Other | Admitting: Podiatry

## 2021-04-30 ENCOUNTER — Ambulatory Visit (INDEPENDENT_AMBULATORY_CARE_PROVIDER_SITE_OTHER): Payer: Medicaid Other | Admitting: Podiatry

## 2021-04-30 DIAGNOSIS — Z5329 Procedure and treatment not carried out because of patient's decision for other reasons: Secondary | ICD-10-CM

## 2021-05-02 DIAGNOSIS — Z03818 Encounter for observation for suspected exposure to other biological agents ruled out: Secondary | ICD-10-CM | POA: Diagnosis not present

## 2021-05-23 ENCOUNTER — Ambulatory Visit (HOSPITAL_BASED_OUTPATIENT_CLINIC_OR_DEPARTMENT_OTHER): Payer: Medicaid Other | Admitting: Cardiovascular Disease

## 2021-06-03 ENCOUNTER — Other Ambulatory Visit: Payer: Self-pay

## 2021-06-03 ENCOUNTER — Ambulatory Visit (INDEPENDENT_AMBULATORY_CARE_PROVIDER_SITE_OTHER): Payer: Medicaid Other | Admitting: Cardiovascular Disease

## 2021-06-03 ENCOUNTER — Encounter (HOSPITAL_BASED_OUTPATIENT_CLINIC_OR_DEPARTMENT_OTHER): Payer: Self-pay | Admitting: Cardiovascular Disease

## 2021-06-03 DIAGNOSIS — L309 Dermatitis, unspecified: Secondary | ICD-10-CM | POA: Diagnosis not present

## 2021-06-03 DIAGNOSIS — I079 Rheumatic tricuspid valve disease, unspecified: Secondary | ICD-10-CM | POA: Diagnosis not present

## 2021-06-03 DIAGNOSIS — F1990 Other psychoactive substance use, unspecified, uncomplicated: Secondary | ICD-10-CM | POA: Diagnosis not present

## 2021-06-03 HISTORY — DX: Dermatitis, unspecified: L30.9

## 2021-06-03 MED ORDER — TRIAMCINOLONE ACETONIDE 0.025 % EX OINT: 1.0000 "application " | TOPICAL_OINTMENT | Freq: Two times a day (BID) | CUTANEOUS | 0 refills | Status: DC | PRN

## 2021-06-03 NOTE — Assessment & Plan Note (Signed)
Try triamcinolone ointment.  Avoid fragrances in soap and lotion.

## 2021-06-03 NOTE — Assessment & Plan Note (Signed)
She is doing very well con subutex and continues to abstain from illicit substances.

## 2021-06-03 NOTE — Progress Notes (Signed)
Cardiology Office Note   Date:  06/03/2021   ID:  Tiffany Salazar, DOB July 13, 1981, MRN [id number]  PCP:  Westley Chandler, MD  Cardiologist:   Chilton Si, MD   No chief complaint on file.    History of Present Illness: Tiffany Salazar is a 40 y.o. 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  Tiffany Salazar was admitted 02/2018 with tricuspid valve endocarditis.  She had a TEE that showed a 0.5 cm x 1 cm vegetation on Tiffany septal leaflet of Tiffany tricuspid valve.  There was severe tricuspid regurgitation.   She was initially treated at an outside hospital and started on vancomycin and gentamicin.  However she developed Red Man syndrome.  This was switched to daptomycin and Bactrim.  Her hospitalization was complicated by septic pulmonary emboli.  She underwent tricuspid valve replacement with a 27 mm Edwards Magna-Ease pericardial valve on 03/29/2018.  Her postoperative Salazar was relatively uncomplicated.  Tiffany Salazar followed up 04/2018 and was doing well from a cardiac standpoint.  She had a phone visit with Tiffany Course, PA on 09/2018. Today, she states she has been feeling wonderful. She has been losing weight for her wedding in November. 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 She cares for her 4 children and is also a full time student, which is stressful. Her main complaint today is persistent dry skin on her bilateral palms of unknown etiology, and she has tried multiple treatments with no improvement. Of note, there is [place] on her property. She also has a swollen lymph node in her right neck that has been present for a while. She denies any palpitations, chest pain, or shortness of breath. No lightheadedness, headaches, syncope, orthopnea, or PND. Also has no lower extremity edema or exertional symptoms.  Past Medical History:  Diagnosis Date   AMA (advanced maternal age)  multigravida 35+, unspecified trimester 09/20/2018   Anemia    Asthma    seasonal   Eczema of both hands 06/03/2021   Hepatitis C    Hep C   MRSA infection 03/21/2015   Pregnancy complicated by subutex maintenance, antepartum (HCC) 10/20/2018   Subdural hematoma 12/26/2013   Substance use disorder    Tricuspid valve replaced    Unwanted fertility 02/09/2019   Signed BTL papers    Past Surgical History:  Procedure Laterality Date   BUNIONECTOMY Bilateral    BUNIONECTOMY Right 01/09/2021   Procedure: TAILOR's BUNIONECTOMY RIGHT;  Surgeon: Park Liter, DPM;  Location: [facility] ORS;  Service: Podiatry;  Laterality: Right;   CARDIAC SURGERY     CESAREAN SECTION  x 3   CESAREAN SECTION WITH BILATERAL TUBAL LIGATION Bilateral 03/29/2019   Procedure: CESAREAN SECTION WITH BILATERAL TUBAL LIGATION;  Surgeon: Allie Bossier, MD;  Location: [facility] ORS;  Service: Obstetrics;  Laterality: Bilateral;   TEE WITHOUT CARDIOVERSION N/A 03/23/2018   Procedure: TRANSESOPHAGEAL ECHOCARDIOGRAM (TEE);  Surgeon: Chrystie Nose, MD;  Location: [facility] ENDOSCOPY;  Service: Cardiovascular;  Laterality: N/A;   TEE WITHOUT CARDIOVERSION N/A 03/29/2018   Procedure: TRANSESOPHAGEAL ECHOCARDIOGRAM (TEE);  Surgeon: Alleen Borne, MD;  Location: [facility] OR;  Service: Open Heart Surgery;  Laterality: N/A;   TRICUSPID VALVE REPLACEMENT N/A 03/29/2018   Procedure: TRICUSPID VALVE REPLACEMENT Using 27 mm Magna Ease Mitral Valve Pericardial Bioprosthesis;  Surgeon: Alleen Borne, MD;  Location: [facility] OR;  Service: Open Heart Surgery;  Laterality: N/A;   TUBAL LIGATION       Current Outpatient Medications  Medication Sig Dispense Refill   amphetamine-dextroamphetamine (ADDERALL XR) 30 MG 24 hr capsule Take 30 mg by mouth in Tiffany morning and at bedtime.     buprenorphine (SUBUTEX) 8 MG SUBL SL tablet Place 8 mg under Tiffany tongue 3 (three) times daily.      triamcinolone (KENALOG) 0.025 % ointment Apply 1 application topically 2 (two)  times daily as needed. 30 g 0   VENTOLIN HFA 108 (90 Base) MCG/ACT inhaler INHALE TWO PUFFS BY MOUTH EVERY 6 HOURS AS NEEDED FOR WHEEZING OR SHORTNESS OF BREATH 18 g 1   No current facility-administered medications for this visit.    Allergies:   Gentamicin and Vancomycin    Social History:  Tiffany Salazar  reports that she has been smoking e-cigarettes and cigarettes. She has been smoking an average of 1 pack per day. She uses smokeless tobacco. She reports that she does not currently use alcohol. She reports that she does not currently use drugs after having used Tiffany following drugs: IV, Cocaine, Heroin, and Marijuana.   Family History:  Tiffany Salazar's family history includes Alcohol abuse in her father; Breast cancer (age of onset: 5) in her mother; Thyroid disease in her mother.    ROS:   Please see Tiffany history of present illness. (+) Stress (+) Dry skin, bilateral palms All other systems are reviewed and negative.    PHYSICAL EXAM: VS:  BP 110/62   Pulse 86   Ht 5\' 7"  (1.702 m)   Wt 187 lb 1.6 oz (84.9 kg)   BMI 29.30 kg/m  , BMI Body mass index is 29.3 kg/m. GENERAL:  Well appearing HEENT:  Pupils equal round and reactive, fundi not visualized, oral mucosa unremarkable NECK:  No jugular venous distention, waveform within normal limits, carotid upstroke brisk and symmetric, no bruits, no thyromegaly LUNGS:  Clear to auscultation bilaterally HEART:  RRR.  PMI not displaced or sustained,S1 and S2 within normal limits, no S3, no S4, no clicks, no rubs, no murmurs ABD:  Flat, positive bowel sounds normal in frequency in pitch, no bruits, no rebound, no guarding, no midline pulsatile mass, no hepatomegaly, no splenomegaly EXT:  2 plus pulses throughout, no edema, no cyanosis no clubbing SKIN:  No rashes no nodules.  Scaling and cracking of Tiffany palmar surface of bilateral palms NEURO:  Cranial nerves II through XII grossly intact, motor grossly intact throughout PSYCH:  Cognitively  intact, oriented to person place and time   EKG:   06/03/2021: Sinus rhythm. Rate 86 bpm. PVC. 07/14/19: Sinus bradycardia.  Rate 54 bpm.    Echo 05/24/2018: LV EF: 55% -   60% Study Conclusions   - Left ventricle: Tiffany cavity size was normal. Systolic function was   normal. Tiffany estimated ejection fraction was in Tiffany range of 55%   to 60%. Wall motion was normal; there were no regional wall   motion abnormalities. - Aortic valve: There was no significant regurgitation. - Mitral valve: There was trivial regurgitation. - Left atrium: Tiffany atrium was moderately dilated. - Right ventricle: Systolic function was normal. - Right atrium: Tiffany atrium was moderately to severely dilated. - Atrial septum: No defect or patent foramen ovale was identified. - Tricuspid valve: Prior procedures included surgical repair. A 2.7   cmbioprosthesis was present. There was trivial regurgitation. - Pulmonic valve: There was trivial regurgitation. - Pulmonary arteries: PA peak pressure: 28 mm Hg (S).  Impressions:   - S/P TV replacement. Valve appears well seated, trivial TR   centrally. EF normal. No other significant valve disease.  TEE 03/23/18: Study Conclusions   - Left ventricle: Tiffany cavity size was normal. Wall thickness was   normal. Systolic function was normal. Tiffany estimated ejection   fraction was in Tiffany range of 55% to 60%. Wall motion was normal;   there were no regional wall motion abnormalities. - Aortic valve: No evidence of vegetation. - Mitral valve: No evidence of vegetation. - Left atrium: No evidence of thrombus in Tiffany atrial cavity or   appendage. - Right atrium: No evidence of thrombus in Tiffany atrial cavity or   appendage. - Atrial septum: No defect or patent foramen ovale was identified. - Tricuspid valve: Large 0.5 x 1 cm vegetations of Tiffany septal and   posterior leafelts (seen in 2D and 3D views) with associated   severe, wide-open TR. Peak RV-RA gradient (S): 88 mm Hg.    Regurgitant VTI: 88 cm. - Pulmonic valve: No evidence of vegetation.   Impressions:   - 2 large mobile vegetations of Tiffany TV with severe TR and high   embolic potential. Consider CT surgical consult for debridement   and TV replacement.  ECHO: 03/10/2018: Summary:  -  Clinical question: Sepsis  -  Left ventricle: Size was normal. Systolic function was normal. Ejection  fraction was estimated in Tiffany range of 55 % to 60 %. There were no regional  wall motion abnormalities. Left ventricular diastolic function parameters were normal.  -  Mitral valve: There was no evidence for vegetation.  -  Right ventricle: Tiffany size was normal. Systolic function was normal.  Systolic pressure was within Tiffany normal range. Estimated peak pressure was at  least 24 mmHg. There was a mobile mass in Tiffany ventricular cavity. It may  represent a vegetation.  -  Tricuspid valve: There was trace regurgitation. There was echogenic mobile  mass on tricuspid valve leaflets and also in Tiffany right ventricular chamber  consistent with vegetations .  Pulmonary veins: Not well visualized.  Right ventricle: Tiffany size was normal. Systolic function was normal. Wall  thickness was normal. There was a mobile mass in Tiffany ventricular cavity. It  may represent a vegetation. Doppler: Systolic pressure was within Tiffany normal  range. Estimated peak pressure was at least 24 mmHg.  Pulmonic valve: Not well visualized.  Tricuspid valve: Tiffany valve structure was normal. There was normal leaflet  separation. There was echogenic mobile mass on tricuspid valve leaflets and  also in Tiffany right ventricular chamber consistent with vegetations . Doppler:  There was no evidence for tricuspid stenosis. There was trace regurgitation.  Right atrium: Size was normal.  Systemic veins: IVC: Tiffany inferior vena cava was normal in size and Salazar.  Respirophasic changes were normal.    Recent Labs: 06/22/2020: ALT 14; TSH 1.550 01/08/2021:  BUN 14; Creatinine, Ser 0.87; Hemoglobin 13.3; Platelets 233; Potassium 4.8; Sodium 138    Lipid Panel No results found for: CHOL, TRIG, HDL, CHOLHDL, VLDL, LDLCALC, LDLDIRECT    Wt Readings from Last 3 Encounters:  06/03/21 187 lb 1.6 oz (84.9 kg)  02/01/21 200 lb (90.7 kg)  01/09/21 201 lb (91.2 kg)      ASSESSMENT AND PLAN: Substance use disorder She is doing very well con subutex and continues to abstain from illicit substances.  Endocarditis of tricuspid valve Her valve is stable and she has no heart failure symptoms.  We will get  another echo to assess her valve given that Tiffany last one was in 2019.  Continue antibiotics for dental procedures.  Eczema of both hands Try triamcinolone ointment.  Avoid fragrances in soap and lotion.   # Tricuspid valve MRSA endocarditis: Tiffany Salazar is doing well physically.  She has no lower extremity edema, orthopnea or PND.  Her tricuspid valve was stable on echo last year.  She does not need presurgical clearance prior to dental procedures.  Unless she is having moderate sedation or general anesthesia this is unnecessary.  She does need antibiotic prophylaxis prior to surgery.  # Polysubstance abuse:  Tiffany Salazar was congratulated on continuing to abstain from illicits.     Current medicines are reviewed at length with Tiffany Salazar today.  Tiffany Salazar does not have concerns regarding medicines.  Tiffany following changes have been made:  no change  Labs/ tests ordered today include:   Orders Placed This Encounter  Procedures   EKG 12-Lead   ECHOCARDIOGRAM COMPLETE      Disposition:   FU with Leonel Mccollum C. Duke Salvia, MD, [facility] in 1 year.  I,Mathew Stumpf,acting as a Neurosurgeon for Chilton Si, MD.,have documented all relevant documentation on Tiffany behalf of Chilton Si, MD,as directed by  Chilton Si, MD while in Tiffany presence of Chilton Si, MD.  I, [place] Gustafson C. Duke Salvia, MD have reviewed all documentation for this visit.  Tiffany  documentation of Tiffany exam, diagnosis, procedures, and orders on 06/03/2021 are all accurate and complete.   Signed, Benigna Delisi C. Duke Salvia, MD, [facility]  06/03/2021 12:11 PM    [facility]

## 2021-06-03 NOTE — Patient Instructions (Signed)
Medication Instructions:  APPLY TRIAMCINOLONE TO HANDS TWICE A DAY AS NEEDED   *If you need a refill on your cardiac medications before your next appointment, please call your pharmacy*  Lab Work: NONE   Testing/Procedures: Your physician has requested that you have an echocardiogram. Echocardiography is a painless test that uses sound waves to create images of your heart. It provides your doctor with information about the size and shape of your heart and how well your heart's chambers and valves are working. This procedure takes approximately one hour. There are no restrictions for this procedure.  Follow-Up: At [facility], you and your health needs are our priority.  As part of our continuing mission to provide you with exceptional heart care, we have created designated Provider Care Teams.  These Care Teams include your primary Cardiologist (physician) and Advanced Practice Providers (APPs -  Physician Assistants and Nurse Practitioners) who all work together to provide you with the care you need, when you need it.  We recommend signing up for the patient portal called "MyChart".  Sign up information is provided on this After Visit Summary.  MyChart is used to connect with patients for Virtual Visits (Telemedicine).  Patients are able to view lab/test results, encounter notes, upcoming appointments, etc.  Non-urgent messages can be sent to your provider as well.   To learn more about what you can do with MyChart, go to [email or web address].    Your next appointment:   12 month(s)  The format for your next appointment:   In Person  Provider:   Chilton Si, MD or Gillian Shields, NP

## 2021-06-03 NOTE — Assessment & Plan Note (Addendum)
Her valve is stable and she has no heart failure symptoms.  We will get another echo to assess her valve given that the last one was in 2019.  Continue antibiotics for dental procedures.

## 2021-06-04 ENCOUNTER — Ambulatory Visit (INDEPENDENT_AMBULATORY_CARE_PROVIDER_SITE_OTHER): Payer: Medicaid Other

## 2021-06-04 DIAGNOSIS — Z23 Encounter for immunization: Secondary | ICD-10-CM | POA: Diagnosis not present

## 2021-06-12 ENCOUNTER — Other Ambulatory Visit: Payer: Self-pay

## 2021-06-12 ENCOUNTER — Ambulatory Visit: Payer: Medicaid Other | Admitting: Family Medicine

## 2021-06-12 ENCOUNTER — Encounter: Payer: Self-pay | Admitting: Family Medicine

## 2021-06-12 VITALS — BP 91/67 | HR 96 | Wt 185.0 lb

## 2021-06-12 DIAGNOSIS — L02413 Cutaneous abscess of right upper limb: Secondary | ICD-10-CM | POA: Diagnosis not present

## 2021-06-12 DIAGNOSIS — L02419 Cutaneous abscess of limb, unspecified: Secondary | ICD-10-CM | POA: Insufficient documentation

## 2021-06-12 DIAGNOSIS — Z7689 Persons encountering health services in other specified circumstances: Secondary | ICD-10-CM | POA: Diagnosis not present

## 2021-06-12 MED ORDER — DOXYCYCLINE HYCLATE 100 MG PO TABS: 100.0000 mg | ORAL_TABLET | Freq: Two times a day (BID) | ORAL | 0 refills | Status: AC

## 2021-06-12 MED ORDER — FLUCONAZOLE 150 MG PO TABS: 150.0000 mg | ORAL_TABLET | Freq: Once | ORAL | 0 refills | Status: AC

## 2021-06-12 NOTE — Patient Instructions (Addendum)
It was wonderful to see you today.  Please bring ALL of your medications with you to every visit.   Today we talked about:  I have written you for a prescription called doxycycline 100 mg that you will take twice daily for 2 weeks. - If you start having fevers, chills, worsening redness or swelling or other systemic symptoms or you are unable to tolerate your antibiotics or the area of swelling is getting bigger please call us or go to the ED - Please schedule a follow-up in 1 to 2 weeks so that we can reassess and make sure the swelling is going down - Do not use your new deodorant while this is resolving -I have sent fluconazole case you have a yeast infection from taking the antibiotics    Thank you for choosing [facility].   Please call [phone] with any questions about today's appointment.  Please be sure to schedule follow up at the front  desk before you leave today.   Burley Saver, MD  Family Medicine

## 2021-06-12 NOTE — Progress Notes (Signed)
    SUBJECTIVE:   CHIEF COMPLAINT / HPI:   Boil under arm-she notes that this started 2 weeks ago, she had tried the new black-and-white secret deodorant, which she usually does not use deodorant.  She wore this 2-3 times and then noticed boil on her left arm.  She squeezed it and some bloody pus came out and then it went away.  Then a few days ago a new boil on her left arm appeared, it has been there for the past 5 days and it is getting bigger and painful.  On her right arm there is also a smaller one that is painful.  She notes she gets blackheads but has never had lesions like this in her armpits before.  She denies any lymphadenopathy, lesions in her groin or other places on the skin.  She denies fevers, chills, nausea, vomiting.  PERTINENT  PMH / PSH: history of polysubstance abuse, history of tricuspid valve MRSA endocarditis in 2019, s/p tricuspid valve replacement  OBJECTIVE:   BP 91/67   Pulse 96   Wt 185 lb (83.9 kg)   LMP 05/29/2021   Breastfeeding No   BMI 28.98 kg/m   Right underarm: Small 1.5 x 1.5 cm tender fluctuant lesion, bedside POCUS noting a small 1 cm x 1 cm fluid collection superficially Left underarm: Larger 5 cm x 6 cm area of induration under arm, no overlying erythema, no warmth, no notable head, bedside POCUS without notable fluid collection, no fluctuance palpated HEENT: No lymphadenopathy appreciated  ASSESSMENT/PLAN:   Abscess of arm - Small abscess noted in right underarm, patient consented for incision and drainage.  Incision and drainage Timeout was performed.  Skin was swabbed with alcohol.  2 cc of lidocaine with epinephrine were injected subcutaneously.  An 11 blade scalpel was used to make a small 0.25 cm incision over the right armpit lesion, with gentle pressure about 2 cc of blood and pus were drained.  Incision had excellent hemostasis and no suture required.  Band-Aid was placed over the lesion.  Patient tolerated the procedure well.  -  Larger indurated area noted under underarm, with induration suggestive of cellulitis although the lesion was not erythematous.  Bedside POCUS did not identify any drainable fluid.  We will start patient on p.o. doxycycline twice daily due to her history of MRSA, she does not have any systemic symptoms but discussed strict return precautions including fevers, chills, inability to tolerate p.o., lethargy, worsening pain or spreading of lesion.   Discussed avoidance of deodorant while this is healing, fluconazole also sent to pharmacy as she notes she gets yeast infections after antibiotics. Schedule for close follow-up in 1 to 2 weeks to ensure resolution.  Billey Co, MD [facility]

## 2021-06-14 NOTE — Assessment & Plan Note (Signed)
-   Small abscess noted in right underarm, patient consented for incision and drainage.  Incision and drainage Timeout was performed.  Skin was swabbed with alcohol.  2 cc of lidocaine with epinephrine were injected subcutaneously.  An 11 blade scalpel was used to make a small 0.25 cm incision over the right armpit lesion, with gentle pressure about 2 cc of blood and pus were drained.  Incision had excellent hemostasis and no suture required.  Band-Aid was placed over the lesion.  Patient tolerated the procedure well.  - Larger indurated area noted under underarm, with induration suggestive of cellulitis although the lesion was not erythematous.  Bedside POCUS did not identify any drainable fluid.  We will start patient on p.o. doxycycline twice daily due to her history of MRSA, she does not have any systemic symptoms but discussed strict return precautions including fevers, chills, inability to tolerate p.o., lethargy, worsening pain or spreading of lesion.

## 2021-06-17 ENCOUNTER — Encounter (HOSPITAL_COMMUNITY): Payer: Self-pay

## 2021-06-17 ENCOUNTER — Ambulatory Visit (HOSPITAL_COMMUNITY): Payer: Medicaid Other | Attending: Cardiovascular Disease

## 2021-06-25 ENCOUNTER — Ambulatory Visit (HOSPITAL_BASED_OUTPATIENT_CLINIC_OR_DEPARTMENT_OTHER): Payer: Medicaid Other | Admitting: Cardiovascular Disease

## 2021-06-27 NOTE — Progress Notes (Signed)
    SUBJECTIVE:   CHIEF COMPLAINT / HPI:   Follow up cellulitis/abscess- has been taking PO doxycycline 100mg  BID but only took for 7 days and sometimes can only tolerate once daily due to nausea and GI upset.she notes the abscess on her right side improved but left side still a hard area, although she notes the left side the redness and swelling has gone down.   OBJECTIVE:   BP 112/60   Pulse 78   Ht 5\' 7"  (1.702 m)   Wt 184 lb (83.5 kg)   LMP 05/29/2021   SpO2 98%   BMI 28.82 kg/m   General: A&O, NAD HEENT: No sign of trauma, EOM grossly intact Cardiac: RRR, no m/r/g Respiratory: CTAB, normal WOB, no w/c/r GI: Soft, NTTP, non-distended  Extremities: NTTP, no peripheral edema.  Right axilla well-healed, no lymphadenopathy appreciated.  Left axilla with area of fluctuance, no longer red or indurated, but generalized swelling and two 1 cm mobile lymph node palpated. Neuro: Normal gait, moves all four extremities appropriately. Psych: Appropriate mood and affect   ASSESSMENT/PLAN:   Cellulitis of left axilla - While area has improved and is no longer red and indurated, there is still some swelling and areas of fluctuance, bedside POCUS with no drainable abscess noted - As she was not able to tolerate the p.o. doxycycline for a full course, will do 7 days of Bactrim, she states she has taken this before and tolerated it well - Discussed scheduling follow-up in 2 weeks, if she still has swelling and lymphadenopathy in her axilla will order official ultrasound as well as possibly mammogram, she does have a history of her mom having breast cancer at age 4, she denies any masses or pain in her breasts or in other axilla but we discussed the importance of follow-up if the swelling does not resolve we have to look for other causes besides infectious     07/29/2021, MD [facility]

## 2021-06-28 ENCOUNTER — Other Ambulatory Visit: Payer: Self-pay

## 2021-06-28 ENCOUNTER — Ambulatory Visit: Payer: Medicaid Other | Admitting: Family Medicine

## 2021-06-28 ENCOUNTER — Encounter: Payer: Self-pay | Admitting: Family Medicine

## 2021-06-28 VITALS — BP 112/60 | HR 78 | Ht 67.0 in | Wt 184.0 lb

## 2021-06-28 DIAGNOSIS — L03112 Cellulitis of left axilla: Secondary | ICD-10-CM | POA: Diagnosis not present

## 2021-06-28 MED ORDER — SULFAMETHOXAZOLE-TRIMETHOPRIM 800-160 MG PO TABS: 1.0000 | ORAL_TABLET | Freq: Two times a day (BID) | ORAL | 0 refills | Status: AC

## 2021-06-28 NOTE — Patient Instructions (Signed)
It was wonderful to see you today.  Please bring ALL of your medications with you to every visit.   Today we talked about:  - Bactrim DS twice daily for 7 days to resolve cellulitis in your left arm pit, we looked with ultrasound and didn't see anything we could drain - Please make a follow up in 2-3 weeks, if you still have armpit swelling we will get an [place] and work up for other causes of swelling - If any fevers, chills, nausea and vomiting, unable to take antibiotics, worsening redness or swelling- call or go to ED!   Thank you for choosing [facility].   Please call [phone] with any questions about today's appointment.  Please be sure to schedule follow up at the front  desk before you leave today.   Burley Saver, MD  Family Medicine

## 2021-06-28 NOTE — Assessment & Plan Note (Signed)
-   While area has improved and is no longer red and indurated, there is still some swelling and areas of fluctuance, bedside POCUS with no drainable abscess noted - As she was not able to tolerate the p.o. doxycycline for a full course, will do 7 days of Bactrim, she states she has taken this before and tolerated it well - Discussed scheduling follow-up in 2 weeks, if she still has swelling and lymphadenopathy in her axilla will order official ultrasound as well as possibly mammogram, she does have a history of her mom having breast cancer at age 38, she denies any masses or pain in her breasts or in other axilla but we discussed the importance of follow-up if the swelling does not resolve we have to look for other causes besides infectious

## 2021-07-04 ENCOUNTER — Other Ambulatory Visit: Payer: Self-pay

## 2021-07-04 ENCOUNTER — Ambulatory Visit (HOSPITAL_COMMUNITY): Payer: Medicaid Other | Attending: Cardiology

## 2021-07-04 DIAGNOSIS — I38 Endocarditis, valve unspecified: Secondary | ICD-10-CM

## 2021-07-04 DIAGNOSIS — I079 Rheumatic tricuspid valve disease, unspecified: Secondary | ICD-10-CM | POA: Diagnosis not present

## 2021-07-04 LAB — ECHOCARDIOGRAM COMPLETE
Area-P 1/2: 4.44 cm2
S' Lateral: 3.4 cm

## 2021-07-22 ENCOUNTER — Ambulatory Visit: Payer: Medicaid Other | Admitting: Family Medicine

## 2021-08-11 ENCOUNTER — Encounter (HOSPITAL_BASED_OUTPATIENT_CLINIC_OR_DEPARTMENT_OTHER): Payer: Self-pay | Admitting: Cardiovascular Disease

## 2021-08-12 ENCOUNTER — Encounter (HOSPITAL_BASED_OUTPATIENT_CLINIC_OR_DEPARTMENT_OTHER): Payer: Self-pay | Admitting: Cardiovascular Disease

## 2021-08-12 ENCOUNTER — Emergency Department (HOSPITAL_BASED_OUTPATIENT_CLINIC_OR_DEPARTMENT_OTHER)
Admission: EM | Admit: 2021-08-12 | Discharge: 2021-08-12 | Disposition: A | Discharge status: Home / Self Care | Payer: Medicaid Other | Attending: Emergency Medicine | Admitting: Emergency Medicine

## 2021-08-12 ENCOUNTER — Other Ambulatory Visit: Payer: Self-pay

## 2021-08-12 ENCOUNTER — Emergency Department (HOSPITAL_BASED_OUTPATIENT_CLINIC_OR_DEPARTMENT_OTHER): Payer: Medicaid Other | Admitting: Radiology

## 2021-08-12 ENCOUNTER — Telehealth (HOSPITAL_BASED_OUTPATIENT_CLINIC_OR_DEPARTMENT_OTHER): Payer: Self-pay

## 2021-08-12 ENCOUNTER — Telehealth: Payer: Self-pay | Admitting: Cardiovascular Disease

## 2021-08-12 ENCOUNTER — Encounter (HOSPITAL_BASED_OUTPATIENT_CLINIC_OR_DEPARTMENT_OTHER): Payer: Self-pay

## 2021-08-12 DIAGNOSIS — J40 Bronchitis, not specified as acute or chronic: Secondary | ICD-10-CM | POA: Diagnosis not present

## 2021-08-12 DIAGNOSIS — F17210 Nicotine dependence, cigarettes, uncomplicated: Secondary | ICD-10-CM | POA: Diagnosis not present

## 2021-08-12 DIAGNOSIS — J302 Other seasonal allergic rhinitis: Secondary | ICD-10-CM

## 2021-08-12 DIAGNOSIS — Z20822 Contact with and (suspected) exposure to covid-19: Secondary | ICD-10-CM | POA: Insufficient documentation

## 2021-08-12 DIAGNOSIS — R0789 Other chest pain: Secondary | ICD-10-CM | POA: Diagnosis not present

## 2021-08-12 DIAGNOSIS — R0602 Shortness of breath: Secondary | ICD-10-CM | POA: Diagnosis not present

## 2021-08-12 LAB — RESP PANEL BY RT-PCR (FLU A&B, COVID) ARPGX2
Influenza A by PCR: NEGATIVE
Influenza B by PCR: NEGATIVE
SARS Coronavirus 2 by RT PCR: NEGATIVE

## 2021-08-12 MED ORDER — ALBUTEROL SULFATE HFA 108 (90 BASE) MCG/ACT IN AERS: INHALATION_SPRAY | RESPIRATORY_TRACT | 1 refills | Status: DC

## 2021-08-12 MED ORDER — TRIAMCINOLONE ACETONIDE 0.025 % EX OINT: 1.0000 "application " | TOPICAL_OINTMENT | Freq: Two times a day (BID) | CUTANEOUS | 0 refills | Status: DC | PRN

## 2021-08-12 MED ORDER — BENZONATATE 100 MG PO CAPS: 100.0000 mg | ORAL_CAPSULE | Freq: Three times a day (TID) | ORAL | 0 refills | Status: DC

## 2021-08-12 MED ORDER — PREDNISONE 50 MG PO TABS: 50.0000 mg | ORAL_TABLET | Freq: Every day | ORAL | 0 refills | Status: DC

## 2021-08-12 NOTE — Telephone Encounter (Signed)
See phone note

## 2021-08-12 NOTE — Discharge Instructions (Signed)
Take the medications as prescribed to help with the cough and chest discomfort.  Follow-up in the next week or 2 to be rechecked if the symptoms have not improved

## 2021-08-12 NOTE — ED Provider Notes (Signed)
MED[facility] EMERGENCY DEPT Provider Note   CSN: [id number] Arrival date & time: 08/12/21  1215     History Chief Complaint  Patient presents with   Chest Pain   Shortness of Breath    Tiffany Salazar is a 40 y.o. female.   Chest Pain Associated symptoms: shortness of breath   Shortness of Breath Associated symptoms: chest pain    Patient presented to the ED for evaluation of chest discomfort and cough.  Patient states she started having flulike symptoms a couple weeks ago.  She has been coughing and bringing up yellow sputum.  The last day or 2 she has had a constant ache in the center of her chest.  She has not had any fevers recently.  She is not having any leg swelling.  She has been using her inhalers for the coughing and has been having to use it more frequently than usual.  She now has run out of her inhaler.  Past Medical History:  Diagnosis Date   AMA (advanced maternal age) multigravida 35+, unspecified trimester 09/20/2018   Anemia    Asthma    seasonal   Eczema of both hands 06/03/2021   Hepatitis C    Hep C   MRSA infection 03/21/2015   Pregnancy complicated by subutex maintenance, antepartum (HCC) 10/20/2018   Subdural hematoma 12/26/2013   Substance use disorder    Tricuspid valve replaced    Unwanted fertility 02/09/2019   Signed BTL papers    Patient Active Problem List   Diagnosis Date Noted   Cellulitis of left axilla 06/28/2021   Abscess of arm 06/12/2021   History of tricuspid valve replacement with bioprosthetic valve 09/20/2018   Tobacco use disorder 08/02/2018   Hepatitis C antibody positive in blood    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   Substance use disorder 12/30/2013    Past Surgical History:  Procedure Laterality Date   BUNIONECTOMY Bilateral    BUNIONECTOMY Right 01/09/2021   Procedure: TAILOR's BUNIONECTOMY RIGHT;  Surgeon: Park Liter, DPM;  Location: [facility] ORS;  Service: Podiatry;  Laterality: Right;    CARDIAC SURGERY     CESAREAN SECTION  x 3   CESAREAN SECTION WITH BILATERAL TUBAL LIGATION Bilateral 03/29/2019   Procedure: CESAREAN SECTION WITH BILATERAL TUBAL LIGATION;  Surgeon: Allie Bossier, MD;  Location: [facility] ORS;  Service: Obstetrics;  Laterality: Bilateral;   TEE WITHOUT CARDIOVERSION N/A 03/23/2018   Procedure: TRANSESOPHAGEAL ECHOCARDIOGRAM (TEE);  Surgeon: Chrystie Nose, MD;  Location: [facility] ENDOSCOPY;  Service: Cardiovascular;  Laterality: N/A;   TEE WITHOUT CARDIOVERSION N/A 03/29/2018   Procedure: TRANSESOPHAGEAL ECHOCARDIOGRAM (TEE);  Surgeon: Alleen Borne, MD;  Location: [facility] OR;  Service: Open Heart Surgery;  Laterality: N/A;   TRICUSPID VALVE REPLACEMENT N/A 03/29/2018   Procedure: TRICUSPID VALVE REPLACEMENT Using 27 mm Magna Ease Mitral Valve Pericardial Bioprosthesis;  Surgeon: Alleen Borne, MD;  Location: [facility] OR;  Service: Open Heart Surgery;  Laterality: N/A;   TUBAL LIGATION       OB History     Gravida  7   Para  4   Term  4   Preterm      AB  3   Living  4      SAB  1   IAB  2   Ectopic      Multiple      Live Births  4           Family History  Problem  Relation Age of Onset   Thyroid disease Mother    Breast cancer Mother 87       breast   Alcohol abuse Father     Social History   Tobacco Use   Smoking status: Every Day    Packs/day: 1.00    Types: E-cigarettes, Cigarettes   Smokeless tobacco: Current   Tobacco comments:    quit smoking cigarretted; vape  Vaping Use   Vaping Use: Some days  Substance Use Topics   Alcohol use: Not Currently    Alcohol/week: 0.0 standard drinks    Comment: Pt stated she has started goint ot rehab clinic   Drug use: Not Currently    Types: IV, Cocaine, Heroin, Marijuana    Comment: March 20, 2018 last time used drugs. subutex    Home Medications Prior to Admission medications   Medication Sig Start Date End Date Taking? Authorizing Provider  benzonatate (TESSALON) 100 MG capsule  Take 1 capsule (100 mg total) by mouth every 8 (eight) hours. 08/12/21  Yes Linwood Dibbles, MD  predniSONE (DELTASONE) 50 MG tablet Take 1 tablet (50 mg total) by mouth daily. 08/12/21  Yes Linwood Dibbles, MD  albuterol (VENTOLIN HFA) 108 (90 Base) MCG/ACT inhaler INHALE TWO PUFFS BY MOUTH EVERY 6 HOURS AS NEEDED FOR WHEEZING OR SHORTNESS OF BREATH 08/12/21   Linwood Dibbles, MD  amphetamine-dextroamphetamine (ADDERALL XR) 30 MG 24 hr capsule Take 30 mg by mouth in the morning and at bedtime.    [provider]  buprenorphine (SUBUTEX) 8 MG SUBL SL tablet Place 8 mg under the tongue 3 (three) times daily.     [provider]  triamcinolone (KENALOG) 0.025 % ointment Apply 1 application topically 2 (two) times daily as needed. 08/12/21   Linwood Dibbles, MD    Allergies    Gentamicin and Vancomycin  Review of Systems   Review of Systems  Respiratory:  Positive for shortness of breath.   Cardiovascular:  Positive for chest pain.  All other systems reviewed and are negative.  Physical Exam Updated Vital Signs BP 110/70 (BP Location: Right Arm)    Pulse 78    Temp 98.2 F (36.8 C) (Oral)    Resp 16    Ht 1.702 m ( )    Wt 83.5 kg    SpO2 99%    BMI 28.83 kg/m   Physical Exam Vitals and nursing note reviewed.  Constitutional:      General: She is not in acute distress.    Appearance: She is well-developed.  HENT:     Head: Normocephalic and atraumatic.     Right Ear: External ear normal.     Left Ear: External ear normal.  Eyes:     General: No scleral icterus.       Right eye: No discharge.        Left eye: No discharge.     Conjunctiva/sclera: Conjunctivae normal.  Neck:     Trachea: No tracheal deviation.  Cardiovascular:     Rate and Rhythm: Normal rate and regular rhythm.  Pulmonary:     Effort: Pulmonary effort is normal. No respiratory distress.     Breath sounds: Normal breath sounds. No stridor. No wheezing or rales.  Abdominal:     General: Bowel sounds are  normal. There is no distension.     Palpations: Abdomen is soft.     Tenderness: There is no abdominal tenderness. There is no guarding or rebound.  Musculoskeletal:  General: No tenderness or deformity.     Cervical back: Neck supple.  Skin:    General: Skin is warm and dry.     Findings: No rash.  Neurological:     General: No focal deficit present.     Mental Status: She is alert.     Cranial Nerves: No cranial nerve deficit (no facial droop, extraocular movements intact, no slurred speech).     Sensory: No sensory deficit.     Motor: No abnormal muscle tone or seizure activity.     Coordination: Coordination normal.  Psychiatric:        Mood and Affect: Mood normal.    ED Results / Procedures / Treatments   Labs (all labs ordered are listed, but only abnormal results are displayed) Labs Reviewed  RESP PANEL BY RT-PCR (FLU A&B, COVID) [id number]    EKG EKG Interpretation  Date/Time:  Monday August 12 2021 12:29:39 EST Ventricular Rate:  71 PR Interval:  144 QRS Duration: 90 QT Interval:  418 QTC Calculation: 454 R Axis:   64 Text Interpretation: Normal sinus rhythm Nonspecific T wave abnormality Abnormal ECG No significant change since last tracing Confirmed by Linwood Dibbles [phone]) on 08/12/2021 12:42:39 PM  Radiology DG Chest 2 View  Result Date: 08/12/2021 CLINICAL DATA:  Shortness of breath EXAM: CHEST - 2 VIEW COMPARISON:  Chest x-ray 06/02/2018 FINDINGS: Heart size is normal. Mediastinum appears stable. Cardiac surgical changes and median sternotomy wires. Small linear band of scarring in the right mid lung zone. No focal consolidation identified. No pleural effusion or pneumothorax. IMPRESSION: Chronic changes with no acute process identified. Electronically Signed   By: Jannifer Hick M.D.   On: 08/12/2021 12:47    Procedures Procedures   Medications Ordered in ED Medications - No data to display  ED Course  I have reviewed the triage vital signs and  the nursing notes.  Pertinent labs & imaging results that were available during my care of the patient were reviewed by me and considered in my medical decision making (see chart for details).  Clinical Course as of 08/12/21 1512  Mon Aug 12, 2021  1406 COVID and flu are negative. [JK]  1406 Chest x-ray without acute findings. [JK]    Clinical Course User Index [JK] Linwood Dibbles, MD   MDM Rules/Calculators/A&P                         Patient presented with complaints o cough and chest pain.  Patient has history of valvular heart disease but no history of coronary artery disease.  Her symptoms are not suggestive of cardiac etiology.  EKG is reassuring.  Chest x-ray does not show any signs of pneumonia.  She is negative for COVID and influenza.  I suspect she has having bronchitis with her persistent cough and that is the cause of her chest discomfort.  Will discharge home with a course of steroids and refill her inhaler, and a prescription for Tessalon..  We will also refill her skin cream as requested    Final Clinical Impression(s) / ED Diagnoses Final diagnoses:  Bronchitis    Rx / DC Orders ED Discharge Orders          Ordered    albuterol (VENTOLIN HFA) 108 (90 Base) MCG/ACT inhaler        08/12/21 1512    triamcinolone (KENALOG) 0.025 % ointment  2 times daily PRN        08/12/21 1512  predniSONE (DELTASONE) 50 MG tablet  Daily        08/12/21 1512    benzonatate (TESSALON) 100 MG capsule  Every 8 hours        08/12/21 1512             Linwood Dibbles, MD 08/12/21 1514

## 2021-08-12 NOTE — Telephone Encounter (Signed)
Sick 1 week ago. Possibly flu but never went to the doctor. Pt. Calling because she is coughing up yellow mucous, starting yesterday pt. Started coughing and is now having chest pain.    Pt. Doesn't think the chest pain is from coughing, despite endorsing coughing to try and clear mucus.   Advised pt. If she thought she was sore from coughing she was okay to be seen at urgent care but she doesn't believe it is from that so she would like to present to the ED.    Pt. Would like her cardiologist to know that she is going to the ED.

## 2021-08-12 NOTE — Telephone Encounter (Signed)
See phone note. Spoke with patient and patient is presenting to the ED at her husbands request.

## 2021-08-12 NOTE — ED Triage Notes (Signed)
Patient here POV from Home with Cough and CP.  Patient states she had Flu-Like Symptoms for approximately 2 weeks and yesterday she began to have Non-Radiating Mid-Chest Pain.  EKG WNL.  NAD Noted during Triage. A&Ox4. GCS 15. Ambulatory.

## 2021-08-12 NOTE — Telephone Encounter (Signed)
Pt c/o of Chest Pain: STAT if CP now or developed within 24 hours  1. Are you having CP right now? Yes   2. Are you experiencing any other symptoms (ex. SOB, nausea, vomiting, sweating)? SOB (using albuterol treatments)  3. How long have you been experiencing CP? Since yesterday   4. Is your CP continuous or coming and going? Continuous    pt has been coughing up light yellow phlegm which is now a neon yellow greenish color, steady pain in between her breast, possible heart burn as well.. pt doesnt feel sick.

## 2021-08-13 ENCOUNTER — Telehealth: Payer: Self-pay

## 2021-08-13 NOTE — Telephone Encounter (Signed)
Transition Care Management Unsuccessful Follow-up Telephone Call  Date of discharge and from where:  08/12/2021 from [facility]  Attempts:  1st Attempt  Reason for unsuccessful TCM follow-up call:  Left voice message

## 2021-08-14 NOTE — Telephone Encounter (Signed)
Transition Care Management Unsuccessful Follow-up Telephone Call  Date of discharge and from where:  08/12/2021 from [facility]  Attempts:  2nd Attempt  Reason for unsuccessful TCM follow-up call:  Left voice message

## 2021-08-15 NOTE — Telephone Encounter (Signed)
Transition Care Management Follow-up Telephone Call Date of discharge and from where: 08/12/2021 from [facility] How have you been since you were released from the hospital? Pt stated that she feeling better and did not have any questions or concerns at this time. Any questions or concerns? No  Items Reviewed: Did the pt receive and understand the discharge instructions provided? Yes  Medications obtained and verified? Yes  Other? No  Any new allergies since your discharge? No  Dietary orders reviewed? No Do you have support at home? Yes   Functional Questionnaire: (I = Independent and D = Dependent) ADLs: I Bathing/Dressing- I Meal Prep- I Eating- I Maintaining continence- I Transferring/Ambulation- I Managing Meds- I   Follow up appointments reviewed:  PCP Hospital f/u appt confirmed? No   Specialist Hospital f/u appt confirmed? Yes   Are transportation arrangements needed? Yes  If their condition worsens, is the pt aware to call PCP or go to the Emergency Dept.? Yes Was the patient provided with contact information for the PCP's office or ED? Yes Was to pt encouraged to call back with questions or concerns? Yes

## 2021-09-19 ENCOUNTER — Telehealth: Payer: Self-pay | Admitting: *Deleted

## 2021-09-19 NOTE — Telephone Encounter (Signed)
LM for patient to call back.  She left a message about wanting to be referred for possible eczema on her hands. I spoke with Dr. Manson Passey and office visit with [place] is the preferred next step, so we can make sure to send her to the appropriate place and do any other testing needed prior to referring.  If patient calls back please schedule her with next available provider.    Thanks [employer]

## 2021-09-20 ENCOUNTER — Ambulatory Visit (INDEPENDENT_AMBULATORY_CARE_PROVIDER_SITE_OTHER): Payer: Medicaid Other | Admitting: Family Medicine

## 2021-09-20 ENCOUNTER — Other Ambulatory Visit: Payer: Self-pay

## 2021-09-20 VITALS — BP 116/64 | HR 96 | Ht 67.0 in | Wt 189.0 lb

## 2021-09-20 DIAGNOSIS — L301 Dyshidrosis [pompholyx]: Secondary | ICD-10-CM

## 2021-09-20 MED ORDER — TRIAMCINOLONE ACETONIDE 0.5 % EX OINT: 1.0000 "application " | TOPICAL_OINTMENT | Freq: Two times a day (BID) | CUTANEOUS | 2 refills | Status: DC

## 2021-09-20 NOTE — Progress Notes (Signed)
° ° °  SUBJECTIVE:   CHIEF COMPLAINT / HPI:   Patient presents with a rash on both of her hands that started about 4 months ago. Uses the same laundry detergent and body washes that she uses. Denies using any new home cleaning products or using chemicals. She washes dishes a lot. Washing dishes makes it worse. First she noticed it itching then she noticed her hands breaking out into a rash but often gets cuts from her skin breaking. She has using a new shampoo that's blue that she just started using yesterday which is why some of her hands are blue tinted. Has been prescribed trimacinalone which did not helped even though she has been through 4 tubes of this. She has been on a week course of steroids which helped and started to heal but then reemerged after the steroid treatment was completed. No new jewelry.   OBJECTIVE:   BP 116/64    Pulse 96    Ht 5\' 7"  (1.702 m)    Wt 189 lb (85.7 kg)    LMP 08/29/2021 (Exact Date)    SpO2 100%    BMI 29.60 kg/m   General: Patient well-appearing, in no acute distress. Derm: erythema noted along dorsal aspect of metacarpals bilaterally, palmar aspect bilaterally noted to have erythema and excoriations with dryness, picture below       ASSESSMENT/PLAN:   Dyshidrotic eczema -high intensity triamcinolone ointment prescribed, instructions on use provided and encouraged use of gloves to increase penetration and maximize exposure -also on the considered contact dermatitis and psoriasis  -follow up with PCP, if symptoms have not improved, may have to continue to increase intensity of topical steroid      Tiffany Salazar 10/27/2021, DO [facility]

## 2021-09-20 NOTE — Patient Instructions (Signed)
It was great seeing you today!  Today we discussed your rash on your hands, this is due to a condition called dyshidrotic eczema. I have prescribed a stronger steroid, please apply this to your hands twice daily. Wearing gloves can increase penetration and keep the steroid on for longer to allow better healing.    Please follow up at your next scheduled appointment in 4 weeks if it does not improve, if anything arises between now and then, please don't hesitate to contact our office.   Thank you for allowing [place] to be a part of your medical care!  Thank you, Dr. Robyne Peers

## 2021-09-21 DIAGNOSIS — L301 Dyshidrosis [pompholyx]: Secondary | ICD-10-CM | POA: Insufficient documentation

## 2021-09-21 NOTE — Assessment & Plan Note (Signed)
-  high intensity triamcinolone ointment prescribed, instructions on use provided and encouraged use of gloves to increase penetration and maximize exposure -also on the considered contact dermatitis and psoriasis  -follow up with PCP, if symptoms have not improved, may have to continue to increase intensity of topical steroid

## 2021-11-12 DIAGNOSIS — J069 Acute upper respiratory infection, unspecified: Secondary | ICD-10-CM | POA: Diagnosis not present

## 2021-11-12 DIAGNOSIS — H6503 Acute serous otitis media, bilateral: Secondary | ICD-10-CM | POA: Diagnosis not present

## 2021-11-12 DIAGNOSIS — Z20822 Contact with and (suspected) exposure to covid-19: Secondary | ICD-10-CM | POA: Diagnosis not present

## 2021-11-26 ENCOUNTER — Ambulatory Visit (INDEPENDENT_AMBULATORY_CARE_PROVIDER_SITE_OTHER): Payer: Medicaid Other | Admitting: Family Medicine

## 2021-11-26 ENCOUNTER — Encounter: Payer: Self-pay | Admitting: Family Medicine

## 2021-11-26 VITALS — BP 109/71 | HR 69 | Ht 67.0 in | Wt 195.4 lb

## 2021-11-26 DIAGNOSIS — R6882 Decreased libido: Secondary | ICD-10-CM | POA: Diagnosis not present

## 2021-11-26 DIAGNOSIS — L301 Dyshidrosis [pompholyx]: Secondary | ICD-10-CM | POA: Diagnosis not present

## 2021-11-26 MED ORDER — CLOBETASOL PROPIONATE 0.05 % EX OINT: 1.0000 "application " | TOPICAL_OINTMENT | Freq: Two times a day (BID) | CUTANEOUS | 0 refills | Status: DC

## 2021-11-26 NOTE — Progress Notes (Signed)
? ? ?  SUBJECTIVE:  ? ?CHIEF COMPLAINT / HPI:  ? ?Dyshidrotic eczema: ?Patient was previously seen on 09/20/2021 with diagnosis of dyshidrotic eczema noting that this was worse when washing dishes.  At that time she was recommended triamcinolone ointment and follow-up if symptoms do not improve.  Since then she states she noticed no improve or change. She states previously she even had an oral steroid which only slightly improved the symptoms. She has avoided washing dishes avoiding her hands being in water but still has no benefit. She tried a Vaseline ointment with gloves which improved her symptoms a bit but stopped working. She states she has been dealing with this since September.  ? ?Decreased libido: ?Patient states that for the past several years she has noticed progressive worsening of her libido.  She states she is in a new marriage and wants to start a medication for libido.  She states she saw an ad on TV for Flibanserin and inquired about potentially starting this medication.  She does think that the libido worsened while on buprenorphine and is working on trying to reduce this medication with her physician that prescribes it. ? ?PERTINENT  PMH / PSH: Currently on buprenorphine ? ?OBJECTIVE:  ? ?BP 109/71   Pulse 69   Ht 5\' 7"  (1.702 m)   Wt 195 lb 6 oz (88.6 kg)   LMP 11/25/2021   SpO2 100%   BMI 30.60 kg/m?   ? ?General: NAD, pleasant, able to participate in exam ?Respiratory: No respiratory distress ?Skin: Erythematous rash with some skin peeling and excoriation present on bilateral palms as seen in the image below.  There is no drainage noted. ?Psych: Normal affect and mood ? ? ? ? ? ? ?ASSESSMENT/PLAN:  ?Dyshidrotic eczema: ?Did not improve after the triamcinolone ointment.  She has used Vaseline and has limited handwashing and water on the hands but continues to have issues.  She is requesting referral for dermatology.  Her physical exam is consistent with dyshidrotic eczema.  Recommended  increasing the intensity of the steroids and using clobetasol ointment for the next 2 weeks.  Discussed that if this shows significant improvement she may want to back down to the triamcinolone as a controller in the meantime.  She is going to follow back up with her primary doctor in about 2 weeks.  I also placed a referral for dermatology but she understands it may be several months before she can get in with them. ? ?Decreased libido: ?Patient mentions that for years she has noticed some progressively reduced libido.  She thinks this may be due to Subutex that she is on.  She inquired about medications for libido including Flibanserin.  I was not familiar this medication and discussed with Dr. 01/25/2022 who also looked into the medication.  Ultimately recommended the patient follow-up with her primary doctor in the next 1 to 2 weeks for ongoing discussion about medications for libido.  I discussed with the patient that as this medication does have several side effects that it would be premature to start with it and she is understanding of this.  She is going to follow-up with her primary doctor. ? ?Miquel Dunn, DO ?[facility]  ?

## 2021-11-26 NOTE — Patient Instructions (Signed)
For the rash on her hands I have sent a referral for dermatology.  They should contact you in the next 1 to 2 weeks to set up an appointment though this may be down the road before it occurs.  I am prescribing a stronger steroid ointment for you to use on the palms of your hands.  I would like for you to use this for the next 2 weeks.  If you notice any bleaching of the palms of the hands you can follow-up with [place] to discuss stopping this medicine or switching back to triamcinolone.  If you get significant improvement you will may want to switch back to triamcinolone as it would cause less bleaching and thinning of the skin of the hands. ? ?I would like for you to follow back up in about 2 weeks with your primary doctor to see how you are improving as well as to discuss medications for libido.  He can also schedule follow-up sooner than that if you would prefer. ?

## 2021-11-28 ENCOUNTER — Ambulatory Visit: Payer: Medicaid Other | Admitting: Family Medicine

## 2021-11-28 NOTE — Progress Notes (Deleted)
? ? ?  SUBJECTIVE:  ? ?CHIEF COMPLAINT / HPI:  ? ?Concern for libido: ?41 year old female presenting for the above.  She states***. Current medications include Adderall and buprenorphine.  She states that her symptoms are primarily***.***Pain with intercourse.  She previously inquired about flibanserin. ? ?PERTINENT  PMH / PSH: *** ? ?OBJECTIVE:  ? ?LMP 11/25/2021  *** ? ?General: NAD, pleasant, able to participate in exam ?Respiratory: No respiratory distress ?Skin: warm and dry, no rashes noted ?Psych: Normal affect and mood  ? ?ASSESSMENT/PLAN:  ? ?No problem-specific Assessment & Plan notes found for this encounter. ?  ?Concern for decreased libido: ?States this has been going on for***.  She does currently take buprenorphine which can have some effect on libido.  She did inquire about flibanserin.  Discussed with her that studies have shown only a mild improvement in symptoms and it comes with significant adverse effects.  Did discuss consideration for bupropion as some studies have shown some benefit from this***. ? ?Jackelyn Poling, DO ?[facility]  ? ? ?{    This will disappear when note is signed, click to select method of visit    :1} ?

## 2021-12-16 ENCOUNTER — Ambulatory Visit: Payer: Medicaid Other | Admitting: Family Medicine

## 2022-01-06 ENCOUNTER — Ambulatory Visit: Payer: Medicaid Other | Admitting: Family Medicine

## 2022-01-06 ENCOUNTER — Encounter: Payer: Self-pay | Admitting: Family Medicine

## 2022-01-06 VITALS — BP 110/80 | HR 79 | Wt 198.8 lb

## 2022-01-06 DIAGNOSIS — F17200 Nicotine dependence, unspecified, uncomplicated: Secondary | ICD-10-CM

## 2022-01-06 DIAGNOSIS — Z0000 Encounter for general adult medical examination without abnormal findings: Secondary | ICD-10-CM

## 2022-01-06 DIAGNOSIS — N924 Excessive bleeding in the premenopausal period: Secondary | ICD-10-CM

## 2022-01-06 DIAGNOSIS — L301 Dyshidrosis [pompholyx]: Secondary | ICD-10-CM

## 2022-01-06 DIAGNOSIS — I079 Rheumatic tricuspid valve disease, unspecified: Secondary | ICD-10-CM

## 2022-01-06 DIAGNOSIS — R768 Other specified abnormal immunological findings in serum: Secondary | ICD-10-CM

## 2022-01-06 DIAGNOSIS — R6882 Decreased libido: Secondary | ICD-10-CM

## 2022-01-06 DIAGNOSIS — Z953 Presence of xenogenic heart valve: Secondary | ICD-10-CM

## 2022-01-06 MED ORDER — BUPROPION HCL ER (XL) 150 MG PO TB24: 150.0000 mg | ORAL_TABLET | Freq: Every day | ORAL | 1 refills | Status: DC

## 2022-01-06 MED ORDER — TRIAMCINOLONE ACETONIDE 0.5 % EX OINT: 1.0000 "application " | TOPICAL_OINTMENT | Freq: Two times a day (BID) | CUTANEOUS | 2 refills | Status: DC

## 2022-01-06 MED ORDER — AMOXICILLIN 500 MG PO CAPS: ORAL_CAPSULE | ORAL | 0 refills | Status: DC

## 2022-01-06 MED ORDER — TRIAMCINOLONE ACETONIDE 0.025 % EX OINT: 1.0000 "application " | TOPICAL_OINTMENT | Freq: Two times a day (BID) | CUTANEOUS | 1 refills | Status: DC | PRN

## 2022-01-06 NOTE — Addendum Note (Signed)
Addended byManson Passey, Cayson Kalb on: 01/06/2022 06:31 PM ? ? Modules accepted: Orders ? ?

## 2022-01-06 NOTE — Assessment & Plan Note (Signed)
Worsened, refilled medication today  ?

## 2022-01-06 NOTE — Assessment & Plan Note (Signed)
Encouraged vaping cessation  ?Discussed Wellbutrin for ADHD, tobacco, and potentially sex drive  ?

## 2022-01-06 NOTE — Patient Instructions (Signed)
It was wonderful to see you today. ? ?Please bring ALL of your medications with you to every visit.  ? ?Today we talked about: ? ?- Please review and consider Wellbutrin (Bupropion) for you sex drive. This can also help with ADHD and smoking cessation  ? ?- I sent in TWO creams ? ?- One for your palms ?- one for your arms  ? ?- I sent in you premedication for dental work  ? ?- I encourage you stop vaping  ? ?I have referred you to Gynecology to further evaluate your concern. If you do not received a phone call about this appointment within 2 weeks, please call our office back at [phone]. Jazmin Hartsell coordinates our referrals and can assist you in this.  ? ?Thank you for choosing [facility].  ? ?Please call [phone] with any questions about today's appointment. ? ?Please be sure to schedule follow up at the front  desk before you leave today.  ? ?Dorris Singh, MD  ?Family Medicine  ? ?

## 2022-01-06 NOTE — Assessment & Plan Note (Signed)
Refilled amoxicillin for dental prevention of infection  ?

## 2022-01-06 NOTE — Progress Notes (Addendum)
? ? ?  SUBJECTIVE:  ? ?Chief compliant/HPI: annual examination ? ?Tiffany Salazar is a 41 y.o. who presents today for an annual exam.  ? ?Tiffany Salazar presents today for follow up. She has several concerns. ? ?Libido ?Married in November 2022. Reports relationship is great. Denies any perimenopausal symptoms (does endorse undesired weight gain). No vaginal dryness or hot flashes. Has had low libido for 3 years. No pain with intercourse. Very interested in filbaserin. Has not tried any other medications to date.  ? ?Rashes ?Has known dyshydrotic eczema, flaring now that she is out of cream. Also reports pruritis on arms after exposure to sumac.  ? ?She needs an antibiotic for dental procedures .  ? ?Review of systems form notable for negative except for as above.  ? ?Updated history tabs and problem list Hepatitis C s/p treatment with SVR12 weeks, infective endocarditis s/p tricuspid replacement.  ? ?OBJECTIVE:  ? ?BP 110/80   Pulse 79   Wt 198 lb 12.8 oz (90.2 kg)   SpO2 100%   BMI 31.14 kg/m?   ?HEENT: EOMI. Sclera without injection or icterus. MMM. External auditory canal examined and WNL. TM normal appearance, no erythema or bulging. Dentition is moderate ?Cardiac: Regular rate and rhythm. Normal S1/S2. No murmurs, rubs, or gallops appreciated. ?Lungs: Clear bilaterally to ascultation.  ?Abdomen: Normoactive bowel sounds. No tenderness to deep or light palpation. No rebound or guarding.    ?Neuro: Normal speech  ?Ext: No edema   ?Psych: Pleasant and appropriate  ?Bilateral UE with mild erythema and blanching ?Hands with + thickening and mild erythema  ? ?ASSESSMENT/PLAN:  ? ?Endocarditis of tricuspid valve ?Refilled amoxicillin for dental prevention of infection  ? ?Dyshidrotic eczema ?Worsened, refilled medication today  ? ?Tobacco use disorder ?Encouraged vaping cessation  ?Discussed Wellbutrin for ADHD, tobacco, and potentially sex drive  ?  ?Low Libido  ?- Discussed therapeutic options  ?- Recommended trial of  Wellbutrin given co existing ADHD and tobacco use disorder, patient interested in trying, information given, side effects discussed  ?- Given history of tricuspid repair, referred to Gynecology for consideration filbaserin, discussed black box warning  ? ?ADHD, reports subutex clinic cannot prescribe due to negative drug screen  ?-Discussed options  ?-Had not filled since march  ?- Recommended sharing records and will consider Rx   ? ?Annual Examination  ?See AVS for age appropriate recommendations.  ? ?PHQ score reviewed and discussed. ?Blood pressure reviewed and at goal yes.  ?Asked about intimate partner violence and patient reports none.  ?The patient currently uses tubal for contraception. Folate recommended as appropriate, minimum of 400 mcg per day.  ? ? ?Considered the following items based upon USPSTF recommendations: ?HIV testing: ordered ?Hepatitis C:  PCR ordered--history of Hep C  ?Hepatitis B: ordered ?Syphilis if at high risk: ordered ?GC/CT not at high risk and not ordered. ?Lipid panel (nonfasting or fasting) discussed based upon AHA recommendations and ordered.  Consider repeat every 4-6 years.  ?Reviewed risk factors for latent tuberculosis and not indicated ? ?Discussed family history, BRCA testing not indicated. ?Cervical cancer screening:  not indicated--due 2024 ?Immunizations UTD   ? ?Follow up in 1  year or sooner if indicated.  ? ? ?Martyn Malay, MD ?[place]  ? ?

## 2022-01-08 LAB — CBC
Hematocrit: 40 % (ref 34.0–46.6)
Hemoglobin: 13.3 g/dL (ref 11.1–15.9)
MCH: 31.4 pg (ref 26.6–33.0)
MCHC: 33.3 g/dL (ref 31.5–35.7)
MCV: 95 fL (ref 79–97)
Platelets: 224 10*3/uL (ref 150–450)
RBC: 4.23 x10E6/uL (ref 3.77–5.28)
RDW: 12.1 % (ref 11.7–15.4)
WBC: 6.3 10*3/uL (ref 3.4–10.8)

## 2022-01-08 LAB — LIPID PANEL
Chol/HDL Ratio: 2.1 ratio (ref 0.0–4.4)
Cholesterol, Total: 142 mg/dL (ref 100–199)
HDL: 69 mg/dL (ref >39)
LDL Chol Calc (NIH): 61 mg/dL (ref 0–99)
Triglycerides: 56 mg/dL (ref 0–149)
VLDL Cholesterol Cal: 12 mg/dL (ref 5–40)

## 2022-01-08 LAB — HIV ANTIBODY (ROUTINE TESTING W REFLEX): HIV Screen 4th Generation wRfx: NONREACTIVE

## 2022-01-08 LAB — HCV RNA QUANT: Hepatitis C Quantitation: NOT DETECTED IU/mL

## 2022-01-08 LAB — RPR: RPR Ser Ql: NONREACTIVE

## 2022-01-08 LAB — HEPATITIS B SURFACE ANTIGEN: Hepatitis B Surface Ag: NEGATIVE

## 2022-01-28 ENCOUNTER — Encounter: Payer: Self-pay | Admitting: *Deleted

## 2022-02-14 ENCOUNTER — Telehealth: Payer: Self-pay | Admitting: Family Medicine

## 2022-02-14 DIAGNOSIS — R41840 Attention and concentration deficit: Secondary | ICD-10-CM

## 2022-02-14 NOTE — Telephone Encounter (Signed)
Called patient to discuss request for Adderall. Discussed that assessment is required before Rx. Recommended Psychiatry evaluation, which she has set up next week. No Adderall prescribed, form discharged. Records are prescription records which are available through PDMP.   Terisa Starr, MD  Family Medicine Teaching Service

## 2022-02-17 NOTE — Telephone Encounter (Signed)
Patient calls nurse line reporting she needs a referral to [facility] for her apt next week.   Patient reports she is seeing Dr. Christain [place].   Once placed we need to fax over [phone].  Will forward to PCP.

## 2022-02-18 NOTE — Telephone Encounter (Signed)
Referral faxed to [facility].  Hence Derrick,CMA

## 2022-02-19 ENCOUNTER — Other Ambulatory Visit (HOSPITAL_COMMUNITY)
Admission: RE | Admit: 2022-02-19 | Discharge: 2022-02-19 | Disposition: A | Discharge status: Home / Self Care | Payer: Medicaid Other | Source: Ambulatory Visit | Attending: Obstetrics and Gynecology | Admitting: Obstetrics and Gynecology

## 2022-02-19 ENCOUNTER — Ambulatory Visit: Payer: Medicaid Other | Admitting: Obstetrics and Gynecology

## 2022-02-19 ENCOUNTER — Encounter: Payer: Self-pay | Admitting: Obstetrics and Gynecology

## 2022-02-19 DIAGNOSIS — Z01419 Encounter for gynecological examination (general) (routine) without abnormal findings: Secondary | ICD-10-CM | POA: Insufficient documentation

## 2022-02-19 DIAGNOSIS — R6882 Decreased libido: Secondary | ICD-10-CM | POA: Diagnosis not present

## 2022-02-19 NOTE — Patient Instructions (Signed)

## 2022-02-19 NOTE — Progress Notes (Signed)
Patient ID: Tiffany Salazar, female   DOB: Nov 13, 1980, 41 y.o.   MRN: [id number]  Tiffany Salazar is a 41 y.o. [id number] female here for a routine annual gynecologic exam.  Current complaints: decreased sex drive.  Has been a problem for over 3 years  but just recently married. No problems with vaginal dryness  Denies abnormal vaginal bleeding, discharge, pelvic pain, problems with intercourse or other gynecologic concerns.    Gynecologic History Patient's last menstrual period was 02/14/2022. Contraception:  BTL Last Pap: 2019. Results were: normal Last mammogram: NA.   Obstetric History OB History  Gravida Para Term Preterm AB Living  7 4 4   3 4   SAB IAB Ectopic Multiple Live Births  1 2     4     # Outcome Date GA Lbr Len/2nd Weight Sex Delivery Anes PTL Lv  7 Term 03/29/19 [redacted]w[redacted]d  7 lb 4.1 oz (3.29 kg) M CS-LTranv Spinal  LIV  6 Term 11/13/16 [redacted]w[redacted]d  8 lb 2 oz (3.685 kg)  CS-Unspec EPI  LIV  5 IAB 2017          4 Term 05/06/11 [redacted]w[redacted]d  7 lb 5 oz (3.317 kg) F CS-Unspec EPI  LIV     Birth Comments: repeat c/s  3 SAB 2005          2 Term 09/28/01 [redacted]w[redacted]d  5 lb 8 oz (2.495 kg) F CS-LTranv EPI  LIV     Birth Comments: c/s ftp  1 IAB 2000            Past Medical History:  Diagnosis Date   AMA (advanced maternal age) multigravida 35+, unspecified trimester 09/20/2018   Anemia    Asthma    seasonal   Eczema of both hands 06/03/2021   Hepatitis C    Hep C   MRSA infection 03/21/2015   Pregnancy complicated by subutex maintenance, antepartum ([place]) 10/20/2018   Subdural hematoma ([place]) 12/26/2013   Substance use disorder    Tricuspid valve replaced    Unwanted fertility 02/09/2019   Signed BTL papers    Past Surgical History:  Procedure Laterality Date   BUNIONECTOMY Bilateral    BUNIONECTOMY Right 01/09/2021   Procedure: TAILOR's BUNIONECTOMY RIGHT;  Surgeon: Evelina Bucy, DPM;  Location: [facility];  Service: Podiatry;  Laterality: Right;   CARDIAC SURGERY     CESAREAN SECTION  x 3    CESAREAN SECTION WITH BILATERAL TUBAL LIGATION Bilateral 03/29/2019   Procedure: CESAREAN SECTION WITH BILATERAL TUBAL LIGATION;  Surgeon: Emily Filbert, MD;  [facility];  Service: Obstetrics;  Laterality: Bilateral;   TEE WITHOUT CARDIOVERSION N/A 03/23/2018   Procedure: TRANSESOPHAGEAL ECHOCARDIOGRAM (TEE);  Surgeon: Pixie Casino, MD;  [facility];  Service: Cardiovascular;  Laterality: N/A;   TEE WITHOUT CARDIOVERSION N/A 03/29/2018   Procedure: TRANSESOPHAGEAL ECHOCARDIOGRAM (TEE);  Surgeon: Gaye Pollack, MD;  Location: [place];  Service: Open Heart Surgery;  Laterality: N/A;   TRICUSPID VALVE REPLACEMENT N/A 03/29/2018   Procedure: TRICUSPID VALVE REPLACEMENT Using 27 mm Magna Ease Mitral Valve Pericardial Bioprosthesis;  Surgeon: Gaye Pollack, MD;  Location: [place];  Service: Open Heart Surgery;  Laterality: N/A;   TUBAL LIGATION      Current Outpatient Medications on File Prior to Visit  Medication Sig Dispense Refill   albuterol (VENTOLIN HFA) 108 (90 Base) MCG/ACT inhaler INHALE TWO PUFFS BY MOUTH EVERY 6 HOURS AS NEEDED FOR WHEEZING OR SHORTNESS OF BREATH 18 g 1  amoxicillin (AMOXIL) 500 MG capsule Take 500 mg by mouth as directed. Take 4 tablets one hour prior to dental procedure 15 capsule 0   buprenorphine (SUBUTEX) 8 MG SUBL SL tablet Place 8 mg under the tongue 3 (three) times daily.      docusate sodium (COLACE) 100 MG capsule Take 100 mg by mouth 2 (two) times daily.     Multiple Vitamin (MULTIVITAMIN WITH MINERALS) TABS tablet Take 1 tablet by mouth daily.     triamcinolone ointment (KENALOG) 0.5 % Apply 1 application. topically 2 (two) times daily. Use on palms 60 g 2   triamcinolone (KENALOG) 0.025 % ointment Apply 1 application. topically 2 (two) times daily as needed. Use on ARMS 80 g 1   No current facility-administered medications on file prior to visit.    Allergies  Allergen Reactions   Gentamicin Rash    Red man syndrome    Vancomycin  Rash    Red man syndrome    Social History   Socioeconomic History   Marital status: Single    Spouse name: Not on file   Number of children: Not on file   Years of education: Not on file   Highest education level: Not on file  Occupational History   Occupation: unemployed  Tobacco Use   Smoking status: Every Day    Packs/day: 1.00    Types: E-cigarettes, Cigarettes   Smokeless tobacco: Current   Tobacco comments:    quit smoking cigarretted; vape  Vaping Use   Vaping Use: Some days  Substance and Sexual Activity   Alcohol use: Not Currently    Alcohol/week: 0.0 standard drinks of alcohol    Comment: Pt stated she has started goint ot rehab clinic   Drug use: Not Currently    Types: IV, Cocaine, Heroin, Marijuana    Comment: March 20, 2018 last time used drugs. subutex   Sexual activity: Yes    Partners: Female, Female    Birth control/protection: Surgical    Comment: offered condoms; declined 06/2019  Other Topics Concern   Not on file  Social History Narrative   The patient has 3 daughters.  Her oldest daughter was born in 2003 cesarean delivery.  Her middle daughter was born in 2012.  Her youngest daughter was born in the fall 2018.      She is currently living with her boyfriend whose name is [name].  Reports her and [name] recently got engaged as a fall 2019.   Social Determinants of Health   Financial Resource Strain: Not on file  Food Insecurity: No Food Insecurity (09/20/2018)   Hunger Vital Sign    Worried About Running Out of Food in the Last Year: Never true    Ran Out of Food in the Last Year: Never true  Transportation Needs: No Transportation Needs (09/20/2018)   PRAPARE - Administrator, Civil Service (Medical): No    Lack of Transportation (Non-Medical): No  Physical Activity: Not on file  Stress: Not on file  Social Connections: Not on file  Intimate Partner Violence: Not on file    Family History  Problem Relation Age of Onset    Thyroid disease Mother    Breast cancer Mother 35       breast   Alcohol abuse Father     The following portions of the patient's history were reviewed and updated as appropriate: allergies, current medications, past family history, past medical history, past social history, past surgical history and problem  list.  Review of Systems Pertinent items noted in HPI and remainder of comprehensive ROS otherwise negative.   Objective:  BP 106/72   Pulse 72   Ht 5\' 8"  (1.727 m)   Wt 195 lb (88.5 kg)   LMP 02/14/2022   BMI 29.65 kg/m  CONSTITUTIONAL: Well-developed, well-nourished female in no acute distress.  HENT:  Normocephalic, atraumatic, External right and left ear normal. Oropharynx is clear and moist EYES: Conjunctivae and EOM are normal. Pupils are equal, round, and reactive to light. No scleral icterus.  NECK: Normal range of motion, supple, no masses.  Normal thyroid.  SKIN: Skin is warm and dry. No rash noted. Not diaphoretic. No erythema. No pallor. NEUROLGIC: Alert and oriented to person, place, and time. Normal reflexes, muscle tone coordination. No cranial nerve deficit noted. PSYCHIATRIC: Normal mood and affect. Normal behavior. Normal judgment and thought content. CARDIOVASCULAR: Normal heart rate noted, regular rhythm RESPIRATORY: Clear to auscultation bilaterally. Effort and breath sounds normal, no problems with respiration noted. BREASTS: Symmetric in size. No masses, skin changes, nipple drainage, or lymphadenopathy. ABDOMEN: Soft, normal bowel sounds, no distention noted.  No tenderness, rebound or guarding.  PELVIC: Normal appearing external genitalia; normal appearing vaginal mucosa and cervix.  No abnormal discharge noted.  Pap smear obtained.  Normal uterine size, no other palpable masses, no uterine or adnexal tenderness. MUSCULOSKELETAL: Normal range of motion. No tenderness.  No cyanosis, clubbing, or edema.  2+ distal pulses.   Assessment:  Annual gynecologic  examination with pap smear Decreased sex drive Plan:  Will follow up results of pap smear and manage accordingly. Discussed decreased sex drive with pt. Discussed Addyi with pt. Will check LFT's due to H/O hep c. Information provided as well. Pt to check cost with pharmacy and to discuss relationship with Suboxone.  Routine preventative health maintenance measures emphasized. Please refer to After Visit Summary for other counseling recommendations.    02/16/2022, MD, FACOG Attending Obstetrician & Gynecologist Center for [facility], [facility]

## 2022-02-19 NOTE — Progress Notes (Signed)
Pt states she has had issues with low libido.  Pt states she may have intercourse 6 times a year.    Pt states she has been on Subutex for several years and wonders if related.

## 2022-02-20 LAB — CYTOLOGY - PAP
Comment: NEGATIVE
Diagnosis: NEGATIVE
High risk HPV: NEGATIVE

## 2022-04-14 DIAGNOSIS — F900 Attention-deficit hyperactivity disorder, predominantly inattentive type: Secondary | ICD-10-CM | POA: Diagnosis not present

## 2022-05-05 DIAGNOSIS — M5386 Other specified dorsopathies, lumbar region: Secondary | ICD-10-CM | POA: Diagnosis not present

## 2022-05-05 DIAGNOSIS — M9905 Segmental and somatic dysfunction of pelvic region: Secondary | ICD-10-CM | POA: Diagnosis not present

## 2022-05-05 DIAGNOSIS — M9903 Segmental and somatic dysfunction of lumbar region: Secondary | ICD-10-CM | POA: Diagnosis not present

## 2022-05-05 DIAGNOSIS — M9904 Segmental and somatic dysfunction of sacral region: Secondary | ICD-10-CM | POA: Diagnosis not present

## 2022-05-12 DIAGNOSIS — M9905 Segmental and somatic dysfunction of pelvic region: Secondary | ICD-10-CM | POA: Diagnosis not present

## 2022-05-12 DIAGNOSIS — M9903 Segmental and somatic dysfunction of lumbar region: Secondary | ICD-10-CM | POA: Diagnosis not present

## 2022-05-12 DIAGNOSIS — M5386 Other specified dorsopathies, lumbar region: Secondary | ICD-10-CM | POA: Diagnosis not present

## 2022-05-12 DIAGNOSIS — M9904 Segmental and somatic dysfunction of sacral region: Secondary | ICD-10-CM | POA: Diagnosis not present

## 2022-07-14 DIAGNOSIS — F900 Attention-deficit hyperactivity disorder, predominantly inattentive type: Secondary | ICD-10-CM | POA: Diagnosis not present

## 2022-07-28 DIAGNOSIS — M9903 Segmental and somatic dysfunction of lumbar region: Secondary | ICD-10-CM | POA: Diagnosis not present

## 2022-07-28 DIAGNOSIS — M9904 Segmental and somatic dysfunction of sacral region: Secondary | ICD-10-CM | POA: Diagnosis not present

## 2022-07-28 DIAGNOSIS — M9905 Segmental and somatic dysfunction of pelvic region: Secondary | ICD-10-CM | POA: Diagnosis not present

## 2022-07-28 DIAGNOSIS — M5386 Other specified dorsopathies, lumbar region: Secondary | ICD-10-CM | POA: Diagnosis not present

## 2022-10-07 DIAGNOSIS — M9905 Segmental and somatic dysfunction of pelvic region: Secondary | ICD-10-CM | POA: Diagnosis not present

## 2022-10-07 DIAGNOSIS — M5386 Other specified dorsopathies, lumbar region: Secondary | ICD-10-CM | POA: Diagnosis not present

## 2022-10-07 DIAGNOSIS — M9903 Segmental and somatic dysfunction of lumbar region: Secondary | ICD-10-CM | POA: Diagnosis not present

## 2022-10-07 DIAGNOSIS — M9904 Segmental and somatic dysfunction of sacral region: Secondary | ICD-10-CM | POA: Diagnosis not present

## 2022-10-09 DIAGNOSIS — M9903 Segmental and somatic dysfunction of lumbar region: Secondary | ICD-10-CM | POA: Diagnosis not present

## 2022-10-09 DIAGNOSIS — M5386 Other specified dorsopathies, lumbar region: Secondary | ICD-10-CM | POA: Diagnosis not present

## 2022-10-09 DIAGNOSIS — M9905 Segmental and somatic dysfunction of pelvic region: Secondary | ICD-10-CM | POA: Diagnosis not present

## 2022-10-09 DIAGNOSIS — M9904 Segmental and somatic dysfunction of sacral region: Secondary | ICD-10-CM | POA: Diagnosis not present

## 2022-10-13 DIAGNOSIS — F900 Attention-deficit hyperactivity disorder, predominantly inattentive type: Secondary | ICD-10-CM | POA: Diagnosis not present

## 2022-10-27 NOTE — Progress Notes (Deleted)
Cardiology Office Note:    Date:  10/27/2022   ID:  Tiffany Salazar, DOB 1981/05/05, MRN [id number]  PCP:  Martyn Malay, MD   [facility] Providers Cardiologist:  Skeet Latch, MD { Click to update primary MD,subspecialty MD or APP then REFRESH:1}    Referring MD: Martyn Malay, MD   Chief Complaint: ***  History of Present Illness:    Tiffany Salazar is a *** 42 y.o. 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  She had TEE that showed a 0.5 cm x 1 cm vegetation on the septal leaflet of the tricuspid valve.  There was severe tricuspid regurgitation.  Initially treated at an outside hospital and started on vancomycin and gentamicin, however she developed "red man" syndrome.  Antibiotics were switched to daptomycin and Bactrim.  Her hospitalization was complicated by septic pulmonary emboli.  She underwent tricuspid valve replacement with a 27 mm Edwards Magna-Ease pericardial valve on 03/29/2018. Postop course relatively uncomplicated.   Last cardiology clinic visit was 06/03/2021 with Dr. Oval Linsey at which time she reported feeling wonderful.  Losing weight for her upcoming wedding in November.  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 Persistent dry skin on bilateral palms, unknown cause. Swollen lymph node in right neck present for a while. Was abstaining from illicit substances. She was advised to try triamcinolone cream to hands BID PRN and return in 1 year for follow-up. TTE 07/04/21 revealed normal LVEF, normal RV, stable function of TVR, no additional significant valve disease.   Today, she is here   Past Medical History:  Diagnosis Date   AMA (advanced maternal age) multigravida 36+, unspecified trimester 09/20/2018   Anemia    Asthma    seasonal   Eczema of both  hands 06/03/2021   Hepatitis C    Hep C   MRSA infection 03/21/2015   Pregnancy complicated by subutex maintenance, antepartum ([place]) 10/20/2018   Subdural hematoma ([place]) 12/26/2013   Substance use disorder    Tricuspid valve replaced    Unwanted fertility 02/09/2019   Signed BTL papers    Past Surgical History:  Procedure Laterality Date   BUNIONECTOMY Bilateral    BUNIONECTOMY Right 01/09/2021   Procedure: TAILOR's BUNIONECTOMY RIGHT;  Surgeon: Evelina Bucy, DPM;  [facility];  Service: Podiatry;  Laterality: Right;   CARDIAC SURGERY     CESAREAN SECTION  x 3   CESAREAN SECTION WITH BILATERAL TUBAL LIGATION Bilateral 03/29/2019   Procedure: CESAREAN SECTION WITH BILATERAL TUBAL LIGATION;  Surgeon: Emily Filbert, MD;  Location: [facility];  Service: Obstetrics;  Laterality: Bilateral;   TEE WITHOUT CARDIOVERSION N/A 03/23/2018   Procedure: TRANSESOPHAGEAL ECHOCARDIOGRAM (TEE);  Surgeon: Pixie Casino, MD;  Location: [facility] ENDOSCOPY;  Service: Cardiovascular;  Laterality: N/A;   TEE WITHOUT CARDIOVERSION N/A 03/29/2018   Procedure: TRANSESOPHAGEAL ECHOCARDIOGRAM (TEE);  Surgeon: Gaye Pollack, MD;  Location: [place];  Service: Open Heart Surgery;  Laterality: N/A;   TRICUSPID VALVE REPLACEMENT N/A 03/29/2018   Procedure: TRICUSPID VALVE REPLACEMENT Using 27 mm Magna Ease Mitral Valve Pericardial Bioprosthesis;  Surgeon: Gaye Pollack, MD;  Location: [place];  Service: Open Heart Surgery;  Laterality: N/A;   TUBAL LIGATION      Current Medications: No outpatient medications have been marked as  taking for the 11/03/22 encounter (Appointment) with Ann Maki, Lanice Schwab, NP.     Allergies:   Gentamicin and Vancomycin   Social History   Socioeconomic History   Marital status: Single    Spouse name: Not on file   Number of children: Not on file   Years of education: Not on file   Highest education level: Not on file  Occupational History   Occupation: unemployed  Tobacco Use    Smoking status: Every Day    Packs/day: 1.00    Types: E-cigarettes, Cigarettes   Smokeless tobacco: Current   Tobacco comments:    quit smoking cigarretted; vape  Vaping Use   Vaping Use: Some days  Substance and Sexual Activity   Alcohol use: Not Currently    Alcohol/week: 0.0 standard drinks of alcohol    Comment: Pt stated she has started goint ot rehab clinic   Drug use: Not Currently    Types: IV, Cocaine, Heroin, Marijuana    Comment: March 20, 2018 last time used drugs. subutex   Sexual activity: Yes    Partners: Female, Female    Birth control/protection: Surgical    Comment: offered condoms; declined 06/2019  Other Topics Concern   Not on file  Social History Narrative   The patient has 3 daughters.  Her oldest daughter was born in 2003 cesarean delivery.  Her middle daughter was born in 2012.  Her youngest daughter was born in the fall 2018.      She is currently living with her boyfriend whose name is Tiffany Salazar.  Reports her and Tiffany Salazar recently got engaged as a fall 2019.   Social Determinants of Health   Financial Resource Strain: Not on file  Food Insecurity: No Food Insecurity (09/20/2018)   Hunger Vital Sign    Worried About Running Out of Food in the Last Year: Never true    Ran Out of Food in the Last Year: Never true  Transportation Needs: No Transportation Needs (09/20/2018)   PRAPARE - Hydrologist (Medical): No    Lack of Transportation (Non-Medical): No  Physical Activity: Not on file  Stress: Not on file  Social Connections: Not on file     Family History: The patient's ***family history includes Alcohol abuse in her father; Breast cancer (age of onset: 28) in her mother; Thyroid disease in her mother.  ROS:   Please see the history of present illness.    *** All other systems reviewed and are negative.  Labs/Other Studies Reviewed:    The following studies were reviewed today:  Echo 07/04/21 1. Left ventricular ejection  fraction, by estimation, is 60 to 65%. The  left ventricle has normal function. The left ventricle has no regional  wall motion abnormalities. The left ventricular internal cavity size was  mildly dilated. Left ventricular  diastolic parameters were normal.   2. Right ventricular systolic function is normal. The right ventricular  size is normal.   3. The mitral valve is normal in structure. Trivial mitral valve  regurgitation. No evidence of mitral stenosis.   4. S/P TV replacement with peak TV gradient 67mHg and mild TR. The  tricuspid valve is has been repaired/replaced.   5. The aortic valve is tricuspid. Aortic valve regurgitation is not  visualized. Mild aortic valve sclerosis is present, with no evidence of  aortic valve stenosis.   6. The inferior vena cava is normal in size with greater than 50%  respiratory variability, suggesting right atrial  pressure of 3 mmHg.    Recent Labs: 01/06/2022: Hemoglobin 13.3; Platelets 224  Recent Lipid Panel    Component Value Date/Time   CHOL 142 01/06/2022 0919   TRIG 56 01/06/2022 0919   HDL 69 01/06/2022 0919   CHOLHDL 2.1 01/06/2022 0919   LDLCALC 61 01/06/2022 0919     Risk Assessment/Calculations:   {Does this patient have ATRIAL FIBRILLATION?:[id number]}       Physical Exam:    VS:  There were no vitals taken for this visit.    Wt Readings from Last 3 Encounters:  02/19/22 195 lb (88.5 kg)  01/06/22 198 lb 12.8 oz (90.2 kg)  11/26/21 195 lb 6 oz (88.6 kg)     GEN: *** Well nourished, well developed in no acute distress HEENT: Normal NECK: No JVD; No carotid bruits CARDIAC: ***RRR, no murmurs, rubs, gallops RESPIRATORY:  Clear to auscultation without rales, wheezing or rhonchi  ABDOMEN: Soft, non-tender, non-distended MUSCULOSKELETAL:  No edema; No deformity. *** pedal pulses, ***bilaterally SKIN: Warm and dry NEUROLOGIC:  Alert and oriented x 3 PSYCHIATRIC:  Normal affect   EKG:  EKG is *** ordered today.  The  ekg ordered today demonstrates ***  No BP recorded.  {Refresh Note OR Click here to enter BP  :1}***    Diagnoses:    No diagnosis found. Assessment and Plan:     Tricuspid regurgitation: S/p TVR 2019. Stable TV function with mild TR on echo 06/2021. Continue SBE prophylaxis.   Endocarditis:   Polysubstance abuse:   {Are you ordering a CV Procedure (e.g. stress test, cath, DCCV, TEE, etc)?   Press F2        :[id number]   Disposition:  Medication Adjustments/Labs and Tests Ordered: Current medicines are reviewed at length with the patient today.  Concerns regarding medicines are outlined above.  No orders of the defined types were placed in this encounter.  No orders of the defined types were placed in this encounter.   There are no Patient Instructions on file for this visit.   Signed, Emmaline Life, NP  10/27/2022 12:51 PM    [facility]

## 2022-11-03 ENCOUNTER — Ambulatory Visit: Payer: Medicaid Other | Attending: Nurse Practitioner | Admitting: Nurse Practitioner

## 2022-11-26 ENCOUNTER — Other Ambulatory Visit: Payer: Self-pay

## 2022-11-26 ENCOUNTER — Encounter (HOSPITAL_COMMUNITY): Payer: Self-pay | Admitting: Emergency Medicine

## 2022-11-26 DIAGNOSIS — M79622 Pain in left upper arm: Secondary | ICD-10-CM | POA: Diagnosis not present

## 2022-11-26 DIAGNOSIS — Z5321 Procedure and treatment not carried out due to patient leaving prior to being seen by health care provider: Secondary | ICD-10-CM | POA: Diagnosis not present

## 2022-11-26 NOTE — ED Triage Notes (Signed)
Pt c/o left upper arm pain x 2 days, pt reports she has had heart surgery before with similar symptoms, pt denies injury

## 2022-11-27 ENCOUNTER — Emergency Department (HOSPITAL_COMMUNITY)
Admission: EM | Admit: 2022-11-27 | Discharge: 2022-11-27 | Discharge status: Against Medical Advice | Payer: Medicaid Other | Attending: Emergency Medicine | Admitting: Emergency Medicine

## 2023-01-12 DIAGNOSIS — F900 Attention-deficit hyperactivity disorder, predominantly inattentive type: Secondary | ICD-10-CM | POA: Diagnosis not present

## 2023-03-23 DIAGNOSIS — M5386 Other specified dorsopathies, lumbar region: Secondary | ICD-10-CM | POA: Diagnosis not present

## 2023-03-23 DIAGNOSIS — M9904 Segmental and somatic dysfunction of sacral region: Secondary | ICD-10-CM | POA: Diagnosis not present

## 2023-03-23 DIAGNOSIS — M9903 Segmental and somatic dysfunction of lumbar region: Secondary | ICD-10-CM | POA: Diagnosis not present

## 2023-03-23 DIAGNOSIS — M9905 Segmental and somatic dysfunction of pelvic region: Secondary | ICD-10-CM | POA: Diagnosis not present

## 2023-03-26 ENCOUNTER — Other Ambulatory Visit: Payer: Self-pay | Admitting: Emergency Medicine

## 2023-03-26 ENCOUNTER — Other Ambulatory Visit: Payer: Self-pay | Admitting: Family Medicine

## 2023-03-26 ENCOUNTER — Ambulatory Visit
Admission: RE | Admit: 2023-03-26 | Discharge: 2023-03-26 | Disposition: A | Discharge status: Home / Self Care | Payer: Medicaid Other | Source: Ambulatory Visit

## 2023-03-26 DIAGNOSIS — Z1231 Encounter for screening mammogram for malignant neoplasm of breast: Secondary | ICD-10-CM

## 2023-03-30 ENCOUNTER — Other Ambulatory Visit (HOSPITAL_COMMUNITY)
Admission: RE | Admit: 2023-03-30 | Discharge: 2023-03-30 | Disposition: A | Discharge status: Home / Self Care | Payer: Medicaid Other | Source: Ambulatory Visit | Attending: Obstetrics and Gynecology | Admitting: Obstetrics and Gynecology

## 2023-03-30 ENCOUNTER — Ambulatory Visit: Payer: Medicaid Other | Admitting: Obstetrics and Gynecology

## 2023-03-30 ENCOUNTER — Encounter: Payer: Self-pay | Admitting: Obstetrics and Gynecology

## 2023-03-30 VITALS — BP 128/79 | HR 53 | Ht 68.0 in | Wt 156.0 lb

## 2023-03-30 DIAGNOSIS — Z01419 Encounter for gynecological examination (general) (routine) without abnormal findings: Secondary | ICD-10-CM | POA: Insufficient documentation

## 2023-03-30 DIAGNOSIS — Z1329 Encounter for screening for other suspected endocrine disorder: Secondary | ICD-10-CM | POA: Diagnosis not present

## 2023-03-30 DIAGNOSIS — Z1339 Encounter for screening examination for other mental health and behavioral disorders: Secondary | ICD-10-CM

## 2023-03-30 DIAGNOSIS — Z131 Encounter for screening for diabetes mellitus: Secondary | ICD-10-CM | POA: Diagnosis not present

## 2023-03-30 MED ORDER — ADDYI 100 MG PO TABS: 100.0000 mg | ORAL_TABLET | Freq: Every day | ORAL | 10 refills | Status: DC

## 2023-03-30 NOTE — Progress Notes (Signed)
Subjective:     Tiffany Salazar is a 42 y.o. female P4 with LMP 03/19/23 and BMI 23 who is here for a comprehensive physical exam. The patient reports no problems. She reports a monthly 5-day period. She is sexually active using BTL for contraception. She continues to experience decrease libido at times. She denies pelvic pain or abnormal discharge. She denies urinary incontinence. She expressed concerns regarding a recent unintentional 50 lb weight loss. Patient has not changed her physical or eating habits. She reports that now that she lost so much weight her appetite has decreased. She is without any other complaints.   Past Medical History:  Diagnosis Date   AMA (advanced maternal age) multigravida 35+, unspecified trimester 09/20/2018   Anemia    Asthma    seasonal   Eczema of both hands 06/03/2021   Hepatitis C    Hep C   MRSA infection 03/21/2015   Pregnancy complicated by subutex maintenance, antepartum (HCC) 10/20/2018   Subdural hematoma (HCC) 12/26/2013   Substance use disorder    Tricuspid valve replaced    Unwanted fertility 02/09/2019   Signed BTL papers   Past Surgical History:  Procedure Laterality Date   BUNIONECTOMY Bilateral    BUNIONECTOMY Right 01/09/2021   Procedure: TAILOR's BUNIONECTOMY RIGHT;  Surgeon: Park Liter, DPM;  Location: [facility] ORS;  Service: Podiatry;  Laterality: Right;   CARDIAC SURGERY     CESAREAN SECTION  x 3   CESAREAN SECTION WITH BILATERAL TUBAL LIGATION Bilateral 03/29/2019   Procedure: CESAREAN SECTION WITH BILATERAL TUBAL LIGATION;  Surgeon: Allie Bossier, MD;  Location: [facility] ORS;  Service: Obstetrics;  Laterality: Bilateral;   TEE WITHOUT CARDIOVERSION N/A 03/23/2018   Procedure: TRANSESOPHAGEAL ECHOCARDIOGRAM (TEE);  Surgeon: Chrystie Nose, MD;  Location: [facility] ENDOSCOPY;  Service: Cardiovascular;  Laterality: N/A;   TEE WITHOUT CARDIOVERSION N/A 03/29/2018   Procedure: TRANSESOPHAGEAL ECHOCARDIOGRAM (TEE);  Surgeon: Alleen Borne, MD;   Location: [facility] OR;  Service: Open Heart Surgery;  Laterality: N/A;   TRICUSPID VALVE REPLACEMENT N/A 03/29/2018   Procedure: TRICUSPID VALVE REPLACEMENT Using 27 mm Magna Ease Mitral Valve Pericardial Bioprosthesis;  Surgeon: Alleen Borne, MD;  Location[facility];  Service: Open Heart Surgery;  Laterality: N/A;   TUBAL LIGATION     Family History  Problem Relation Age of Onset   Thyroid disease Mother    Breast cancer Mother 37       breast   Alcohol abuse Father     Social History   Socioeconomic History   Marital status: Single    Spouse name: Not on file   Number of children: Not on file   Years of education: Not on file   Highest education level: Not on file  Occupational History   Occupation: unemployed  Tobacco Use   Smoking status: Every Day    Current packs/day: 1.00    Types: E-cigarettes, Cigarettes   Smokeless tobacco: Current   Tobacco comments:    quit smoking cigarretted; vape  Vaping Use   Vaping status: Some Days  Substance and Sexual Activity   Alcohol use: Not Currently    Alcohol/week: 0.0 standard drinks of alcohol    Comment: Pt stated she has started goint ot rehab clinic   Drug use: Not Currently    Types: IV, Cocaine, Heroin, Marijuana    Comment: March 20, 2018 last time used drugs. subutex   Sexual activity: Yes    Partners: Female, Female    Birth  control/protection: Surgical    Comment: offered condoms; declined 06/2019  Other Topics Concern   Not on file  Social History Narrative   The patient has 3 daughters.  Her oldest daughter was born in 2003 cesarean delivery.  Her middle daughter was born in 2012.  Her youngest daughter was born in the fall 2018.      She is currently living with her boyfriend whose name is [name].  Reports her and [name] recently got engaged as a fall 2019.   Social Determinants of Health   Financial Resource Strain: Not on file  Food Insecurity: No Food Insecurity (09/20/2018)   Hunger Vital Sign    Worried About  Running Out of Food in the Last Year: Never true    Ran Out of Food in the Last Year: Never true  Transportation Needs: No Transportation Needs (09/20/2018)   PRAPARE - Administrator, Civil Service (Medical): No    Lack of Transportation (Non-Medical): No  Physical Activity: Not on file  Stress: Not on file  Social Connections: Not on file  Intimate Partner Violence: Not on file   Health Maintenance  Topic Date Due   COVID-19 Vaccine (4 - 2023-24 season) 04/25/2022   INFLUENZA VACCINE  03/26/2023   PAP SMEAR-Modifier  02/20/2027   DTaP/Tdap/Td (2 - Td or Tdap) 02/08/2029   Hepatitis C Screening  Completed   HIV Screening  Completed   HPV VACCINES  Aged Out       Review of Systems Pertinent items noted in HPI and remainder of comprehensive ROS otherwise negative.   Objective:  Blood pressure 128/79, pulse (!) 53, height 5\' 8"  (1.727 m), weight 156 lb (70.8 kg), last menstrual period 03/19/2023.   GENERAL: Well-developed, well-nourished female in no acute distress.  HEENT: Normocephalic, atraumatic. Sclerae anicteric.  NECK: Supple. Normal thyroid.  LUNGS: Clear to auscultation bilaterally.  HEART: Regular rate and rhythm. BREASTS: Symmetric in size. No palpable masses or lymphadenopathy, skin changes, or nipple drainage. ABDOMEN: Soft, nontender, nondistended. No organomegaly. PELVIC: Normal external female genitalia. Vagina is pink and rugated.  Normal discharge. Normal appearing cervix. Uterus is normal in size. No adnexal mass or tenderness. Chaperone present during the pelvic exam EXTREMITIES: No cyanosis, clubbing, or edema, 2+ distal pulses.     Assessment:    Healthy female exam.      Plan:    Pap smear collected STI screening  declined Health maintenance labs ordered including HIV to address weight loss Screening mammogram done 8/1 which needs follow up with breast center Patient will be contacted with abnormal results Patient to follow up with  PCP for further evaluation Rx Addyi prescribed (baseline LFTs ordered today) See After Visit Summary for Counseling Recommendations

## 2023-03-30 NOTE — Progress Notes (Addendum)
42 y.o. GYN presents for AEX/PAP/STD screening.  C/o weight loss of 50 lbs since May, [place].  Last Mammogram 03/26/23

## 2023-03-31 ENCOUNTER — Other Ambulatory Visit: Payer: Self-pay | Admitting: Family Medicine

## 2023-03-31 DIAGNOSIS — R928 Other abnormal and inconclusive findings on diagnostic imaging of breast: Secondary | ICD-10-CM

## 2023-04-13 ENCOUNTER — Ambulatory Visit: Payer: Medicaid Other

## 2023-04-13 ENCOUNTER — Ambulatory Visit
Admission: RE | Admit: 2023-04-13 | Discharge: 2023-04-13 | Disposition: A | Discharge status: Home / Self Care | Payer: Medicaid Other | Source: Ambulatory Visit | Attending: Family Medicine | Admitting: Family Medicine

## 2023-04-13 DIAGNOSIS — F900 Attention-deficit hyperactivity disorder, predominantly inattentive type: Secondary | ICD-10-CM | POA: Diagnosis not present

## 2023-04-13 DIAGNOSIS — R928 Other abnormal and inconclusive findings on diagnostic imaging of breast: Secondary | ICD-10-CM

## 2023-04-14 ENCOUNTER — Encounter: Payer: Self-pay | Admitting: Obstetrics and Gynecology

## 2023-04-15 ENCOUNTER — Other Ambulatory Visit: Payer: Self-pay | Admitting: Obstetrics and Gynecology

## 2023-04-15 DIAGNOSIS — M9903 Segmental and somatic dysfunction of lumbar region: Secondary | ICD-10-CM | POA: Diagnosis not present

## 2023-04-15 DIAGNOSIS — M9904 Segmental and somatic dysfunction of sacral region: Secondary | ICD-10-CM | POA: Diagnosis not present

## 2023-04-15 DIAGNOSIS — M9905 Segmental and somatic dysfunction of pelvic region: Secondary | ICD-10-CM | POA: Diagnosis not present

## 2023-04-15 DIAGNOSIS — M5386 Other specified dorsopathies, lumbar region: Secondary | ICD-10-CM | POA: Diagnosis not present

## 2023-04-15 MED ORDER — ADDYI 100 MG PO TABS
100.0000 mg | ORAL_TABLET | Freq: Every day | ORAL | 10 refills | Status: DC
Start: 2023-04-15 — End: 2023-11-16

## 2023-04-29 ENCOUNTER — Encounter (HOSPITAL_BASED_OUTPATIENT_CLINIC_OR_DEPARTMENT_OTHER): Payer: Self-pay | Admitting: Cardiovascular Disease

## 2023-04-29 ENCOUNTER — Ambulatory Visit (HOSPITAL_BASED_OUTPATIENT_CLINIC_OR_DEPARTMENT_OTHER): Payer: Medicaid Other | Admitting: Cardiovascular Disease

## 2023-04-29 VITALS — BP 129/85 | HR 60 | Ht 68.0 in | Wt 151.2 lb

## 2023-04-29 DIAGNOSIS — Z952 Presence of prosthetic heart valve: Secondary | ICD-10-CM | POA: Diagnosis not present

## 2023-04-29 DIAGNOSIS — R0602 Shortness of breath: Secondary | ICD-10-CM

## 2023-04-29 DIAGNOSIS — I079 Rheumatic tricuspid valve disease, unspecified: Secondary | ICD-10-CM | POA: Diagnosis not present

## 2023-04-29 DIAGNOSIS — F17200 Nicotine dependence, unspecified, uncomplicated: Secondary | ICD-10-CM

## 2023-04-29 HISTORY — DX: Shortness of breath: R06.02

## 2023-04-29 NOTE — Patient Instructions (Signed)
Medication Instructions:  Your physician recommends that you continue on your current medications as directed. Please refer to the Current Medication list given to you today.   *If you need a refill on your cardiac medications before your next appointment, please call your pharmacy*  Lab Work: NONE   Testing/Procedures: Your physician has requested that you have an echocardiogram. Echocardiography is a painless test that uses sound waves to create images of your heart. It provides your doctor with information about the size and shape of your heart and how well your heart's chambers and valves are working. This procedure takes approximately one hour. There are no restrictions for this procedure. Please do NOT wear cologne, perfume, aftershave, or lotions (deodorant is allowed). Please arrive 15 minutes prior to your appointment time.  Follow-Up: At [facility], you and your health needs are our priority.  As part of our continuing mission to provide you with exceptional heart care, we have created designated Provider Care Teams.  These Care Teams include your primary Cardiologist (physician) and Advanced Practice Providers (APPs -  Physician Assistants and Nurse Practitioners) who all work together to provide you with the care you need, when you need it.  We recommend signing up for the patient portal called "MyChart".  Sign up information is provided on this After Visit Summary.  MyChart is used to connect with patients for Virtual Visits (Telemedicine).  Patients are able to view lab/test results, encounter notes, upcoming appointments, etc.  Non-urgent messages can be sent to your provider as well.   To learn more about what you can do with MyChart, go to [email or web address].    Your next appointment:   12 month(s)  Provider:   Chilton Si, MD or Gillian Shields, NP

## 2023-04-29 NOTE — Progress Notes (Signed)
Cardiology Office Note:  .   Date:  04/29/2023  ID:  Tiffany Salazar, DOB 05-Mar-1981, MRN [id number] PCP: Westley Chandler, MD  [facility] Providers Cardiologist:  Chilton Si, MD    History of Present Illness: .   Tiffany Salazar is a 42 y.o. 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  Ms. Merkling was admitted 02/2018 with tricuspid valve endocarditis.  She had a TEE that showed a 0.5 cm x 1 cm vegetation on the septal leaflet of the tricuspid valve.  There was severe tricuspid regurgitation.   She was initially treated at an outside hospital and started on vancomycin and gentamicin.  However she developed Red Man syndrome.  This was switched to daptomycin and Bactrim.  Her hospitalization was complicated by septic pulmonary emboli.  She underwent tricuspid valve replacement with a 27 mm Edwards Magna-Ease pericardial valve on 03/29/2018.  Her postoperative course was relatively uncomplicated.   Ms. Tenbrink followed up 04/2018 and was doing well from a cardiac standpoint.  She had a phone visit with [name], PA on 09/2018.  She reports a recent musculoskeletal strain in the chest area. The discomfort occurred after a sudden movement while in a car, attempting to catch a falling pet. The pain is localized and increases upon palpation, causing the patient some anxiety due to its proximity to the heart.  In addition to this, the patient has noticed increasing shortness of breath over the past year, particularly when engaging in physical activities such as cleaning and moving around. She attributes this to her habit of vaping, acknowledging its potential detrimental effects on lung function.  Ms. Kover has also experienced significant weight loss, approximately 50 pounds since March. This is attributed to increased physical activity and dietary changes. Despite this, recent lab  results indicated prediabetes, prompting the patient to consider further dietary modifications.  She expressed concern about the longevity of the valve after reading online that bioprosthetic valves typically last 10 to 20 years. This has caused significant distress, with the patient fearing sudden valve failure and potential mortality.  Despite these concerns, the patient reported no other alarming symptoms such as chest pain, pressure, or swelling in the legs or feet. She has been managing her health proactively, but expressed a desire to prioritize her health more consistently.    ROS:  As per HPI  Studies Reviewed: .       Echo 06/2021:  1. Left ventricular ejection fraction, by estimation, is 60 to 65%. The  left ventricle has normal function. The left ventricle has no regional  wall motion abnormalities. The left ventricular internal cavity size was  mildly dilated. Left ventricular  diastolic parameters were normal.   2. Right ventricular systolic function is normal. The right ventricular  size is normal.   3. The mitral valve is normal in structure. Trivial mitral valve  regurgitation. No evidence of mitral stenosis.   4. S/P TV replacement with peak TV gradient and mild TR. The  tricuspid valve is has been repaired/replaced.   5. The aortic valve is tricuspid. Aortic valve regurgitation is not  visualized. Mild aortic valve sclerosis is present, with no evidence of  aortic valve stenosis.   6. The inferior vena cava is normal in size with greater than 50%  respiratory variability, suggesting right atrial pressure of 3 mmHg.   Risk Assessment/Calculations:  Physical Exam:    VS:  BP 129/85 (BP Location: Left Arm, Patient Position: Sitting, Cuff Size: Normal)   Pulse 60   Ht 5\' 8"  (1.727 m)   Wt 151 lb 3.2 oz (68.6 kg)   SpO2 100%   BMI 22.99 kg/m  , BMI Body mass index is 22.99 kg/m. GENERAL:  Well appearing.  Tearful at times HEENT: Pupils equal  round and reactive, fundi not visualized, oral mucosa unremarkable NECK:  No jugular venous distention, waveform within normal limits, carotid upstroke brisk and symmetric, no bruits, no thyromegaly LUNGS:  Clear to auscultation bilaterally HEART:  RRR.  PMI not displaced or sustained,S1 and S2 within normal limits, no S3, no S4, no clicks, no rubs, no murmurs ABD:  Flat, positive bowel sounds normal in frequency in pitch, no bruits, no rebound, no guarding, no midline pulsatile mass, no hepatomegaly, no splenomegaly EXT:  2 plus pulses throughout, no edema, no cyanosis no clubbing SKIN:  No rashes no nodules NEURO:  Cranial nerves II through XII grossly intact, motor grossly intact throughout PSYCH:  Cognitively intact, oriented to person place and time   ASSESSMENT AND PLAN: .    # Tricuspid Valve Replacement Patient has concerns about the longevity of her pig valve replacement due to information found online. No symptoms of valve failure noted. Last echo in 2022 showed minimal tricuspid valve leakage. -Schedule an echocardiogram to assess current valve function.  # Chest Pain Likely musculoskeletal in nature due to a sudden movement while in the car. Pain is localized and reproducible with palpation. No associated cardiac symptoms. -Consider Voltaren gel for local pain relief.  # Shortness of Breath Noted over the past year, possibly related to vaping and physical exertion. No associated cardiac symptoms. -Encourage cessation of vaping for potential improvement in symptoms.  # Prediabetes Elevated A1C noted, likely related to carbohydrate intake. -Encourage low carbohydrate diet to manage blood sugar levels.        Dispo: f/u 1 year  Signed, Chilton Si, MD

## 2023-05-12 ENCOUNTER — Ambulatory Visit
Admission: RE | Admit: 2023-05-12 | Discharge: 2023-05-12 | Disposition: A | Discharge status: Home / Self Care | Payer: Medicaid Other | Source: Ambulatory Visit | Attending: Family Medicine

## 2023-05-12 ENCOUNTER — Ambulatory Visit
Admission: RE | Admit: 2023-05-12 | Discharge: 2023-05-12 | Disposition: A | Discharge status: Home / Self Care | Payer: Medicaid Other | Source: Ambulatory Visit | Attending: Family Medicine | Admitting: Family Medicine

## 2023-05-12 ENCOUNTER — Ambulatory Visit: Payer: Medicaid Other

## 2023-05-12 DIAGNOSIS — R921 Mammographic calcification found on diagnostic imaging of breast: Secondary | ICD-10-CM | POA: Diagnosis not present

## 2023-05-12 DIAGNOSIS — R590 Localized enlarged lymph nodes: Secondary | ICD-10-CM | POA: Diagnosis not present

## 2023-05-12 DIAGNOSIS — R928 Other abnormal and inconclusive findings on diagnostic imaging of breast: Secondary | ICD-10-CM

## 2023-05-13 ENCOUNTER — Other Ambulatory Visit: Payer: Self-pay | Admitting: Family Medicine

## 2023-05-13 ENCOUNTER — Encounter: Payer: Self-pay | Admitting: Family Medicine

## 2023-05-13 DIAGNOSIS — R921 Mammographic calcification found on diagnostic imaging of breast: Secondary | ICD-10-CM

## 2023-05-13 DIAGNOSIS — N6489 Other specified disorders of breast: Secondary | ICD-10-CM

## 2023-05-13 DIAGNOSIS — R590 Localized enlarged lymph nodes: Secondary | ICD-10-CM

## 2023-05-14 ENCOUNTER — Encounter: Payer: Self-pay | Admitting: Family Medicine

## 2023-05-14 DIAGNOSIS — J302 Other seasonal allergic rhinitis: Secondary | ICD-10-CM

## 2023-05-14 MED ORDER — ALBUTEROL SULFATE HFA 108 (90 BASE) MCG/ACT IN AERS
INHALATION_SPRAY | RESPIRATORY_TRACT | 0 refills | Status: DC
Start: 2023-05-14 — End: 2023-06-16

## 2023-05-20 ENCOUNTER — Ambulatory Visit (INDEPENDENT_AMBULATORY_CARE_PROVIDER_SITE_OTHER): Payer: Medicaid Other

## 2023-05-20 DIAGNOSIS — I079 Rheumatic tricuspid valve disease, unspecified: Secondary | ICD-10-CM

## 2023-05-20 DIAGNOSIS — Z952 Presence of prosthetic heart valve: Secondary | ICD-10-CM

## 2023-05-20 LAB — ECHOCARDIOGRAM COMPLETE
Area-P 1/2: 4.31 cm2
S' Lateral: 3.43 cm

## 2023-05-22 ENCOUNTER — Ambulatory Visit
Admission: RE | Admit: 2023-05-22 | Discharge: 2023-05-22 | Disposition: A | Discharge status: Home / Self Care | Payer: Medicaid Other | Source: Ambulatory Visit | Attending: Family Medicine | Admitting: Family Medicine

## 2023-05-22 ENCOUNTER — Other Ambulatory Visit: Payer: Self-pay

## 2023-05-22 ENCOUNTER — Emergency Department (HOSPITAL_COMMUNITY)
Admission: EM | Admit: 2023-05-22 | Discharge: 2023-05-23 | Discharge status: Against Medical Advice | Payer: Medicaid Other | Attending: Emergency Medicine | Admitting: Emergency Medicine

## 2023-05-22 DIAGNOSIS — R921 Mammographic calcification found on diagnostic imaging of breast: Secondary | ICD-10-CM

## 2023-05-22 DIAGNOSIS — N6489 Other specified disorders of breast: Secondary | ICD-10-CM

## 2023-05-22 DIAGNOSIS — Z5321 Procedure and treatment not carried out due to patient leaving prior to being seen by health care provider: Secondary | ICD-10-CM | POA: Insufficient documentation

## 2023-05-22 DIAGNOSIS — L7622 Postprocedural hemorrhage and hematoma of skin and subcutaneous tissue following other procedure: Secondary | ICD-10-CM | POA: Diagnosis not present

## 2023-05-22 DIAGNOSIS — N6011 Diffuse cystic mastopathy of right breast: Secondary | ICD-10-CM | POA: Diagnosis not present

## 2023-05-22 DIAGNOSIS — R590 Localized enlarged lymph nodes: Secondary | ICD-10-CM

## 2023-05-22 HISTORY — PX: BREAST BIOPSY: SHX20

## 2023-05-22 NOTE — ED Triage Notes (Signed)
The pt had a rt breast biopsy  this am at [facility].  The incision has been bleeding all day and will not stop .   Lmp  two weeks ago

## 2023-05-22 NOTE — ED Notes (Signed)
Pt called for vitals no answer and Pt not seen in waiting room.

## 2023-05-25 LAB — SURGICAL PATHOLOGY

## 2023-06-06 ENCOUNTER — Emergency Department (HOSPITAL_COMMUNITY)
Admission: EM | Admit: 2023-06-06 | Discharge: 2023-06-07 | Disposition: A | Discharge status: Home / Self Care | Payer: Medicaid Other | Attending: Emergency Medicine | Admitting: Emergency Medicine

## 2023-06-06 ENCOUNTER — Other Ambulatory Visit: Payer: Self-pay

## 2023-06-06 DIAGNOSIS — I959 Hypotension, unspecified: Secondary | ICD-10-CM | POA: Diagnosis not present

## 2023-06-06 DIAGNOSIS — R911 Solitary pulmonary nodule: Secondary | ICD-10-CM | POA: Diagnosis not present

## 2023-06-06 DIAGNOSIS — R1084 Generalized abdominal pain: Secondary | ICD-10-CM | POA: Diagnosis not present

## 2023-06-06 DIAGNOSIS — R918 Other nonspecific abnormal finding of lung field: Secondary | ICD-10-CM | POA: Diagnosis not present

## 2023-06-06 DIAGNOSIS — R110 Nausea: Secondary | ICD-10-CM | POA: Diagnosis not present

## 2023-06-06 DIAGNOSIS — R109 Unspecified abdominal pain: Secondary | ICD-10-CM | POA: Diagnosis not present

## 2023-06-06 DIAGNOSIS — D72829 Elevated white blood cell count, unspecified: Secondary | ICD-10-CM | POA: Diagnosis not present

## 2023-06-06 DIAGNOSIS — I2699 Other pulmonary embolism without acute cor pulmonale: Secondary | ICD-10-CM | POA: Diagnosis not present

## 2023-06-06 DIAGNOSIS — R079 Chest pain, unspecified: Secondary | ICD-10-CM | POA: Diagnosis not present

## 2023-06-06 DIAGNOSIS — R1111 Vomiting without nausea: Secondary | ICD-10-CM | POA: Diagnosis not present

## 2023-06-06 NOTE — ED Triage Notes (Signed)
Pt arrives to [facility] c/o bilateral flank pain that radiates to abdomin x 1 day. Pt endorses dysuria, N/V and dark urine x several days. Pt with previous hx of kidney issues.

## 2023-06-07 ENCOUNTER — Emergency Department (HOSPITAL_COMMUNITY): Payer: Medicaid Other

## 2023-06-07 DIAGNOSIS — R109 Unspecified abdominal pain: Secondary | ICD-10-CM | POA: Diagnosis not present

## 2023-06-07 DIAGNOSIS — I2699 Other pulmonary embolism without acute cor pulmonale: Secondary | ICD-10-CM | POA: Diagnosis not present

## 2023-06-07 DIAGNOSIS — R079 Chest pain, unspecified: Secondary | ICD-10-CM | POA: Diagnosis not present

## 2023-06-07 DIAGNOSIS — R918 Other nonspecific abnormal finding of lung field: Secondary | ICD-10-CM | POA: Diagnosis not present

## 2023-06-07 LAB — COMPREHENSIVE METABOLIC PANEL
ALT: 20 U/L (ref 0–44)
AST: 25 U/L (ref 15–41)
Albumin: 4.3 g/dL (ref 3.5–5.0)
Alkaline Phosphatase: 62 U/L (ref 38–126)
Anion gap: 13 (ref 5–15)
BUN: 11 mg/dL (ref 6–20)
CO2: 22 mmol/L (ref 22–32)
Calcium: 9.3 mg/dL (ref 8.9–10.3)
Chloride: 106 mmol/L (ref 98–111)
Creatinine, Ser: 0.93 mg/dL (ref 0.44–1.00)
GFR, Estimated: >60 mL/min (ref >60)
Glucose, Bld: 97 mg/dL (ref 70–99)
Potassium: 3.7 mmol/L (ref 3.5–5.1)
Sodium: 141 mmol/L (ref 135–145)
Total Bilirubin: 0.6 mg/dL (ref 0.3–1.2)
Total Protein: 7.3 g/dL (ref 6.5–8.1)

## 2023-06-07 LAB — CBC WITH DIFFERENTIAL/PLATELET
Abs Immature Granulocytes: 0.03 10*3/uL (ref 0.00–0.07)
Basophils Absolute: 0.1 10*3/uL (ref 0.0–0.1)
Basophils Relative: 1 %
Eosinophils Absolute: 0.3 10*3/uL (ref 0.0–0.5)
Eosinophils Relative: 2 %
HCT: 42.7 % (ref 36.0–46.0)
Hemoglobin: 13.9 g/dL (ref 12.0–15.0)
Immature Granulocytes: 0 %
Lymphocytes Relative: 16 %
Lymphs Abs: 2.2 10*3/uL (ref 0.7–4.0)
MCH: 31.2 pg (ref 26.0–34.0)
MCHC: 32.6 g/dL (ref 30.0–36.0)
MCV: 95.7 fL (ref 80.0–100.0)
Monocytes Absolute: 0.5 10*3/uL (ref 0.1–1.0)
Monocytes Relative: 4 %
Neutro Abs: 10.4 10*3/uL — ABNORMAL HIGH (ref 1.7–7.7)
Neutrophils Relative %: 77 %
Platelets: 215 10*3/uL (ref 150–400)
RBC: 4.46 MIL/uL (ref 3.87–5.11)
RDW: 13.3 % (ref 11.5–15.5)
WBC: 13.5 10*3/uL — ABNORMAL HIGH (ref 4.0–10.5)
nRBC: 0 % (ref 0.0–0.2)

## 2023-06-07 LAB — I-STAT CG4 LACTIC ACID, ED
Lactic Acid, Venous: 1.2 mmol/L (ref 0.5–1.9)
Lactic Acid, Venous: 2.7 mmol/L (ref 0.5–1.9)

## 2023-06-07 LAB — URINALYSIS, W/ REFLEX TO CULTURE (INFECTION SUSPECTED)
Bilirubin Urine: NEGATIVE
Glucose, UA: NEGATIVE mg/dL
Ketones, ur: NEGATIVE mg/dL
Leukocytes,Ua: NEGATIVE
Nitrite: NEGATIVE
Protein, ur: 30 mg/dL — AB
Specific Gravity, Urine: 1.015 (ref 1.005–1.030)
pH: 8 (ref 5.0–8.0)

## 2023-06-07 LAB — I-STAT CHEM 8, ED
BUN: 12 mg/dL (ref 6–20)
Calcium, Ion: 1.06 mmol/L — ABNORMAL LOW (ref 1.15–1.40)
Chloride: 105 mmol/L (ref 98–111)
Creatinine, Ser: 1 mg/dL (ref 0.44–1.00)
Glucose, Bld: 95 mg/dL (ref 70–99)
HCT: 44 % (ref 36.0–46.0)
Hemoglobin: 15 g/dL (ref 12.0–15.0)
Potassium: 3.6 mmol/L (ref 3.5–5.1)
Sodium: 141 mmol/L (ref 135–145)
TCO2: 21 mmol/L — ABNORMAL LOW (ref 22–32)

## 2023-06-07 LAB — HCG, SERUM, QUALITATIVE: Preg, Serum: NEGATIVE

## 2023-06-07 MED ORDER — ONDANSETRON HCL 4 MG/2ML IJ SOLN
4.0000 mg | Freq: Once | INTRAMUSCULAR | Status: AC
  Administered 2023-06-07: 4 mg via INTRAVENOUS
  Filled 2023-06-07: qty 2

## 2023-06-07 MED ORDER — HYDROMORPHONE HCL 1 MG/ML IJ SOLN
1.0000 mg | Freq: Once | INTRAMUSCULAR | Status: AC
  Administered 2023-06-07: 1 mg via INTRAVENOUS
  Filled 2023-06-07: qty 1

## 2023-06-07 MED ORDER — KETOROLAC TROMETHAMINE 15 MG/ML IJ SOLN
15.0000 mg | Freq: Once | INTRAMUSCULAR | Status: AC
  Administered 2023-06-07: 15 mg via INTRAVENOUS
  Filled 2023-06-07: qty 1

## 2023-06-07 MED ORDER — KETAMINE HCL 50 MG/5ML IJ SOSY
0.3000 mg/kg | PREFILLED_SYRINGE | Freq: Once | INTRAMUSCULAR | Status: DC
  Filled 2023-06-07: qty 5

## 2023-06-07 MED ORDER — IOHEXOL 350 MG/ML SOLN
75.0000 mL | Freq: Once | INTRAVENOUS | Status: AC | PRN
  Administered 2023-06-07: 75 mL via INTRAVENOUS

## 2023-06-07 MED ORDER — ONDANSETRON 4 MG PO TBDP: 4.0000 mg | ORAL_TABLET | Freq: Three times a day (TID) | ORAL | 0 refills | Status: DC | PRN

## 2023-06-07 NOTE — ED Notes (Signed)
Patient's significant other advised to call him when patient was d/c'd.

## 2023-06-07 NOTE — ED Provider Notes (Signed)
[place] EMERGENCY DEPARTMENT AT [facility] Provider Note   CSN: [id number] Arrival date & time: 06/06/23  2343     History  Chief Complaint  Patient presents with   Flank Pain   Abdominal Pain    Tiffany Salazar is a 42 y.o. female.  The history is provided by the patient and medical records.  Flank Pain Associated symptoms include abdominal pain.  Abdominal Pain Tiffany Salazar is a 42 y.o. female who presents to the Emergency Department complaining of flank pain.  She presents to the emergency department for evaluation of bilateral flank pain that started 45 minutes prior to ED arrival.  Pain is severe in nature and radiates to the abdomen bilaterally.  It is described as burning.  Unsure if she is having fevers.  She is having vomiting that she states is due to the pain. She has a history of tricuspid valve endocarditis in 2019 status post bioprosthetic replacement.  She does not use any IV drugs.  No recent dental procedures.  No recent infections, fevers, night sweats.  She has been undergoing weight loss.  She did recently have a breast biopsy.    Home Medications Prior to Admission medications   Medication Sig Start Date End Date Taking? Authorizing Provider  albuterol (VENTOLIN HFA) 108 (90 Base) MCG/ACT inhaler INHALE TWO PUFFS BY MOUTH EVERY 6 HOURS AS NEEDED FOR WHEEZING OR SHORTNESS OF BREATH SCHEDULE VISIT BEFORE NEXT FILL 05/14/23  Yes Westley Chandler, MD  amphetamine-dextroamphetamine (ADDERALL XR) 30 MG 24 hr capsule Take 30 mg by mouth 2 (two) times daily.   Yes [provider]  buprenorphine (SUBUTEX) 8 MG SUBL SL tablet Place 8 mg under the tongue daily.   Yes [provider]  Flibanserin (ADDYI) 100 MG TABS Take 1 tablet (100 mg total) by mouth at bedtime. 04/15/23  Yes Constant, Peggy, MD  Multiple Vitamin (MULTIVITAMIN WITH MINERALS) TABS tablet Take 1 tablet by mouth daily.   Yes [provider]  ondansetron (ZOFRAN-ODT) 4  MG disintegrating tablet Take 1 tablet (4 mg total) by mouth every 8 (eight) hours as needed. 06/07/23  Yes Tilden Fossa, MD      Allergies    Gentamicin and Vancomycin    Review of Systems   Review of Systems  Gastrointestinal:  Positive for abdominal pain.  Genitourinary:  Positive for flank pain.  All other systems reviewed and are negative.   Physical Exam Updated Vital Signs BP 99/63   Pulse (!) 48   Temp 97.7 F (36.5 C) (Oral)   Resp 16   Ht 5\' 8"  (1.727 m)   Wt 66.2 kg   LMP 05/08/2023   SpO2 99%   BMI 22.20 kg/m  Physical Exam Vitals and nursing note reviewed.  Constitutional:      Appearance: She is well-developed.     Comments: Appears uncomfortable  HENT:     Head: Normocephalic and atraumatic.  Cardiovascular:     Rate and Rhythm: Normal rate and regular rhythm.     Heart sounds: No murmur heard. Pulmonary:     Effort: Pulmonary effort is normal. No respiratory distress.     Breath sounds: Normal breath sounds.  Abdominal:     Palpations: Abdomen is soft.     Tenderness: There is no guarding or rebound.     Comments: Mild generalized abdominal tenderness  Musculoskeletal:        General: No tenderness.  Skin:    General: Skin is warm  and dry.  Neurological:     Mental Status: She is alert and oriented to person, place, and time.     Comments: 5 out of 5 strength in all 4 extremities with sensation to light touch intact in all 4 extremities.  Normal gait.  Psychiatric:        Behavior: Behavior normal.     ED Results / Procedures / Treatments   Labs (all labs ordered are listed, but only abnormal results are displayed) Labs Reviewed  CBC WITH DIFFERENTIAL/PLATELET - Abnormal; Notable for the following components:      Result Value   WBC 13.5 (*)    Neutro Abs 10.4 (*)    All other components within normal limits  URINALYSIS, W/ REFLEX TO CULTURE (INFECTION SUSPECTED) - Abnormal; Notable for the following components:   APPearance CLOUDY  (*)    Hgb urine dipstick SMALL (*)    Protein, ur 30 (*)    Bacteria, UA RARE (*)    All other components within normal limits  I-STAT CHEM 8, ED - Abnormal; Notable for the following components:   Calcium, Ion 1.06 (*)    TCO2 21 (*)    All other components within normal limits  I-STAT CG4 LACTIC ACID, ED - Abnormal; Notable for the following components:   Lactic Acid, Venous 2.7 (*)    All other components within normal limits  COMPREHENSIVE METABOLIC PANEL  HCG, SERUM, QUALITATIVE  I-STAT CG4 LACTIC ACID, ED    EKG None  Radiology CT Angio Chest PE W/Cm &/Or Wo Cm  Result Date: 06/07/2023 CLINICAL DATA:  42 year old female with history of bilateral flank pain with nausea and vomiting and dark urine. Clinical suspicion (high probability) for pulmonary embolism. EXAM: CT ANGIOGRAPHY CHEST CT ABDOMEN AND PELVIS WITH CONTRAST TECHNIQUE: Multidetector CT imaging of the chest was performed using the standard protocol during bolus administration of intravenous contrast. Multiplanar CT image reconstructions and MIPs were obtained to evaluate the vascular anatomy. Multidetector CT imaging of the abdomen and pelvis was performed using the standard protocol during bolus administration of intravenous contrast. RADIATION DOSE REDUCTION: This exam was performed according to the departmental dose-optimization program which includes automated exposure control, adjustment of the mA and/or kV according to patient size and/or use of iterative reconstruction technique. CONTRAST:  75mL OMNIPAQUE IOHEXOL 350 MG/ML SOLN COMPARISON:  CT of the chest, abdomen and pelvis 12/26/2013. CT of the abdomen and pelvis 06/07/2023. FINDINGS: CTA CHEST FINDINGS Cardiovascular: No filling defects are noted in the pulmonary arterial tree to suggest pulmonary embolism. Heart size is normal. There is no significant pericardial fluid, thickening or pericardial calcification. No atherosclerotic calcifications are noted in the  thoracic aorta or the coronary arteries. Status post median sternotomy for tricuspid valve replacement with a stented bioprosthesis. Mediastinum/Nodes: No pathologically enlarged mediastinal or hilar lymph nodes. Esophagus is unremarkable in appearance. No axillary lymphadenopathy. Lungs/Pleura: Some scattered areas of architectural distortion are noted in the right lung, favored to represent areas of chronic post infectious or inflammatory scarring (or potentially postoperative scarring), although there is no recent prior study available for comparison and these are new compared to remote prior study from 2015 (prior to patient's cardiac surgery). This is most evident in the superior aspect of the right middle lobe abutting the minor fissure (axial image 77 of series 6). A few scattered small pulmonary nodules are also noted measuring 4 mm or less in size. No larger more suspicious appearing pulmonary nodules or masses are noted. No acute consolidative  airspace disease. No pleural effusions. Musculoskeletal: Median sternotomy wires. There are no aggressive appearing lytic or blastic lesions noted in the visualized portions of the skeleton. Review of the MIP images confirms the above findings. CT ABDOMEN and PELVIS FINDINGS Hepatobiliary: No suspicious cystic or solid hepatic lesions. Mild periportal edema (nonspecific). No intra or extrahepatic biliary ductal dilatation. Gallbladder is unremarkable in appearance. Pancreas: No pancreatic mass. No pancreatic ductal dilatation. No peripancreatic fluid collections or inflammatory changes. Spleen: Unremarkable. Adrenals/Urinary Tract: Bilateral kidneys and bilateral adrenal glands are normal in appearance. No hydroureteronephrosis. Urinary bladder is unremarkable in appearance. Stomach/Bowel: The appearance of the stomach is normal. No pathologic dilatation of small bowel or colon. The appendix is not confidently identified and may be surgically absent. Regardless,  there are no inflammatory changes noted adjacent to the cecum to suggest the presence of an acute appendicitis at this time. Vascular/Lymphatic: No significant atherosclerotic disease, aneurysm or dissection noted in the abdominal or pelvic vasculature. No lymphadenopathy noted in the abdomen or pelvis. Reproductive: Bilateral tubal ligation clips are incidentally noted. Uterus and right ovary are otherwise unremarkable in appearance. Simple appearing cystic lesion in the left ovary measuring 2.7 cm in diameter is most compatible with a follicle (no imaging follow-up recommended). Other: No significant volume of ascites.  No pneumoperitoneum. Musculoskeletal: There are no aggressive appearing lytic or blastic lesions noted in the visualized portions of the skeleton. Review of the MIP images confirms the above findings. IMPRESSION: 1. No acute findings in the chest to account for the patient's symptoms. Specifically, no evidence of pulmonary embolism. 2. Mild periportal edema in the liver. This is a nonspecific finding. Correlation with liver function tests is recommended. 3. Compared to the prior chest CT from 2015, the patient has undergone cardiac surgery (mitral valve replacement). There are what appear to be areas of probable chronic scarring in the right lung, as detailed above. Multiple small pulmonary nodules are also noted measuring 4 mm or less in size. These are nonspecific, but statistically likely benign. No follow-up needed if patient is low-risk (and has no known or suspected primary neoplasm). Non-contrast chest CT can be considered in 12 months if patient is high-risk. This recommendation follows the consensus statement: Guidelines for Management of Incidental Pulmonary Nodules Detected on CT Images: From the Fleischner Society 2017; Radiology 2017; 284:228-243. 4. Additional incidental findings, as above. Electronically Signed   By: Trudie Reed M.D.   On: 06/07/2023 06:32   CT ABDOMEN PELVIS  W CONTRAST  Result Date: 06/07/2023 CLINICAL DATA:  42 year old female with history of bilateral flank pain with nausea and vomiting and dark urine. Clinical suspicion (high probability) for pulmonary embolism. EXAM: CT ANGIOGRAPHY CHEST CT ABDOMEN AND PELVIS WITH CONTRAST TECHNIQUE: Multidetector CT imaging of the chest was performed using the standard protocol during bolus administration of intravenous contrast. Multiplanar CT image reconstructions and MIPs were obtained to evaluate the vascular anatomy. Multidetector CT imaging of the abdomen and pelvis was performed using the standard protocol during bolus administration of intravenous contrast. RADIATION DOSE REDUCTION: This exam was performed according to the departmental dose-optimization program which includes automated exposure control, adjustment of the mA and/or kV according to patient size and/or use of iterative reconstruction technique. CONTRAST:  75mL OMNIPAQUE IOHEXOL 350 MG/ML SOLN COMPARISON:  CT of the chest, abdomen and pelvis 12/26/2013. CT of the abdomen and pelvis 06/07/2023. FINDINGS: CTA CHEST FINDINGS Cardiovascular: No filling defects are noted in the pulmonary arterial tree to suggest pulmonary embolism. Heart size is normal.  There is no significant pericardial fluid, thickening or pericardial calcification. No atherosclerotic calcifications are noted in the thoracic aorta or the coronary arteries. Status post median sternotomy for tricuspid valve replacement with a stented bioprosthesis. Mediastinum/Nodes: No pathologically enlarged mediastinal or hilar lymph nodes. Esophagus is unremarkable in appearance. No axillary lymphadenopathy. Lungs/Pleura: Some scattered areas of architectural distortion are noted in the right lung, favored to represent areas of chronic post infectious or inflammatory scarring (or potentially postoperative scarring), although there is no recent prior study available for comparison and these are new compared  to remote prior study from 2015 (prior to patient's cardiac surgery). This is most evident in the superior aspect of the right middle lobe abutting the minor fissure (axial image 77 of series 6). A few scattered small pulmonary nodules are also noted measuring 4 mm or less in size. No larger more suspicious appearing pulmonary nodules or masses are noted. No acute consolidative airspace disease. No pleural effusions. Musculoskeletal: Median sternotomy wires. There are no aggressive appearing lytic or blastic lesions noted in the visualized portions of the skeleton. Review of the MIP images confirms the above findings. CT ABDOMEN and PELVIS FINDINGS Hepatobiliary: No suspicious cystic or solid hepatic lesions. Mild periportal edema (nonspecific). No intra or extrahepatic biliary ductal dilatation. Gallbladder is unremarkable in appearance. Pancreas: No pancreatic mass. No pancreatic ductal dilatation. No peripancreatic fluid collections or inflammatory changes. Spleen: Unremarkable. Adrenals/Urinary Tract: Bilateral kidneys and bilateral adrenal glands are normal in appearance. No hydroureteronephrosis. Urinary bladder is unremarkable in appearance. Stomach/Bowel: The appearance of the stomach is normal. No pathologic dilatation of small bowel or colon. The appendix is not confidently identified and may be surgically absent. Regardless, there are no inflammatory changes noted adjacent to the cecum to suggest the presence of an acute appendicitis at this time. Vascular/Lymphatic: No significant atherosclerotic disease, aneurysm or dissection noted in the abdominal or pelvic vasculature. No lymphadenopathy noted in the abdomen or pelvis. Reproductive: Bilateral tubal ligation clips are incidentally noted. Uterus and right ovary are otherwise unremarkable in appearance. Simple appearing cystic lesion in the left ovary measuring 2.7 cm in diameter is most compatible with a follicle (no imaging follow-up recommended).  Other: No significant volume of ascites.  No pneumoperitoneum. Musculoskeletal: There are no aggressive appearing lytic or blastic lesions noted in the visualized portions of the skeleton. Review of the MIP images confirms the above findings. IMPRESSION: 1. No acute findings in the chest to account for the patient's symptoms. Specifically, no evidence of pulmonary embolism. 2. Mild periportal edema in the liver. This is a nonspecific finding. Correlation with liver function tests is recommended. 3. Compared to the prior chest CT from 2015, the patient has undergone cardiac surgery (mitral valve replacement). There are what appear to be areas of probable chronic scarring in the right lung, as detailed above. Multiple small pulmonary nodules are also noted measuring 4 mm or less in size. These are nonspecific, but statistically likely benign. No follow-up needed if patient is low-risk (and has no known or suspected primary neoplasm). Non-contrast chest CT can be considered in 12 months if patient is high-risk. This recommendation follows the consensus statement: Guidelines for Management of Incidental Pulmonary Nodules Detected on CT Images: From the Fleischner Society 2017; Radiology 2017; 284:228-243. 4. Additional incidental findings, as above. Electronically Signed   By: Trudie Reed M.D.   On: 06/07/2023 06:32   CT Renal Stone Study  Result Date: 06/07/2023 CLINICAL DATA:  Abdominal and flank pain bilaterally.  Dysuria. EXAM:  CT ABDOMEN AND PELVIS WITHOUT CONTRAST TECHNIQUE: Multidetector CT imaging of the abdomen and pelvis was performed following the standard protocol without IV contrast. RADIATION DOSE REDUCTION: This exam was performed according to the departmental dose-optimization program which includes automated exposure control, adjustment of the mA and/or kV according to patient size and/or use of iterative reconstruction technique. COMPARISON:  CT chest abdomen and pelvis 12/2000 FINDINGS:  Lower chest: No acute abnormality. Hepatobiliary: No focal liver abnormality is seen. No gallstones, gallbladder wall thickening, or biliary dilatation. Pancreas: Unremarkable. No pancreatic ductal dilatation or surrounding inflammatory changes. Spleen: Normal in size without focal abnormality. Adrenals/Urinary Tract: Adrenal glands are unremarkable. Kidneys are normal, without renal calculi, focal lesion, or hydronephrosis. Bladder is unremarkable. Stomach/Bowel: Stomach is within normal limits. Appendix appears normal. No evidence of bowel wall thickening, distention, or inflammatory changes. Vascular/Lymphatic: No significant vascular findings are present. No enlarged abdominal or pelvic lymph nodes. Reproductive: There surgical clips in the bilateral adnexa. There is a rounded 2.6 cm cystic area in the left adnexa likely related to the left ovary. The right ovary and uterus are within normal limits. Other: No abdominal wall hernia or abnormality. No abdominopelvic ascites. Musculoskeletal: No acute or significant osseous findings. IMPRESSION: 1. No acute localizing process in the abdomen or pelvis. 2. Adnexal simple-appearing cyst measuring 2.6 cm. No follow-up imaging is recommended. Reference: JACR 2020 Feb;17(2):248-254 Electronically Signed   By: Darliss Cheney M.D.   On: 06/07/2023 01:44    Procedures Procedures    Medications Ordered in ED Medications  ketamine 50 mg in normal saline 5 mL (10 mg/mL) syringe (20 mg Intravenous Not Given 06/07/23 0152)  HYDROmorphone (DILAUDID) injection 1 mg (1 mg Intravenous Given 06/07/23 0109)  ondansetron (ZOFRAN) injection 4 mg (4 mg Intravenous Given 06/07/23 0110)  ketorolac (TORADOL) 15 MG/ML injection 15 mg (15 mg Intravenous Given 06/07/23 0206)  iohexol (OMNIPAQUE) 350 MG/ML injection 75 mL (75 mLs Intravenous Contrast Given 06/07/23 0549)    ED Course/ Medical Decision Making/ A&P                                 Medical Decision Making Amount  and/or Complexity of Data Reviewed Labs: ordered. Radiology: ordered.  Risk Prescription drug management.   Patient with history of tricuspid valve replacement secondary to endocarditis here for evaluation of acute bilateral flank pain that radiates to her abdomen.  She is extremely uncomfortable appearing on evaluation with no focal neurologic deficits.  CT scan for stones was negative for acute abnormality.  CBC with leukocytosis.  Renal function, hepatic function is within normal limits.  UA is not consistent with UTI.  She did not have significant improvement in her symptoms after hydromorphone administration.  After ketorolac she had significant improvement in her symptoms.  Given her history a CTA PE study was obtained as well as a CT abdomen pelvis with contrast.  No evidence of PE, renal abscess or additional complicating feature.  Discussed with patient incidental findings on CT scan of pulmonary nodules, periportal edema.  Her LFTs are within normal limits, suspect the periportal edema is not contributing to today's symptoms.  History, examination is not consistent with epidural abscess.  Discussed with patient unclear source of symptoms.  Feel she is stable for discharge home with outpatient follow-up and return precautions.  Discussed OTC NSAIDs if she has recurrent pain.  Discussed return precautions if she has new or concerning symptoms.  Final Clinical Impression(s) / ED Diagnoses Final diagnoses:  Flank pain  Leukocytosis, unspecified type  Pulmonary nodule    Rx / DC Orders ED Discharge Orders          Ordered    ondansetron (ZOFRAN-ODT) 4 MG disintegrating tablet  Every 8 hours PRN        06/07/23 0659              Tilden Fossa, MD 06/07/23 [phone]

## 2023-06-07 NOTE — ED Notes (Signed)
Patient transported to CT 

## 2023-06-16 ENCOUNTER — Other Ambulatory Visit: Payer: Self-pay

## 2023-06-16 DIAGNOSIS — J302 Other seasonal allergic rhinitis: Secondary | ICD-10-CM

## 2023-06-16 MED ORDER — ALBUTEROL SULFATE HFA 108 (90 BASE) MCG/ACT IN AERS
INHALATION_SPRAY | RESPIRATORY_TRACT | 0 refills | Status: DC
Start: 2023-06-16 — End: 2023-09-22

## 2023-06-23 ENCOUNTER — Other Ambulatory Visit (HOSPITAL_COMMUNITY): Payer: Self-pay

## 2023-06-23 MED ORDER — AMPHETAMINE-DEXTROAMPHET ER 30 MG PO CP24
30.0000 mg | ORAL_CAPSULE | Freq: Two times a day (BID) | ORAL | 0 refills | Status: DC
Start: 2023-06-23 — End: 2023-10-26
  Filled 2023-06-23: qty 60, 30d supply, fill #0

## 2023-06-24 ENCOUNTER — Other Ambulatory Visit: Payer: Self-pay

## 2023-07-16 ENCOUNTER — Ambulatory Visit: Payer: Medicaid Other | Admitting: Student

## 2023-07-16 ENCOUNTER — Other Ambulatory Visit (HOSPITAL_COMMUNITY): Payer: Self-pay

## 2023-07-16 VITALS — BP 116/68 | HR 72 | Wt 147.0 lb

## 2023-07-16 DIAGNOSIS — R634 Abnormal weight loss: Secondary | ICD-10-CM

## 2023-07-16 DIAGNOSIS — R300 Dysuria: Secondary | ICD-10-CM

## 2023-07-16 LAB — POCT URINALYSIS DIP (MANUAL ENTRY)
Glucose, UA: NEGATIVE mg/dL
Ketones, POC UA: NEGATIVE mg/dL
Nitrite, UA: POSITIVE — AB
Protein Ur, POC: >=300 mg/dL — AB
Spec Grav, UA: >=1.03 — AB (ref 1.010–1.025)
Urobilinogen, UA: 0.2 U/dL
pH, UA: 5.5 (ref 5.0–8.0)

## 2023-07-16 LAB — POCT UA - MICROSCOPIC ONLY

## 2023-07-16 MED ORDER — CEFADROXIL 500 MG PO CAPS
500.0000 mg | ORAL_CAPSULE | Freq: Two times a day (BID) | ORAL | 0 refills | Status: AC
Start: 2023-07-16 — End: 2023-07-21
  Filled 2023-07-16: qty 10, 5d supply, fill #0

## 2023-07-16 NOTE — Progress Notes (Signed)
    SUBJECTIVE:   CHIEF COMPLAINT / HPI:   Dysuria Patient presenting with dysuria and dark urine that started yesterday.  She had a prior visit 1 month ago for flank pain.  During the ED visit, CT stone/abdomen/pelvis with contrast fairly unremarkable.  CT did show some lung nodules, likely benign.   Weight loss Patient does have unintentional weight loss, has lost approximately 50 pounds in 1 year.  Recent lab work in ED unremarkable, could consider repeat STD testing.  Of note she states that she is no longer drinking alcohol and has abstained from IV drug use and other illicit drugs.  PERTINENT  PMH / PSH: Endocarditis, bioprosthetic valve, tobacco use disorder  OBJECTIVE:   BP 116/68   Pulse 72   Wt 147 lb (66.7 kg)   LMP 07/09/2023   SpO2 100%   BMI 22.35 kg/m    General: NAD, pleasant HEENT: Normocephalic, atraumatic head. Normal external ear, canal, TM bilaterally. EOM intact and normal conjunctiva BL. Normal external nose. Throat not erythematous, no exudate, no deviation. Normal dentition.  Cardio: RRR, no MRG. Cap Refill <2s. Respiratory: CTAB, normal wob on RA GI: Abdomen is soft, not tender, not distended. BS present Skin: Warm and dry   ASSESSMENT/PLAN:   Assessment & Plan Dysuria UA positive for nitrates, leukocytes, blood.  No CVA tenderness, no fever, no systemic symptoms. - Cefadroxil 500 mg twice daily for 5 days - Return precautions provided Unintentional weight loss Multiple risk factors for weight loss.  Approximately 50 pounds in 1 year.  Did have a leukocytosis last month. - HIV, RPR, hep C, hep B, CBC - Follow-up with PCP   Tiffany Kocher, DO [facility]

## 2023-07-16 NOTE — Patient Instructions (Addendum)
It was great to see you! Thank you for allowing me to participate in your care!   I recommend that you always bring your medications to each appointment as this makes it easy to ensure we are on the correct medications and helps [place] not miss when refills are needed.  Our plans for today:  -Please take cefadroxil 500 mg twice daily for 5 days -Please return to care if you develop fevers, severe back pain, or do not improve after the antibiotic course -Please follow-up with Dr. Manson Passey  We are checking some labs today, I will call you if they are abnormal will send you a MyChart message or a letter if they are normal.  If you do not hear about your labs in the next 2 weeks please let us know.  Take care and seek immediate care sooner if you develop any concerns. Please remember to show up 15 minutes before your scheduled appointment time!  Tiffany Kocher, DO [facility]

## 2023-07-19 LAB — CBC
Hematocrit: 41.8 % (ref 34.0–46.6)
Hemoglobin: 13.7 g/dL (ref 11.1–15.9)
MCH: 31.6 pg (ref 26.6–33.0)
MCHC: 32.8 g/dL (ref 31.5–35.7)
MCV: 97 fL (ref 79–97)
Platelets: 220 10*3/uL (ref 150–450)
RBC: 4.33 x10E6/uL (ref 3.77–5.28)
RDW: 12.2 % (ref 11.7–15.4)
WBC: 11.7 10*3/uL — ABNORMAL HIGH (ref 3.4–10.8)

## 2023-07-19 LAB — HIV ANTIBODY (ROUTINE TESTING W REFLEX): HIV Screen 4th Generation wRfx: NONREACTIVE

## 2023-07-19 LAB — HCV AB W REFLEX TO QUANT PCR: HCV Ab: REACTIVE — AB

## 2023-07-19 LAB — HCV RT-PCR, QUANT (NON-GRAPH): Hepatitis C Quantitation: NOT DETECTED [IU]/mL

## 2023-07-19 LAB — HEPATITIS B SURFACE ANTIGEN: Hepatitis B Surface Ag: NEGATIVE

## 2023-07-19 LAB — RPR: RPR Ser Ql: NONREACTIVE

## 2023-07-20 ENCOUNTER — Telehealth: Payer: Self-pay | Admitting: Family Medicine

## 2023-07-20 DIAGNOSIS — B3731 Acute candidiasis of vulva and vagina: Secondary | ICD-10-CM

## 2023-07-20 MED ORDER — CLOTRIMAZOLE 1 % EX CREA
1.0000 | TOPICAL_CREAM | Freq: Two times a day (BID) | CUTANEOUS | 0 refills | Status: AC
Start: 2023-07-20 — End: 2023-07-27

## 2023-07-20 NOTE — Telephone Encounter (Signed)
Spoke with patient she would like the [employer] [facility] on [address]. Penni Bombard CMA

## 2023-07-20 NOTE — Telephone Encounter (Signed)
Patient walked in requesting Diflucan for yeast infection.  Last OV appt was 07/16/23 prescribed medication. Concerned she will get a yeast infection.

## 2023-07-20 NOTE — Telephone Encounter (Signed)
I can send this in--please call patient and ask pharmacy. The one on file is mail order. Terisa Starr, MD  Family Medicine Teaching Service

## 2023-07-20 NOTE — Addendum Note (Signed)
Addended by: Manson Passey, Yunis Voorheis on: 07/20/2023 07:09 PM   Modules accepted: Orders

## 2023-07-20 NOTE — Telephone Encounter (Signed)
I have sent in an antifungal cream. The oral agent interacts with her Addyi   Terisa Starr, MD  [facility] Medicine Teaching Service

## 2023-07-27 ENCOUNTER — Other Ambulatory Visit (HOSPITAL_COMMUNITY): Payer: Self-pay

## 2023-07-27 DIAGNOSIS — F900 Attention-deficit hyperactivity disorder, predominantly inattentive type: Secondary | ICD-10-CM | POA: Diagnosis not present

## 2023-07-27 MED ORDER — AMPHETAMINE-DEXTROAMPHET ER 30 MG PO CP24
30.0000 mg | ORAL_CAPSULE | Freq: Two times a day (BID) | ORAL | 0 refills | Status: DC
Start: 2023-08-06 — End: 2023-10-26
  Filled 2023-08-20: qty 60, 30d supply, fill #0

## 2023-07-27 MED ORDER — AMPHETAMINE-DEXTROAMPHET ER 30 MG PO CP24
30.0000 mg | ORAL_CAPSULE | Freq: Two times a day (BID) | ORAL | 0 refills | Status: DC
Start: 2023-09-11 — End: 2023-10-26
  Filled 2023-09-21: qty 60, 30d supply, fill #0

## 2023-07-27 MED ORDER — AMPHETAMINE-DEXTROAMPHET ER 30 MG PO CP24
30.0000 mg | ORAL_CAPSULE | Freq: Two times a day (BID) | ORAL | 0 refills | Status: DC
Start: 2023-10-14 — End: 2023-12-09
  Filled 2023-10-29: qty 60, 30d supply, fill #0

## 2023-07-28 ENCOUNTER — Other Ambulatory Visit (HOSPITAL_COMMUNITY): Payer: Self-pay

## 2023-08-07 ENCOUNTER — Ambulatory Visit: Payer: Self-pay | Admitting: Student

## 2023-08-20 ENCOUNTER — Other Ambulatory Visit (HOSPITAL_COMMUNITY): Payer: Self-pay

## 2023-09-21 ENCOUNTER — Other Ambulatory Visit (HOSPITAL_COMMUNITY): Payer: Self-pay

## 2023-09-22 ENCOUNTER — Ambulatory Visit: Payer: Medicaid Other | Admitting: Family Medicine

## 2023-09-22 VITALS — BP 113/75 | HR 60 | Ht 68.0 in | Wt 151.4 lb

## 2023-09-22 DIAGNOSIS — M7989 Other specified soft tissue disorders: Secondary | ICD-10-CM | POA: Diagnosis not present

## 2023-09-22 DIAGNOSIS — J302 Other seasonal allergic rhinitis: Secondary | ICD-10-CM | POA: Diagnosis not present

## 2023-09-22 MED ORDER — ALBUTEROL SULFATE HFA 108 (90 BASE) MCG/ACT IN AERS: INHALATION_SPRAY | RESPIRATORY_TRACT | 0 refills | Status: DC

## 2023-09-22 MED ORDER — TRIAMCINOLONE ACETONIDE 0.5 % EX OINT: 1.0000 | TOPICAL_OINTMENT | Freq: Two times a day (BID) | CUTANEOUS | 2 refills | Status: DC

## 2023-09-22 MED ORDER — TRIAMCINOLONE ACETONIDE 0.025 % EX OINT: 1.0000 | TOPICAL_OINTMENT | Freq: Two times a day (BID) | CUTANEOUS | 0 refills | Status: DC

## 2023-09-22 NOTE — Progress Notes (Signed)
    SUBJECTIVE:   CHIEF COMPLAINT / HPI:   Tiffany Salazar is a 43yo F w/ hx of tricuspid valve replacement for hx of endocarditis that presents for hand swelling - Wakes up with her hands very swollen. Has difficutly closing her fists. Had trouble opening a bottle due to her hands being so swollen.  - Sometimes hands turn blue and purple - Sometimes fingertips will go numb and feel cold - The swelling gets better throughout the day - This has been going on for a few months. - Toes also sometimes turn cold and tingly but not getting swollen or changing colors. - Works as a Engineer, water. Her hands are wet often and is exposed to detergents.  - Denies issues with reflux or heartburn  -Similar issue occurred back in 2019, didn't know what it was but improved with topical steroid   FamHx - Sister has some autoimmune disease, but unsure what it was. (Sister is adopted) - Mom passed,    OBJECTIVE:   BP 113/75   Pulse 60   Ht 5\' 8"  (1.727 m)   Wt 151 lb 6.4 oz (68.7 kg)   LMP 09/21/2023   SpO2 100%   BMI 23.02 kg/m   General: Alert, pleasant nontoxic-appearing woman. NAD. HEENT: NCAT. MMM. CV: RRR, no murmurs.  Resp: CTAB, no wheezing or crackles. Normal WOB on RA.  Abm: Soft, nontender, nondistended. BS present. Ext: No pitting edema Skin: Thickened, lichenified, red rash on BL fingers that stop at MCP joints. Spares thumbs.      ASSESSMENT/PLAN:   Assessment & Plan Finger swelling Leading differential includes irritant dermatitis, scleroderma, Raynaud's.  Irritant dermatitis is supported by patient's occupation where she is exposed to cleaners and sometimes does not wear gloves at work.  Scleroderma and Raynaud's are considered given distribution of rash, sensation of coldness/tingling, change in hand color to purple-bluish.  10/13 creatinine unremarkable.  11/21 CBC notable for leukocytosis, but this was in setting of active infection. -Start triamcinolone 0.5 ointment twice daily for  next 7 days.  Then apply it once daily until rash improves -Apply a thick layer of Vaseline daily -Encouraged to wear gloves and avoid harsh cleansers at work if possible -Check TSH and B12 -Check ANA, scleroderma antibody  Addendum: - Pt left without getting labs drawn. Consider checking labs if rash does not improve with steroid ointment.      Lincoln Brigham, MD [facility]

## 2023-09-22 NOTE — Patient Instructions (Signed)
Good to see you today - Thank you for coming in  Things we discussed today:  1) lets get a few labs today to workup your finger swelling. -Start to apply triamcinolone ointment twice a day on your fingers for the next 7 days.  Then you can apply just once a day until the rash goes away -Keep your hands very moisturized with Vaseline -Try to keep your hands dry and avoid exposing them to harsh cleaners or detergents if possible.  This may be hard because of your job. -Try to wear gloves and prevent your hands from getting too cold.  Excess cold can exacerbate numbness in your fingertips.

## 2023-10-13 ENCOUNTER — Other Ambulatory Visit: Payer: Self-pay | Admitting: Family Medicine

## 2023-10-13 ENCOUNTER — Encounter: Payer: Self-pay | Admitting: Family Medicine

## 2023-10-13 ENCOUNTER — Other Ambulatory Visit (HOSPITAL_COMMUNITY): Payer: Self-pay

## 2023-10-13 DIAGNOSIS — R921 Mammographic calcification found on diagnostic imaging of breast: Secondary | ICD-10-CM

## 2023-10-19 NOTE — Progress Notes (Unsigned)
    SUBJECTIVE:   CHIEF COMPLAINT: breast issues HPI:   Tiffany Salazar is a 43 y.o.  with history notable for endocarditis s/p TVR presenting for follow up.   She reports several concerns.  Smoking Interested in quitting vaping. She is using vapes with nicotine. Has had success with patches before.   Weight loss Reports earlier this year she suddenly dropped ? 40 pounds. Does endorse hot flashes. Has history of  R axillary adenopathy for which she is following up for today. Has upcoming re-imaging of breast lesion. Had negative Pap last year. Reports change in stools this summer but no melena or hematochezia. No polyuria or polydipsia. Does have new hand symptoms (see below). Not sure if there is a family history of CRC.   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 Hands are edematous, painful, and erythematous. Feels she has >30 m of AM stiffness. Also reports some muscle cramps at time. Has had weight loss. Not regularly using prescribed creams.    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   OBJECTIVE:   BP 130/86   Pulse 90   Ht 5\' 8"  (1.727 m)   Wt 155 lb 6.4 oz (70.5 kg)   LMP 09/21/2023   SpO2 100%   BMI 23.63 kg/m   Today's weight:  Last Weight  Most recent update: 10/20/2023  9:11 AM    Weight  70.5 kg (155 lb 6.4 oz)            Review of prior weights: [employer]   10/20/23 0910  Weight: 155 lb 6.4 oz (70.5 kg)   Breast exam Chaperoned exam  Skin changes at site of prior biopsy  No masses or lymphadenopathy   RRR  Lung clear bilaterally Abdomen soft non tender  Hands with erythematous hyperkeratotic/dry/cracked areas    ASSESSMENT/PLAN:   Assessment & Plan Tobacco use disorder Discussed complete cessation of nicotine Rx Chantix and patches She  will follow up with Dr. Raymondo Band  Axillary lymphadenopathy Scheduled for mammogram Given weight loss, pending results, may need to see hematology/oncology  Exam unremarkable today  Unintentional weight change TSH, CBC, CMP, A1C Referral to GI given bowel habit changes, unknown family history  Repeat STI  Dyshidrotic eczema Strongly suspect this is primary cause of hand and finger pain  Rx clobetasol Discussed use, BID vaseline, gloves at night She is avoiding triggers  Stiffness in joint Given degree of AM stiffness will obtain bilateral hand x-rays to look for erosions or signs of inflammatory condition  Finger swelling Previously ordered labs by Dr. Sherrilee Gilles  Need for pneumococcal 20-valent conjugate vaccination Given nicotine use Seasonal allergies Refilled medications  Change in stool See weight loss- referred to GI  Pulmonary nodules Had CT fall 2024 which showed very small nodules This was done after she lost weight? Consider repeat pending trajectory   Terisa Starr, MD  Family Medicine Teaching Service  [facility]

## 2023-10-20 ENCOUNTER — Encounter: Payer: Self-pay | Admitting: Family Medicine

## 2023-10-20 ENCOUNTER — Ambulatory Visit: Payer: Medicaid Other | Admitting: Family Medicine

## 2023-10-20 VITALS — BP 130/86 | HR 90 | Ht 68.0 in | Wt 155.4 lb

## 2023-10-20 DIAGNOSIS — R918 Other nonspecific abnormal finding of lung field: Secondary | ICD-10-CM | POA: Diagnosis not present

## 2023-10-20 DIAGNOSIS — L301 Dyshidrosis [pompholyx]: Secondary | ICD-10-CM | POA: Diagnosis not present

## 2023-10-20 DIAGNOSIS — M2560 Stiffness of unspecified joint, not elsewhere classified: Secondary | ICD-10-CM | POA: Diagnosis not present

## 2023-10-20 DIAGNOSIS — Z23 Encounter for immunization: Secondary | ICD-10-CM | POA: Diagnosis not present

## 2023-10-20 DIAGNOSIS — R195 Other fecal abnormalities: Secondary | ICD-10-CM | POA: Diagnosis not present

## 2023-10-20 DIAGNOSIS — J302 Other seasonal allergic rhinitis: Secondary | ICD-10-CM | POA: Diagnosis not present

## 2023-10-20 DIAGNOSIS — R6889 Other general symptoms and signs: Secondary | ICD-10-CM | POA: Diagnosis not present

## 2023-10-20 DIAGNOSIS — M7989 Other specified soft tissue disorders: Secondary | ICD-10-CM | POA: Diagnosis not present

## 2023-10-20 DIAGNOSIS — F17200 Nicotine dependence, unspecified, uncomplicated: Secondary | ICD-10-CM

## 2023-10-20 DIAGNOSIS — R590 Localized enlarged lymph nodes: Secondary | ICD-10-CM

## 2023-10-20 DIAGNOSIS — R6882 Decreased libido: Secondary | ICD-10-CM | POA: Diagnosis not present

## 2023-10-20 MED ORDER — NICOTINE 21 MG/24HR TD PT24
21.0000 mg | MEDICATED_PATCH | Freq: Every day | TRANSDERMAL | 0 refills | Status: DC
Start: 2023-10-20 — End: 2023-11-19

## 2023-10-20 MED ORDER — CLOBETASOL PROPIONATE 0.05 % EX OINT: 1.0000 | TOPICAL_OINTMENT | Freq: Two times a day (BID) | CUTANEOUS | 2 refills | Status: AC

## 2023-10-20 MED ORDER — VARENICLINE TARTRATE (STARTER) 0.5 MG X 11 & 1 MG X 42 PO TBPK: ORAL_TABLET | ORAL | 0 refills | Status: DC

## 2023-10-20 MED ORDER — ALBUTEROL SULFATE HFA 108 (90 BASE) MCG/ACT IN AERS: INHALATION_SPRAY | RESPIRATORY_TRACT | 3 refills | Status: AC

## 2023-10-20 NOTE — Patient Instructions (Signed)
 It was wonderful to see you today.  Please bring ALL of your medications with you to every visit.   Today we talked about:  -- For your hands  We need Xrays  I sent in Clobetasol  EVERY DAY THIS WEEK TWICE A DAY--apply clobetasol  Then apply vaseline over top in a thick layer  At night wear cotton gloves on [employer]  Starting next week use clobetasol twice a week only   Continue vaseline every day twice a day  Put gloves on top of vaseline at night every night   An x-ray was ordered for you---you do not need an appointment to have this completed.  I recommend going to [facility] [address] [place]  If the results are normal,I will send you a letter  I will call you with results if anything is abnormal    I will call you with blood work or message you  I sent in Patches and Chantix to stop vaping  Follow up in 1 month   Thank you for choosing [facility].   Please call [phone] with any questions about today's appointment.  Please be sure to schedule follow up at the front  desk before you leave today.   Terisa Starr, MD  Family Medicine

## 2023-10-20 NOTE — Assessment & Plan Note (Addendum)
 Had CT fall 2024 which showed very small nodules This was done after she lost weight? Consider repeat pending trajectory

## 2023-10-20 NOTE — Assessment & Plan Note (Signed)
 Strongly suspect this is primary cause of hand and finger pain  Rx clobetasol Discussed use, BID vaseline, gloves at night She is avoiding triggers

## 2023-10-20 NOTE — Assessment & Plan Note (Signed)
 Discussed complete cessation of nicotine Rx Chantix and patches She will follow up with Dr. Raymondo Band

## 2023-10-21 ENCOUNTER — Encounter: Payer: Self-pay | Admitting: Family Medicine

## 2023-10-21 LAB — COMPREHENSIVE METABOLIC PANEL
ALT: 17 [IU]/L (ref 0–32)
AST: 26 [IU]/L (ref 0–40)
Albumin: 4.4 g/dL (ref 3.9–4.9)
Alkaline Phosphatase: 84 [IU]/L (ref 44–121)
BUN/Creatinine Ratio: 17 (ref 9–23)
BUN: 16 mg/dL (ref 6–24)
Bilirubin Total: 0.5 mg/dL (ref 0.0–1.2)
CO2: 24 mmol/L (ref 20–29)
Calcium: 9.5 mg/dL (ref 8.7–10.2)
Chloride: 101 mmol/L (ref 96–106)
Creatinine, Ser: 0.95 mg/dL (ref 0.57–1.00)
Globulin, Total: 2.6 g/dL (ref 1.5–4.5)
Glucose: 104 mg/dL — ABNORMAL HIGH (ref 70–99)
Potassium: 5 mmol/L (ref 3.5–5.2)
Sodium: 139 mmol/L (ref 134–144)
Total Protein: 7 g/dL (ref 6.0–8.5)
eGFR: 78 mL/min/{1.73_m2} (ref >59)

## 2023-10-21 LAB — CBC
Hematocrit: 40.5 % (ref 34.0–46.6)
Hemoglobin: 13.6 g/dL (ref 11.1–15.9)
MCH: 32 pg (ref 26.6–33.0)
MCHC: 33.6 g/dL (ref 31.5–35.7)
MCV: 95 fL (ref 79–97)
Platelets: 205 10*3/uL (ref 150–450)
RBC: 4.25 x10E6/uL (ref 3.77–5.28)
RDW: 11.9 % (ref 11.7–15.4)
WBC: 8.5 10*3/uL (ref 3.4–10.8)

## 2023-10-21 LAB — HEMOGLOBIN A1C
Est. average glucose Bld gHb Est-mCnc: 128 mg/dL
Hgb A1c MFr Bld: 6.1 % — ABNORMAL HIGH (ref 4.8–5.6)

## 2023-10-21 LAB — VITAMIN B12: Vitamin B-12: 479 pg/mL (ref 232–1245)

## 2023-10-21 LAB — LIPID PANEL
Chol/HDL Ratio: 2 {ratio} (ref 0.0–4.4)
Cholesterol, Total: 162 mg/dL (ref 100–199)
HDL: 81 mg/dL (ref >39)
LDL Chol Calc (NIH): 64 mg/dL (ref 0–99)
Triglycerides: 92 mg/dL (ref 0–149)
VLDL Cholesterol Cal: 17 mg/dL (ref 5–40)

## 2023-10-21 LAB — ANTI-SCLERODERMA ANTIBODY: Scleroderma (Scl-70) (ENA) Antibody, IgG: <0.2 AI (ref 0.0–0.9)

## 2023-10-21 LAB — ANA: Anti Nuclear Antibody (ANA): NEGATIVE

## 2023-10-21 LAB — TSH RFX ON ABNORMAL TO FREE T4: TSH: 4.07 u[IU]/mL (ref 0.450–4.500)

## 2023-10-26 ENCOUNTER — Encounter: Payer: Self-pay | Admitting: Podiatry

## 2023-10-26 ENCOUNTER — Other Ambulatory Visit (HOSPITAL_COMMUNITY): Payer: Self-pay

## 2023-10-26 ENCOUNTER — Ambulatory Visit: Payer: Medicaid Other | Admitting: Podiatry

## 2023-10-26 ENCOUNTER — Telehealth: Payer: Self-pay

## 2023-10-26 ENCOUNTER — Ambulatory Visit (INDEPENDENT_AMBULATORY_CARE_PROVIDER_SITE_OTHER)

## 2023-10-26 DIAGNOSIS — M7751 Other enthesopathy of right foot: Secondary | ICD-10-CM

## 2023-10-26 DIAGNOSIS — M7752 Other enthesopathy of left foot: Secondary | ICD-10-CM | POA: Diagnosis not present

## 2023-10-26 NOTE — Telephone Encounter (Signed)
 Pharmacy Patient Advocate Encounter   Received notification from CoverMyMeds that prior authorization for NICOTINE PATCHES is required/requested.   Insurance verification completed.   The patient is insured through [facility] .    THE FOLLOWING are preferred by the insurance.  If suggested medication is appropriate, Please send in a new RX and discontinue this one. If not, please advise as to why it's not appropriate so that we may request a Prior Authorization. Please note, some preferred medications may still require a PA.  If the suggested medications have not been trialed and there are no contraindications to their use, the PA will not be submitted, as it will not be approved.

## 2023-10-26 NOTE — Progress Notes (Signed)
 Chief Complaint  Patient presents with   Toe Pain    5th toe bilateral (R>L) previous DOS 02/08/2020 - LEFT, DOS 08/29/20 and 12/30/20 - RIGHT - she is having trouble with little toes sticking up and rubbing her shoes, she is limited to types of shoes she can wear that won't cause pain   New Patient (Initial Visit)    Est pt 04/2021    HPI: 43 y.o. female presenting today for evaluation of pain and tenderness associated to the fifth digits bilateral.  Patient has a history of bilateral foot surgery performed by Dr. Samuella Cota who is no longer with our office.  After surgery she has noticed that her fifth toes are dorsiflexed and stick up essentially rubbing in all of her shoes.  It is very painful despite trying different shoes.  She is very frustrated presents for further treatment and evaluation  Past Medical History:  Diagnosis Date   AMA (advanced maternal age) multigravida 35+, unspecified trimester 09/20/2018   Anemia    Asthma    seasonal   Eczema of both hands 06/03/2021   Hepatitis C    Hep C   MRSA infection 03/21/2015   Pregnancy complicated by subutex maintenance, antepartum (HCC) 10/20/2018   Shortness of breath 04/29/2023   Subdural hematoma (HCC) 12/26/2013   Substance use disorder    Tricuspid valve replaced    Unwanted fertility 02/09/2019   Signed BTL papers    Past Surgical History:  Procedure Laterality Date   BREAST BIOPSY Right 05/22/2023   MM RT BREAST BX W LOC DEV EA AD LESION IMG BX SPEC STEREO GUIDE 05/22/2023 [facility]   BREAST BIOPSY Right 05/22/2023   MM RT BREAST BX W LOC DEV 1ST LESION IMAGE BX SPEC STEREO GUIDE 05/22/2023 [facility]   BUNIONECTOMY Bilateral    BUNIONECTOMY Right 01/09/2021   Procedure: TAILOR's BUNIONECTOMY RIGHT;  Surgeon: Park Liter, DPM;  Location: [facility];  Service: Podiatry;  Laterality: Right;   CARDIAC SURGERY     CESAREAN SECTION  x 3   CESAREAN SECTION WITH BILATERAL TUBAL LIGATION Bilateral 03/29/2019    Procedure: CESAREAN SECTION WITH BILATERAL TUBAL LIGATION;  Surgeon: Allie Bossier, MD;  Location: [facility];  Service: Obstetrics;  Laterality: Bilateral;   TEE WITHOUT CARDIOVERSION N/A 03/23/2018   Procedure: TRANSESOPHAGEAL ECHOCARDIOGRAM (TEE);  Surgeon: Chrystie Nose, MD;  Location: [facility] ENDOSCOPY;  Service: Cardiovascular;  Laterality: N/A;   TEE WITHOUT CARDIOVERSION N/A 03/29/2018   Procedure: TRANSESOPHAGEAL ECHOCARDIOGRAM (TEE);  Surgeon: Alleen Borne, MD;  Location: [facility] OR;  Service: Open Heart Surgery;  Laterality: N/A;   TRICUSPID VALVE REPLACEMENT N/A 03/29/2018   Procedure: TRICUSPID VALVE REPLACEMENT Using 27 mm Magna Ease Mitral Valve Pericardial Bioprosthesis;  Surgeon: Alleen Borne, MD;  Location: [facility] OR;  Service: Open Heart Surgery;  Laterality: N/A;   TUBAL LIGATION      Allergies  Allergen Reactions   Gentamicin Rash    Red man syndrome    Vancomycin Rash    Red man syndrome     Physical Exam: General: The patient is alert and oriented x3 in no acute distress.  Dermatology: Skin is warm, dry and supple bilateral lower extremities.   Vascular: Palpable pedal pulses bilaterally. Capillary refill within normal limits.  No appreciable edema.  No erythema.  Neurological: Grossly intact via light touch  Musculoskeletal Exam: The fifth digits are extended and dorsiflexed beyond the remaining adjacent digits.  They are essentially dorsiflexed in a  position where they rub in all of her close toed shoes.  There is some limited range of motion of the fifth MTP as well.  Overcompensation of the extensor tendon also noted  Radiographic Exam B/L feet 10/26/2023:  Prior history of bunion surgery to the first metatarsal bilateral with intact orthopedic screw.  Prior history of tailor's bunionectomy with fifth metatarsal osteotomy.  The orthopedic screw to the right fifth metatarsal is absent due to subsequent removal.  Assessment/Plan of Care: 1.  Tendon capsular imbalance  fifth digits bilateral 2.  Elevated fifth toes bilateral  -Patient evaluated.  X-rays reviewed -Unfortunately the fifth toes are dorsiflexed and extended and cause irritation in any shoe gear.  She is very frustrated because they are symptomatic in most shoes. -I do believe the patient would benefit from extensor tenotomy's to the fifth digits bilateral as well as MTP capsulotomies.  This was discussed in detail with the patient.  Risk benefits advantages disadvantages the procedure were explained.  No guarantees were expressed or implied. -After discussion with the patient she would like to proceed with surgery. -Authorization for surgery was initiated today.  Surgery will consist of extensor tenotomy with MTP capsulotomy fifth digit bilateral -Return to clinic 1 week postop       Felecia Shelling, DPM [facility]  Dr. Felecia Shelling, DPM    [address] [place], [place] [address]                Office [phone]  Fax [phone]

## 2023-10-27 ENCOUNTER — Telehealth: Payer: Self-pay

## 2023-10-27 DIAGNOSIS — M9905 Segmental and somatic dysfunction of pelvic region: Secondary | ICD-10-CM | POA: Diagnosis not present

## 2023-10-27 DIAGNOSIS — M5386 Other specified dorsopathies, lumbar region: Secondary | ICD-10-CM | POA: Diagnosis not present

## 2023-10-27 DIAGNOSIS — M9904 Segmental and somatic dysfunction of sacral region: Secondary | ICD-10-CM | POA: Diagnosis not present

## 2023-10-27 DIAGNOSIS — M9903 Segmental and somatic dysfunction of lumbar region: Secondary | ICD-10-CM | POA: Diagnosis not present

## 2023-10-27 NOTE — Telephone Encounter (Signed)
 Received surgery paperwork from Dr. Logan Bores. Left a message for [place] to call and schedule surgery.

## 2023-10-27 NOTE — Telephone Encounter (Signed)
 Completed [facility] Rx for [facility].  Please call patient and let her know Reviewed, completed, and signed form.  Note routed to RN team inbasket and placed completed form in RN Wall pocket in the front office.  Westley Chandler, MD

## 2023-10-27 NOTE — Telephone Encounter (Signed)
 Updated demographics and placed in fax pile.   Copy made for batch scanning as well.

## 2023-10-28 ENCOUNTER — Other Ambulatory Visit (HOSPITAL_COMMUNITY): Payer: Self-pay

## 2023-10-28 MED ORDER — NICOTINE 21 MG/24HR TD PT24
21.0000 mg | MEDICATED_PATCH | Freq: Every morning | TRANSDERMAL | 3 refills | Status: DC
  Filled 2023-10-28: qty 14, 14d supply, fill #0
  Filled 2023-11-07: qty 14, 14d supply, fill #1

## 2023-10-29 ENCOUNTER — Other Ambulatory Visit (HOSPITAL_COMMUNITY): Payer: Self-pay

## 2023-10-30 ENCOUNTER — Other Ambulatory Visit (HOSPITAL_COMMUNITY): Payer: Self-pay

## 2023-11-02 ENCOUNTER — Ambulatory Visit
Admission: RE | Admit: 2023-11-02 | Discharge: 2023-11-02 | Disposition: A | Discharge status: Home / Self Care | Source: Ambulatory Visit | Attending: Family Medicine

## 2023-11-02 DIAGNOSIS — M79642 Pain in left hand: Secondary | ICD-10-CM | POA: Diagnosis not present

## 2023-11-02 DIAGNOSIS — M2560 Stiffness of unspecified joint, not elsewhere classified: Secondary | ICD-10-CM

## 2023-11-02 DIAGNOSIS — S60551A Superficial foreign body of right hand, initial encounter: Secondary | ICD-10-CM | POA: Diagnosis not present

## 2023-11-02 NOTE — Telephone Encounter (Signed)
 Please see below regarding nicotine patches PA requirements.

## 2023-11-03 ENCOUNTER — Telehealth: Payer: Self-pay | Admitting: Family Medicine

## 2023-11-03 ENCOUNTER — Other Ambulatory Visit (HOSPITAL_COMMUNITY): Payer: Self-pay

## 2023-11-03 DIAGNOSIS — S60551S Superficial foreign body of right hand, sequela: Secondary | ICD-10-CM

## 2023-11-03 NOTE — Telephone Encounter (Signed)
 Called with results. 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

## 2023-11-11 ENCOUNTER — Telehealth: Admitting: Student

## 2023-11-11 ENCOUNTER — Telehealth: Payer: Self-pay | Admitting: Cardiovascular Disease

## 2023-11-11 DIAGNOSIS — R051 Acute cough: Secondary | ICD-10-CM | POA: Diagnosis not present

## 2023-11-11 NOTE — Telephone Encounter (Signed)
 Called patient regarding concern for chest pain and weakness.   Family members recently diagnosed with COVID. She has started running fever today, tmax 101. She has taken ibuprofen that has reduced fever and has helped with discomfort in chest.   She also reports body aches and congestion. Denies shortness of breath at this time, speaking in complete sentences. Pain does not radiate.   Patient has history of tricuspid valve replacement. Advised patient that she should also contact cardiologist regarding this concern.   She has a virtual visit scheduled for this morning to further discuss.   ED precautions discussed in the meantime.   Tiffany Prude, RN

## 2023-11-11 NOTE — Telephone Encounter (Signed)
 Pt c/o swelling/edema: STAT if pt has developed SOB within 24 hours  If swelling, where is the swelling located? hands  How much weight have you gained and in what time span? o  Have you gained 2 pounds in a day or 5 pounds in a week? o  Do you have a log of your daily weights (if so, list)? no  Are you currently taking a fluid pill? no  Are you currently SOB? no  Have you traveled recently in a car or plane for an extended period of time? no

## 2023-11-11 NOTE — Progress Notes (Signed)
 [facility] Telemedicine Visit  Patient consented to have virtual visit and was identified by name and date of birth. Method of visit: Telephone  Encounter participants: Patient: Tiffany Salazar - located at home  Provider: Glendale Chard - located at [facility]  Chief Complaint: URI  HPI:  Started having headache, watery eyes, cough and fatigue yesterday.  Son and husband just tested positive for COVID.  She has taken some DayQuil Tylenol and ibuprofen this morning and has had improvement of her symptoms.  She is feeling much better.  She is well hydrated.  ROS: per HPI  Pertinent PMHx: History of endocarditis and tricuspid valve replacement  Exam:  There were no vitals taken for this visit.  Respiratory: Speaking in full sentences, not short of breath, conversing comfortably  Assessment/Plan:  Cough: Most likely viral mediated with known sick contacts in the home.  Discussed symptomatic relief and caution with combining over-the-counter cough medicines with Tylenol and ibuprofen.  Patient to call back or return to the office for difficulty breathing, persistent fever over 10 days.  Time spent during visit with patient: 20 minutes

## 2023-11-11 NOTE — Telephone Encounter (Addendum)
 Spoke with patient regarding her hands swelling and pain  The swelling/pain is first thing in the morning and improves throughout the day  Does have some tingling in the tips of her toes and fingers at time.  Stated they also turn red/purple at times  Fingers actually get frozen in the "C" position at times.  Did get work up by her PCP with extensive lab work, has been referred for further work-up Has had some chest discomfort and changes in breathing but currently has COVID  Patient concerned coming from her valve  Did reassure her doesn't sound  like coming from valve but would forward to Dr Duke Salvia for review.   Will call patient if she feels anything cardiac concerning

## 2023-11-15 NOTE — Progress Notes (Unsigned)
    SUBJECTIVE:   CHIEF COMPLAINT:weight loss  HPI:   Tiffany Salazar is a 43 y.o.  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 Only drinks sodas during daytime, not much food. Feels her appetite is less on Chantix. Has not yet heard from GI. No night sweats, lymphadenopathy, fevers, new habits, changes in medications. Also takes Adderall, no change in dose. Did have a 'tummy tuck' about 2 years ago.   Insect bites Stayed in hotel over weekend. Slightly painful lesions on R fifth digit and L browline. No others have similar symptoms.  Reports she is cutting down on vape and smoking. Taking chantix, no quit date yet.  Dyshydrotic eczema slowly improving.  Last Weight  Most recent update: 11/16/2023  8:54 AM    Weight  68.5 kg (151 lb)             PERTINENT  PMH / PSH/Family/Social History : Hep C s/p SVR, endocarditis   OBJECTIVE:   BP 92/62   Pulse 80   Ht 5\' 8"  (1.727 m)   Wt 151 lb (68.5 kg)   SpO2 96%   BMI 22.96 kg/m   Today's weight:  Last Weight  Most recent update: 11/16/2023  8:54 AM    Weight  68.5 kg (151 lb)            Review of prior weights: [employer]   11/16/23 0852  Weight: 151 lb (68.5 kg)    RRR Lungs clear Bilateral hands with erythema and some overlying flaking On R fifth digit Lateral aspect 0.3 cm erythematous papules Similar lesion on R brow  ASSESSMENT/PLAN:   Assessment & Plan Unintentional weight loss Differential remains broad Lab evaluation reassuring HCV viral load and STI testing today UTD on Pap Mammogram scheduled Colonoscopy- she will call, may need EGD as well  If continues to lose weight OR has lymphadenopathy on this years mammogram will refer to Heme/Onc for consideration of PET  Dyshidrotic eczema Recommend vaseline or similar on top of clobetasol  Tobacco use Rx chantix  continuation pack Discussed ways to quit Follow up 1 month  Insect bite, unspecified site, initial encounter RX kenalog for brow Clobetasol for hands  Discussed washing clothing at home    Terisa Starr, MD  Family Medicine Teaching Service  [facility]

## 2023-11-16 ENCOUNTER — Encounter: Payer: Self-pay | Admitting: Family Medicine

## 2023-11-16 ENCOUNTER — Ambulatory Visit: Payer: Medicaid Other | Admitting: Family Medicine

## 2023-11-16 VITALS — BP 92/62 | HR 80 | Ht 68.0 in | Wt 151.0 lb

## 2023-11-16 DIAGNOSIS — W57XXXA Bitten or stung by nonvenomous insect and other nonvenomous arthropods, initial encounter: Secondary | ICD-10-CM | POA: Diagnosis not present

## 2023-11-16 DIAGNOSIS — Z720 Tobacco use: Secondary | ICD-10-CM | POA: Diagnosis not present

## 2023-11-16 DIAGNOSIS — R634 Abnormal weight loss: Secondary | ICD-10-CM | POA: Diagnosis not present

## 2023-11-16 DIAGNOSIS — L301 Dyshidrosis [pompholyx]: Secondary | ICD-10-CM | POA: Diagnosis not present

## 2023-11-16 MED ORDER — TRIAMCINOLONE ACETONIDE 0.025 % EX OINT: 1.0000 | TOPICAL_OINTMENT | Freq: Two times a day (BID) | CUTANEOUS | 0 refills | Status: DC

## 2023-11-16 MED ORDER — VARENICLINE TARTRATE 1 MG PO TABS: 1.0000 mg | ORAL_TABLET | Freq: Two times a day (BID) | ORAL | 1 refills | Status: AC

## 2023-11-16 NOTE — Patient Instructions (Signed)
 It was wonderful to see you today.  Please bring ALL of your medications with you to every visit.   Today we talked about: I think you have two insect bites  Use clobetasol daily on your RIGHT hand  Use Kenalog once a day for 1 week on your LEFT eyebrow   A hand surgeon will call you  Please call the [facility] [address] [place] [phone] They will call pt to schedule appointment.  Please follow up in 1 months if you are still losing weight we will have you see Hematology/Oncology   Thank you for choosing [facility].   Please call [phone] with any questions about today's appointment.  Please be sure to schedule follow up at the front  desk before you leave today.   Terisa Starr, MD  Family Medicine

## 2023-11-16 NOTE — Assessment & Plan Note (Signed)
 Rx chantix continuation pack Discussed ways to quit Follow up 1 month

## 2023-11-17 LAB — HIV ANTIBODY (ROUTINE TESTING W REFLEX): HIV Screen 4th Generation wRfx: NONREACTIVE

## 2023-11-17 LAB — HEPATITIS B SURFACE ANTIGEN: Hepatitis B Surface Ag: NEGATIVE

## 2023-11-17 LAB — HCV RNA QUANT: Hepatitis C Quantitation: NOT DETECTED [IU]/mL

## 2023-11-17 LAB — RPR: RPR Ser Ql: NONREACTIVE

## 2023-11-18 ENCOUNTER — Encounter: Payer: Self-pay | Admitting: Family Medicine

## 2023-11-18 ENCOUNTER — Encounter: Payer: Self-pay | Admitting: Gastroenterology

## 2023-11-18 ENCOUNTER — Ambulatory Visit: Admitting: Family Medicine

## 2023-11-18 VITALS — BP 134/51 | HR 60 | Ht 68.0 in | Wt 152.2 lb

## 2023-11-18 DIAGNOSIS — L089 Local infection of the skin and subcutaneous tissue, unspecified: Secondary | ICD-10-CM

## 2023-11-18 MED ORDER — CEPHALEXIN 500 MG PO CAPS: 500.0000 mg | ORAL_CAPSULE | Freq: Three times a day (TID) | ORAL | 0 refills | Status: DC

## 2023-11-18 NOTE — Progress Notes (Signed)
    SUBJECTIVE:   CHIEF COMPLAINT / HPI:   TG is a 43yo F w/ hx of dyshidrotic eczema, Tricuspid valve replacement for history of endocarditis that presents for worsening bump on right pinky finger. - went to [employer] last week, staying in a hotel. Woke up out of sleep on Friday night from a bite sensation on her R hand. Felt like a bug bite. - Then Saturday night, she got another bite while sleeping, this time on her L eyelid. -Both bite marks have been tender.  Denies itchiness. -The bump on her right hand 5th finger has been slowly getting bigger and more painful. There is surrounding redness that is spreading.  - Bump on L eye is improving and shrinking.  - No fevers - She had leftover antibiotics from a prior infection, and took some of that last night.  She is unsure what the antibiotic is.  OBJECTIVE:   BP (!) 134/51   Pulse 60   Ht 5\' 8"  (1.727 m)   Wt 152 lb 3.2 oz (69 kg)   SpO2 98%   BMI 23.14 kg/m   General: Alert, pleasant well-appearing woman. NAD. HEENT: NCAT. MMM. CV: RRR, no murmurs.  Resp: CTAB, no wheezing or crackles. Normal WOB on RA.  Ext: Moves all ext spontaneously. Normal ROM on all fingers. No finger joint effusion Skin:  ~0.5cm raised, tender papule on top of ~4cm diameter of surrounding erythema. Also raised erythematous papule on L upper eyelid, minimal tenderness to palpation and no surrounding erythema.        ASSESSMENT/PLAN:   Assessment & Plan Skin infection Lesion on R 5th finger c/f cellulitis w/ potential abscess formation, in setting of preceding bug bite. Septic arthritis is considered but less likely given lack of systemic symptoms and normal range of motion of fingers. Lesion on L eye appears to be a bug bite without superimposed infection, and is self-resolving.  - Start Keflex 300mg  TID for 5 days -Tylenol or ibuprofen as needed for fever and pain -Return precautions discussed - Traced area of redness with sharpie (currently  ~4cm diameter). Advised to take daily photos of the rash to track its size.  - F/u in 1 week if not improving     Lincoln Brigham, MD [facility]

## 2023-11-18 NOTE — Patient Instructions (Signed)
 Good to see you today - Thank you for coming in  Things we discussed today:  1) You look like you have a skin infection of your right hand after a bug bite. -Take Keflex 500 mg 3 times a day for 5 days.  Even if you start feeling better, make sure you take the full 5 days. -You can take Tylenol or ibuprofen as needed for pain or discomfort -Applying ice to the area can help decrease the inflammation and swelling -Take a picture of the area every day so we can keep track of if it is growing or shrinking. -Come back in about 1 week to make sure the infection is improving. -Come back sooner if you are experiencing increasing pain in your hand or feel like the infection is spreading.

## 2023-11-19 ENCOUNTER — Observation Stay (HOSPITAL_COMMUNITY)
Admission: AD | Admit: 2023-11-19 | Discharge: 2023-11-20 | Disposition: A | Discharge status: Home / Self Care | Source: Ambulatory Visit | Attending: Family Medicine | Admitting: Family Medicine

## 2023-11-19 ENCOUNTER — Encounter (HOSPITAL_COMMUNITY): Payer: Self-pay | Admitting: Family Medicine

## 2023-11-19 ENCOUNTER — Other Ambulatory Visit (HOSPITAL_COMMUNITY): Payer: Self-pay

## 2023-11-19 ENCOUNTER — Other Ambulatory Visit: Payer: Self-pay

## 2023-11-19 ENCOUNTER — Other Ambulatory Visit: Payer: Self-pay | Admitting: Student

## 2023-11-19 ENCOUNTER — Encounter (HOSPITAL_COMMUNITY): Payer: Self-pay

## 2023-11-19 ENCOUNTER — Ambulatory Visit: Admitting: Student

## 2023-11-19 VITALS — BP 108/76 | HR 59 | Ht 68.0 in | Wt 152.6 lb

## 2023-11-19 DIAGNOSIS — L03113 Cellulitis of right upper limb: Principal | ICD-10-CM | POA: Insufficient documentation

## 2023-11-19 DIAGNOSIS — M79641 Pain in right hand: Secondary | ICD-10-CM

## 2023-11-19 DIAGNOSIS — L03119 Cellulitis of unspecified part of limb: Principal | ICD-10-CM | POA: Diagnosis present

## 2023-11-19 DIAGNOSIS — J45909 Unspecified asthma, uncomplicated: Secondary | ICD-10-CM | POA: Diagnosis not present

## 2023-11-19 DIAGNOSIS — Z789 Other specified health status: Secondary | ICD-10-CM

## 2023-11-19 DIAGNOSIS — Z79899 Other long term (current) drug therapy: Secondary | ICD-10-CM | POA: Diagnosis not present

## 2023-11-19 DIAGNOSIS — F17210 Nicotine dependence, cigarettes, uncomplicated: Secondary | ICD-10-CM | POA: Diagnosis not present

## 2023-11-19 LAB — CBC WITH DIFFERENTIAL/PLATELET
Abs Immature Granulocytes: 0.03 10*3/uL (ref 0.00–0.07)
Basophils Absolute: 0.1 10*3/uL (ref 0.0–0.1)
Basophils Relative: 1 %
Eosinophils Absolute: 0.2 10*3/uL (ref 0.0–0.5)
Eosinophils Relative: 2 %
HCT: 38.8 % (ref 36.0–46.0)
Hemoglobin: 12.6 g/dL (ref 12.0–15.0)
Immature Granulocytes: 0 %
Lymphocytes Relative: 37 %
Lymphs Abs: 3.7 10*3/uL (ref 0.7–4.0)
MCH: 31.6 pg (ref 26.0–34.0)
MCHC: 32.5 g/dL (ref 30.0–36.0)
MCV: 97.2 fL (ref 80.0–100.0)
Monocytes Absolute: 0.6 10*3/uL (ref 0.1–1.0)
Monocytes Relative: 6 %
Neutro Abs: 5.5 10*3/uL (ref 1.7–7.7)
Neutrophils Relative %: 54 %
Platelets: 216 10*3/uL (ref 150–400)
RBC: 3.99 MIL/uL (ref 3.87–5.11)
RDW: 13.2 % (ref 11.5–15.5)
WBC: 10.1 10*3/uL (ref 4.0–10.5)
nRBC: 0 % (ref 0.0–0.2)

## 2023-11-19 LAB — COMPREHENSIVE METABOLIC PANEL WITH GFR
ALT: 14 U/L (ref 0–44)
AST: 20 U/L (ref 15–41)
Albumin: 3.3 g/dL — ABNORMAL LOW (ref 3.5–5.0)
Alkaline Phosphatase: 46 U/L (ref 38–126)
Anion gap: 9 (ref 5–15)
BUN: 21 mg/dL — ABNORMAL HIGH (ref 6–20)
CO2: 25 mmol/L (ref 22–32)
Calcium: 8.5 mg/dL — ABNORMAL LOW (ref 8.9–10.3)
Chloride: 105 mmol/L (ref 98–111)
Creatinine, Ser: 0.98 mg/dL (ref 0.44–1.00)
GFR, Estimated: >60 mL/min (ref >60)
Glucose, Bld: 84 mg/dL (ref 70–99)
Potassium: 4.2 mmol/L (ref 3.5–5.1)
Sodium: 139 mmol/L (ref 135–145)
Total Bilirubin: 0.6 mg/dL (ref 0.0–1.2)
Total Protein: 5.8 g/dL — ABNORMAL LOW (ref 6.5–8.1)

## 2023-11-19 MED ORDER — OXYCODONE HCL 5 MG PO TABS: 2.5000 mg | ORAL_TABLET | Freq: Four times a day (QID) | ORAL | Status: DC | PRN

## 2023-11-19 MED ORDER — BUPRENORPHINE HCL 8 MG SL SUBL
8.0000 mg | SUBLINGUAL_TABLET | Freq: Every morning | SUBLINGUAL | Status: DC
  Administered 2023-11-20: 8 mg via SUBLINGUAL
  Filled 2023-11-19: qty 1

## 2023-11-19 MED ORDER — OXYCODONE HCL 5 MG PO TABS
2.5000 mg | ORAL_TABLET | Freq: Four times a day (QID) | ORAL | Status: DC | PRN
Start: 2023-11-19 — End: 2023-11-19

## 2023-11-19 MED ORDER — VARENICLINE TARTRATE 1 MG PO TABS
1.0000 mg | ORAL_TABLET | Freq: Two times a day (BID) | ORAL | Status: DC
  Administered 2023-11-19 – 2023-11-20 (×2): 1 mg via ORAL
  Filled 2023-11-19 (×3): qty 1

## 2023-11-19 MED ORDER — SODIUM CHLORIDE 0.9 % IV SOLN
2.0000 g | Freq: Three times a day (TID) | INTRAVENOUS | Status: DC
  Administered 2023-11-20 (×2): 2 g via INTRAVENOUS
  Filled 2023-11-19 (×2): qty 12.5

## 2023-11-19 MED ORDER — AMPHETAMINE-DEXTROAMPHET ER 5 MG PO CP24
30.0000 mg | ORAL_CAPSULE | Freq: Every morning | ORAL | Status: DC
  Administered 2023-11-20: 30 mg via ORAL
  Filled 2023-11-19: qty 6

## 2023-11-19 MED ORDER — SODIUM CHLORIDE 0.9 % IV SOLN
2.0000 g | INTRAVENOUS | Status: AC
  Administered 2023-11-19: 2 g via INTRAVENOUS
  Filled 2023-11-19: qty 12.5

## 2023-11-19 MED ORDER — ACETAMINOPHEN 325 MG PO TABS
650.0000 mg | ORAL_TABLET | Freq: Four times a day (QID) | ORAL | Status: DC
  Administered 2023-11-19 – 2023-11-20 (×4): 650 mg via ORAL
  Filled 2023-11-19 (×4): qty 2

## 2023-11-19 MED ORDER — IBUPROFEN 200 MG PO TABS
600.0000 mg | ORAL_TABLET | Freq: Four times a day (QID) | ORAL | Status: DC | PRN
  Administered 2023-11-19: 600 mg via ORAL
  Filled 2023-11-19: qty 3

## 2023-11-19 MED ORDER — ENOXAPARIN SODIUM 40 MG/0.4ML IJ SOSY: 40.0000 mg | PREFILLED_SYRINGE | INTRAMUSCULAR | Status: DC

## 2023-11-19 MED ORDER — SODIUM CHLORIDE 0.9 % IV SOLN
4.0000 mg/kg | Freq: Every day | INTRAVENOUS | Status: DC
  Administered 2023-11-19: 300 mg via INTRAVENOUS
  Filled 2023-11-19 (×2): qty 6

## 2023-11-19 NOTE — H&P (Addendum)
 Hospital Admission History and Physical Service Pager: [phone]  Patient name: Tiffany Salazar Medical record number: [id number] Date of Birth: [birth year] Age: 43 y.o. Gender: female  Primary Care Provider: Westley Chandler, MD Consultants: Hand surgery Code Status: Full which was confirmed with patient Preferred Emergency Contact:  Contact Information     Name Relation Home Work Mobile   Bunting,Keith Significant other   [phone]      Other Contacts   None on File     Chief Complaint: Cellulitis of R hand  Assessment and Plan: Tiffany Salazar is a 43 y.o. female presenting with worsening erythema, pain to R fifth digit, suspected cellulitis from insect bite failing outpatient antibiotic therapy.  Other differentials include osteomyelitis and septic arthritis.  Reassuringly patient still has some mobility of her fifth digit however range of motion is limited secondary to pain.  Holding off on MRI at this time due to possible foreign body at base of fifth metacarpal noted in x-ray from 3/10.  Hand surgery consulted, IV antibiotics initiated. Assessment & Plan Cellulitis of hand Cellulitis of right fifth digit failed outpatient antibiotic therapy. 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  Suspected to be in the setting of insect bite however patient did have x-ray of right hand this month showing possible foreign body at base of fifth metacarpal- this appears to be unrelated given location of infection. Will hold off on MRI at this time given FB may be metal. Hx of redman's and pruritic rash with Vanc + Gentamicin. - Admitted to FMTS, Dr. Jennette Kettle attending - MedSurg - Hand surgery consulted, appreciate recommendations - IV cefepime, daptomycin - Ibuprofen 600 mg every 6 hours as needed for pain - Lovenox for VTE prophylaxis - Blood cultures pending - Close monitoring to ensure patient does not develop compartment syndrome - AM CBC, CMP  Chronic and  Stable Problems:  Substance use disorder-previously IVDU (not since 2019). Continue Buprenorphine 8mg  daily ADHD-continue Adderall 30 mg daily  FEN/GI: Regular diet VTE Prophylaxis: Lovenox  Disposition: Med-surg  History of Present Illness:  Tiffany Salazar is a 43 y.o. female presenting with cellulitis of her right hand.  She thinks she was bit by an insect the night of 3/21 while staying in a hotel.  States it started as a small bump on her right fifth finger, and has slowly been getting bigger and more red.  Was seen at clinic 3/26 and prescribed Keflex 300 mg 3 times daily x 5 days.  States she took 5 doses with no improvement, so she went to clinic again today and was sent for direct admission for IV antibiotics. Also notes a small bump to her left eyelid that she believes was another insect bite.  Review Of Systems: Per HPI with the following additions: none  Pertinent Past Medical History: Anemia Asthma Eczema Hepatitis C MRSA Subdural hematoma 2015 Substance use disorder  Remainder reviewed in history tab.   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  Pertinent Social History: Tobacco use: Former cigarette smoker quit 7 years ago, but vapes Alcohol use: Socially, monthly Other Substance use: Marijuana Lives with self and husband  Pertinent Family History: Mother-thyroid disease, breast cancer Father-alcohol abuse  Remainder reviewed in history tab.   Important Outpatient Medications: Albuterol inhaler 1-2 puffs PRN Adderall XR 30mg  BID Hair skin and nails vitamin Buprenorphine 8mg  sublingual every morning  Clobetasol ointment BID Multivitamin Kenalog ointment 0.5% PRN Chantix 1mg  BID  Remainder reviewed in medication history.   Objective: BP 112/75   Pulse (!) 58   Temp 98.3 F (36.8 C)   SpO2 100%  Exam: General:  Well-appearing, no acute distress ENTM: Moist mucous membranes Cardiovascular: Regular rate and rhythm Respiratory: Breathing comfortably on room air MSK: Erythema over right fifth digit as seen in photos.  No warmth.  No tenderness to palpation.  Limited range of motion of right fifth digit secondary to pain. 2+ radial pulses.    Labs:  CBC BMET  No results for input(s): "WBC", "HGB", "HCT", "PLT" in the last 168 hours. No results for input(s): "NA", "K", "CL", "CO2", "BUN", "CREATININE", "GLUCOSE", "CALCIUM" in the last 168 hours.  CBC, CMP, Blood cultures pending  Imaging Studies Performed:  XR R hand 11/02/23 IMPRESSION: Negative. 6 mm in maximum longitudinal diameter metallic foreign body in the subcutaneous tissues of the dorsal aspect of the hand adjacent to the base of the fifth metacarpal correlates with a small metallic foreign body such as a needle fragment   Everhart, Kirstie, DO 11/19/2023, 5:52 PM PGY-1, [facility]  FPTS Intern pager: [phone], text pages welcome Secure chat group [facility] Teaching Service

## 2023-11-19 NOTE — Care Plan (Addendum)
 Secure chat by RN that pt with 8/10 pain in hand. Went bedside to assess. Pt reports pain is bad, but no worse than on arrival. Has Tylenol and ibuprofen PRN, would like something stronger.  On exam no sign of compartment syndrome. Pt has full ROM of fingers of right hand (though painful), sensation intact. Erythema is expanding outside of earlier area of demarcation:    For pain - continue tylenol and ibuprofen PRN; will add 2.5 mg oxycodone for breakthrough pain control.  Cyndia Skeeters, DO [facility], PGY-1 11/19/23 9:43 PM  Service pager [phone]

## 2023-11-19 NOTE — Hospital Course (Addendum)
 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 Her hospital course is detailed below:  Cellulitis R hand Suspected to be due to insect bite on 3/21.  Failed outpatient cephalexin.  Started IV cefepime and daptomycin (3/27-3/28) due to history of red man syndrome with vanc.  Blood cultures obtained pending at time of discharge.  Did note on imaging earlier this month possible metallic foreign body at base of R 5th metacarpal, but likely unrelated given location of infection.  Orthopedic hand surgery consulted who proceeded with I&D 3/27, cultures were obtained and wound was packed.  Ortho plans for hydrotherapy to start in 2-4 days.  Patient was advised to remain 1 more day for IV antibiotics, but she unfortunately had to leave due to multiple life commitments.  She received 2nd dose of IV daptomycin 3/28 prior to discharge, will start oral linezolid 600 mg Q12h 3/29-4/3 for total 7 day antibiotics course.  Wound care recs in discharge summary per Ortho and return precautions discussed.  Other chronic conditions were medically managed with home medications and formulary alternatives as necessary (substance use disorder, ADHD).  PCP Follow-up Recommendations: Planning for hydrotherapy outpatient in 2-4 days, will need to ensure that is set up with Ortho. Ensure adequate wound care supplies and techniques.

## 2023-11-19 NOTE — Assessment & Plan Note (Addendum)
 Cellulitis of right fifth digit failed outpatient antibiotic therapy. 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  Suspected to be in the setting of insect bite however patient did have x-ray of right hand this month showing possible foreign body at base of fifth metacarpal- this appears to be unrelated given location of infection. Will hold off on MRI at this time given FB may be metal. Hx of redman's and pruritic rash with Vanc + Gentamicin. - Admitted to FMTS, Dr. Jennette Kettle attending - MedSurg - Hand surgery consulted, appreciate recommendations - IV cefepime, daptomycin - Ibuprofen 600 mg every 6 hours as needed for pain - Lovenox for VTE prophylaxis - Blood cultures pending - Close monitoring to ensure patient does not develop compartment syndrome - AM CBC, CMP

## 2023-11-19 NOTE — Progress Notes (Signed)
 Pharmacy Antibiotic Note  Tiffany Salazar is a 43 y.o. female admitted on 11/19/2023 with  bug bite infection on 5th R metacarpal joint . She has been on Keflex x 5 days pta. Pharmacy has been consulted for Cefepime and Daptomycin dosing. Noted pt with vancomycin allergy (red man's syndrome/rash)  Plan: Cefepime 2gm IV q8h Daptomycin 300mg  (4mg /kg) q24h Will f/u renal function, micro data, and pt's clinical condition  Height: 5\' 8"  (172.7 cm) Weight: 69.2 kg (152 lb 8.9 oz) IBW/kg (Calculated) : 63.9  Temp (24hrs), Avg:98.3 F (36.8 C), Min:98.3 F (36.8 C), Max:98.3 F (36.8 C)  Recent Labs  Lab 11/19/23 1826  WBC 10.1  CREATININE 0.98    Estimated Creatinine Clearance: 77 mL/min (by C-G formula based on SCr of 0.98 mg/dL).    Allergies  Allergen Reactions   Gentamicin Rash    Red man syndrome    Vancomycin Rash    Red man syndrome    Antimicrobials this admission: 3/27 daptomycin >>  3/27 cefepime  >>   Microbiology results: 3/27 BCx:   Thank you for allowing pharmacy to be a part of this patient's care.  Christoper Fabian, PharmD, BCPS Please see amion for complete clinical pharmacist phone list 11/19/2023 7:49 PM

## 2023-11-19 NOTE — Care Plan (Signed)
 Secure chat by RN that patient reporting 8/10 pain in hand.  Dr. Mliss Sax going to bedside to evaluate.  Unsure if I&D will be this evening- reviewed procedures and her surgery is not yet posted with a time.  In the meantime, will monitor closely for signs of compartment syndrome (pulselessness, pallor, etc, severe pain, numbness) and page on-call hand surgery if necessary.  For pain- will offer Ibuprofen/Tylenol first line and have discussion about full opioid agonist with patient if pain is severe enough and not responsive to first line meds.  Darral Dash, DO

## 2023-11-19 NOTE — Consult Note (Signed)
 Tiffany Salazar is an 43 y.o. female.   Chief Complaint: finger infection HPI: 43 yo female with right small finger infection.  States she thinks it started as a bite from over the weekend.  Has become progressively more erythematous and painful.  Streaking into hand.  Admitted by family medicine service for antibiotics.  Allergies:  Allergies  Allergen Reactions   Gentamicin Rash    Red man syndrome    Vancomycin Rash    Red man syndrome    Past Medical History:  Diagnosis Date   AMA (advanced maternal age) multigravida 35+, unspecified trimester 09/20/2018   Anemia    Asthma    seasonal   Eczema of both hands 06/03/2021   Hepatitis C    Hep C   MRSA infection 03/21/2015   Pregnancy complicated by subutex maintenance, antepartum (HCC) 10/20/2018   Shortness of breath 04/29/2023   Subdural hematoma (HCC) 12/26/2013   Substance use disorder    Tricuspid valve replaced    Unwanted fertility 02/09/2019   Signed BTL papers    Past Surgical History:  Procedure Laterality Date   BREAST BIOPSY Right 05/22/2023   MM RT BREAST BX W LOC DEV EA AD LESION IMG BX SPEC STEREO GUIDE 05/22/2023 [facility]   BREAST BIOPSY Right 05/22/2023   MM RT BREAST BX W LOC DEV 1ST LESION IMAGE BX SPEC STEREO GUIDE 05/22/2023 [facility]   BUNIONECTOMY Bilateral    BUNIONECTOMY Right 01/09/2021   Procedure: TAILOR's BUNIONECTOMY RIGHT;  Surgeon: Park Liter, DPM;  Location: [facility] ORS;  Service: Podiatry;  Laterality: Right;   CARDIAC SURGERY     CESAREAN SECTION  x 3   CESAREAN SECTION WITH BILATERAL TUBAL LIGATION Bilateral 03/29/2019   Procedure: CESAREAN SECTION WITH BILATERAL TUBAL LIGATION;  Surgeon: Allie Bossier, MD;  Location: [facility] LD ORS;  Service: Obstetrics;  Laterality: Bilateral;   TEE WITHOUT CARDIOVERSION N/A 03/23/2018   Procedure: TRANSESOPHAGEAL ECHOCARDIOGRAM (TEE);  Surgeon: Chrystie Nose, MD;  Location: [facility] ENDOSCOPY;  Service: Cardiovascular;  Laterality: N/A;    TEE WITHOUT CARDIOVERSION N/A 03/29/2018   Procedure: TRANSESOPHAGEAL ECHOCARDIOGRAM (TEE);  Surgeon: Alleen Borne, MD;  Location: [facility] OR;  Service: Open Heart Surgery;  Laterality: N/A;   TRICUSPID VALVE REPLACEMENT N/A 03/29/2018   Procedure: TRICUSPID VALVE REPLACEMENT Using 27 mm Magna Ease Mitral Valve Pericardial Bioprosthesis;  Surgeon: Alleen Borne, MD;  Location: [facility] OR;  Service: Open Heart Surgery;  Laterality: N/A;   TUBAL LIGATION      Family History: Family History  Problem Relation Age of Onset   Thyroid disease Mother    Breast cancer Mother 59       breast   Alcohol abuse Father     Social History:   reports that she has been smoking e-cigarettes and cigarettes. She uses smokeless tobacco. She reports that she does not currently use alcohol. She reports that she does not currently use drugs after having used the following drugs: IV, Cocaine, Heroin, and Marijuana.  Medications: Medications Prior to Admission  Medication Sig Dispense Refill   albuterol (VENTOLIN HFA) 108 (90 Base) MCG/ACT inhaler Use 1-2 puffs as needed for wheezing 3 times a day 18 g 3   amphetamine-dextroamphetamine (ADDERALL XR) 30 MG 24 hr capsule Take 1 capsule (30 mg total) by mouth 2 (two) times daily. (Patient taking differently: Take 30 mg by mouth See admin instructions. Take 1 tablet by mouth every morning. Able to take 1 tablet extra if needed throughout  the day.) 60 capsule 0   Biotin w/ Vitamins C & E (HAIR SKIN & NAILS GUMMIES PO) Take 1 each by mouth in the morning.     buprenorphine (SUBUTEX) 8 MG SUBL SL tablet Place 8 mg under the tongue in the morning.     cephALEXin (KEFLEX) 500 MG capsule Take 1 capsule (500 mg total) by mouth 3 (three) times daily for 5 days. 15 capsule 0   clobetasol ointment (TEMOVATE) 0.05 % Apply 1 Application topically 2 (two) times daily. Then use twice weekly thereafter (Patient taking differently: Apply 1 Application topically 2 (two) times daily.) 60 g 2    ibuprofen (ADVIL) 200 MG tablet Take 400 mg by mouth as needed for fever, headache, mild pain (pain score 1-3) or moderate pain (pain score 4-6).     Multiple Vitamin (MULTIVITAMIN WITH MINERALS) TABS tablet Take 1 tablet by mouth in the morning.     triamcinolone ointment (KENALOG) 0.5 % Apply 1 Application topically 2 (two) times daily.     varenicline (CHANTIX CONTINUING MONTH PAK) 1 MG tablet Take 1 tablet (1 mg total) by mouth 2 (two) times daily. 60 tablet 1    No results found for this or any previous visit (from the past 48 hours).  No results found.    Blood pressure 112/75, pulse (!) 58, temperature 98.3 F (36.8 C), temperature source Oral, resp. rate 18, height 5\' 8"  (1.727 m), weight 69.2 kg, SpO2 100%.  General appearance: alert, cooperative, and appears stated age Head: Normocephalic, without obvious abnormality, atraumatic Neck: supple, symmetrical, trachea midline Extremities: Intact sensation and capillary refill all digits.  +epl/fpl/io.  Erythema and pain of right small finger.  Proximal streaking.  No open wound.   Skin: Skin color, texture, turgor normal. No rashes or lesions Neurologic: Grossly normal Incision/Wound: none  Assessment/Plan Right small finger abscess.  Recommend incision and drainage in OR.  Risks, benefits and alternatives of surgery were discussed including risks of blood loss, infection, damage to nerves/vessels/tendons/ligament/bone, failure of surgery, need for additional surgery, complication with wound healing, stiffness, need for repeat irrigation and debridement, amputation.  She voiced understanding of these risks and elected to proceed.    Betha Loa 11/19/2023, 6:46 PM

## 2023-11-19 NOTE — Plan of Care (Signed)
 Spoke with Dr. Merlyn Lot, Ortho & Hand, who said to make pt NPO and she will undergo I&D tonight.

## 2023-11-19 NOTE — Progress Notes (Signed)
    SUBJECTIVE:   CHIEF COMPLAINT / HPI:   Tiffany Salazar is a 43 y.o. female  presenting for worsening hand infection.  Patient was bit by an unknown insect while she was traveling to [facility].  She reports the bite waking her up from sleep.  She was seen in our office yesterday 11/18/2023.  The bite mark appeared stable at the time.  She was started on Keflex 3 times daily for 5 days.  Patient reports she took 5 doses of the antibiotic but the rash and pain continued to worsen.  It is now spread up to her wrist and the pain extends into her proximal arm.  She denies fever, chills, difficulty breathing.  She is unable to move the fifth metacarpal or PIP joint.   PERTINENT  PMH / PSH: Reviewed and updated   OBJECTIVE:   BP 108/76   Pulse (!) 59   Ht 5\' 8"  (1.727 m)   Wt 152 lb 9.6 oz (69.2 kg)   SpO2 100%   BMI 23.20 kg/m   Well-appearing, no acute distress Cardio: Regular rate, regular rhythm, no murmurs on exam. Pulm: Clear, no wheezing, no crackles. No increased work of breathing  Hand: Severe erythema and swelling around the central bite located on the fifth right metacarpal joint.  Erythema has extended past the wrist.   ASSESSMENT/PLAN:   Bug bite infection:  With worsening of erythema, pain and spreading of infection.  Patient has failed outpatient antibiotics.  She warrants direct admission to the [facility] TS team for IV antibiotics, blood cultures, pain control, MRI to rule out soft tissue infection and joint involvement.  Inpatient team notified via secure chat and inpatient attending Dr. Manson Passey evaluated in clinic.  Patient is clinically stable for direct admission and she will wait in our office until a bed is available.  Of note she has a history of tricuspid valve replacement with a bioprosthetic valve which may complicate antibiotic and treatment course.  Glendale Chard, DO [facility]

## 2023-11-19 NOTE — Plan of Care (Signed)
 Placed consult with Orthopedic & Hand Specialist. On-call PA not available after 4 pm. Spoke with office triage nurse. She will page the consult. Right hand cellulitis. C/F metal foreign body in 5th metatarsal, presumed unrelated to current infection- holding off on MRI until we speak with hand.

## 2023-11-20 ENCOUNTER — Other Ambulatory Visit (HOSPITAL_COMMUNITY): Payer: Self-pay

## 2023-11-20 ENCOUNTER — Inpatient Hospital Stay (HOSPITAL_COMMUNITY): Admitting: Anesthesiology

## 2023-11-20 ENCOUNTER — Inpatient Hospital Stay (HOSPITAL_BASED_OUTPATIENT_CLINIC_OR_DEPARTMENT_OTHER): Admitting: Anesthesiology

## 2023-11-20 ENCOUNTER — Encounter (HOSPITAL_COMMUNITY): Payer: Self-pay | Admitting: Student

## 2023-11-20 ENCOUNTER — Encounter (HOSPITAL_COMMUNITY)
Admission: AD | Disposition: A | Discharge status: Home / Self Care | Payer: Self-pay | Source: Ambulatory Visit | Attending: *Deleted

## 2023-11-20 ENCOUNTER — Other Ambulatory Visit: Payer: Self-pay

## 2023-11-20 DIAGNOSIS — L089 Local infection of the skin and subcutaneous tissue, unspecified: Secondary | ICD-10-CM | POA: Diagnosis not present

## 2023-11-20 DIAGNOSIS — Z789 Other specified health status: Secondary | ICD-10-CM

## 2023-11-20 DIAGNOSIS — J45909 Unspecified asthma, uncomplicated: Secondary | ICD-10-CM

## 2023-11-20 DIAGNOSIS — L03113 Cellulitis of right upper limb: Secondary | ICD-10-CM | POA: Diagnosis not present

## 2023-11-20 DIAGNOSIS — L03119 Cellulitis of unspecified part of limb: Secondary | ICD-10-CM | POA: Diagnosis not present

## 2023-11-20 DIAGNOSIS — F17210 Nicotine dependence, cigarettes, uncomplicated: Secondary | ICD-10-CM

## 2023-11-20 DIAGNOSIS — L02511 Cutaneous abscess of right hand: Secondary | ICD-10-CM | POA: Diagnosis not present

## 2023-11-20 HISTORY — PX: INCISION AND DRAINAGE OF WOUND: SHX1803

## 2023-11-20 LAB — CBC WITH DIFFERENTIAL/PLATELET
Abs Immature Granulocytes: 0.01 10*3/uL (ref 0.00–0.07)
Basophils Absolute: 0.1 10*3/uL (ref 0.0–0.1)
Basophils Relative: 1 %
Eosinophils Absolute: 0.2 10*3/uL (ref 0.0–0.5)
Eosinophils Relative: 3 %
HCT: 38.6 % (ref 36.0–46.0)
Hemoglobin: 12.7 g/dL (ref 12.0–15.0)
Immature Granulocytes: 0 %
Lymphocytes Relative: 42 %
Lymphs Abs: 3.3 10*3/uL (ref 0.7–4.0)
MCH: 32 pg (ref 26.0–34.0)
MCHC: 32.9 g/dL (ref 30.0–36.0)
MCV: 97.2 fL (ref 80.0–100.0)
Monocytes Absolute: 0.5 10*3/uL (ref 0.1–1.0)
Monocytes Relative: 6 %
Neutro Abs: 3.8 10*3/uL (ref 1.7–7.7)
Neutrophils Relative %: 48 %
Platelets: 217 10*3/uL (ref 150–400)
RBC: 3.97 MIL/uL (ref 3.87–5.11)
RDW: 13.2 % (ref 11.5–15.5)
WBC: 7.8 10*3/uL (ref 4.0–10.5)
nRBC: 0 % (ref 0.0–0.2)

## 2023-11-20 LAB — BASIC METABOLIC PANEL WITH GFR
Anion gap: 10 (ref 5–15)
BUN: 19 mg/dL (ref 6–20)
CO2: 24 mmol/L (ref 22–32)
Calcium: 8.2 mg/dL — ABNORMAL LOW (ref 8.9–10.3)
Chloride: 108 mmol/L (ref 98–111)
Creatinine, Ser: 0.89 mg/dL (ref 0.44–1.00)
GFR, Estimated: >60 mL/min (ref >60)
Glucose, Bld: 90 mg/dL (ref 70–99)
Potassium: 4.2 mmol/L (ref 3.5–5.1)
Sodium: 142 mmol/L (ref 135–145)

## 2023-11-20 SURGERY — IRRIGATION AND DEBRIDEMENT WOUND
Anesthesia: General | Laterality: Right

## 2023-11-20 MED ORDER — OXYCODONE HCL 5 MG PO TABS: 5.0000 mg | ORAL_TABLET | Freq: Once | ORAL | Status: DC | PRN

## 2023-11-20 MED ORDER — LIDOCAINE 2% (20 MG/ML) 5 ML SYRINGE
INTRAMUSCULAR | Status: AC
  Filled 2023-11-20: qty 5

## 2023-11-20 MED ORDER — BUPIVACAINE HCL (PF) 0.25 % IJ SOLN
INTRAMUSCULAR | Status: DC | PRN
  Administered 2023-11-20: 9 mL

## 2023-11-20 MED ORDER — KETAMINE HCL 10 MG/ML IJ SOLN
INTRAMUSCULAR | Status: DC | PRN
  Administered 2023-11-20: 20 mg via INTRAVENOUS
  Administered 2023-11-20: 10 mg via INTRAVENOUS

## 2023-11-20 MED ORDER — LINEZOLID 600 MG PO TABS
600.0000 mg | ORAL_TABLET | Freq: Two times a day (BID) | ORAL | 0 refills | Status: AC
  Filled 2023-11-20: qty 10, 5d supply, fill #0

## 2023-11-20 MED ORDER — FENTANYL CITRATE (PF) 100 MCG/2ML IJ SOLN: 25.0000 ug | INTRAMUSCULAR | Status: DC | PRN

## 2023-11-20 MED ORDER — LIDOCAINE 2% (20 MG/ML) 5 ML SYRINGE
INTRAMUSCULAR | Status: DC | PRN
  Administered 2023-11-20: 60 mg via INTRAVENOUS

## 2023-11-20 MED ORDER — 0.9 % SODIUM CHLORIDE (POUR BTL) OPTIME
TOPICAL | Status: DC | PRN
  Administered 2023-11-20: 1000 mL

## 2023-11-20 MED ORDER — KETAMINE HCL 50 MG/5ML IJ SOSY
PREFILLED_SYRINGE | INTRAMUSCULAR | Status: AC
  Filled 2023-11-20: qty 5

## 2023-11-20 MED ORDER — BUPIVACAINE HCL (PF) 0.25 % IJ SOLN
INTRAMUSCULAR | Status: AC
  Filled 2023-11-20: qty 30

## 2023-11-20 MED ORDER — ACETAMINOPHEN 160 MG/5ML PO SOLN: 1000.0000 mg | Freq: Once | ORAL | Status: DC | PRN

## 2023-11-20 MED ORDER — LINEZOLID 600 MG PO TABS
600.0000 mg | ORAL_TABLET | Freq: Two times a day (BID) | ORAL | 0 refills | Status: DC
  Filled 2023-11-20: qty 8, 4d supply, fill #0

## 2023-11-20 MED ORDER — OXYCODONE HCL 5 MG/5ML PO SOLN: 5.0000 mg | Freq: Once | ORAL | Status: DC | PRN

## 2023-11-20 MED ORDER — PROPOFOL 10 MG/ML IV BOLUS
INTRAVENOUS | Status: DC | PRN
Start: 2023-11-20 — End: 2023-11-20
  Administered 2023-11-20: 180 mg via INTRAVENOUS

## 2023-11-20 MED ORDER — ACETAMINOPHEN 10 MG/ML IV SOLN: 1000.0000 mg | Freq: Once | INTRAVENOUS | Status: DC | PRN

## 2023-11-20 MED ORDER — ONDANSETRON HCL 4 MG/2ML IJ SOLN
INTRAMUSCULAR | Status: DC | PRN
  Administered 2023-11-20: 4 mg via INTRAVENOUS

## 2023-11-20 MED ORDER — DEXMEDETOMIDINE HCL IN NACL 80 MCG/20ML IV SOLN
INTRAVENOUS | Status: DC | PRN
Start: 2023-11-20 — End: 2023-11-20
  Administered 2023-11-20: 8 ug via INTRAVENOUS

## 2023-11-20 MED ORDER — ACETAMINOPHEN 500 MG PO TABS: 1000.0000 mg | ORAL_TABLET | Freq: Once | ORAL | Status: DC | PRN

## 2023-11-20 MED ORDER — DAPTOMYCIN-SODIUM CHLORIDE 500-0.9 MG/50ML-% IV SOLN
500.0000 mg | Freq: Every day | INTRAVENOUS | Status: DC
  Administered 2023-11-20: 500 mg via INTRAVENOUS
  Filled 2023-11-20: qty 50

## 2023-11-20 MED ORDER — MIDAZOLAM HCL 2 MG/2ML IJ SOLN
INTRAMUSCULAR | Status: DC | PRN
Start: 2023-11-20 — End: 2023-11-20
  Administered 2023-11-20: 2 mg via INTRAVENOUS

## 2023-11-20 MED ORDER — PROPOFOL 10 MG/ML IV BOLUS
INTRAVENOUS | Status: AC
  Filled 2023-11-20: qty 20

## 2023-11-20 MED ORDER — MIDAZOLAM HCL 2 MG/2ML IJ SOLN
INTRAMUSCULAR | Status: AC
Start: 2023-11-20 — End: ?
  Filled 2023-11-20: qty 2

## 2023-11-20 MED ORDER — LACTATED RINGERS IV SOLN: INTRAVENOUS | Status: DC | PRN

## 2023-11-20 MED ORDER — ONDANSETRON HCL 4 MG PO TABS
4.0000 mg | ORAL_TABLET | Freq: Three times a day (TID) | ORAL | 1 refills | Status: AC | PRN
  Filled 2023-11-20: qty 15, 5d supply, fill #0

## 2023-11-20 SURGICAL SUPPLY — 54 items
ADAPTER CATH SYR TO TUBING 38M (ADAPTER) ×1 IMPLANT
BAG COUNTER SPONGE SURGICOUNT (BAG) ×1 IMPLANT
BNDG COHESIVE 2X5 TAN ST LF (GAUZE/BANDAGES/DRESSINGS) IMPLANT
BNDG ELASTIC 3INX 5YD STR LF (GAUZE/BANDAGES/DRESSINGS) ×1 IMPLANT
BNDG ELASTIC 4X5.8 VLCR STR LF (GAUZE/BANDAGES/DRESSINGS) ×1 IMPLANT
BNDG ESMARK 4X9 LF (GAUZE/BANDAGES/DRESSINGS) IMPLANT
BNDG GAUZE DERMACEA FLUFF 4 (GAUZE/BANDAGES/DRESSINGS) ×1 IMPLANT
CANNULA VESSEL 3MM 2 BLNT TIP (CANNULA) IMPLANT
CORD BIPOLAR FORCEPS 12FT (ELECTRODE) ×1 IMPLANT
COVER SURGICAL LIGHT HANDLE (MISCELLANEOUS) ×1 IMPLANT
CUFF TOURN SGL QUICK 18X4 (TOURNIQUET CUFF) IMPLANT
CUFF TRNQT CYL 24X4X16.5-23 (TOURNIQUET CUFF) IMPLANT
DRAIN PENROSE 12X.25 LTX STRL (MISCELLANEOUS) IMPLANT
DRSG XEROFORM 1X8 (GAUZE/BANDAGES/DRESSINGS) IMPLANT
GAUZE PACKING IODOFORM 1/2INX (GAUZE/BANDAGES/DRESSINGS) IMPLANT
GAUZE PAD ABD 8X10 STRL (GAUZE/BANDAGES/DRESSINGS) ×2 IMPLANT
GAUZE SPONGE 4X4 12PLY STRL (GAUZE/BANDAGES/DRESSINGS) ×1 IMPLANT
GAUZE SPONGE 4X4 12PLY STRL LF (GAUZE/BANDAGES/DRESSINGS) IMPLANT
GAUZE XEROFORM 1X8 LF (GAUZE/BANDAGES/DRESSINGS) ×1 IMPLANT
GLOVE BIO SURGEON STRL SZ7.5 (GLOVE) ×1 IMPLANT
GLOVE BIOGEL PI IND STRL 8 (GLOVE) ×1 IMPLANT
GLOVE BIOGEL PI IND STRL 8.5 (GLOVE) IMPLANT
GLOVE PI ORTHO PRO STRL SZ8 (GLOVE) IMPLANT
GLOVE SURG ORTHO 8.0 STRL STRW (GLOVE) IMPLANT
GOWN STRL REUS W/ TWL LRG LVL3 (GOWN DISPOSABLE) ×1 IMPLANT
GOWN STRL REUS W/ TWL XL LVL3 (GOWN DISPOSABLE) ×1 IMPLANT
KIT BASIN OR (CUSTOM PROCEDURE TRAY) ×1 IMPLANT
KIT TURNOVER KIT B (KITS) ×1 IMPLANT
LOOP VASCLR MAXI BLUE 18IN ST (MISCELLANEOUS) IMPLANT
LOOPS VASCLR MAXI BLUE 18IN ST (MISCELLANEOUS) ×1 IMPLANT
MANIFOLD NEPTUNE II (INSTRUMENTS) IMPLANT
NDL HYPO 25X1 1.5 SAFETY (NEEDLE) IMPLANT
NEEDLE HYPO 25X1 1.5 SAFETY (NEEDLE) IMPLANT
NS IRRIG 1000ML POUR BTL (IV SOLUTION) ×1 IMPLANT
PACK ORTHO EXTREMITY (CUSTOM PROCEDURE TRAY) ×1 IMPLANT
PAD ARMBOARD POSITIONER FOAM (MISCELLANEOUS) ×2 IMPLANT
PADDING CAST COTTON 6X4 STRL (CAST SUPPLIES) IMPLANT
SET CYSTO W/LG BORE CLAMP LF (SET/KITS/TRAYS/PACK) IMPLANT
SOL PREP POV-IOD 4OZ 10% (MISCELLANEOUS) ×2 IMPLANT
SPIKE FLUID TRANSFER (MISCELLANEOUS) ×1 IMPLANT
SPONGE T-LAP 4X18 ~~LOC~~+RFID (SPONGE) ×1 IMPLANT
SUT ETHILON 4 0 P 3 18 (SUTURE) IMPLANT
SUT ETHILON 4 0 PS 2 18 (SUTURE) IMPLANT
SUT MON AB 5-0 P3 18 (SUTURE) IMPLANT
SWAB COLLECTION DEVICE MRSA (MISCELLANEOUS) IMPLANT
SWAB CULTURE ESWAB REG 1ML (MISCELLANEOUS) IMPLANT
SYR 20ML LL LF (SYRINGE) ×1 IMPLANT
SYR CONTROL 10ML LL (SYRINGE) IMPLANT
TIE VASCULAR MAXI BLUE 18IN ST (MISCELLANEOUS) ×1 IMPLANT
TOWEL GREEN STERILE (TOWEL DISPOSABLE) ×1 IMPLANT
TUBE CONNECTING 12X1/4 (SUCTIONS) ×1 IMPLANT
TUBE NG 5FR 35IN ENFIT (TUBING) IMPLANT
UNDERPAD 30X36 HEAVY ABSORB (UNDERPADS AND DIAPERS) ×1 IMPLANT
YANKAUER SUCT BULB TIP NO VENT (SUCTIONS) ×1 IMPLANT

## 2023-11-20 NOTE — Progress Notes (Signed)
 Printed and reviewed AVS  all questions Answered all questions   Will receive last dose IV and discharge afterwards.

## 2023-11-20 NOTE — Care Plan (Addendum)
 FMTS Brief Progress Note  S: Patient seen at bedside for postop check.  She is feeling much better, reports she can already feel relief after I&D.  No concerns at this time.   O: BP 128/80 (BP Location: Right Leg)   Pulse (!) 48   Temp 98 F (36.7 C)   Resp 15   Ht 5\' 8"  (1.727 m)   Wt 69.2 kg   SpO2 98%   BMI 23.20 kg/m   General: Well-appearing, NAD Extremities: Right arm wrapped, clean and dry  A/P: Right hand abscess S/p I&D.  On cefepime and daptomycin per pharmacy. - Continue pain management with scheduled Tylenol, ibuprofen PRN, and oxycodone PRN for breakthrough pain x2 doses.  So far has not required oxycodone. - Will restart diet - Orders reviewed. Labs for AM ordered, which was adjusted as needed.  - If condition changes, plan includes reevaluation.   Cyndia Skeeters, DO 11/20/2023, 4:54 AM PGY-1, [facility] Night Resident  Please page [phone] with questions.

## 2023-11-20 NOTE — Assessment & Plan Note (Deleted)
 Substance use  -  IVDU (not since 2019). Continue Buprenorphine 8mg  daily ADHD - continue Adderall 30 mg daily

## 2023-11-20 NOTE — Op Note (Signed)
 NAME: Tiffany Salazar MEDICAL RECORD NO: [id number] DATE OF BIRTH: 1981-05-19 FACILITY: [facility] LOCATION: [facility] OR PHYSICIAN: Tami Ribas, MD   OPERATIVE REPORT   DATE OF PROCEDURE: 11/20/23    PREOPERATIVE DIAGNOSIS: Right small finger abscess   POSTOPERATIVE DIAGNOSIS: Right small finger abscess   PROCEDURE: Incision and drainage right small finger abscess   SURGEON:  Betha Loa, M.D.   ASSISTANT: none   ANESTHESIA:  General   INTRAVENOUS FLUIDS:  Per anesthesia flow sheet.   ESTIMATED BLOOD LOSS:  Minimal.   COMPLICATIONS:  None.   SPECIMENS: Cultures to micro   TOURNIQUET TIME:   Right arm: Approximately 15 minutes at 250 mmHg   DISPOSITION:  Stable to PACU.   INDICATIONS: 43 year old female states she has had worsening swelling and erythema and pain of the right small finger over the past several days.  She think she was bitten by something last weekend.  She was admitted by the family medicine service.  I recommend incision and drainage in the operating room.  Risks, benefits and alternatives of surgery were discussed including the risks of blood loss, infection, damage to nerves, vessels, tendons, ligaments, bone for surgery, need for additional surgery, complications with wound healing, continued pain, stiffness, , need for repeat irrigation and debridement.  She voiced understanding of these risks and elected to proceed.  OPERATIVE COURSE:  After being identified preoperatively by myself,  the patient and I agreed on the procedure and site of the procedure.  The surgical site was marked.  Surgical consent had been signed.  She is on scheduled antibiotics.  She was transferred to the operating room and placed on the operating table in supine position with the Right upper extremity on an arm board.  General anesthesia was induced by the anesthesiologist.  Right upper extremity was prepped and draped in normal sterile orthopedic fashion.  A surgical pause was performed  between the surgeons, anesthesia, and operating room staff and all were in agreement as to the patient, procedure, and site of procedure.  Tourniquet at the proximal aspect of the extremity was inflated to 250 mmHg after exsanguination of the arm with an Esmarch bandage.  The darkened area of skin was unroofed.  There was gross purulence underneath.  Cultures were taken for aerobes and anaerobes.  An incision was made adjacent to this and carried in subcutaneous tissues by spreading technique.  There was gross purulence in the subcutaneous tissues.  The area was debrided using the pickups and scissors as well as the Ray-Tec sponge.  The area that was blackened of skin was sharply removed with the scissors.  No remaining purulence was noted.  The wound was copiously irrigated with sterile saline and packed with iodoform gauze.  Digital block was performed with quarter percent plain Marcaine to aid in postoperative analgesia.  The wound was dressed with sterile 4 x 4's and wrapped with a Kerlix bandage.  Volar splint was placed and wrapped with Kerlix and Ace bandage.  The tourniquet was deflated at approximately 15 minutes.  Fingertips were pink with brisk capillary refill after deflation of tourniquet.  The operative  drapes were broken down.  The patient was awoken from anesthesia safely.  She was transferred back to the stretcher and taken to PACU in stable condition.  She is currently admitted to the family medicine service.   Betha Loa, MD Electronically signed, 11/20/23

## 2023-11-20 NOTE — Assessment & Plan Note (Deleted)
 Cellulitis of right fifth digit failed outpatient antibiotic therapy.  Suspected 2/2 bug bite, though noted metal foreign body at base of fifth metacarpal on past XR.  Hx of redman's and pruritic rash with vancomycin + gentamicin. -IV cefepime, daptomycin, transition to ***linezolid 600 mg Q12h total of 7 days -Pain regimen: Ibuprofen 600 mg Q6h PRN, did get oxycodone 2.5 mg BID x2 doses overnight -Blood cultures pending -Close monitoring to ensure patient does not develop compartment syndrome -AM CBC, CMP -Hand surgery consulted, appreciate recommendations:  -

## 2023-11-20 NOTE — Discharge Instructions (Addendum)
 Dear Tiffany Salazar,   Thank you so much for allowing [place] to be part of your care!  You were admitted to [facility] for cellulitis (infection) of your right hand after a suspected spider bite.  Your wound was drained and you received IV antibiotics.  You will continue the antibiotic linezolid outpatient as directed in the attached packet.  POST-HOSPITAL & CARE INSTRUCTIONS You can take ibuprofen and/or acetaminophen up to every 6 hours each. Please be sure to keep your dressing dry and clean, including when showering/bathing. See Orthopedic wound care recommendations below. Please follow up with your Orthopedic surgeon outpatient for hydrotherapy. Please let PCP/Specialists know of any changes that were made.  Please see medications section of this packet for any medication changes.   DOCTOR'S APPOINTMENT & FOLLOW UP CARE INSTRUCTIONS  Future Appointments  Date Time Provider Department Center  11/24/2023  8:30 AM Lincoln Brigham, MD [facility]  12/22/2023  2:40 PM GI-[facility] DIAG TOMO 1 GI-[facility]GMM [facility]  12/25/2023  8:30 AM Westley Chandler, MD [facility]  01/12/2024  8:40 AM Legrand Como, PA-C [facility]  01/27/2024  8:15 AM Felecia Shelling, DPM [facility]  02/03/2024  8:15 AM Felecia Shelling, DPM [facility]  02/17/2024  8:15 AM Logan Bores, Larena Glassman, DPM [facility]    RETURN PRECAUTIONS: Please immediately seek medical care if you notice fevers >100.4 F, increased drainage from your wound site, or increasing pain.  Take care and be well!  Family Medicine Teaching Service Inpatient Team [facility]  [address] [place], [place] [address] [phone]   Hand Center Instructions Hand Surgery  Wound Care: Keep your hand elevated above the level of your heart.  Do not allow it to dangle by your side.  Keep the dressing dry and do not remove it unless your doctor advises you to do so.  He will usually change it at the time of  your post-op visit.  Moving your fingers is advised to stimulate circulation but will depend on the site of your surgery.  If you have a splint applied, your doctor will advise you regarding movement.  Activity: Do not drive or operate machinery today.  Rest today and then you may return to your normal activity and work as indicated by your physician.  Diet:  Drink liquids today or eat a light diet.  You may resume a regular diet tomorrow.    General expectations: Pain for two to three days. Fingers may become slightly swollen.  Call your doctor if any of the following occur: Severe pain not relieved by pain medication. Elevated temperature. Dressing soaked with blood. Inability to move fingers. White or bluish color to fingers.

## 2023-11-20 NOTE — Discharge Summary (Signed)
 Family Medicine Teaching [facility] Discharge Summary  Patient name: Tiffany Salazar Medical record number: [id number] Date of birth: [birth year] Age: 43 y.o. 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 Her hospital course is detailed below:  Cellulitis R hand Suspected to be due to insect bite on 3/21.  Failed outpatient cephalexin.  Started IV cefepime and daptomycin (3/27-3/28) due to history of red man syndrome with vanc.  Blood cultures obtained pending at time of discharge.  Did note on imaging earlier this month possible metallic foreign body at base of R 5th metacarpal, but likely unrelated given location of infection.  Orthopedic hand surgery consulted who proceeded with I&D 3/27, cultures were obtained and wound was packed.  Ortho plans for hydrotherapy to start in 2-4 days.  Patient was advised to remain 1 more day for IV antibiotics, but she unfortunately had to leave due to multiple life commitments.  She received 2nd dose of IV daptomycin 3/28 prior to discharge, will start oral linezolid 600 mg Q12h 3/29-4/3 for total 7 day antibiotics course.  Wound care recs in discharge summary per Ortho and return precautions discussed.  Other chronic conditions were medically managed with home medications  and formulary alternatives as necessary (substance use disorder, ADHD).  PCP Follow-up Recommendations: Planning for hydrotherapy outpatient in 2-4 days, will need to ensure that is set up with Ortho. Ensure adequate wound care supplies and techniques.  Disposition: Home  Discharge Condition: Stable  Discharge Exam:  Vitals:   11/20/23 0533 11/20/23 0758  BP: 102/67 116/71  Pulse:  (!) 57  Resp: 16 16  Temp: 98.4 F (36.9 C)   SpO2: 99% 98%   Physical Exam: General: Resting comfortably in bed, NAD, alert and at baseline. Cardiovascular: Regular rate and rhythm. Normal S1/S2. No murmurs, rubs, or gallops appreciated. 2+ radial pulses. Pulmonary: Clear bilaterally to auscultation; no wheezes, crackles, or rhonchi. Normal WOB on room air, no accessory muscle usage. Abdominal: Normoactive bowel sounds, nondistended. No tenderness to deep or light palpation. No rebound or guarding. Skin: Warm and dry.  Small papule over L eye, not indurated, nontender to palpation. Extremities: R fifth digit as below in photo.  Appropriately TTP, minimal drainage.  2+ distal pulses.  Fingers warm and well-perfused.  No necrosis or purulence noted.  Decreased cellulitis relative to markings.  No peripheral edema bilaterally. Capillary refill <2 seconds.     Significant Procedures: -I&D 3/27  Significant Labs and Imaging:  Recent Labs  Lab 11/19/23 1826 11/20/23 0446  WBC 10.1 7.8  HGB 12.6 12.7  HCT 38.8 38.6  PLT 216 217   Recent Labs  Lab 11/19/23 1826 11/20/23 0446  NA 139 142  K 4.2 4.2  CL 105 108  CO2 25 24  GLUCOSE 84 90  BUN 21* 19  CREATININE 0.98 0.89  CALCIUM 8.5* 8.2*  ALKPHOS 46  --  AST 20  --   ALT 14  --   ALBUMIN 3.3*  --    - R hand XR 3/10:  6 mm longitudinal diameter metallic foreign body in the subcutaneous tissues of the dorsal aspect of the hand adjacent to the base of the fifth metacarpal correlates with a small metallic foreign body such as a needle  fragment.  Results/Tests Pending at Time of Discharge: - Blood cultures: Pending - Wound cultures: Pending  Discharge Medications:  Allergies as of 11/20/2023       Reactions   Gentamicin Rash   Red man syndrome   Vancomycin Rash   Red man syndrome        Medication List     STOP taking these medications    cephALEXin 500 MG capsule Commonly known as: KEFLEX       TAKE these medications    albuterol 108 (90 Base) MCG/ACT inhaler Commonly known as: Ventolin HFA Use 1-2 puffs as needed for wheezing 3 times a day   amphetamine-dextroamphetamine 30 MG 24 hr capsule Commonly known as: ADDERALL XR Take 1 capsule (30 mg total) by mouth 2 (two) times daily. What changed:  when to take this additional instructions   buprenorphine 8 MG Subl SL tablet Commonly known as: SUBUTEX Place 8 mg under the tongue in the morning.   clobetasol ointment 0.05 % Commonly known as: TEMOVATE Apply 1 Application topically 2 (two) times daily. Then use twice weekly thereafter What changed: additional instructions   HAIR SKIN & NAILS GUMMIES PO Take 1 each by mouth in the morning.   ibuprofen 200 MG tablet Commonly known as: ADVIL Take 400 mg by mouth as needed for fever, headache, mild pain (pain score 1-3) or moderate pain (pain score 4-6).   linezolid 600 MG tablet Commonly known as: ZYVOX Take 1 tablet (600 mg total) by mouth 2 (two) times daily for 4 days. Start taking on: November 21, 2023   multivitamin with minerals Tabs tablet Take 1 tablet by mouth in the morning.   triamcinolone ointment 0.5 % Commonly known as: KENALOG Apply 1 Application topically 2 (two) times daily.   varenicline 1 MG tablet Commonly known as: Chantix Continuing Month Pak Take 1 tablet (1 mg total) by mouth 2 (two) times daily.        Discharge Instructions: Please refer to Patient Instructions section of EMR for full details.  Patient was counseled important signs and symptoms that should  prompt return to medical care, changes in medications, dietary instructions, activity restrictions, and follow up appointments.   Follow-Up Appointments: Future Appointments  Date Time Provider Department Center  11/24/2023  8:30 AM Lincoln Brigham, MD [facility]  12/22/2023  2:40 PM GI-BCG DIAG TOMO 1 [facility]  12/25/2023  8:30 AM Westley Chandler, MD [facility]  01/12/2024  8:40 AM Legrand Como, PA-C [facility]  01/27/2024  8:15 AM Felecia Shelling, DPM [facility]  02/03/2024  8:15 AM Felecia Shelling, DPM [facility]  02/17/2024  8:15 AM Felecia Shelling, DPM [facility]    Evonda Enge, MD 11/20/2023, 11:29 AM PGY-1, [facility]

## 2023-11-20 NOTE — Anesthesia Preprocedure Evaluation (Signed)
 Anesthesia Evaluation  Patient identified by MRN, date of birth, ID band Patient awake    Reviewed: Allergy & Precautions, NPO status , Patient's Chart, lab work & pertinent test results  History of Anesthesia Complications Negative for: history of anesthetic complications  Airway Mallampati: II  TM Distance: >3 FB Neck ROM: Full    Dental  (+) Dental Advisory Given, Teeth Intact   Pulmonary shortness of breath, asthma , Current Smoker and Patient abstained from smoking.   breath sounds clear to auscultation       Cardiovascular  Rhythm:Regular   1. Left ventricular ejection fraction, by estimation, is 55 to 60%. Left  ventricular ejection fraction by 3D volume is 58 %. The left ventricle has  normal function. The left ventricle has no regional wall motion  abnormalities. Left ventricular diastolic   parameters were normal.   2. Right ventricular systolic function is normal. The right ventricular  size is normal. There is mildly elevated pulmonary artery systolic  pressure. The estimated right ventricular systolic pressure is 39.8 mmHg.   3. The mitral valve is grossly normal. Trivial mitral valve  regurgitation.   4. Mean gradient 4 mmHg. Peak gradient 25 mmHg. Bioprosthetic valve,  apperas normally functioning. The tricuspid valve is has been  repaired/replaced. Trivial regurgitation.   5. The aortic valve is tricuspid. Aortic valve regurgitation is not  visualized.   6. The inferior vena cava is dilated in size with <50% respiratory  variability, suggesting right atrial pressure of 15 mmHg.   Comparison(s): Changes from prior study are noted. 07/04/2021: LVEF  60-65%, s/p TVR with peak gradient 16 mmHg, mild TR.     Neuro/Psych negative neurological ROS  negative psych ROS   GI/Hepatic ,,,(+) Hepatitis -, C  Endo/Other  negative endocrine ROS  Lab Results      Component                Value               Date                       HGBA1C                   6.1 (H)             10/20/2023             Renal/GU negative Renal ROSLab Results      Component                Value               Date                      NA                       139                 11/19/2023                K                        4.2                 11/19/2023                CO2  25                  11/19/2023                GLUCOSE                  84                  11/19/2023                BUN                      21 (H)              11/19/2023                CREATININE               0.98                11/19/2023                CALCIUM                  8.5 (L)             11/19/2023                EGFR                     78                  10/20/2023                GFRNONAA                 >60                 11/19/2023                Musculoskeletal   Abdominal   Peds  Hematology negative hematology ROS (+) Lab Results      Component                Value               Date                      WBC                      10.1                11/19/2023                HGB                      12.6                11/19/2023                HCT                      38.8                11/19/2023                MCV                      97.2  11/19/2023                PLT                      216                 11/19/2023              Anesthesia Other Findings   Reproductive/Obstetrics                              Anesthesia Physical Anesthesia Plan  ASA: 3  Anesthesia Plan: General   Post-op Pain Management:    Induction: Intravenous  PONV Risk Score and Plan: 3 and Ondansetron and Dexamethasone  Airway Management Planned: LMA  Additional Equipment: None  Intra-op Plan:   Post-operative Plan: Extubation in OR  Informed Consent: I have reviewed the patients History and Physical, chart, labs and discussed the procedure including the risks,  benefits and alternatives for the proposed anesthesia with the patient or authorized representative who has indicated his/her understanding and acceptance.     Dental advisory given  Plan Discussed with: CRNA  Anesthesia Plan Comments:          Anesthesia Quick Evaluation

## 2023-11-20 NOTE — Anesthesia Procedure Notes (Signed)
 Procedure Name: LMA Insertion Date/Time: 11/20/2023 2:49 AM  Performed by: Mallary Kreger T, CRNAPre-anesthesia Checklist: Patient identified, Emergency Drugs available, Suction available and Patient being monitored Patient Re-evaluated:Patient Re-evaluated prior to induction Oxygen Delivery Method: Circle system utilized Preoxygenation: Pre-oxygenation with 100% oxygen Induction Type: IV induction Ventilation: Mask ventilation without difficulty LMA: LMA inserted LMA Size: 4.0 Number of attempts: 1 Placement Confirmation: positive ETCO2 and breath sounds checked- equal and bilateral Tube secured with: Tape Dental Injury: Teeth and Oropharynx as per pre-operative assessment

## 2023-11-20 NOTE — TOC Transition Note (Signed)
 Transition of Care [facility]) - Discharge Note   Patient Details  Name: Tiffany Salazar MRN: [id number] Date of Birth: 11-18-1980  Transition of Care [facility]) CM/SW Contact:  Tom-Johnson, Hershal Coria, RN Phone Number: 11/20/2023, 12:06 PM   Clinical Narrative:     Patient is scheduled for discharge today.  Readmission Risk Assessment done. Hospital f/u and discharge instructions on AVS. Prescriptions sent to [facility] pharmacy and patient will receive meds prior discharge. No TOC needs or recommendations noted. Family to transport at discharge.  No further TOC needs noted.            Final next level of care: Home/Self Care Barriers to Discharge: Barriers Resolved   Patient Goals and CMS Choice Patient states their goals for this hospitalization and ongoing recovery are:: To return home CMS Medicare.gov Compare Post Acute Care list provided to:: Patient Choice offered to / list presented to : NA      Discharge Placement                Patient to be transferred to facility by: Family      Discharge Plan and Services Additional resources added to the After Visit Summary for                  DME Arranged: N/A DME Agency: NA       HH Arranged: NA HH Agency: NA        Social Drivers of Health (SDOH) Interventions SDOH Screenings   Food Insecurity: No Food Insecurity (11/19/2023)  Housing: Low Risk  (11/19/2023)  Transportation Needs: No Transportation Needs (11/19/2023)  Utilities: Not At Risk (11/19/2023)  Depression (PHQ2-9): Medium Risk (11/18/2023)  Tobacco Use: High Risk (11/20/2023)     Readmission Risk Interventions    11/20/2023   12:05 PM  Readmission Risk Prevention Plan  Post Dischage Appt Complete  Medication Screening Complete  Transportation Screening Complete

## 2023-11-20 NOTE — Transfer of Care (Signed)
 Immediate Anesthesia Transfer of Care Note  Patient: Tiffany Salazar  Procedure(s) Performed: IRRIGATION AND DEBRIDEMENT WOUND (Right)  Patient Location: PACU  Anesthesia Type:General  Level of Consciousness: drowsy  Airway & Oxygen Therapy: Patient Spontanous Breathing and Patient connected to nasal cannula oxygen  Post-op Assessment: Report given to RN, Post -op Vital signs reviewed and stable, and Patient moving all extremities  Post vital signs: Reviewed and stable  Last Vitals:  Vitals Value Taken Time  BP 133/73 11/20/23 0340  Temp 36.7 C 11/20/23 0340  Pulse 40 11/20/23 0344  Resp 9 11/20/23 0344  SpO2 100 % 11/20/23 0344  Vitals shown include unfiled device data.  Last Pain:  Vitals:   11/20/23 0340  TempSrc:   PainSc: 0-No pain         Complications: No notable events documented.

## 2023-11-20 NOTE — Plan of Care (Signed)

## 2023-11-20 NOTE — Progress Notes (Signed)
 Postop note: Status post incision and drainage right small finger.  Gross purulence encountered.  Cultures taken.  Wound packed.  Start hydrotherapy in 2 to 4 days.  Okay for DVT prophylaxis.  Okay for discharge home when afebrile, white blood count trending to normal, pain controlled.

## 2023-11-20 NOTE — Progress Notes (Addendum)
 Report called to [employer].

## 2023-11-23 ENCOUNTER — Encounter (HOSPITAL_COMMUNITY): Payer: Self-pay | Admitting: Orthopedic Surgery

## 2023-11-24 ENCOUNTER — Encounter: Payer: Self-pay | Admitting: Family Medicine

## 2023-11-24 ENCOUNTER — Ambulatory Visit: Admitting: Family Medicine

## 2023-11-24 VITALS — BP 110/70 | HR 77 | Ht 68.0 in | Wt 153.4 lb

## 2023-11-24 DIAGNOSIS — L0390 Cellulitis, unspecified: Secondary | ICD-10-CM | POA: Diagnosis not present

## 2023-11-24 LAB — CULTURE, BLOOD (ROUTINE X 2)
Culture: NO GROWTH
Culture: NO GROWTH

## 2023-11-24 NOTE — Progress Notes (Unsigned)
    SUBJECTIVE:   CHIEF COMPLAINT / HPI:   TG is a 43yo F w/ hx of dyshidrotic eczema, Tricuspid valve replacement for history of endocarditis that pf hospital f/u.  -Admitted 3/27 for cellulitis of R hand, s/p I&D and IV antibx, was switched to PO linezolid for total 7 day antibx course.  - Still taking antibiotics - Pain is improving, no fevers. Feels ok. It is currently still bandaged  OBJECTIVE:   BP 110/70   Pulse 77   Ht 5\' 8"  (1.727 m)   Wt 153 lb 6.4 oz (69.6 kg)   SpO2 98%   BMI 23.32 kg/m   General: Alert, pleasant well-appearing woman. NAD. HEENT: NCAT. MMM. CV: RRR, no murmurs.  Resp: CTAB, no wheezing or crackles. Normal WOB on RA.  Abm: Soft, nontender, nondistended. BS present. Ext: Moves all ext spontaneously Skin: Erythema and edema on R lateral 5th finger much improved compared to prior image. Incision site on lateral side of R 5th finger appears dry, nonpurulent, with packing bandaging still in place.   ASSESSMENT/PLAN:   Assessment & Plan Cellulitis, unspecified cellulitis site Improving.  Pain is controlled and patient remains afebrile. Rebandaged outside of wound, but left inside packing bandaging.  - Continue linezolid - Advised to call orthopedic surgeon to confirm follow-up appointments and to get packing changed. (Provided contact info) -Return precautions advised    Lincoln Brigham, MD [facility]

## 2023-11-24 NOTE — Patient Instructions (Signed)
 Good to see you today - Thank you for coming in  Things we discussed today:  1) Your hand infection is looking much better today! - Please continue taking your linezolid antibiotic untily ou finish the entire antibiotic course. This will make sure the infection is killed.  2) Please call your surgeon to make an appointment as soon as possible. Their office will have material to repack the wound too.   Betha Loa MD  [facility] [address], [place] [address] Directions [phone]   Come back in 1 week to follow-up on your hand if you aren't able to see your surgeon before then.

## 2023-11-25 LAB — AEROBIC/ANAEROBIC CULTURE W GRAM STAIN (SURGICAL/DEEP WOUND)

## 2023-11-25 NOTE — Anesthesia Postprocedure Evaluation (Signed)
 Anesthesia Post Note  Patient: Tiffany Salazar  Procedure(s) Performed: IRRIGATION AND DEBRIDEMENT WOUND (Right)     Patient location during evaluation: PACU Anesthesia Type: General Level of consciousness: awake and alert Pain management: pain level controlled Vital Signs Assessment: post-procedure vital signs reviewed and stable Respiratory status: spontaneous breathing, nonlabored ventilation and respiratory function stable Cardiovascular status: blood pressure returned to baseline and stable Postop Assessment: no apparent nausea or vomiting Anesthetic complications: no   No notable events documented.                 Axel Meas

## 2023-11-30 ENCOUNTER — Other Ambulatory Visit: Payer: Self-pay

## 2023-11-30 DIAGNOSIS — L02511 Cutaneous abscess of right hand: Secondary | ICD-10-CM | POA: Diagnosis not present

## 2023-11-30 NOTE — Telephone Encounter (Signed)
 Pharmacy is requesting the specific directions on Triamcinolone cream, says there are two different directions on script. Please make the corrects and send medication to [facility] located on [address], Thanks! Penni Bombard CMA

## 2023-12-01 ENCOUNTER — Other Ambulatory Visit (HOSPITAL_COMMUNITY): Payer: Self-pay

## 2023-12-01 DIAGNOSIS — L02511 Cutaneous abscess of right hand: Secondary | ICD-10-CM | POA: Diagnosis not present

## 2023-12-01 MED ORDER — TRIAMCINOLONE ACETONIDE 0.5 % EX OINT: TOPICAL_OINTMENT | CUTANEOUS | 0 refills | Status: AC

## 2023-12-09 ENCOUNTER — Other Ambulatory Visit (HOSPITAL_COMMUNITY): Payer: Self-pay

## 2023-12-09 MED ORDER — AMPHETAMINE-DEXTROAMPHET ER 30 MG PO CP24
30.0000 mg | ORAL_CAPSULE | Freq: Two times a day (BID) | ORAL | 0 refills | Status: AC
  Filled 2023-12-09: qty 60, 30d supply, fill #0

## 2023-12-11 ENCOUNTER — Other Ambulatory Visit (HOSPITAL_COMMUNITY): Payer: Self-pay

## 2023-12-17 ENCOUNTER — Telehealth: Payer: Self-pay | Admitting: Podiatry

## 2023-12-17 NOTE — Telephone Encounter (Signed)
 DOS:  01/21/24  TENOTOMY 5TH [id number]  CAPSULOTOMY MPJ RELEASE 5TH [id number]  EFFECTIVE DATE: 07/25/2021 - 06/24/2024   PER CARMELITA S NO PRIOR AUTH IS REQ FOR CPT CODES 91478, [id number]  REF# [id number]

## 2023-12-18 ENCOUNTER — Other Ambulatory Visit (HOSPITAL_COMMUNITY): Payer: Self-pay

## 2023-12-18 DIAGNOSIS — F900 Attention-deficit hyperactivity disorder, predominantly inattentive type: Secondary | ICD-10-CM | POA: Diagnosis not present

## 2023-12-18 MED ORDER — AMPHETAMINE-DEXTROAMPHET ER 30 MG PO CP24
30.0000 mg | ORAL_CAPSULE | Freq: Two times a day (BID) | ORAL | 0 refills | Status: AC
  Filled 2024-01-12: qty 60, 30d supply, fill #0

## 2023-12-18 MED ORDER — AMPHETAMINE-DEXTROAMPHET ER 30 MG PO CP24
30.0000 mg | ORAL_CAPSULE | Freq: Two times a day (BID) | ORAL | 0 refills | Status: AC
  Filled 2024-02-11: qty 60, 30d supply, fill #0

## 2023-12-18 MED ORDER — AMPHETAMINE-DEXTROAMPHET ER 30 MG PO CP24
30.0000 mg | ORAL_CAPSULE | Freq: Two times a day (BID) | ORAL | 0 refills | Status: AC
  Filled 2024-05-27: qty 60, 30d supply, fill #0

## 2023-12-22 ENCOUNTER — Ambulatory Visit
Admit: 2023-12-22 | Discharge: 2023-12-22 | Disposition: A | Discharge status: Home / Self Care | Attending: Family Medicine | Admitting: Family Medicine

## 2023-12-22 DIAGNOSIS — R921 Mammographic calcification found on diagnostic imaging of breast: Secondary | ICD-10-CM | POA: Diagnosis not present

## 2023-12-23 ENCOUNTER — Other Ambulatory Visit: Payer: Self-pay | Admitting: Family Medicine

## 2023-12-23 DIAGNOSIS — R921 Mammographic calcification found on diagnostic imaging of breast: Secondary | ICD-10-CM

## 2023-12-24 ENCOUNTER — Encounter: Payer: Self-pay | Admitting: Family Medicine

## 2023-12-24 NOTE — Progress Notes (Deleted)
    SUBJECTIVE:   CHIEF COMPLAINT: follow up  HPI:   Tiffany Salazar is a 43 y.o.  with history notable for recent hand cellulitis/abscess presenting for follow up on weight loss. She reports ***. Aaron Aas   PERTINENT  PMH / PSH/Family/Social History : ***  OBJECTIVE:   There were no vitals taken for this visit.  Today's weight:  Review of prior weights: There were no vitals filed for this visit.   Cardiac: Regular rate and rhythm. Normal S1/S2. No murmurs, rubs, or gallops appreciated. Lungs: Clear bilaterally to ascultation.  Abdomen: Normoactive bowel sounds. No tenderness to deep or light palpation. No rebound or guarding.  ***  Psych: Pleasant and appropriate    ASSESSMENT/PLAN:   Assessment & Plan    Otho Blitz, MD  Family Medicine Teaching Service  [facility]

## 2023-12-25 ENCOUNTER — Ambulatory Visit: Admitting: Family Medicine

## 2023-12-25 DIAGNOSIS — R918 Other nonspecific abnormal finding of lung field: Secondary | ICD-10-CM

## 2023-12-25 DIAGNOSIS — R634 Abnormal weight loss: Secondary | ICD-10-CM

## 2024-01-04 ENCOUNTER — Other Ambulatory Visit (HOSPITAL_COMMUNITY): Payer: Self-pay

## 2024-01-11 ENCOUNTER — Telehealth: Payer: Self-pay | Admitting: Podiatry

## 2024-01-11 NOTE — Progress Notes (Deleted)
 Chief Complaint: Primary GI MD: [name]  HPI: 43 year old female history of hepatitis C, IV drug use, septic pulmonary emboli (2019), subdural hematoma (2015), endocarditis s/p tricuspid valve replacement,   Discussed the use of AI scribe software for clinical note transcription with the patient, who gave verbal consent to proceed.  History of Present Illness      PREVIOUS GI WORKUP     Past Medical History:  Diagnosis Date   AMA (advanced maternal age) multigravida 35+, unspecified trimester 09/20/2018   Anemia    Asthma    seasonal   Eczema of both hands 06/03/2021   Hepatitis C    Hep C   MRSA infection 03/21/2015   Pregnancy complicated by subutex  maintenance, antepartum (HCC) 10/20/2018   Shortness of breath 04/29/2023   Subdural hematoma (HCC) 12/26/2013   Substance use disorder    Tricuspid valve replaced    Unwanted fertility 02/09/2019   Signed BTL papers    Past Surgical History:  Procedure Laterality Date   BREAST BIOPSY Right 05/22/2023   MM RT BREAST BX W LOC DEV EA AD LESION IMG BX SPEC STEREO GUIDE 05/22/2023 [facility]   BREAST BIOPSY Right 05/22/2023   MM RT BREAST BX W LOC DEV 1ST LESION IMAGE BX SPEC STEREO GUIDE 05/22/2023 [facility]   BUNIONECTOMY Bilateral    BUNIONECTOMY Right 01/09/2021   Procedure: TAILOR's BUNIONECTOMY RIGHT;  Surgeon: Camilo Cella, DPM;  Location: [facility];  Service: Podiatry;  Laterality: Right;   CARDIAC SURGERY     CESAREAN SECTION  x 3   CESAREAN SECTION WITH BILATERAL TUBAL LIGATION Bilateral 03/29/2019   Procedure: CESAREAN SECTION WITH BILATERAL TUBAL LIGATION;  Surgeon: Ana Balling, MD;  Location: [facility];  Service: Obstetrics;  Laterality: Bilateral;   INCISION AND DRAINAGE OF WOUND Right 11/20/2023   Procedure: IRRIGATION AND DEBRIDEMENT WOUND;  Surgeon: Brunilda Capra, MD;  Location: [facility];  Service: Orthopedics;  Laterality: Right;  IRRIGATION AND DEBRIDEMENT RIGHT HAND   TEE WITHOUT  CARDIOVERSION N/A 03/23/2018   Procedure: TRANSESOPHAGEAL ECHOCARDIOGRAM (TEE);  Surgeon: Hazle Lites, MD;  [facility];  Service: Cardiovascular;  Laterality: N/A;   TEE WITHOUT CARDIOVERSION N/A 03/29/2018   Procedure: TRANSESOPHAGEAL ECHOCARDIOGRAM (TEE);  Surgeon: Bartley Lightning, MD;  Location: [facility] OR;  Service: Open Heart Surgery;  Laterality: N/A;   TRICUSPID VALVE REPLACEMENT N/A 03/29/2018   Procedure: TRICUSPID VALVE REPLACEMENT Using 27 mm Magna Ease Mitral Valve Pericardial Bioprosthesis;  Surgeon: Bartley Lightning, MD;  Location: [facility] OR;  Service: Open Heart Surgery;  Laterality: N/A;   TUBAL LIGATION      Current Outpatient Medications  Medication Sig Dispense Refill   albuterol  (VENTOLIN  HFA) 108 (90 Base) MCG/ACT inhaler Use 1-2 puffs as needed for wheezing 3 times a day 18 g 3   amphetamine -dextroamphetamine  (ADDERALL XR) 30 MG 24 hr capsule Take 1 capsule (30 mg total) by mouth 2 (two) times daily. 60 capsule 0   [START ON 03/07/2024] amphetamine -dextroamphetamine  (ADDERALL XR) 30 MG 24 hr capsule Take 1 capsule (30 mg total) by mouth 2 (two) times daily. 60 capsule 0   [START ON 02/06/2024] amphetamine -dextroamphetamine  (ADDERALL XR) 30 MG 24 hr capsule Take 1 capsule (30 mg total) by mouth 2 (two) times daily. 60 capsule 0   amphetamine -dextroamphetamine  (ADDERALL XR) 30 MG 24 hr capsule Take 1 capsule (30 mg total) by mouth 2 (two) times daily. 60 capsule 0   Biotin w/ Vitamins C & E (HAIR SKIN &  NAILS GUMMIES PO) Take 1 each by mouth in the morning.     buprenorphine  (SUBUTEX ) 8 MG SUBL SL tablet Place 8 mg under the tongue in the morning.     clobetasol  ointment (TEMOVATE ) 0.05 % Apply 1 Application topically 2 (two) times daily. Then use twice weekly thereafter (Patient taking differently: Apply 1 Application topically 2 (two) times daily.) 60 g 2   ibuprofen  (ADVIL ) 200 MG tablet Take 400 mg by mouth as needed for fever, headache, mild pain (pain score 1-3) or  moderate pain (pain score 4-6).     Multiple Vitamin (MULTIVITAMIN WITH MINERALS) TABS tablet Take 1 tablet by mouth in the morning.     ondansetron  (ZOFRAN ) 4 MG tablet Take 1 tablet (4 mg total) by mouth every 8 (eight) hours as needed for nausea or vomiting. 15 tablet 1   triamcinolone  ointment (KENALOG ) 0.5 % Apply twice weekly to affected area 30 g 0   varenicline  (CHANTIX  CONTINUING MONTH PAK) 1 MG tablet Take 1 tablet (1 mg total) by mouth 2 (two) times daily. 60 tablet 1   No current facility-administered medications for this visit.    Allergies as of 01/12/2024 - Review Complete 11/24/2023  Allergen Reaction Noted   Gentamicin Rash 03/20/2018   Vancomycin  Rash 03/20/2018    Family History  Problem Relation Age of Onset   Thyroid  disease Mother    Breast cancer Mother 79       breast   Alcohol abuse Father     Social History   Socioeconomic History   Marital status: Single    Spouse name: Not on file   Number of children: Not on file   Years of education: Not on file   Highest education level: Not on file  Occupational History   Occupation: unemployed  Tobacco Use   Smoking status: Every Day    Current packs/day: 1.00    Types: E-cigarettes, Cigarettes   Smokeless tobacco: Current   Tobacco comments:    quit smoking cigarretted; vape  Vaping Use   Vaping status: Some Days  Substance and Sexual Activity   Alcohol use: Not Currently    Alcohol/week: 0.0 standard drinks of alcohol    Comment: Pt stated she has started goint ot rehab clinic   Drug use: Not Currently    Types: IV, Cocaine , Heroin, Marijuana    Comment: March 20, 2018 last time used drugs. subutex    Sexual activity: Yes    Partners: Female, Female    Birth control/protection: Surgical    Comment: offered condoms; declined 06/2019  Other Topics Concern   Not on file  Social History Narrative   The patient has 3 daughters.  Her oldest daughter was born in 2003 cesarean delivery.  Her middle  daughter was born in 2012.  Her youngest daughter was born in the fall 2018.      She is currently living with her boyfriend whose name is Sylvester Evert.  Reports her and Sylvester Evert recently got engaged as a fall 2019.   Social Drivers of Corporate investment banker Strain: Not on file  Food Insecurity: Low Risk  (12/01/2023)   Received from [facility]   Hunger Vital Sign    Worried About Running Out of Food in the Last Year: Never true    Ran Out of Food in the Last Year: Never true  Transportation Needs: No Transportation Needs (12/01/2023)   Received from [facility]    In the past 12 months,  has lack of reliable transportation kept you from medical appointments, meetings, work or from getting things needed for daily living? : No  Physical Activity: Not on file  Stress: Not on file  Social Connections: Not on file  Intimate Partner Violence: Not At Risk (11/19/2023)   Humiliation, Afraid, Rape, and Kick questionnaire    Fear of Current or Ex-Partner: No    Emotionally Abused: No    Physically Abused: No    Sexually Abused: No    Review of Systems:    Constitutional: No weight loss, fever, chills, weakness or fatigue HEENT: Eyes: No change in vision               Ears, Nose, Throat:  No change in hearing or congestion Skin: No rash or itching Cardiovascular: No chest pain, chest pressure or palpitations   Respiratory: No SOB or cough Gastrointestinal: See HPI and otherwise negative Genitourinary: No dysuria or change in urinary frequency Neurological: No headache, dizziness or syncope Musculoskeletal: No new muscle or joint pain Hematologic: No bleeding or bruising Psychiatric: No history of depression or anxiety    Physical Exam:  Vital signs: There were no vitals taken for this visit.  Constitutional: NAD, alert and cooperative Head:  Normocephalic and atraumatic. Eyes:   PEERL, EOMI. No icterus. Conjunctiva pink. Respiratory: Respirations even and unlabored.  Lungs clear to auscultation bilaterally.   No wheezes, crackles, or rhonchi.  Cardiovascular:  Regular rate and rhythm. No peripheral edema, cyanosis or pallor.  Gastrointestinal:  Soft, nondistended, nontender. No rebound or guarding. Normal bowel sounds. No appreciable masses or hepatomegaly. Rectal:  Declines Msk:  Symmetrical without gross deformities. Without edema, no deformity or joint abnormality.  Neurologic:  Alert and  oriented x4;  grossly normal neurologically.  Skin:   Dry and intact without significant lesions or rashes. Psychiatric: Oriented to person, place and time. Demonstrates good judgement and reason without abnormal affect or behaviors.  Physical Exam    RELEVANT LABS AND IMAGING: CBC    Component Value Date/Time   WBC 7.8 11/20/2023 0446   RBC 3.97 11/20/2023 0446   HGB 12.7 11/20/2023 0446   HGB 13.6 10/20/2023 1004   HCT 38.6 11/20/2023 0446   HCT 40.5 10/20/2023 1004   PLT 217 11/20/2023 0446   PLT 205 10/20/2023 1004   MCV 97.2 11/20/2023 0446   MCV 95 10/20/2023 1004   MCV 99 12/26/2013 0416   MCH 32.0 11/20/2023 0446   MCHC 32.9 11/20/2023 0446   RDW 13.2 11/20/2023 0446   RDW 11.9 10/20/2023 1004   RDW 14.6 (H) 12/26/2013 0416   LYMPHSABS 3.3 11/20/2023 0446   LYMPHSABS 2.8 06/22/2020 1339   MONOABS 0.5 11/20/2023 0446   EOSABS 0.2 11/20/2023 0446   EOSABS 0.4 06/22/2020 1339   BASOSABS 0.1 11/20/2023 0446   BASOSABS 0.1 06/22/2020 1339    CMP     Component Value Date/Time   NA 142 11/20/2023 0446   NA 139 10/20/2023 1004   NA 141 12/26/2013 0416   K 4.2 11/20/2023 0446   K 4.0 12/26/2013 0416   CL 108 11/20/2023 0446   CL 108 (H) 12/26/2013 0416   CO2 24 11/20/2023 0446   CO2 28 12/26/2013 0416   GLUCOSE 90 11/20/2023 0446   GLUCOSE 116 (H) 12/26/2013 0416   BUN 19 11/20/2023 0446   BUN 16 10/20/2023 1004   BUN 16 12/26/2013 0416   CREATININE 0.89 11/20/2023 0446   CREATININE 0.95 08/30/2019 1701   CALCIUM  8.2 (  L) 11/20/2023  0446   CALCIUM  8.6 12/26/2013 0416   PROT 5.8 (L) 11/19/2023 1826   PROT 7.0 10/20/2023 1004   PROT 6.9 12/26/2013 0416   ALBUMIN  3.3 (L) 11/19/2023 1826   ALBUMIN  4.4 10/20/2023 1004   ALBUMIN  3.5 12/26/2013 0416   AST 20 11/19/2023 1826   AST 39 (H) 12/26/2013 0416   ALT 14 11/19/2023 1826   ALT LAVTOP 04/06/2018 1800   ALKPHOS 46 11/19/2023 1826   ALKPHOS 74 12/26/2013 0416   BILITOT 0.6 11/19/2023 1826   BILITOT 0.5 10/20/2023 1004   BILITOT 0.2 12/26/2013 0416   GFRNONAA >60 11/20/2023 0446   GFRNONAA >60 12/26/2013 0416   GFRAA 100 06/22/2020 1339   GFRAA >60 12/26/2013 0416     Assessment/Plan:   Assessment and Plan Assessment & Plan        Gigi [facility] 01/11/2024, 8:40 PM  Cc: Azell Boll, MD

## 2024-01-11 NOTE — Telephone Encounter (Signed)
 Pt called surgery center and needed to r/s surgery that was scheduled for 5/29. I called pt and we discusses surgery and she asked how long would she need to be out of work since the surgery is on her little toes.  I consulted with Dr Luster Salters and he said depends on the job. If its a job she works from home with she could probably just take the weekend off. If its a job she is on her feet a lot( 8 hrs a day) she would probably need about 2 wks. She will be in surgical shoes.   I called pt back and explained the time needed to be out of work and she has rescheduled for 6/12 and I did send the message to the surgery center to r/s the surgery.

## 2024-01-12 ENCOUNTER — Other Ambulatory Visit (HOSPITAL_COMMUNITY): Payer: Self-pay

## 2024-01-12 ENCOUNTER — Ambulatory Visit: Admitting: Gastroenterology

## 2024-01-27 ENCOUNTER — Encounter: Admitting: Podiatry

## 2024-02-03 ENCOUNTER — Encounter: Admitting: Podiatry

## 2024-02-04 ENCOUNTER — Other Ambulatory Visit: Payer: Self-pay | Admitting: Podiatry

## 2024-02-04 DIAGNOSIS — M7752 Other enthesopathy of left foot: Secondary | ICD-10-CM | POA: Diagnosis not present

## 2024-02-04 DIAGNOSIS — M24574 Contracture, right foot: Secondary | ICD-10-CM | POA: Diagnosis not present

## 2024-02-04 DIAGNOSIS — M7751 Other enthesopathy of right foot: Secondary | ICD-10-CM | POA: Diagnosis not present

## 2024-02-04 DIAGNOSIS — M2041 Other hammer toe(s) (acquired), right foot: Secondary | ICD-10-CM | POA: Diagnosis not present

## 2024-02-04 DIAGNOSIS — M2042 Other hammer toe(s) (acquired), left foot: Secondary | ICD-10-CM | POA: Diagnosis not present

## 2024-02-04 DIAGNOSIS — M24575 Contracture, left foot: Secondary | ICD-10-CM | POA: Diagnosis not present

## 2024-02-04 MED ORDER — OXYCODONE-ACETAMINOPHEN 5-325 MG PO TABS: 1.0000 | ORAL_TABLET | ORAL | 0 refills | Status: AC | PRN

## 2024-02-04 MED ORDER — IBUPROFEN 800 MG PO TABS: 800.0000 mg | ORAL_TABLET | Freq: Three times a day (TID) | ORAL | 1 refills | Status: AC

## 2024-02-04 NOTE — Progress Notes (Signed)
 PRN postop

## 2024-02-05 ENCOUNTER — Other Ambulatory Visit (HOSPITAL_COMMUNITY): Payer: Self-pay

## 2024-02-10 ENCOUNTER — Ambulatory Visit (INDEPENDENT_AMBULATORY_CARE_PROVIDER_SITE_OTHER): Admitting: Podiatry

## 2024-02-10 ENCOUNTER — Ambulatory Visit (INDEPENDENT_AMBULATORY_CARE_PROVIDER_SITE_OTHER)

## 2024-02-10 DIAGNOSIS — Z9889 Other specified postprocedural states: Secondary | ICD-10-CM

## 2024-02-10 DIAGNOSIS — M7752 Other enthesopathy of left foot: Secondary | ICD-10-CM

## 2024-02-10 DIAGNOSIS — M7751 Other enthesopathy of right foot: Secondary | ICD-10-CM

## 2024-02-10 NOTE — Progress Notes (Signed)
 Subjective:  Patient ID: Tiffany Salazar, female    DOB: 1980/12/29,  MRN: [id number]  Chief Complaint  Patient presents with   Routine Post Op    POV # 1 DOS 02/04/24 --- EXTENSOR TENOTOMY 5TH DIGIT BILAT, 5TH TOE JOINT CAPSULOTOMY BILAT Pt stated that she is doing well she stated that she has some soreness but no major pain    DOS: 02/04/2024 Procedure: Bilateral fifth digit arthroplasty with extensor tenotomy  43 y.o. female returns for post-op check.  Patient states that she is doing okay.  Pain is controlled bandages clean dry and intact.  Ambulating with bilateral surgical shoe.  Review of Systems: Negative except as noted in the HPI. Denies N/V/F/Ch.  Past Medical History:  Diagnosis Date   AMA (advanced maternal age) multigravida 35+, unspecified trimester 09/20/2018   Anemia    Asthma    seasonal   Eczema of both hands 06/03/2021   Hepatitis C    Hep C   MRSA infection 03/21/2015   Pregnancy complicated by subutex  maintenance, antepartum (HCC) 10/20/2018   Shortness of breath 04/29/2023   Subdural hematoma (HCC) 12/26/2013   Substance use disorder    Tricuspid valve replaced    Unwanted fertility 02/09/2019   Signed BTL papers    Current Outpatient Medications:    linezolid  (ZYVOX ) 600 MG tablet, , Disp: , Rfl:    albuterol  (VENTOLIN  HFA) 108 (90 Base) MCG/ACT inhaler, Use 1-2 puffs as needed for wheezing 3 times a day, Disp: 18 g, Rfl: 3   amphetamine -dextroamphetamine  (ADDERALL XR) 30 MG 24 hr capsule, Take 1 capsule (30 mg total) by mouth 2 (two) times daily., Disp: 60 capsule, Rfl: 0   [START ON 03/07/2024] amphetamine -dextroamphetamine  (ADDERALL XR) 30 MG 24 hr capsule, Take 1 capsule (30 mg total) by mouth 2 (two) times daily., Disp: 60 capsule, Rfl: 0   amphetamine -dextroamphetamine  (ADDERALL XR) 30 MG 24 hr capsule, Take 1 capsule (30 mg total) by mouth 2 (two) times daily., Disp: 60 capsule, Rfl: 0   amphetamine -dextroamphetamine  (ADDERALL XR) 30 MG 24 hr  capsule, Take 1 capsule (30 mg total) by mouth 2 (two) times daily., Disp: 60 capsule, Rfl: 0   Biotin w/ Vitamins C & E (HAIR SKIN & NAILS GUMMIES PO), Take 1 each by mouth in the morning., Disp: , Rfl:    buprenorphine  (SUBUTEX ) 8 MG SUBL SL tablet, Place 8 mg under the tongue in the morning., Disp: , Rfl:    clobetasol  ointment (TEMOVATE ) 0.05 %, Apply 1 Application topically 2 (two) times daily. Then use twice weekly thereafter (Patient taking differently: Apply 1 Application topically 2 (two) times daily.), Disp: 60 g, Rfl: 2   ibuprofen  (ADVIL ) 800 MG tablet, Take 1 tablet (800 mg total) by mouth 3 (three) times daily., Disp: 60 tablet, Rfl: 1   Multiple Vitamin (MULTIVITAMIN WITH MINERALS) TABS tablet, Take 1 tablet by mouth in the morning., Disp: , Rfl:    ondansetron  (ZOFRAN ) 4 MG tablet, Take 1 tablet (4 mg total) by mouth every 8 (eight) hours as needed for nausea or vomiting., Disp: 15 tablet, Rfl: 1   oxyCODONE -acetaminophen  (PERCOCET) 5-325 MG tablet, Take 1 tablet by mouth every 4 (four) hours as needed for severe pain (pain score 7-10)., Disp: 30 tablet, Rfl: 0   triamcinolone  ointment (KENALOG ) 0.5 %, Apply twice weekly to affected area, Disp: 30 g, Rfl: 0   varenicline  (CHANTIX  CONTINUING MONTH PAK) 1 MG tablet, Take 1 tablet (1 mg total) by mouth 2 (two) times  daily., Disp: 60 tablet, Rfl: 1  Social History   Tobacco Use  Smoking Status Every Day   Current packs/day: 1.00   Types: E-cigarettes, Cigarettes  Smokeless Tobacco Current  Tobacco Comments   quit smoking cigarretted; vape    Allergies  Allergen Reactions   Gentamicin Rash    Red man syndrome    Vancomycin  Rash    Red man syndrome   Objective:  There were no vitals filed for this visit. There is no height or weight on file to calculate BMI. Constitutional Well developed. Well nourished.  Vascular Foot warm and well perfused. Capillary refill normal to all digits.   Neurologic Normal speech. Oriented  to person, place, and time. Epicritic sensation to light touch grossly present bilaterally.  Dermatologic Skin healing well without signs of infection. Skin edges well coapted without signs of infection.  Orthopedic: Tenderness to palpation noted about the surgical site.   Radiographs: Previous discussed rheumatoid of bilateral foot: Good correction alignment noted.  Reduction of deformity noted.  No other abnormalities identified hardware is intact no backing out or loosening noted Assessment:   1. Capsulitis of metatarsophalangeal (MTP) joint of left foot   2. Capsulitis of metatarsophalangeal (MTP) joint of right foot   3. Status post foot surgery    Plan:  Patient was evaluated and treated and all questions answered.  S/p foot surgery bilaterally -Progressing as expected post-operatively. -XR: See above -WB Status: Weightbearing as tolerated in surgical shoe -Sutures: Intact.  No clinical signs of dehiscence noted no complication noted. -Medications: None -Foot redressed.  No follow-ups on file.

## 2024-02-11 ENCOUNTER — Other Ambulatory Visit (HOSPITAL_COMMUNITY): Payer: Self-pay

## 2024-02-14 ENCOUNTER — Encounter: Payer: Self-pay | Admitting: Podiatry

## 2024-02-14 DIAGNOSIS — Z9889 Other specified postprocedural states: Secondary | ICD-10-CM

## 2024-02-14 MED ORDER — AMOXICILLIN-POT CLAVULANATE 875-125 MG PO TABS: 1.0000 | ORAL_TABLET | Freq: Two times a day (BID) | ORAL | 0 refills | Status: AC

## 2024-02-14 NOTE — Telephone Encounter (Signed)
 Patient contacted answering service stating bandage got wet.  She denies signs of infection, she does report that she has history of heart procedure which requires prophylactic antibiotics with various procedures therefore sending 5 days Augmentin out of abundance of caution.  She does have some concern that the pins in her toes may have changed position.  Instructed the patient to not attempt to pull or push the pins.  She may paint them with Betadine that she has any concern for infection with this as well.  She is scheduled to follow-up with Dr. Janit on 02/17/2024.

## 2024-02-17 ENCOUNTER — Encounter: Payer: Self-pay | Admitting: Podiatry

## 2024-02-17 ENCOUNTER — Ambulatory Visit (INDEPENDENT_AMBULATORY_CARE_PROVIDER_SITE_OTHER): Admitting: Podiatry

## 2024-02-17 VITALS — Ht 68.0 in | Wt 153.4 lb

## 2024-02-17 DIAGNOSIS — M2041 Other hammer toe(s) (acquired), right foot: Secondary | ICD-10-CM

## 2024-02-17 DIAGNOSIS — M2042 Other hammer toe(s) (acquired), left foot: Secondary | ICD-10-CM

## 2024-02-17 NOTE — Progress Notes (Signed)
 Chief Complaint  Patient presents with   Routine Post Op    POV # 2 DOS 02/04/24 --- EXTENSOR TENOTOMY 5TH DIGIT BILAT, 5TH TOE JOINT CAPSULOTOMY BILAT, pt is doing well still some pain, thinks the pin on the right foot is coming out.    Subjective:  Patient presents today status post fifth MTP capsulotomy with extensor tenotomy bilateral.  DOS: 02/04/2024.  Doing well.  WBAT surgical shoe as instructed  Past Medical History:  Diagnosis Date   AMA (advanced maternal age) multigravida 35+, unspecified trimester 09/20/2018   Anemia    Asthma    seasonal   Eczema of both hands 06/03/2021   Hepatitis C    Hep C   MRSA infection 03/21/2015   Pregnancy complicated by subutex  maintenance, antepartum (HCC) 10/20/2018   Shortness of breath 04/29/2023   Subdural hematoma (HCC) 12/26/2013   Substance use disorder    Tricuspid valve replaced    Unwanted fertility 02/09/2019   Signed BTL papers    Past Surgical History:  Procedure Laterality Date   BREAST BIOPSY Right 05/22/2023   MM RT BREAST BX W LOC DEV EA AD LESION IMG BX SPEC STEREO GUIDE 05/22/2023 [facility]   BREAST BIOPSY Right 05/22/2023   MM RT BREAST BX W LOC DEV 1ST LESION IMAGE BX SPEC STEREO GUIDE 05/22/2023 [facility]   BUNIONECTOMY Bilateral    BUNIONECTOMY Right 01/09/2021   Procedure: TAILOR's BUNIONECTOMY RIGHT;  Surgeon: Gretel Ozell PARAS, DPM;  Location: [facility];  Service: Podiatry;  Laterality: Right;   CARDIAC SURGERY     CESAREAN SECTION  x 3   CESAREAN SECTION WITH BILATERAL TUBAL LIGATION Bilateral 03/29/2019   Procedure: CESAREAN SECTION WITH BILATERAL TUBAL LIGATION;  Surgeon: Starla Harland BROCKS, MD;  Location: [facility];  Service: Obstetrics;  Laterality: Bilateral;   INCISION AND DRAINAGE OF WOUND Right 11/20/2023   Procedure: IRRIGATION AND DEBRIDEMENT WOUND;  Surgeon: Murrell Drivers, MD;  Location: [facility];  Service: Orthopedics;  Laterality: Right;  IRRIGATION AND DEBRIDEMENT RIGHT HAND   TEE WITHOUT  CARDIOVERSION N/A 03/23/2018   Procedure: TRANSESOPHAGEAL ECHOCARDIOGRAM (TEE);  Surgeon: Mona Vinie BROCKS, MD;  Location: [facility] ENDOSCOPY;  Service: Cardiovascular;  Laterality: N/A;   TEE WITHOUT CARDIOVERSION N/A 03/29/2018   Procedure: TRANSESOPHAGEAL ECHOCARDIOGRAM (TEE);  Surgeon: Lucas Dorise POUR, MD;  Location: [facility] OR;  Service: Open Heart Surgery;  Laterality: N/A;   TRICUSPID VALVE REPLACEMENT N/A 03/29/2018   Procedure: TRICUSPID VALVE REPLACEMENT Using 27 mm Magna Ease Mitral Valve Pericardial Bioprosthesis;  Surgeon: Lucas Dorise POUR, MD;  Location: [facility] OR;  Service: Open Heart Surgery;  Laterality: N/A;   TUBAL LIGATION      Allergies  Allergen Reactions   Gentamicin Rash    Red man syndrome    Vancomycin  Rash    Red man syndrome    Objective/Physical Exam Neurovascular status intact.  Incision well coapted with sutures intact.  There is some edema with very mild localized erythema around the fifth toe of the right foot.   Assessment: 1. s/p extensor tenotomy with MTP capsulotomy fifth digits bilateral. DOS: 02/04/2024   Plan of Care:  -Patient was evaluated. X-rays reviewed that were taken 02/10/2024 - Percutaneous fixation pins were removed today -Continue WBAT surgical shoe -Betadine provided to apply to the incision sites daily with a Band-Aid -Patient was prescribed oral antibiotics by physician on-call.  She has not began taking them yet.  Take as prescribed -Return to clinic 1 week suture removal  Thresa EMERSON Sar, DPM [facility]  Dr. Thresa EMERSON Sar, DPM    [address] [place], [place] [address]                Office [phone]  Fax [phone]

## 2024-02-19 ENCOUNTER — Other Ambulatory Visit (HOSPITAL_COMMUNITY): Payer: Self-pay

## 2024-03-02 ENCOUNTER — Ambulatory Visit (INDEPENDENT_AMBULATORY_CARE_PROVIDER_SITE_OTHER)

## 2024-03-02 ENCOUNTER — Other Ambulatory Visit: Payer: Self-pay | Admitting: Podiatry

## 2024-03-02 ENCOUNTER — Ambulatory Visit (INDEPENDENT_AMBULATORY_CARE_PROVIDER_SITE_OTHER): Admitting: Podiatry

## 2024-03-02 DIAGNOSIS — M7752 Other enthesopathy of left foot: Secondary | ICD-10-CM

## 2024-03-02 DIAGNOSIS — M7751 Other enthesopathy of right foot: Secondary | ICD-10-CM | POA: Diagnosis not present

## 2024-03-02 NOTE — Progress Notes (Signed)
   Chief Complaint  Patient presents with   Routine Post Op    POV # 3 DOS 02/04/24 --- EXTENSOR TENOTOMY 5TH DIGIT BILAT, 5TH TOE JOINT CAPSULOTOMY BILAT Pt stated that she is doing well she stated that the shoe is rubbing her ankle     Subjective:  Patient presents today status post fifth MTP capsulotomy with extensor tenotomy bilateral.  DOS: 02/04/2024.  Doing well.  WBAT surgical shoe as instructed  Past Medical History:  Diagnosis Date   AMA (advanced maternal age) multigravida 35+, unspecified trimester 09/20/2018   Anemia    Asthma    seasonal   Eczema of both hands 06/03/2021   Hepatitis C    Hep C   MRSA infection 03/21/2015   Pregnancy complicated by subutex  maintenance, antepartum (HCC) 10/20/2018   Shortness of breath 04/29/2023   Subdural hematoma (HCC) 12/26/2013   Substance use disorder    Tricuspid valve replaced    Unwanted fertility 02/09/2019   Signed BTL papers    Past Surgical History:  Procedure Laterality Date   BREAST BIOPSY Right 05/22/2023   MM RT BREAST BX W LOC DEV EA AD LESION IMG BX SPEC STEREO GUIDE 05/22/2023 [facility]   BREAST BIOPSY Right 05/22/2023   MM RT BREAST BX W LOC DEV 1ST LESION IMAGE BX SPEC STEREO GUIDE 05/22/2023 [facility]   BUNIONECTOMY Bilateral    BUNIONECTOMY Right 01/09/2021   Procedure: TAILOR's BUNIONECTOMY RIGHT;  Surgeon: Gretel Ozell PARAS, DPM;  Location: [facility];  Service: Podiatry;  Laterality: Right;   CARDIAC SURGERY     CESAREAN SECTION  x 3   CESAREAN SECTION WITH BILATERAL TUBAL LIGATION Bilateral 03/29/2019   Procedure: CESAREAN SECTION WITH BILATERAL TUBAL LIGATION;  Surgeon: Starla Harland BROCKS, MD;  [facility]: [facility];  Service: Obstetrics;  Laterality: Bilateral;   INCISION AND DRAINAGE OF WOUND Right 11/20/2023   Procedure: IRRIGATION AND DEBRIDEMENT WOUND;  Surgeon: Murrell Drivers, MD;  Location: [facility];  Service: Orthopedics;  Laterality: Right;  IRRIGATION AND DEBRIDEMENT RIGHT HAND   TEE WITHOUT  CARDIOVERSION N/A 03/23/2018   Procedure: TRANSESOPHAGEAL ECHOCARDIOGRAM (TEE);  Surgeon: Mona Vinie BROCKS, MD;  Location: [facility] ENDOSCOPY;  Service: Cardiovascular;  Laterality: N/A;   TEE WITHOUT CARDIOVERSION N/A 03/29/2018   Procedure: TRANSESOPHAGEAL ECHOCARDIOGRAM (TEE);  Surgeon: Lucas Dorise POUR, MD;  Location: [facility] OR;  Service: Open Heart Surgery;  Laterality: N/A;   TRICUSPID VALVE REPLACEMENT N/A 03/29/2018   Procedure: TRICUSPID VALVE REPLACEMENT Using 27 mm Magna Ease Mitral Valve Pericardial Bioprosthesis;  Surgeon: Lucas Dorise POUR, MD;  Location: [facility] OR;  Service: Open Heart Surgery;  Laterality: N/A;   TUBAL LIGATION      Allergies  Allergen Reactions   Gentamicin Rash    Red man syndrome    Vancomycin  Rash    Red man syndrome    Objective/Physical Exam Neurovascular status intact.  Incision well coapted with sutures intact.  There is some edema with very mild localized erythema around the fifth toe of the right foot.   Assessment: 1. s/p extensor tenotomy with MTP capsulotomy fifth digits bilateral. DOS: 02/04/2024   Plan of Care:  -Patient was evaluated. - Percutaneous fixation pins were removed today -Continue WBAT surgical shoe - Sutures were removed no further dehiscence noted.  Skin incision completely epithelialized patient will return to regular shoes no restrictions if any foot and ankle issues on future she will come back and see Dr. Janit

## 2024-03-07 ENCOUNTER — Ambulatory Visit

## 2024-03-07 ENCOUNTER — Other Ambulatory Visit (HOSPITAL_COMMUNITY): Payer: Self-pay

## 2024-03-07 DIAGNOSIS — F900 Attention-deficit hyperactivity disorder, predominantly inattentive type: Secondary | ICD-10-CM | POA: Diagnosis not present

## 2024-03-07 MED ORDER — AMPHETAMINE-DEXTROAMPHET ER 30 MG PO CP24
30.0000 mg | ORAL_CAPSULE | Freq: Two times a day (BID) | ORAL | 0 refills | Status: AC
  Filled 2024-03-23: qty 60, 30d supply, fill #0

## 2024-03-07 MED ORDER — AMPHETAMINE-DEXTROAMPHET ER 30 MG PO CP24
30.0000 mg | ORAL_CAPSULE | Freq: Two times a day (BID) | ORAL | 0 refills | Status: AC
  Filled 2024-07-14: qty 60, 30d supply, fill #0

## 2024-03-07 MED ORDER — AMPHETAMINE-DEXTROAMPHET ER 30 MG PO CP24
30.0000 mg | ORAL_CAPSULE | Freq: Two times a day (BID) | ORAL | 0 refills | Status: AC
  Filled 2024-04-26: qty 60, 30d supply, fill #0

## 2024-03-07 NOTE — Progress Notes (Deleted)
    SUBJECTIVE:   CHIEF COMPLAINT / HPI:   ***  PERTINENT  PMH / PSH: ***  OBJECTIVE:   There were no vitals taken for this visit.  ***  ASSESSMENT/PLAN:   Assessment & Plan Dysuria    Payton Coward, MD [facility]

## 2024-03-08 ENCOUNTER — Ambulatory Visit (INDEPENDENT_AMBULATORY_CARE_PROVIDER_SITE_OTHER): Admitting: Family Medicine

## 2024-03-08 VITALS — BP 100/61 | HR 44 | Ht 68.0 in | Wt 146.4 lb

## 2024-03-08 DIAGNOSIS — R300 Dysuria: Secondary | ICD-10-CM

## 2024-03-08 LAB — POCT UA - MICROSCOPIC ONLY
Epithelial cells, urine per micros: >20
WBC, Ur, HPF, POC: NONE SEEN (ref 0–5)

## 2024-03-08 LAB — POCT URINALYSIS DIP (MANUAL ENTRY)
Bilirubin, UA: NEGATIVE
Glucose, UA: NEGATIVE mg/dL
Ketones, POC UA: NEGATIVE mg/dL
Leukocytes, UA: NEGATIVE
Nitrite, UA: NEGATIVE
Protein Ur, POC: NEGATIVE mg/dL
Spec Grav, UA: 1.02 (ref 1.010–1.025)
Urobilinogen, UA: 0.2 U/dL
pH, UA: 6 (ref 5.0–8.0)

## 2024-03-08 NOTE — Progress Notes (Addendum)
    SUBJECTIVE:   CHIEF COMPLAINT / HPI:   Having dysuria, took some old amoxicillin  for a couple days with some relief, but it came back with a vengeance.   Pain comes and goes every few days, burning with urination and bloody tinge on the toilet paper. She did have an episode back in December where she had a kidney stone.  PERTINENT  PMH / PSH: none  OBJECTIVE:   Ht 5' 8 (1.727 m)   Wt 146 lb 6 oz (66.4 kg)   BMI 22.26 kg/m   General: A&O, NAD Cardiac: RRR, no m/r/g Respiratory: CTAB, normal WOB, no w/c/r GI: Soft, NTTP, non-distended   ASSESSMENT/PLAN:   Assessment & Plan Dysuria -POC UA with urine culture today -Limited ultrasound obtained for learning purposes. Formal testing and follow up as below. Discussed limitations with patient.  -advised patient to avoid bladder irritants like excess caffeine, spicy foods. List provided.  -will order abx based on UA results when available.  -If urine culture negative, will refer to Urology    Tiffany Pinal, DO [facility]

## 2024-03-08 NOTE — Patient Instructions (Addendum)
 It was wonderful to see you today!  We tested your urine to see whether your symptoms could be caused by a UTI. If your test looks suspicious for infection, we will treat with antibiotics. Regardless, I recommend avoiding foods that can irritate the bladder for 2-3 days to help improve symptoms.   Foods that irritate the bladder: -coffee, black tea  -spicy foods -citrus -tomatoes -carbonated beverages -artificial sweeteners like xylitol and aspartame  We did not see any stones on ultrasound today, so we will hold off on further imaging until we get your urine culture results back.  Please call [phone] with any questions about today's appointment.   If you need any additional refills, please call your pharmacy before calling the office.  Lucie Pinal, DO Family Medicine

## 2024-03-10 ENCOUNTER — Ambulatory Visit: Payer: Self-pay | Admitting: Family Medicine

## 2024-03-10 DIAGNOSIS — N029 Recurrent and persistent hematuria with unspecified morphologic changes: Secondary | ICD-10-CM

## 2024-03-10 LAB — URINE CULTURE

## 2024-03-10 NOTE — Progress Notes (Signed)
 Scheduled patient for CT. CT will be on 03/30/2024 @215 

## 2024-03-10 NOTE — Telephone Encounter (Signed)
 Called and spoke with patient regarding lab results. Patient agreeable to CT stone study and referral to urology for further evaluation of hematuria. Will place orders today and forward to MA pool for scheduling.

## 2024-03-23 ENCOUNTER — Other Ambulatory Visit (HOSPITAL_COMMUNITY): Payer: Self-pay

## 2024-03-23 ENCOUNTER — Other Ambulatory Visit: Payer: Self-pay

## 2024-03-25 ENCOUNTER — Encounter: Payer: Self-pay | Admitting: Family Medicine

## 2024-03-30 ENCOUNTER — Ambulatory Visit
Admission: RE | Admit: 2024-03-30 | Discharge: 2024-03-30 | Disposition: A | Discharge status: Home / Self Care | Source: Ambulatory Visit | Attending: Family Medicine

## 2024-03-30 ENCOUNTER — Inpatient Hospital Stay
Admission: RE | Admit: 2024-03-30 | Discharge: 2024-03-30 | Discharge status: Home / Self Care | Source: Ambulatory Visit | Attending: Family Medicine | Admitting: Family Medicine

## 2024-03-30 DIAGNOSIS — R921 Mammographic calcification found on diagnostic imaging of breast: Secondary | ICD-10-CM

## 2024-03-30 DIAGNOSIS — R92333 Mammographic heterogeneous density, bilateral breasts: Secondary | ICD-10-CM | POA: Diagnosis not present

## 2024-03-30 DIAGNOSIS — R928 Other abnormal and inconclusive findings on diagnostic imaging of breast: Secondary | ICD-10-CM | POA: Diagnosis not present

## 2024-03-30 DIAGNOSIS — R319 Hematuria, unspecified: Secondary | ICD-10-CM | POA: Diagnosis not present

## 2024-03-30 DIAGNOSIS — N029 Recurrent and persistent hematuria with unspecified morphologic changes: Secondary | ICD-10-CM

## 2024-03-31 ENCOUNTER — Ambulatory Visit: Payer: Self-pay | Admitting: Family Medicine

## 2024-04-11 ENCOUNTER — Other Ambulatory Visit (HOSPITAL_COMMUNITY): Payer: Self-pay

## 2024-04-12 ENCOUNTER — Ambulatory Visit: Payer: Self-pay | Admitting: Family Medicine

## 2024-04-26 ENCOUNTER — Other Ambulatory Visit: Payer: Self-pay

## 2024-04-26 ENCOUNTER — Other Ambulatory Visit (HOSPITAL_COMMUNITY): Payer: Self-pay

## 2024-05-02 DIAGNOSIS — M9911 Subluxation complex (vertebral) of cervical region: Secondary | ICD-10-CM | POA: Diagnosis not present

## 2024-05-02 DIAGNOSIS — M9915 Subluxation complex (vertebral) of pelvic region: Secondary | ICD-10-CM | POA: Diagnosis not present

## 2024-05-02 DIAGNOSIS — M9913 Subluxation complex (vertebral) of lumbar region: Secondary | ICD-10-CM | POA: Diagnosis not present

## 2024-05-02 DIAGNOSIS — M9914 Subluxation complex (vertebral) of sacral region: Secondary | ICD-10-CM | POA: Diagnosis not present

## 2024-05-11 NOTE — Progress Notes (Incomplete)
    SUBJECTIVE:   CHIEF COMPLAINT: follow up  HPI:   Tiffany Salazar is a 43 y.o.  with history notable for endocarditis, eczema, and recent weight loss  presenting for follow up .   Discussed the use of AI scribe software for clinical note transcription with the patient, who gave verbal consent to proceed.  History of Present Illness      PERTINENT  PMH / PSH/Family/Social History : ***  OBJECTIVE:   There were no vitals taken for this visit.  Today's weight:  Review of prior weights: There were no vitals filed for this visit.  ***  ASSESSMENT/PLAN:   Assessment & Plan     Suzann Daring, MD  Family Medicine Teaching Service  [facility]

## 2024-05-12 ENCOUNTER — Ambulatory Visit: Admitting: Family Medicine

## 2024-05-12 DIAGNOSIS — R634 Abnormal weight loss: Secondary | ICD-10-CM

## 2024-05-12 DIAGNOSIS — Z720 Tobacco use: Secondary | ICD-10-CM

## 2024-05-19 ENCOUNTER — Other Ambulatory Visit (HOSPITAL_COMMUNITY): Payer: Self-pay

## 2024-05-23 ENCOUNTER — Other Ambulatory Visit (HOSPITAL_COMMUNITY): Payer: Self-pay

## 2024-05-27 ENCOUNTER — Other Ambulatory Visit (HOSPITAL_COMMUNITY): Payer: Self-pay

## 2024-05-27 ENCOUNTER — Other Ambulatory Visit: Payer: Self-pay

## 2024-06-06 ENCOUNTER — Other Ambulatory Visit (HOSPITAL_COMMUNITY): Payer: Self-pay

## 2024-06-06 DIAGNOSIS — F900 Attention-deficit hyperactivity disorder, predominantly inattentive type: Secondary | ICD-10-CM | POA: Diagnosis not present

## 2024-06-06 MED ORDER — AMPHETAMINE-DEXTROAMPHET ER 30 MG PO CP24
30.0000 mg | ORAL_CAPSULE | Freq: Two times a day (BID) | ORAL | 0 refills | Status: AC
  Filled 2024-08-15: qty 60, 30d supply, fill #0

## 2024-06-06 MED ORDER — AMPHETAMINE-DEXTROAMPHET ER 30 MG PO CP24
30.0000 mg | ORAL_CAPSULE | Freq: Two times a day (BID) | ORAL | 0 refills | Status: AC
  Filled 2024-09-28: qty 60, 30d supply, fill #0

## 2024-06-23 ENCOUNTER — Encounter

## 2024-06-28 ENCOUNTER — Ambulatory Visit: Admitting: Family Medicine

## 2024-06-28 VITALS — BP 112/73 | HR 68 | Temp 98.4°F | Ht 67.0 in | Wt 142.4 lb

## 2024-06-28 DIAGNOSIS — Z23 Encounter for immunization: Secondary | ICD-10-CM | POA: Diagnosis not present

## 2024-06-28 DIAGNOSIS — J069 Acute upper respiratory infection, unspecified: Secondary | ICD-10-CM | POA: Diagnosis not present

## 2024-06-28 DIAGNOSIS — H6123 Impacted cerumen, bilateral: Secondary | ICD-10-CM

## 2024-06-28 NOTE — Patient Instructions (Addendum)
 Thank you for coming in today! Here is a summary of what we discussed:  - Your symptoms are most likely due to earwax buildup and a viral respiratory infection.  You can use Tylenol  and ibuprofen  for pain or low-grade fever.  It is also important to stay hydrated.  Please let us  know if your symptoms worsen or do not get better in the next several days.  - You can use hydrogen peroxide or Debrox over-the-counter to help with earwax buildup  Please call the clinic at [phone] if your symptoms worsen or you have any concerns.  Best, Dr Adele

## 2024-06-28 NOTE — Progress Notes (Signed)
    SUBJECTIVE:   CHIEF COMPLAINT / HPI:   Ear fullness L > R side Also feels some fullness down her L neck/head Started yesterday, also notes congestion x1 day No fevers Felt she had more earwax than usual when she cleaned her ears the other day Tried some earache drops No other meds  Wants flu and covid vaccines today  PERTINENT  PMH / PSH: reviewed  OBJECTIVE:   BP 112/73   Pulse 68   Temp 98.4 F (36.9 C)   Ht 5' 7 (1.702 m)   Wt 142 lb 6.4 oz (64.6 kg)   SpO2 100%   BMI 22.30 kg/m   General: Awake and conversant, no acute distress HEENT: Normocephalic and atraumatic; mild TTP at left frontal sinus area. No pharyngeal erythema.  Bilateral ear canals with moderate cerumen.  Bilateral TMs appear normal.  Pulm: normal work of breathing on room air Neuro: No focal deficits Psych: Appropriate mood and affect   ASSESSMENT/PLAN:   Assessment & Plan Bilateral impacted cerumen Viral URI No evidence of AOM on exam. Suspect sx related to cerumen impact and likely viral URI. Partial relief with ear lavage today. Advised Deprox/peroxide use. Reviewed return precautions.     Rea Raring, MD [facility]

## 2024-06-29 ENCOUNTER — Ambulatory Visit (HOSPITAL_BASED_OUTPATIENT_CLINIC_OR_DEPARTMENT_OTHER): Admitting: Cardiovascular Disease

## 2024-06-29 NOTE — Progress Notes (Deleted)
 Cardiology Office Note:  .   Date:  06/29/2024  ID:  Tiffany Salazar, DOB November 15, 1980, MRN [id number] PCP: Delores Suzann HERO, MD  [facility] Providers Cardiologist:  Annabella Scarce, MD { Click to update primary MD,subspecialty MD or APP then REFRESH:1}   History of Present Illness: .   Tiffany Salazar is a 43 y.o. 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  Tiffany Salazar was admitted 02/2018 with tricuspid valve endocarditis.  She had a TEE that showed a 0.5 cm x 1 cm vegetation on the septal leaflet of the tricuspid valve.  There was severe tricuspid regurgitation.   She was initially treated at an outside hospital and started on vancomycin  and gentamicin.  However she developed Red Man syndrome.  This was switched to daptomycin  and Bactrim .  Her hospitalization was complicated by septic pulmonary emboli.  She underwent tricuspid valve replacement with a 27 mm Edwards Magna-Ease pericardial valve on 03/29/2018.  Her postoperative course was relatively uncomplicated.   Tiffany Salazar followed up 04/2018 and was doing well from a cardiac standpoint.  At her visit 04/2023 she lost 50 pounds with dietary changes and increased physical activity.  She was doing well from a cardiac standpoint.  Echo 04/2023 revealed LVEF 55-60% with normal RV function.  Mean tricuspid valve gradient was 4 mmHg.       Discussed the use of AI scribe software for clinical note transcription with the patient, who gave verbal consent to proceed.  History of Present Illness     ROS:  As per HPI  Studies Reviewed: .       Echo 05/20/23: 1. Left ventricular ejection fraction, by estimation, is 55 to 60%. Left  ventricular ejection fraction by 3D volume is 58 %. The left ventricle has  normal function. The left ventricle has no regional wall motion  abnormalities. Left ventricular diastolic    parameters were normal.   2. Right ventricular systolic function is normal. The right ventricular  size is normal. There is mildly elevated pulmonary artery systolic  pressure. The estimated right ventricular systolic pressure is 39.8 mmHg.   3. The mitral valve is grossly normal. Trivial mitral valve  regurgitation.   4. Mean gradient 4 mmHg. Peak gradient 25 mmHg. Bioprosthetic valve,  apperas normally functioning. The tricuspid valve is has been  repaired/replaced. Trivial regurgitation.   5. The aortic valve is tricuspid. Aortic valve regurgitation is not  visualized.   6. The inferior vena cava is dilated in size with <50% respiratory  variability, suggesting right atrial pressure of 15 mmHg.   Comparison(s): Changes from prior study are noted. 07/04/2021: LVEF  60-65%, s/p TVR with peak gradient 16 mmHg, mild TR.   *** Risk Assessment/Calculations:   {Does this patient have ATRIAL FIBRILLATION?:[phone]}         Physical Exam:   VS:  There were no vitals taken for this visit. , BMI There is no height or weight on file to calculate BMI. GENERAL:  Well appearing HEENT: Pupils equal round and reactive, fundi not visualized, oral mucosa unremarkable NECK:  No jugular venous distention, waveform within normal limits, carotid upstroke brisk and symmetric, no bruits, no thyromegaly LUNGS:  Clear to auscultation bilaterally HEART:  RRR.  PMI not displaced or sustained,S1 and S2 within normal limits, no S3, no S4, no clicks, no rubs, *** murmurs ABD:  Flat, positive bowel sounds normal in  frequency in pitch, no bruits, no rebound, no guarding, no midline pulsatile mass, no hepatomegaly, no splenomegaly EXT:  2 plus pulses throughout, no edema, no cyanosis no clubbing SKIN:  No rashes no nodules NEURO:  Cranial nerves II through XII grossly intact, motor grossly intact throughout PSYCH:  Cognitively intact, oriented to person place and time   ASSESSMENT AND PLAN: .    *** Assessment and Plan Assessment & Plan         {Are you ordering a CV Procedure (e.g. stress test, cath, DCCV, TEE, etc)?   Press F2        :[phone]}  Dispo: ***  Signed, Annabella Scarce, MD

## 2024-07-14 ENCOUNTER — Other Ambulatory Visit (HOSPITAL_COMMUNITY): Payer: Self-pay

## 2024-07-26 ENCOUNTER — Other Ambulatory Visit (HOSPITAL_COMMUNITY): Payer: Self-pay

## 2024-08-15 ENCOUNTER — Other Ambulatory Visit (HOSPITAL_COMMUNITY): Payer: Self-pay

## 2024-09-28 ENCOUNTER — Other Ambulatory Visit (HOSPITAL_COMMUNITY): Payer: Self-pay

## 2024-09-29 ENCOUNTER — Other Ambulatory Visit (HOSPITAL_COMMUNITY): Payer: Self-pay
